# Patient Record
Sex: Female | Born: 1970 | Race: Black or African American | Hispanic: No | Smoking: Former smoker
Health system: Southern US, Community
[De-identification: ages and names within clinical notes are randomized; demographics above are authoritative.]

## PROBLEM LIST (undated history)

## (undated) DIAGNOSIS — E785 Hyperlipidemia, unspecified: Secondary | ICD-10-CM

## (undated) DIAGNOSIS — Z801 Family history of malignant neoplasm of trachea, bronchus and lung: Secondary | ICD-10-CM

## (undated) DIAGNOSIS — R002 Palpitations: Secondary | ICD-10-CM

## (undated) DIAGNOSIS — C50919 Malignant neoplasm of unspecified site of unspecified female breast: Secondary | ICD-10-CM

## (undated) DIAGNOSIS — Z8669 Personal history of other diseases of the nervous system and sense organs: Secondary | ICD-10-CM

## (undated) DIAGNOSIS — Z800 Family history of malignant neoplasm of digestive organs: Secondary | ICD-10-CM

## (undated) DIAGNOSIS — Z803 Family history of malignant neoplasm of breast: Secondary | ICD-10-CM

## (undated) DIAGNOSIS — E669 Obesity, unspecified: Secondary | ICD-10-CM

## (undated) DIAGNOSIS — F32A Depression, unspecified: Secondary | ICD-10-CM

## (undated) DIAGNOSIS — M1990 Unspecified osteoarthritis, unspecified site: Secondary | ICD-10-CM

## (undated) DIAGNOSIS — C50911 Malignant neoplasm of unspecified site of right female breast: Secondary | ICD-10-CM

## (undated) DIAGNOSIS — Z8489 Family history of other specified conditions: Secondary | ICD-10-CM

## (undated) DIAGNOSIS — E119 Type 2 diabetes mellitus without complications: Secondary | ICD-10-CM

## (undated) DIAGNOSIS — T7840XA Allergy, unspecified, initial encounter: Secondary | ICD-10-CM

## (undated) DIAGNOSIS — D649 Anemia, unspecified: Secondary | ICD-10-CM

## (undated) HISTORY — DX: Family history of malignant neoplasm of breast: Z80.3

## (undated) HISTORY — DX: Hyperlipidemia, unspecified: E78.5

## (undated) HISTORY — DX: Allergy, unspecified, initial encounter: T78.40XA

## (undated) HISTORY — DX: Depression, unspecified: F32.A

## (undated) HISTORY — DX: Family history of malignant neoplasm of trachea, bronchus and lung: Z80.1

## (undated) HISTORY — DX: Anemia, unspecified: D64.9

## (undated) HISTORY — DX: Unspecified osteoarthritis, unspecified site: M19.90

## (undated) HISTORY — PX: TUBAL LIGATION: SHX77

## (undated) HISTORY — PX: EYE SURGERY: SHX253

## (undated) HISTORY — DX: Obesity, unspecified: E66.9

## (undated) HISTORY — DX: Malignant neoplasm of unspecified site of unspecified female breast: C50.919

## (undated) HISTORY — DX: Malignant neoplasm of unspecified site of right female breast: C50.911

## (undated) HISTORY — DX: Family history of malignant neoplasm of digestive organs: Z80.0

---

## 1998-05-18 ENCOUNTER — Emergency Department (HOSPITAL_COMMUNITY): Admission: EM | Admit: 1998-05-18 | Discharge: 1998-05-18 | Payer: Self-pay | Admitting: Emergency Medicine

## 1998-07-30 ENCOUNTER — Emergency Department (HOSPITAL_COMMUNITY): Admission: EM | Admit: 1998-07-30 | Discharge: 1998-07-30 | Payer: Self-pay | Admitting: Emergency Medicine

## 1998-09-03 ENCOUNTER — Encounter: Admission: RE | Admit: 1998-09-03 | Discharge: 1998-09-03 | Payer: Self-pay | Admitting: Family Medicine

## 1998-09-15 ENCOUNTER — Encounter: Admission: RE | Admit: 1998-09-15 | Discharge: 1998-09-15 | Payer: Self-pay | Admitting: Family Medicine

## 1998-09-22 ENCOUNTER — Encounter: Admission: RE | Admit: 1998-09-22 | Discharge: 1998-09-22 | Payer: Self-pay | Admitting: Sports Medicine

## 1998-09-28 ENCOUNTER — Encounter: Admission: RE | Admit: 1998-09-28 | Discharge: 1998-09-28 | Payer: Self-pay | Admitting: Sports Medicine

## 1998-10-15 ENCOUNTER — Encounter: Admission: RE | Admit: 1998-10-15 | Discharge: 1998-10-15 | Payer: Self-pay | Admitting: Family Medicine

## 1998-12-06 ENCOUNTER — Encounter: Admission: RE | Admit: 1998-12-06 | Discharge: 1998-12-06 | Payer: Self-pay | Admitting: Family Medicine

## 1999-05-03 ENCOUNTER — Encounter: Admission: RE | Admit: 1999-05-03 | Discharge: 1999-05-03 | Payer: Self-pay | Admitting: Family Medicine

## 1999-05-07 ENCOUNTER — Inpatient Hospital Stay (HOSPITAL_COMMUNITY): Admission: AD | Admit: 1999-05-07 | Discharge: 1999-05-07 | Payer: Self-pay | Admitting: Obstetrics

## 1999-05-09 ENCOUNTER — Encounter: Admission: RE | Admit: 1999-05-09 | Discharge: 1999-05-09 | Payer: Self-pay | Admitting: Family Medicine

## 1999-05-16 ENCOUNTER — Encounter: Admission: RE | Admit: 1999-05-16 | Discharge: 1999-05-16 | Payer: Self-pay | Admitting: Family Medicine

## 1999-05-16 ENCOUNTER — Other Ambulatory Visit: Admission: RE | Admit: 1999-05-16 | Discharge: 1999-05-16 | Payer: Self-pay | Admitting: Family Medicine

## 1999-05-25 ENCOUNTER — Inpatient Hospital Stay (HOSPITAL_COMMUNITY): Admission: AD | Admit: 1999-05-25 | Discharge: 1999-05-25 | Payer: Self-pay | Admitting: Obstetrics

## 1999-06-06 ENCOUNTER — Ambulatory Visit (HOSPITAL_COMMUNITY): Admission: RE | Admit: 1999-06-06 | Discharge: 1999-06-06 | Payer: Self-pay | Admitting: Family Medicine

## 1999-08-29 ENCOUNTER — Inpatient Hospital Stay (HOSPITAL_COMMUNITY): Admission: AD | Admit: 1999-08-29 | Discharge: 1999-08-29 | Payer: Self-pay | Admitting: Obstetrics

## 1999-11-13 ENCOUNTER — Inpatient Hospital Stay (HOSPITAL_COMMUNITY): Admission: AD | Admit: 1999-11-13 | Discharge: 1999-11-13 | Payer: Self-pay | Admitting: Obstetrics

## 1999-11-14 ENCOUNTER — Inpatient Hospital Stay (HOSPITAL_COMMUNITY): Admission: AD | Admit: 1999-11-14 | Discharge: 1999-11-16 | Payer: Self-pay | Admitting: Obstetrics

## 1999-11-14 ENCOUNTER — Encounter (INDEPENDENT_AMBULATORY_CARE_PROVIDER_SITE_OTHER): Payer: Self-pay | Admitting: Specialist

## 2002-07-10 ENCOUNTER — Emergency Department (HOSPITAL_COMMUNITY): Admission: EM | Admit: 2002-07-10 | Discharge: 2002-07-10 | Payer: Self-pay | Admitting: Emergency Medicine

## 2002-07-11 ENCOUNTER — Emergency Department (HOSPITAL_COMMUNITY): Admission: EM | Admit: 2002-07-11 | Discharge: 2002-07-11 | Payer: Self-pay | Admitting: Emergency Medicine

## 2004-08-09 ENCOUNTER — Emergency Department (HOSPITAL_COMMUNITY): Admission: EM | Admit: 2004-08-09 | Discharge: 2004-08-09 | Payer: Self-pay | Admitting: Emergency Medicine

## 2005-10-10 ENCOUNTER — Emergency Department (HOSPITAL_COMMUNITY): Admission: EM | Admit: 2005-10-10 | Discharge: 2005-10-11 | Payer: Self-pay | Admitting: Emergency Medicine

## 2005-10-10 IMAGING — CT CT HEAD W/O CM
1 series · 16 of 30 positions shown, 20 images · IV contrast (agent unspecified)
Comparison: None.

CLINICAL DATA: 34-year-old, headaches, dizziness, right-sided numbness for 4 days. 
 HEAD CT WITHOUT CONTRAST:
TECHNIQUE: Contiguous axial images were obtained from the base of the skull through the vertex according to standard protocol without contrast.

[Series 2: head_seq 4.5 h45s st · axial · 0.43mm/px · z∈[+1015,+1141]mm · 16 of 32 slices shown, 20 images]
[im 2/32  brain]
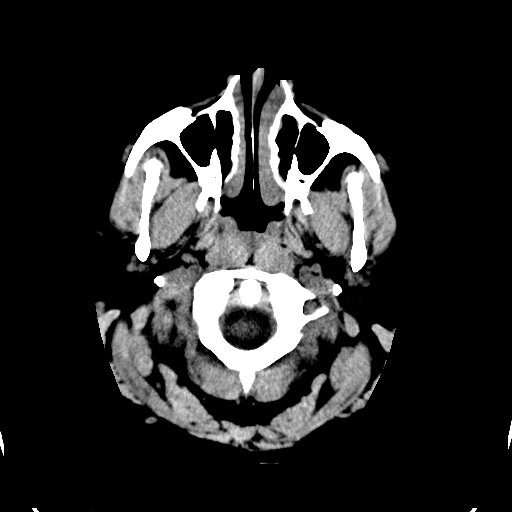
[im 2/32  bone]
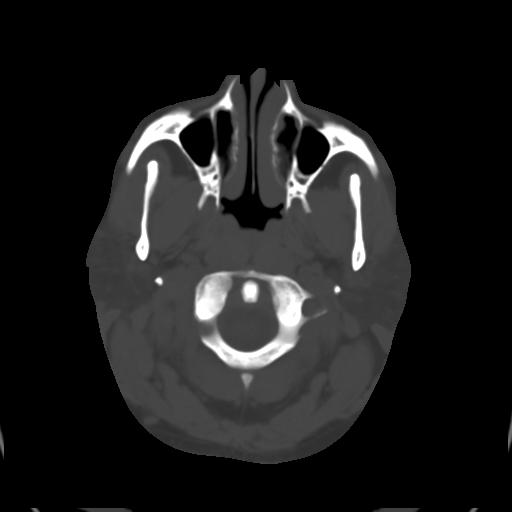
[im 4/32  brain]
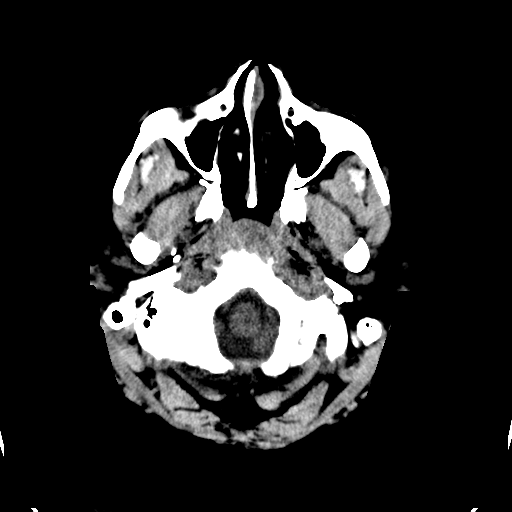
[im 6/32  brain]
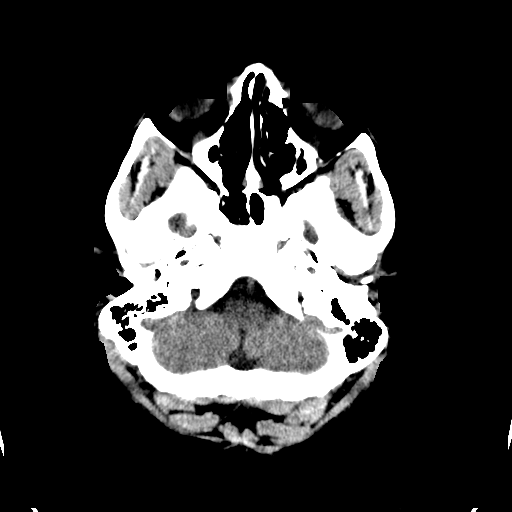
[im 8/32  brain]
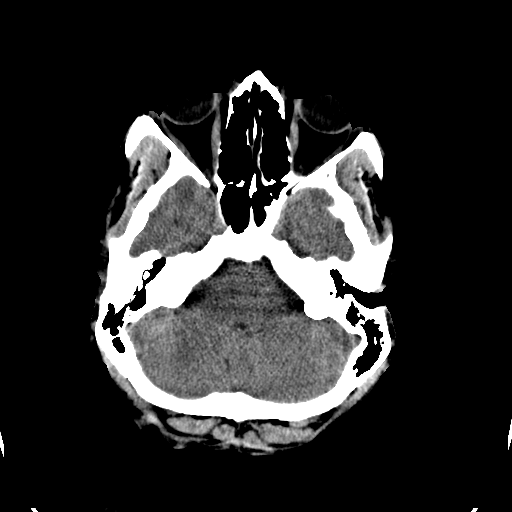
[im 9/32  brain]
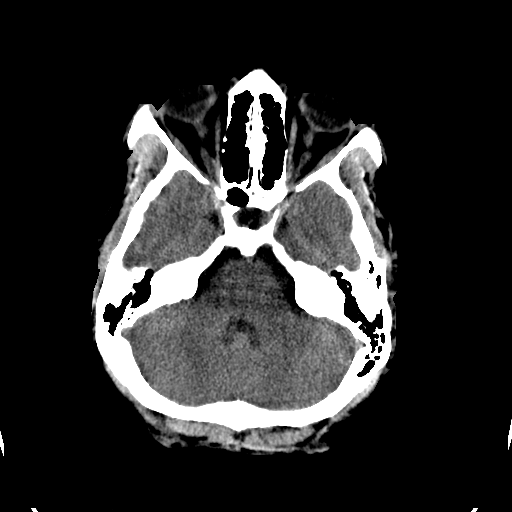
[im 9/32  bone]
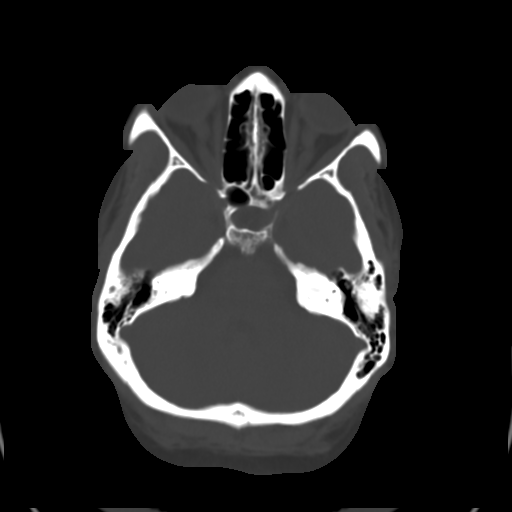
[im 11/32  brain]
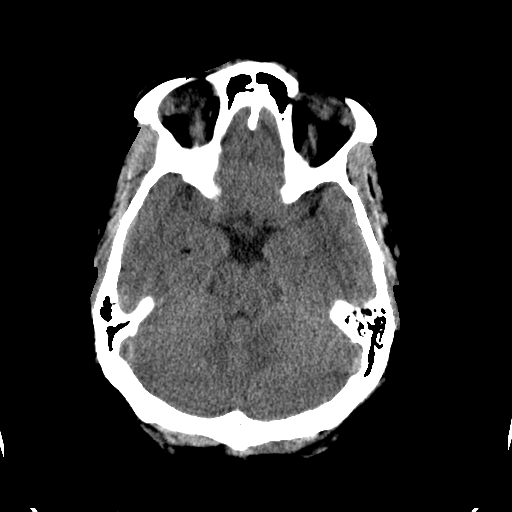
[im 13/32  brain]
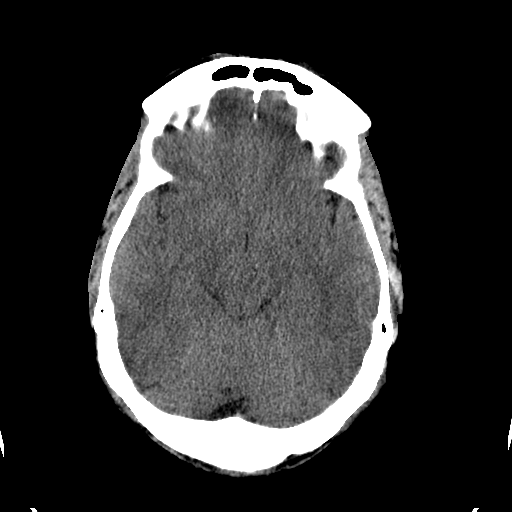
[im 15/32  brain]
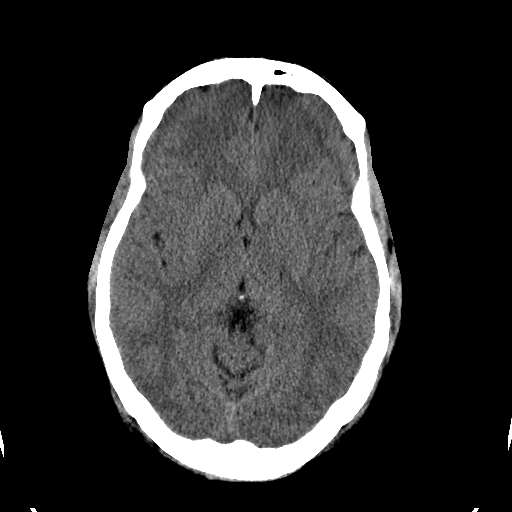
[im 17/32  brain]
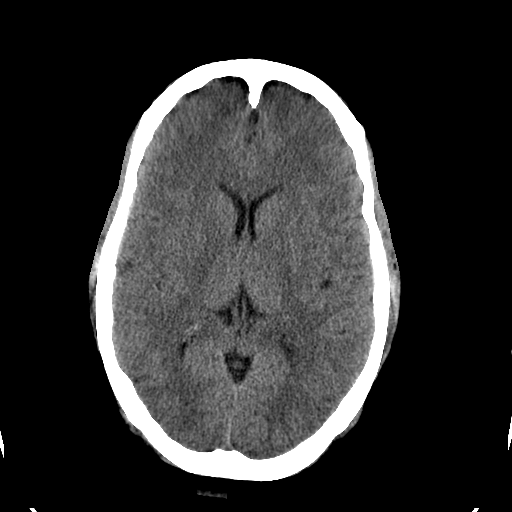
[im 17/32  bone]
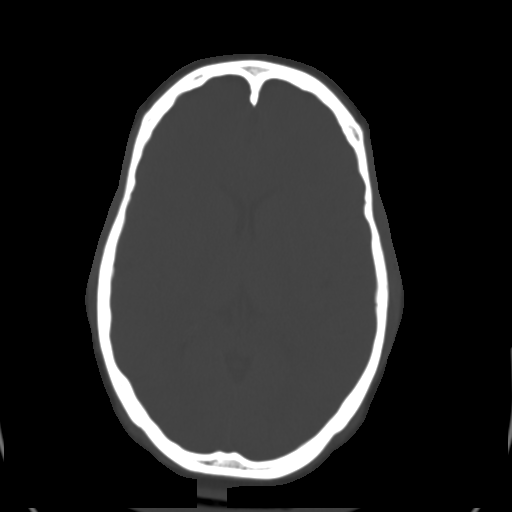
[im 19/32  brain]
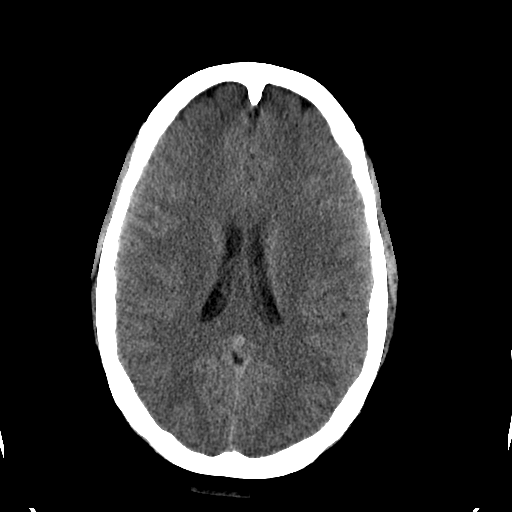
[im 21/32  brain]
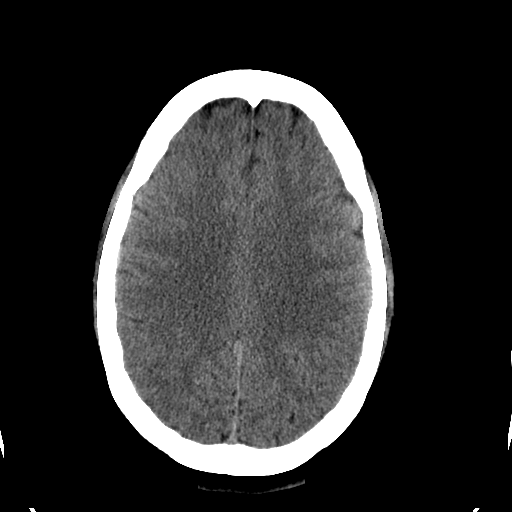
[im 23/32  brain]
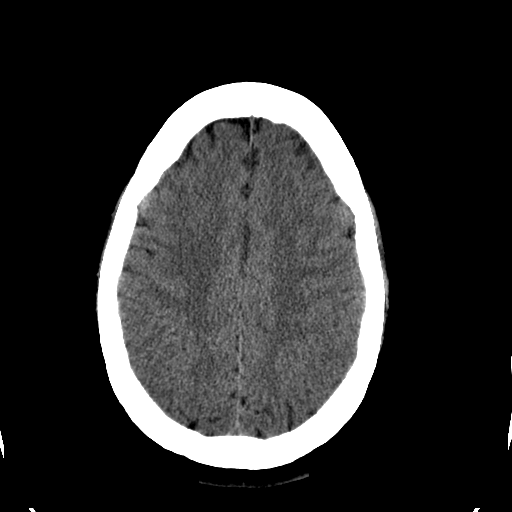
[im 24/32  brain]
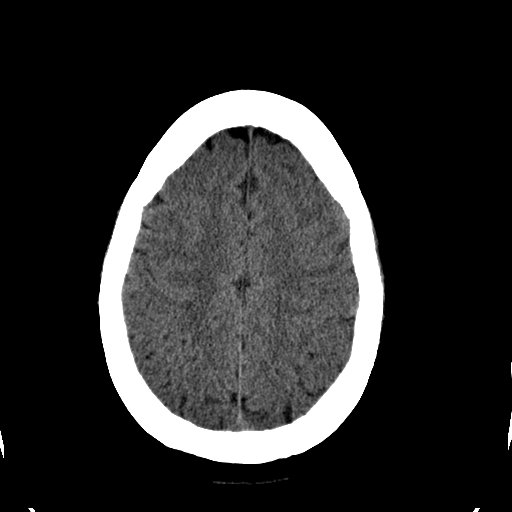
[im 24/32  bone]
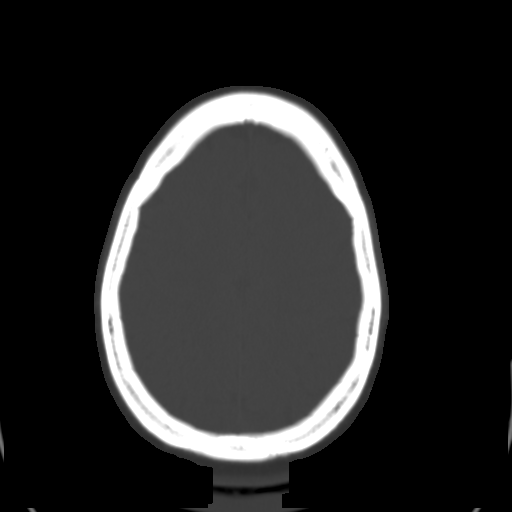
[im 26/32  brain]
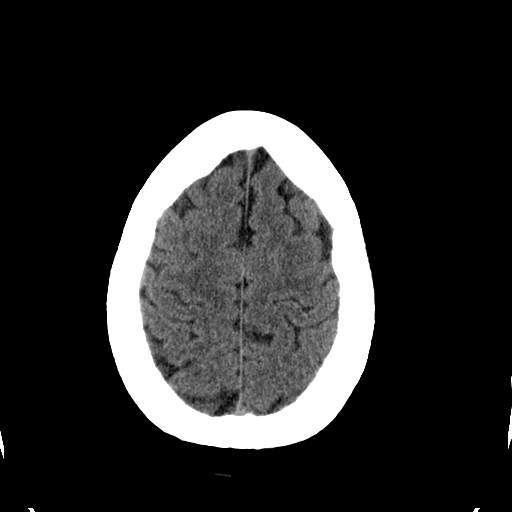
[im 28/32  brain]
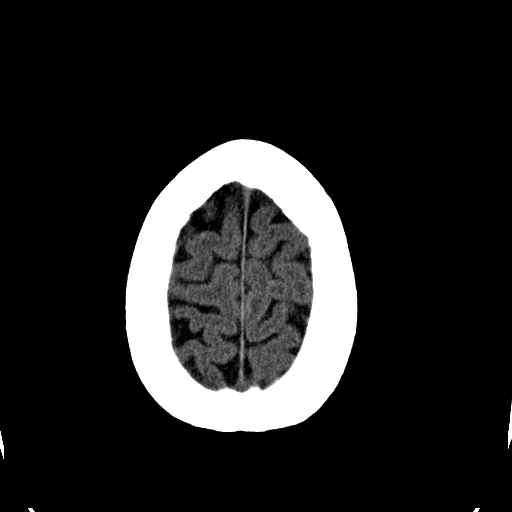
[im 30/32  brain]
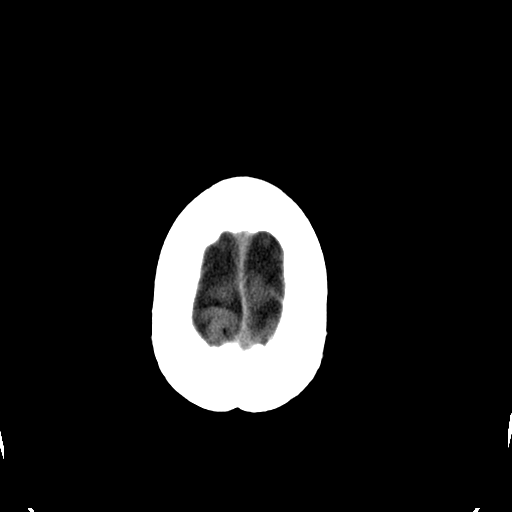

[16 of 30 positions shown; findings below may reference images not displayed]

There is no evidence of intracranial hemorrhage, brain edema, or mass effect.  No other intra-axial abnormalities are seen, and the ventricles are within normal limits.  No abnormal extra-axial fluid collections or masses are identified.  No skull abnormalities are noted.
IMPRESSION: Negative non-contrast head CT.

## 2007-05-07 ENCOUNTER — Emergency Department (HOSPITAL_COMMUNITY): Admission: EM | Admit: 2007-05-07 | Discharge: 2007-05-07 | Payer: Self-pay | Admitting: Emergency Medicine

## 2007-10-14 ENCOUNTER — Emergency Department (HOSPITAL_COMMUNITY): Admission: EM | Admit: 2007-10-14 | Discharge: 2007-10-14 | Payer: Self-pay | Admitting: Emergency Medicine

## 2007-10-14 IMAGING — CT CT ANGIO CHEST
2 of 4 series · 19 of 36 positions shown · IV contrast (agent unspecified)
Comparison: [DATE]

CLINICAL DATA: Chest pain.  Back pain.  Elevated D-dimer.

CT ANGIOGRAPHY CHEST
TECHNIQUE: Multidetector CT imaging of the chest using the
standard protocol during bolus administration of intravenous
contrast. Multiplanar reconstructed images obtained and reviewed to
evaluate the vascular anatomy.
Contrast: 80 ml [4C]

[Series 8: pe 1.0 b40f thins for pacs · axial · 0.57mm/px · z∈[+1174,+1356]mm · 16 of 205 slices shown]
[im 11/205  lung]
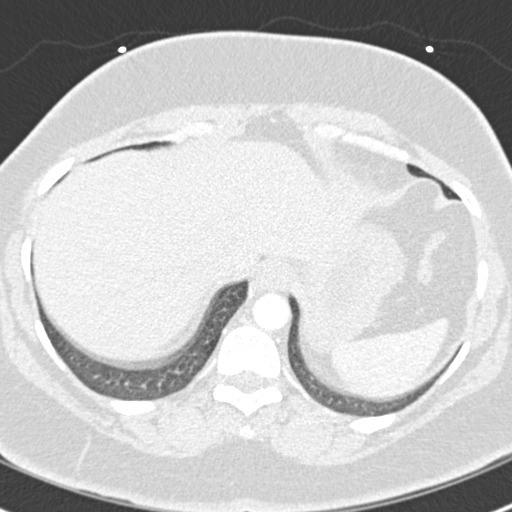
[im 21/205  mediastinal]
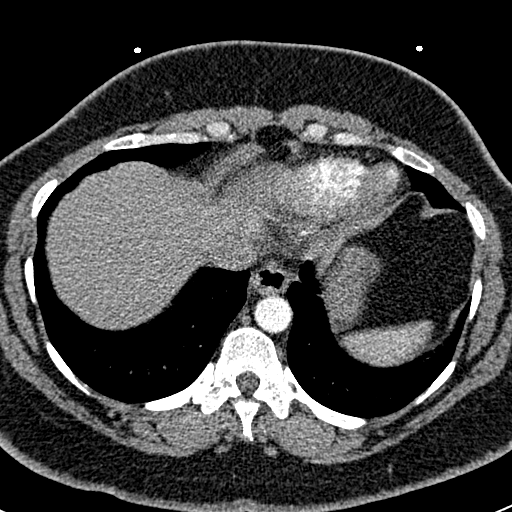
[im 31/205  lung]
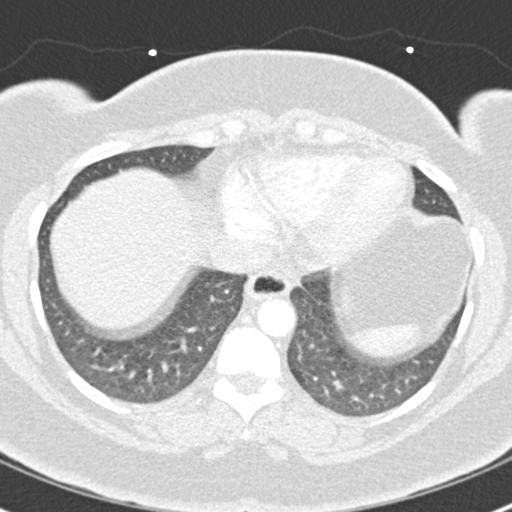
[im 52/205  mediastinal]
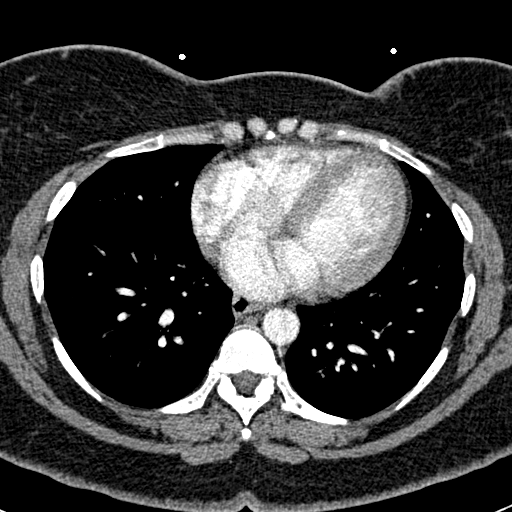
[im 62/205  lung]
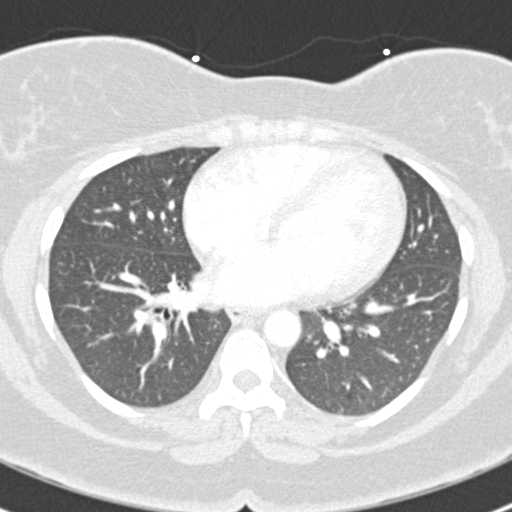
[im 72/205  mediastinal]
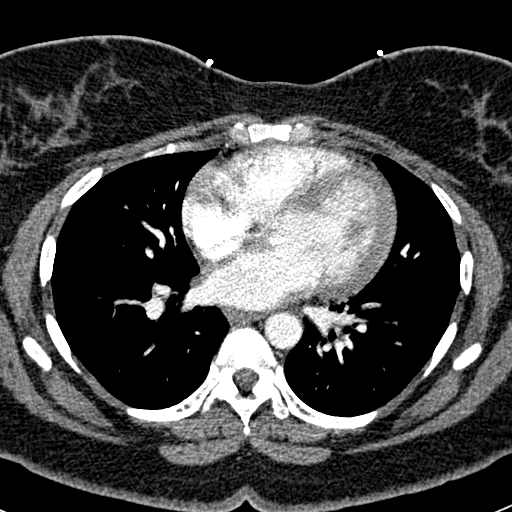
[im 82/205  lung]
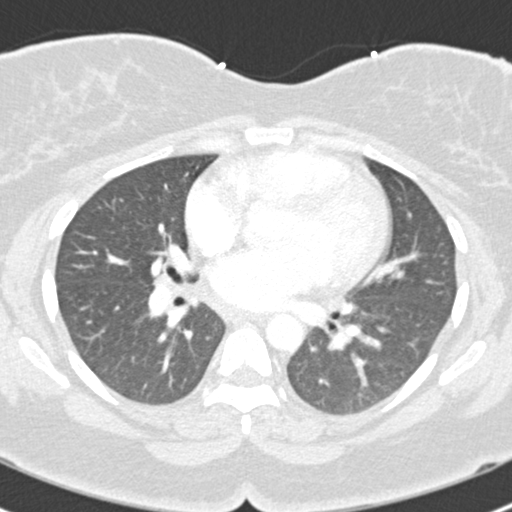
[im 92/205  mediastinal]
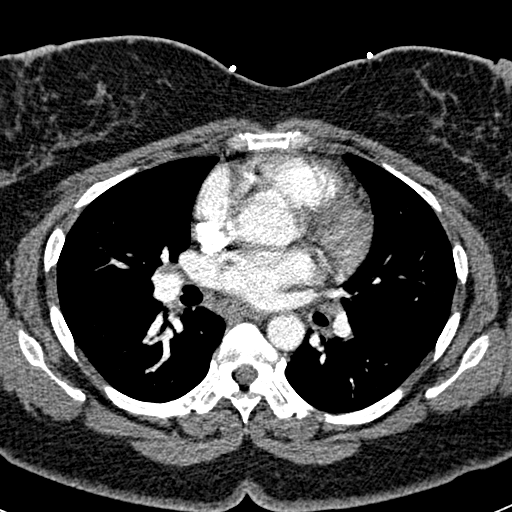
[im 113/205  lung]
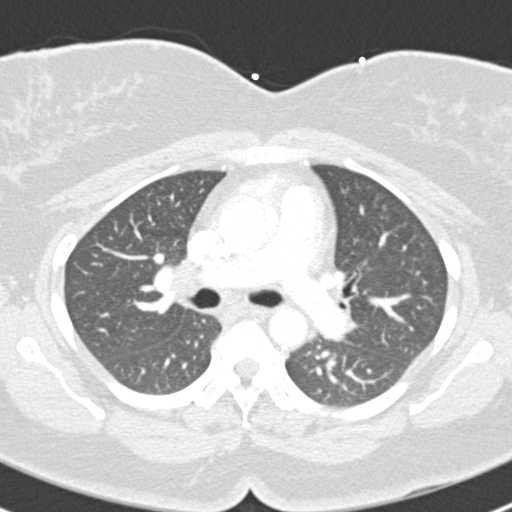
[im 123/205  mediastinal]
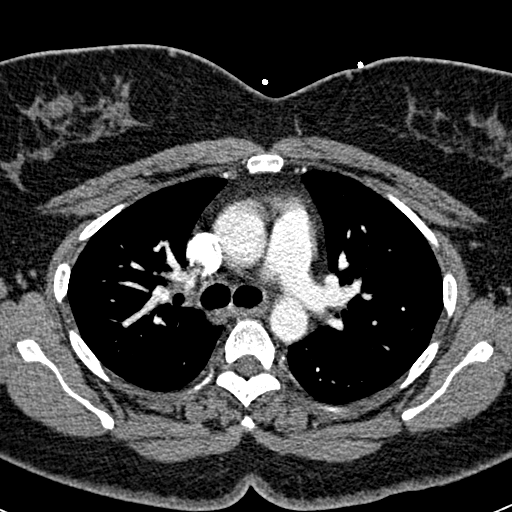
[im 133/205  lung]
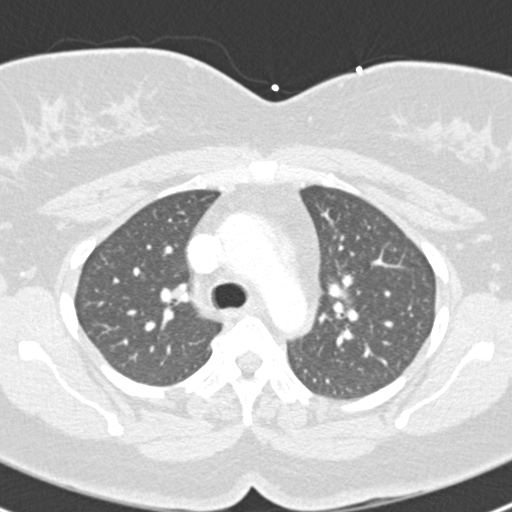
[im 143/205  mediastinal]
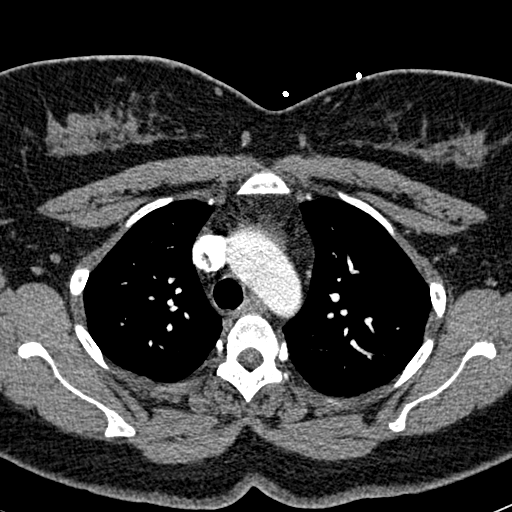
[im 154/205  lung]
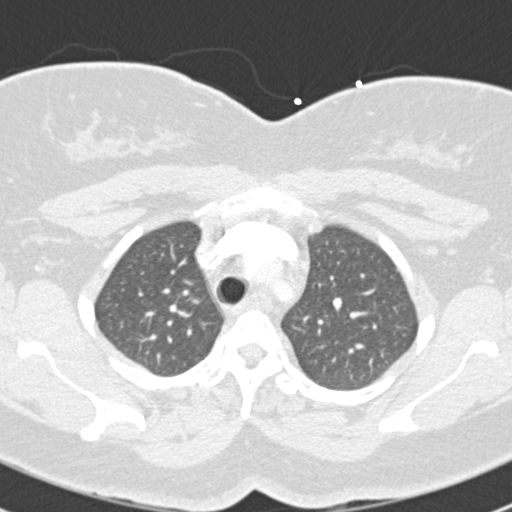
[im 174/205  mediastinal]
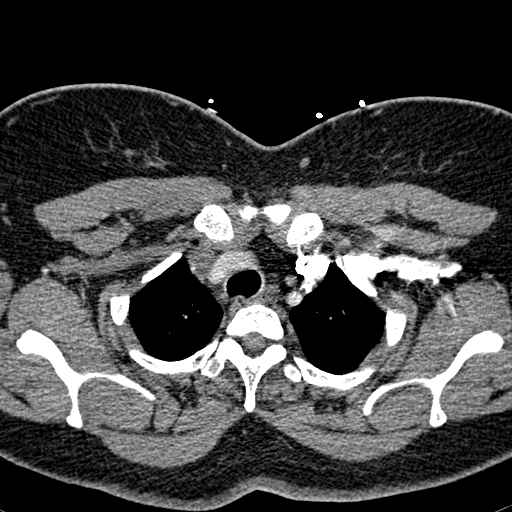
[im 184/205  lung]
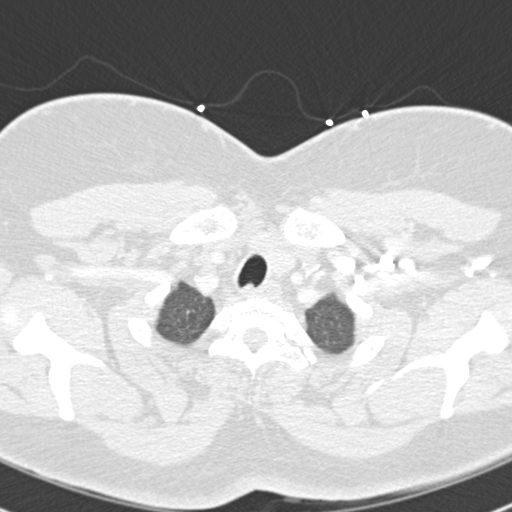
[im 194/205  mediastinal]
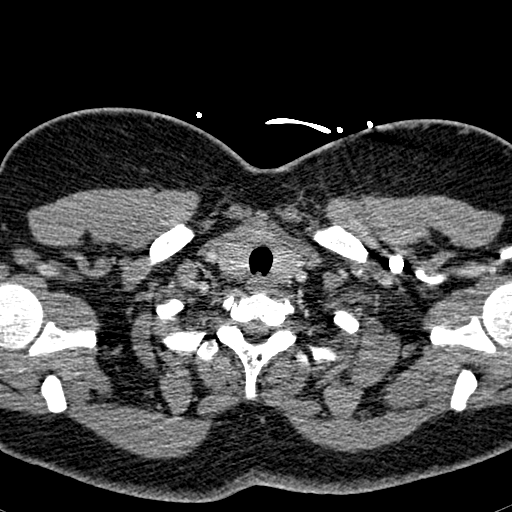

[Series 602: <mpr thick range> · coronal · 0.57mm/px · 3 of 83 slices shown]
[im 17/83  mediastinal]
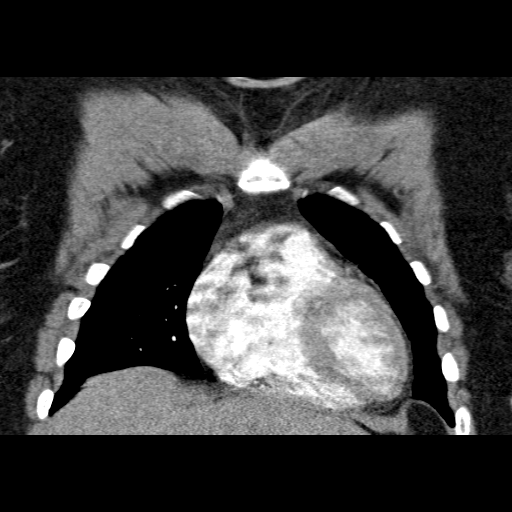
[im 33/83  mediastinal]
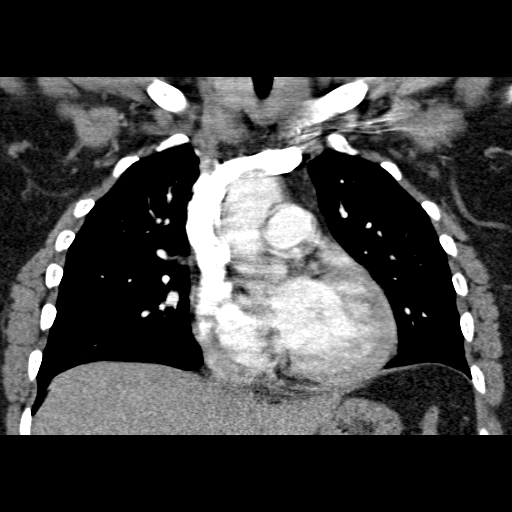
[im 50/83  mediastinal]
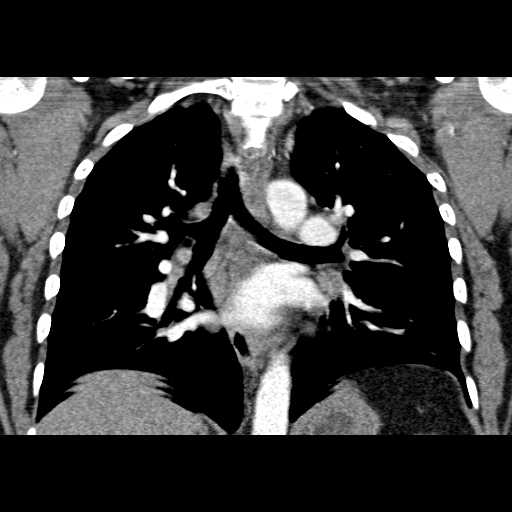

[19 of 36 positions shown; findings below may reference images not displayed]

FINDINGS: No filling defect is identified in the pulmonary arterial
tree to suggest pulmonary embolus.  The thoracic inlet appears
unremarkable.  No pathologic adenopathy identified.  The lungs
appear otherwise clear.  The thoracic spine appears unremarkable.
The sternum appears normal.  The thoracic aorta and branch vessels
appear unremarkable.
IMPRESSION: 1.  No embolus identified.

## 2007-10-14 IMAGING — CR DG CHEST 2V
2 series · 2 of 2 positions shown · non-contrast
Comparison: None

CLINICAL DATA: *Chest pain;

CHEST - 2 VIEW

[w chest pa]
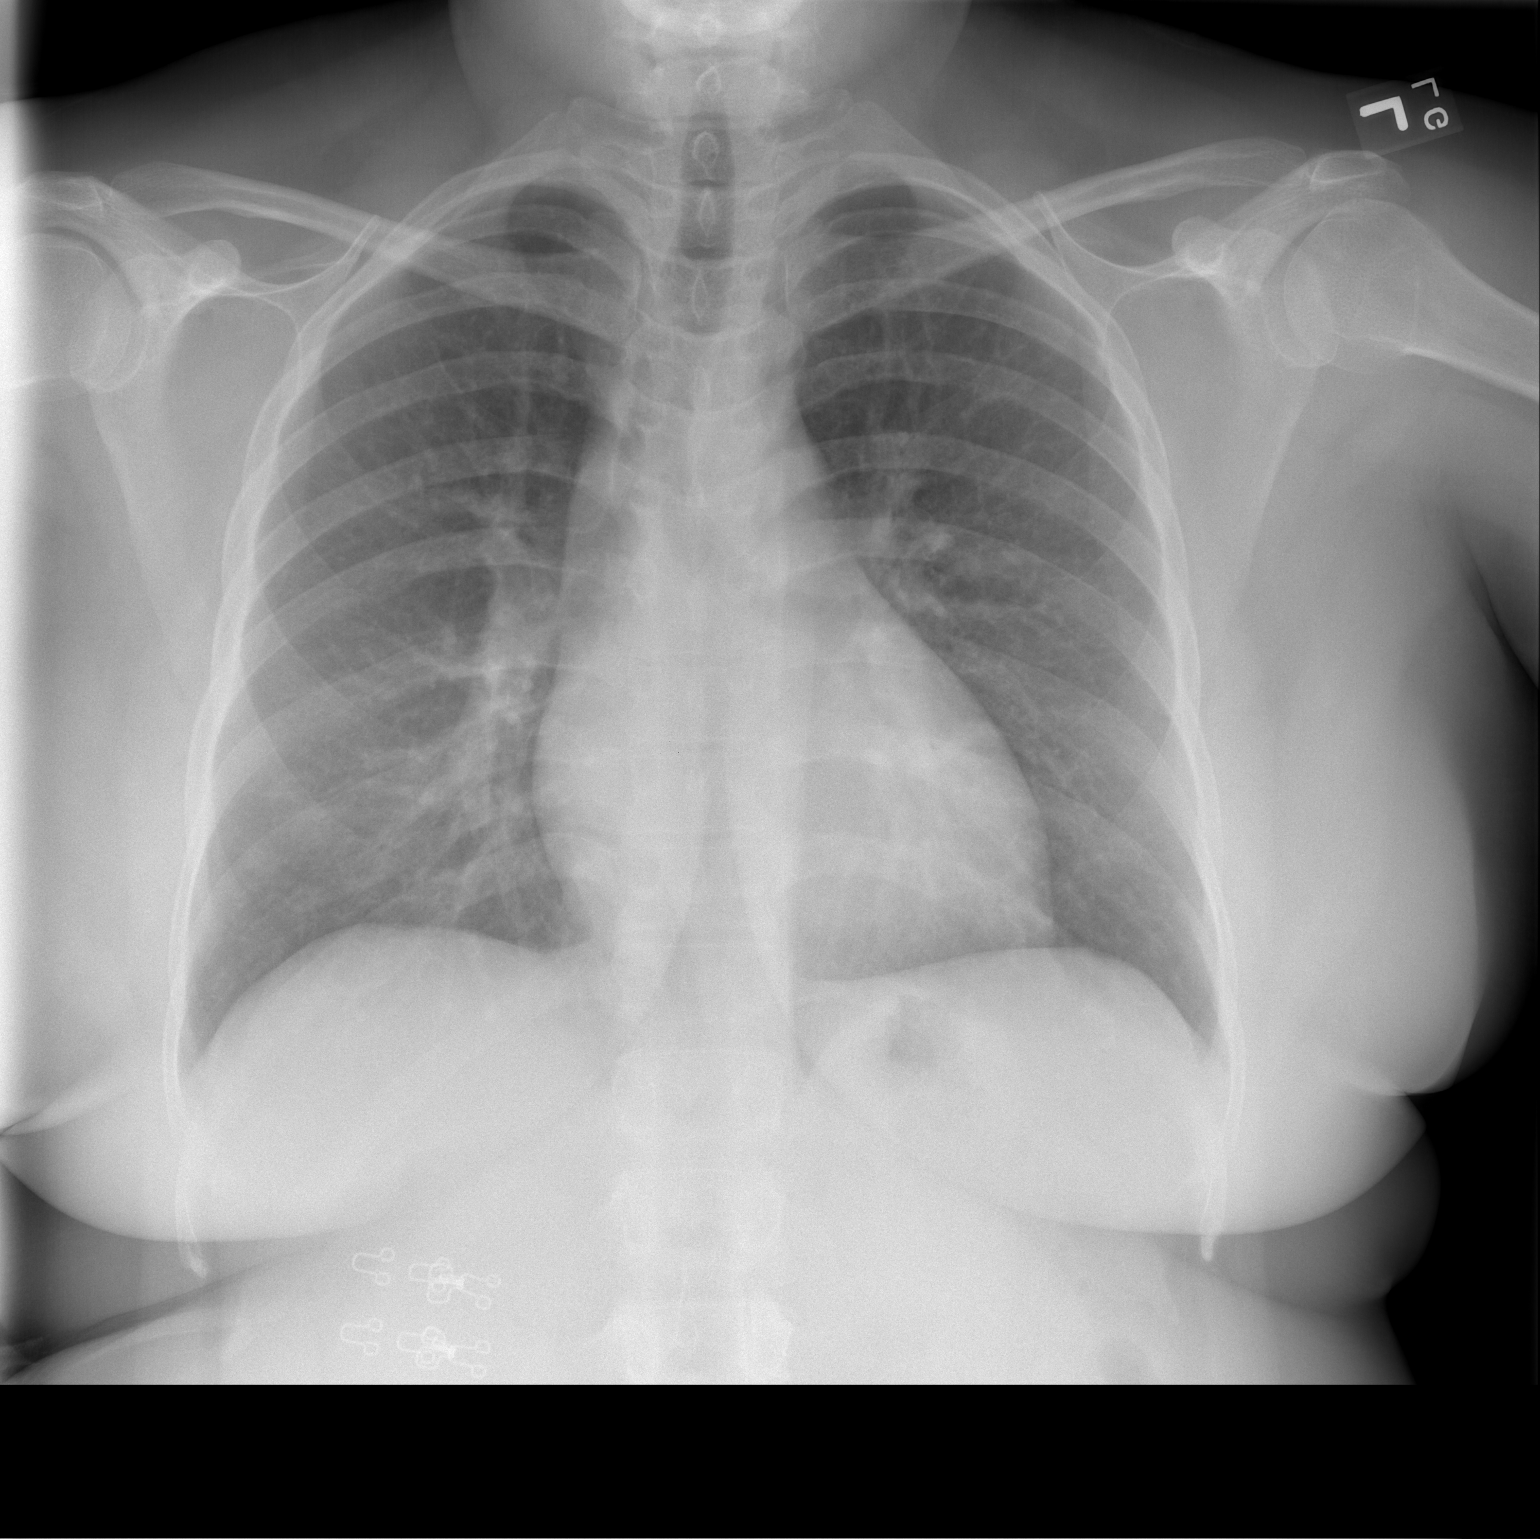

[w chest lat]
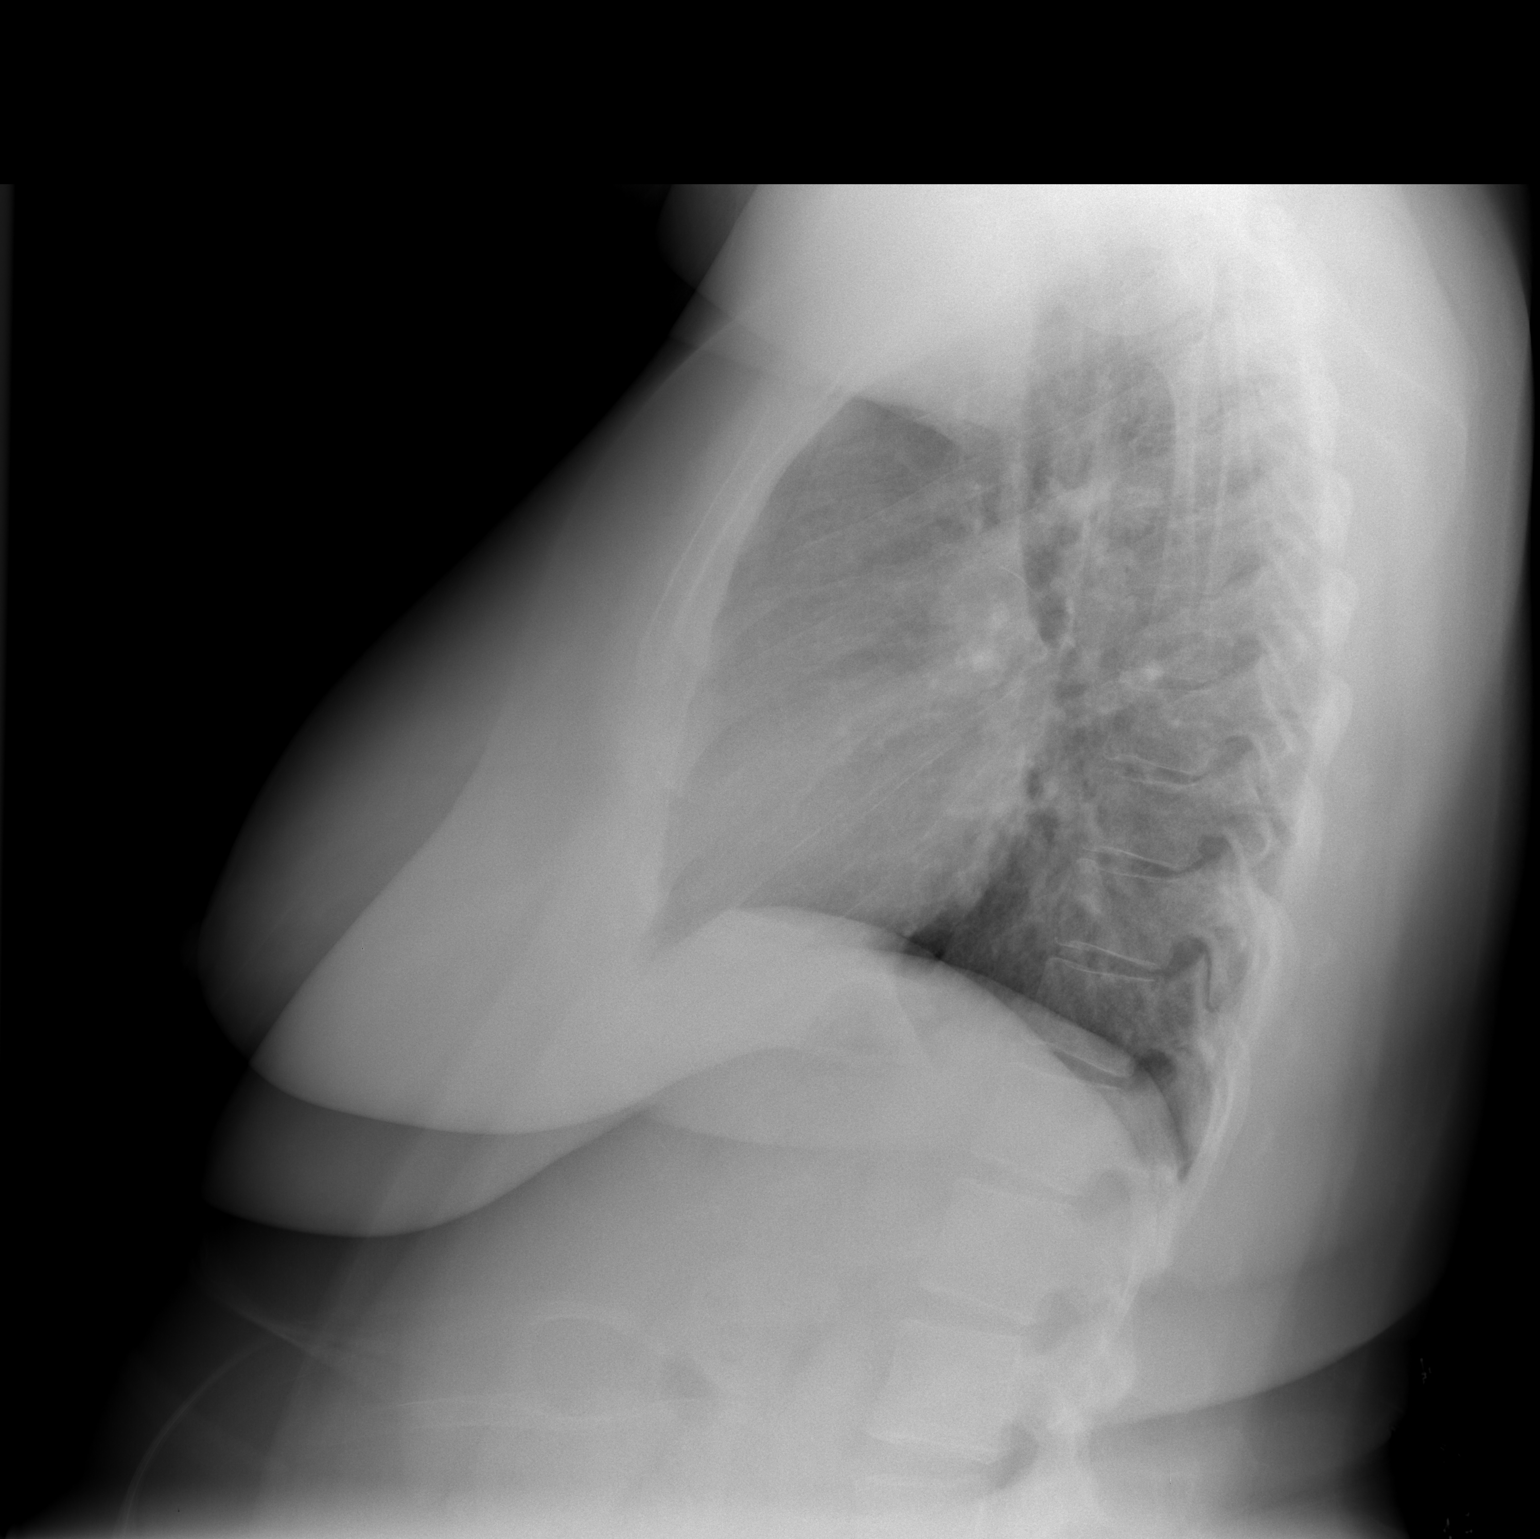

[2 of 2 positions shown; findings below may reference images not displayed]

FINDINGS: The heart size and mediastinal contours are within normal
limits.  Both lungs are clear.  The visualized skeletal structures
are unremarkable.
IMPRESSION: No active cardiopulmonary disease.

## 2008-12-18 ENCOUNTER — Emergency Department (HOSPITAL_COMMUNITY): Admission: EM | Admit: 2008-12-18 | Discharge: 2008-12-18 | Payer: Self-pay | Admitting: Family Medicine

## 2010-10-24 LAB — POCT URINALYSIS DIP (DEVICE)
Glucose, UA: NEGATIVE mg/dL
Hgb urine dipstick: NEGATIVE
Ketones, ur: NEGATIVE mg/dL
Protein, ur: 30 mg/dL — AB
Specific Gravity, Urine: >=1.03 (ref 1.005–1.030)
pH: 5 (ref 5.0–8.0)

## 2010-12-02 NOTE — Discharge Summary (Signed)
[facility]  Patient:    Sandra Bishop, Sandra Bishop                       MRN: [id number] Adm. Date:  [id number] Disc. Date: [id number] Attending:  Venita Sheffield                           Discharge Summary  HISTORY OF PRESENT ILLNESS:   The patient is a 40 year old, gravida 3, para 1-0-1-1, [facility] Dec 11, 1999.  She was admitted in labor at 5 cm dilated, 90%, with vertex at 0 station.  She had not had her Beta Strep done and missed her last appointment.  HOSPITAL COURSE:              Ampicillin 2 grams IV q.6h. was given.  She progressed rapidly and had a normal vaginal delivery of a female, weighing 4 pounds 11 ounces.  The team was in attendance.  There was a nuchal cord x 1. Postpartum, the patient desired sterilization and on May 1, she underwent postpartum tubal ligation.  Her hemoglobin on April 30, was 12.3 and on May 1, reported as 12.4.  Postoperative she did well and was discharged home on the second postpartum day  ambulatory, on a regular diet, on Tylenol No.3 one q.4h. p.r.n. for pain.  DISCHARGE DIAGNOSIS:          Status post normal vaginal delivery at 36 weeks. DD:  11/16/99 TD:  11/17/99 Job: [id number]

## 2011-04-10 LAB — CBC
HCT: 35.8 — ABNORMAL LOW
MCHC: 34.4
MCV: 83.9
RBC: 4.27
WBC: 10.9 — ABNORMAL HIGH

## 2011-04-10 LAB — URINALYSIS, ROUTINE W REFLEX MICROSCOPIC
Glucose, UA: NEGATIVE
Ketones, ur: NEGATIVE
Nitrite: NEGATIVE
Protein, ur: NEGATIVE
Urobilinogen, UA: 0.2

## 2011-04-10 LAB — POCT CARDIAC MARKERS
CKMB, poc: 1.1
Troponin i, poc: <0.05

## 2011-04-10 LAB — DIFFERENTIAL
Eosinophils Absolute: 0.2
Monocytes Relative: 9

## 2011-04-10 LAB — D-DIMER, QUANTITATIVE: D-Dimer, Quant: 0.72 — ABNORMAL HIGH

## 2011-04-10 LAB — URINE MICROSCOPIC-ADD ON

## 2011-04-26 LAB — POCT RAPID STREP A: Streptococcus, Group A Screen (Direct): POSITIVE — AB

## 2011-08-30 ENCOUNTER — Ambulatory Visit (INDEPENDENT_AMBULATORY_CARE_PROVIDER_SITE_OTHER): Payer: BC Managed Care – PPO | Admitting: Internal Medicine

## 2011-08-30 ENCOUNTER — Ambulatory Visit: Payer: BC Managed Care – PPO

## 2011-08-30 DIAGNOSIS — R1012 Left upper quadrant pain: Secondary | ICD-10-CM

## 2011-08-30 DIAGNOSIS — K5900 Constipation, unspecified: Secondary | ICD-10-CM | POA: Insufficient documentation

## 2011-08-30 DIAGNOSIS — R1013 Epigastric pain: Secondary | ICD-10-CM

## 2011-08-30 LAB — COMPREHENSIVE METABOLIC PANEL
ALT: 13 U/L (ref 0–35)
Albumin: 4.2 g/dL (ref 3.5–5.2)
Calcium: 9.2 mg/dL (ref 8.4–10.5)
Chloride: 107 mEq/L (ref 96–112)
Glucose, Bld: 89 mg/dL (ref 70–99)
Potassium: 4.2 mEq/L (ref 3.5–5.3)
Sodium: 140 mEq/L (ref 135–145)
Total Bilirubin: 0.4 mg/dL (ref 0.3–1.2)
Total Protein: 7.8 g/dL (ref 6.0–8.3)

## 2011-08-30 LAB — POCT CBC
MCH, POC: 26 pg — AB (ref 27–31.2)
MID (cbc): 0.5 (ref 0–0.9)
MPV: 8.7 fL (ref 0–99.8)
POC Granulocyte: 3.5 (ref 2–6.9)
POC LYMPH PERCENT: 46.7 %L (ref 10–50)
POC MID %: 7 %M (ref 0–12)
Platelet Count, POC: 288 10*3/uL (ref 142–424)
RDW, POC: 15.3 %
WBC: 7.6 10*3/uL (ref 4.6–10.2)

## 2011-08-30 LAB — POCT URINALYSIS DIPSTICK
Blood, UA: NEGATIVE
Spec Grav, UA: 1.025
pH, UA: 5.5

## 2011-08-30 LAB — POCT UA - MICROSCOPIC ONLY
Casts, Ur, LPF, POC: NEGATIVE
Yeast, UA: NEGATIVE

## 2011-08-30 LAB — TSH: TSH: 0.498 u[IU]/mL (ref 0.350–4.500)

## 2011-08-30 LAB — LIPID PANEL
HDL: 45 mg/dL (ref >39)
LDL Cholesterol: 44 mg/dL (ref 0–99)
Triglycerides: 46 mg/dL (ref <150)
VLDL: 9 mg/dL (ref 0–40)

## 2011-08-30 LAB — LIPASE: Lipase: 134 U/L — ABNORMAL HIGH (ref 0–75)

## 2011-08-30 IMAGING — CR DG ABDOMEN ACUTE W/ 1V CHEST
3 series · 3 of 3 positions shown · non-contrast
Comparison: Chest x-ray [DATE]

CLINICAL DATA: abdominal pain

ACUTE ABDOMEN SERIES (ABDOMEN 2 VIEW & CHEST 1 VIEW)

[PA]
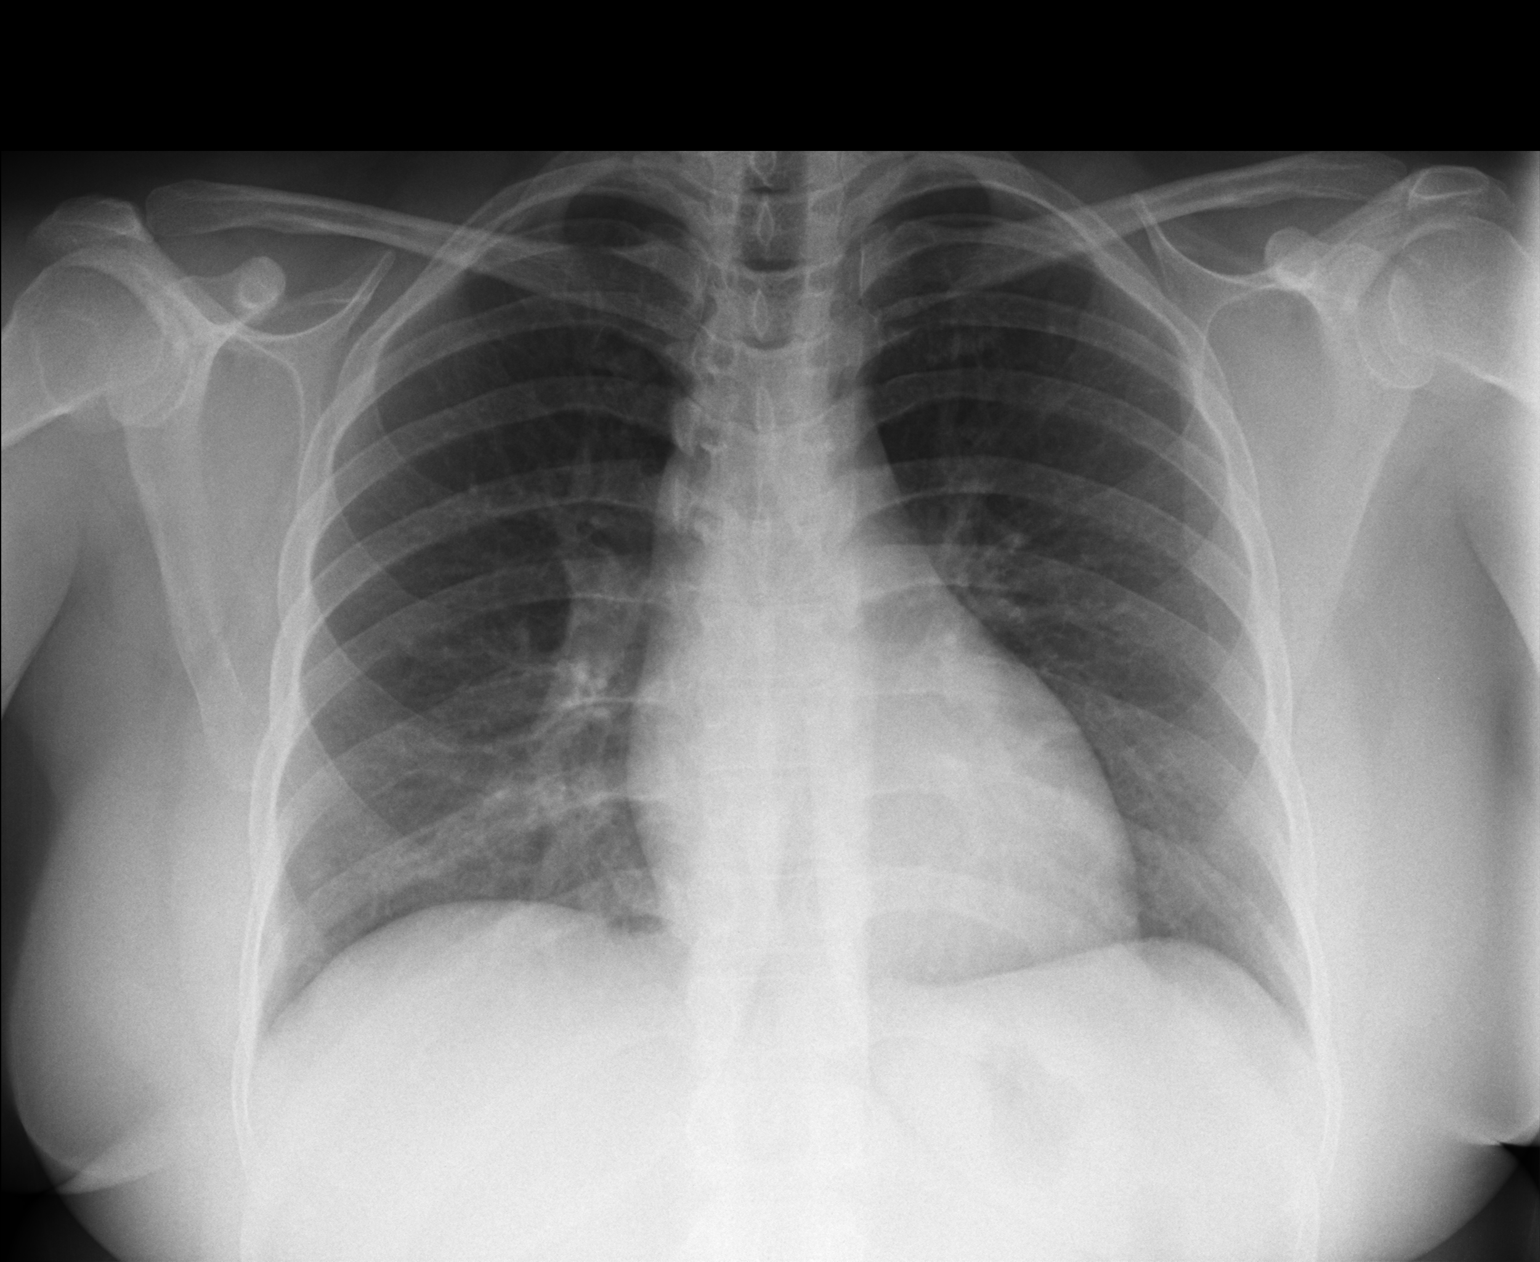

[AP (1 of 2)]
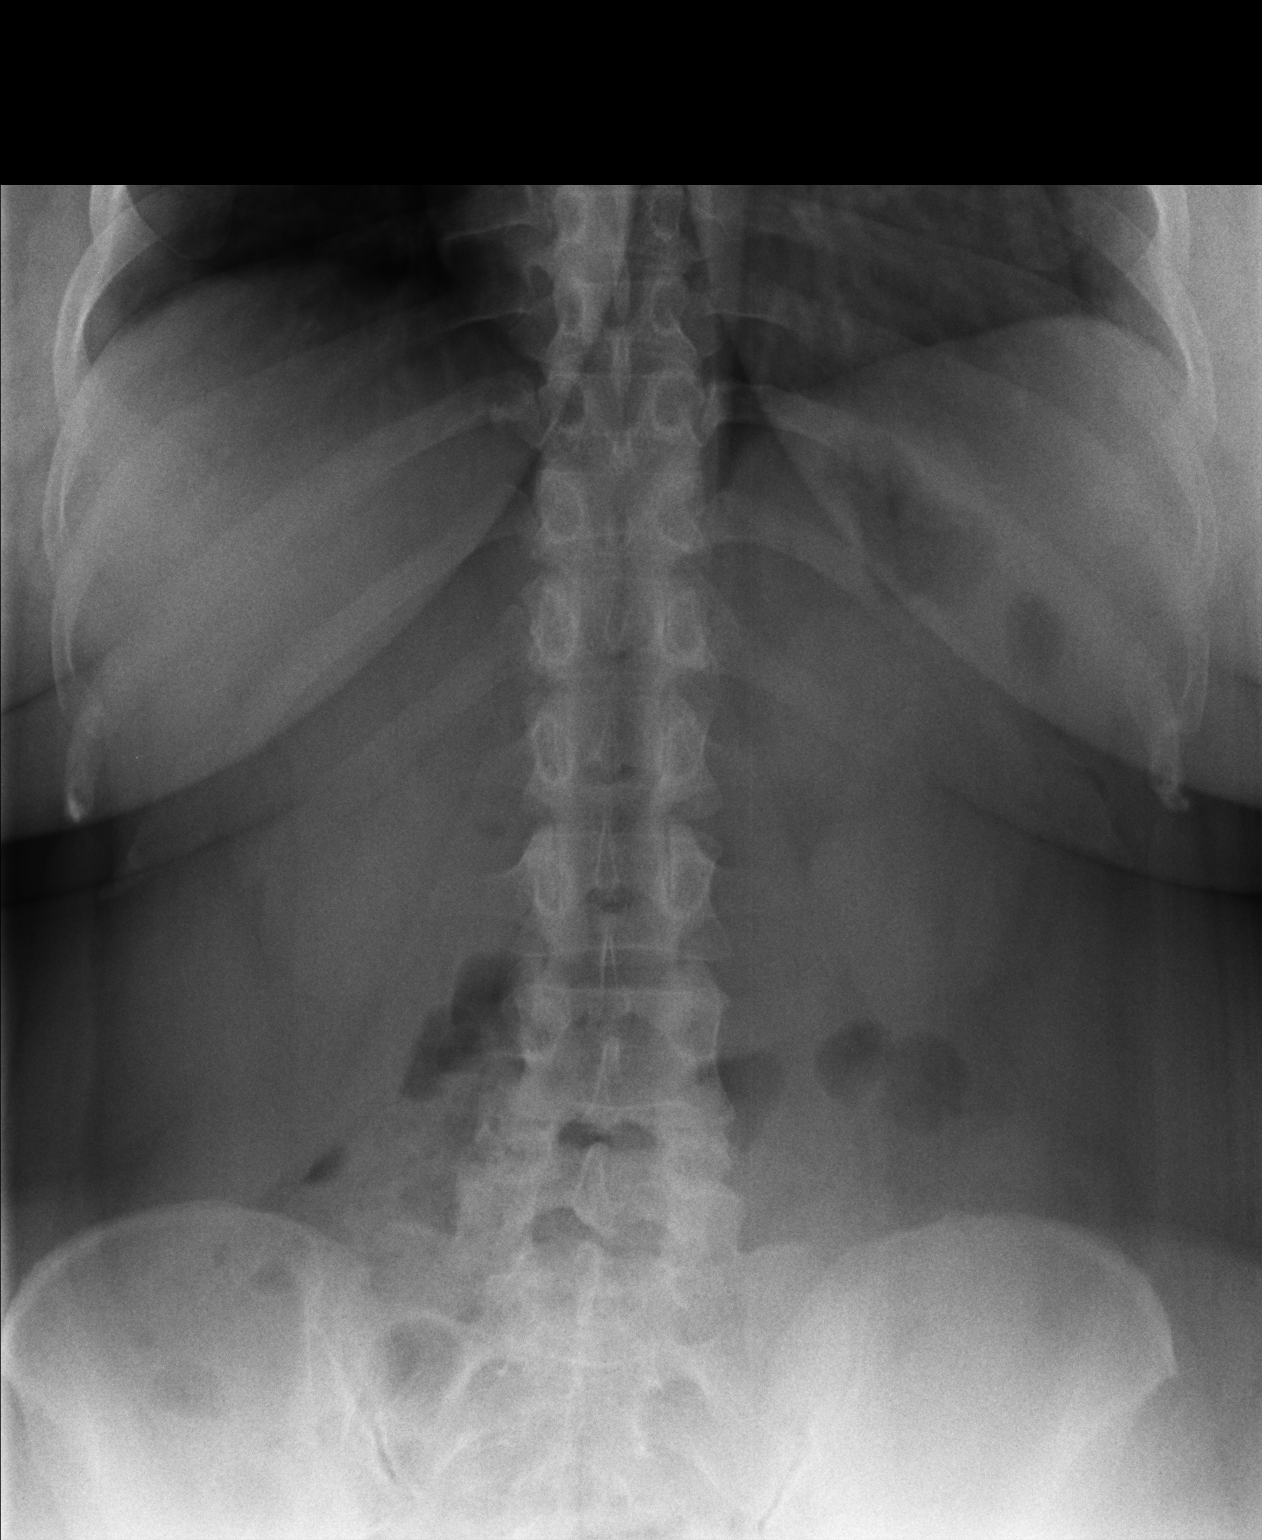

[AP (2 of 2)]
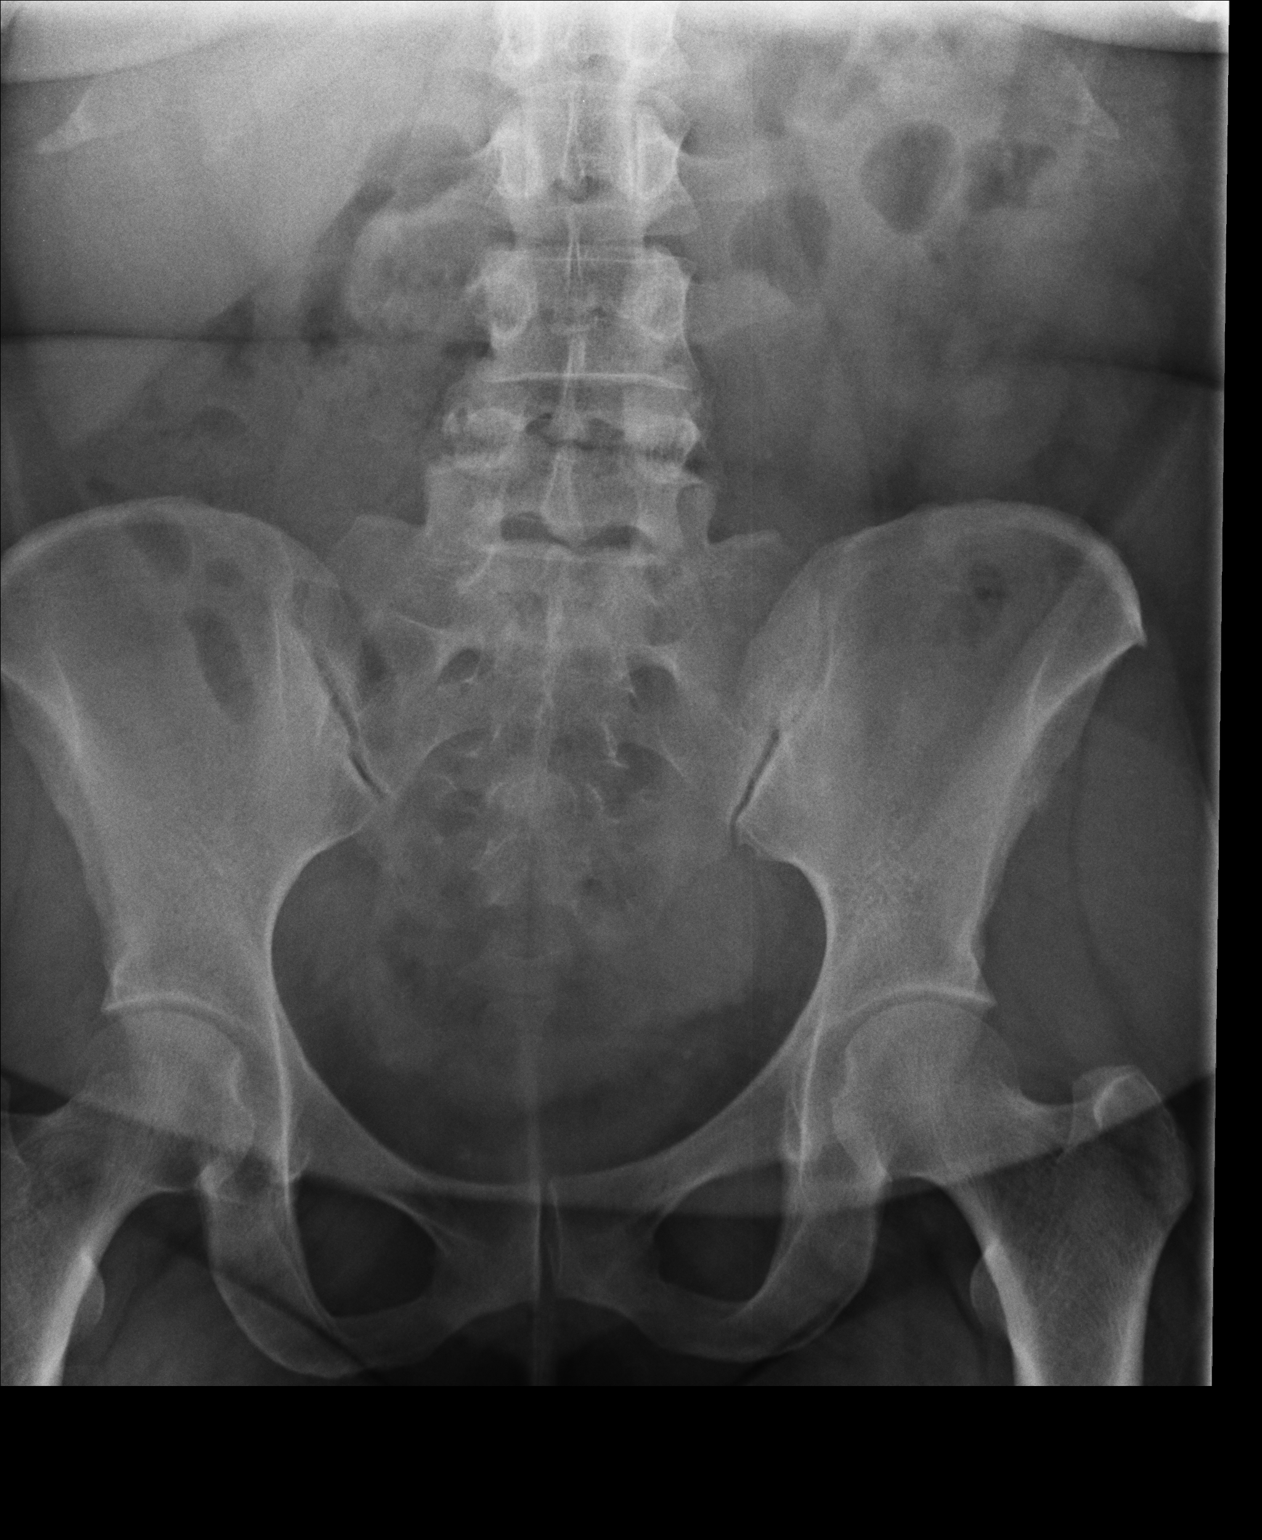

[3 of 3 positions shown; findings below may reference images not displayed]

FINDINGS: Normal heart size and normal vascularity.  Lungs are
clear.

Normal bowel gas pattern.  Negative for bowel obstruction or free
intraperitoneal gas.  No renal calculi.  No acute bony abnormality.
IMPRESSION: Negative

## 2011-08-30 MED ORDER — POLYETHYLENE GLYCOL 3350 17 GM/SCOOP PO POWD: 17.0000 g | Freq: Two times a day (BID) | ORAL | Status: DC | PRN

## 2011-08-30 NOTE — Progress Notes (Signed)
Subjective:    Patient ID: Sandra Bishop, female    DOB: 07-01-1971, 41 y.o.   MRN: [id number]  HPI C/o recurrent mid and left upper quadrant abdominal pain. Has associated bloating, belching, and gas. No fever, vomiting, diarrhea, anorexia, fatigue.  Also hx of anemia, fhx of DM.   Review of Systems stable    Objective:   Physical Exam  Constitutional: She is oriented to person, place, and time. Vital signs are normal. She appears well-nourished.  Non-toxic appearance. She does not have a sickly appearance.       Obese but losing.  Eyes: EOM are normal.  Neck: Neck supple.  Cardiovascular: Normal rate, regular rhythm and normal heart sounds.   Pulmonary/Chest: Effort normal and breath sounds normal.  Abdominal: Soft. Bowel sounds are normal. She exhibits no distension and no mass. There is tenderness. There is no rebound and no guarding.  Neurological: She is alert and oriented to person, place, and time.  Skin: Skin is warm and dry.  Psychiatric: She has a normal mood and affect.   Results for orders placed in visit on 08/30/11  POCT CBC      Component Value Range   WBC 7.6  4.6 - 10.2 (K/uL)   Lymph, poc 3.5 (*) 0.6 - 3.4    POC LYMPH PERCENT 46.7  10 - 50 (%L)   MID (cbc) 0.5  0 - 0.9    POC MID % 7.0  0 - 12 (%M)   POC Granulocyte 3.5  2 - 6.9    Granulocyte percent 46.3  37 - 80 (%G)   RBC 4.35  4.04 - 5.48 (M/uL)   Hemoglobin 11.3 (*) 12.2 - 16.2 (g/dL)   HCT, POC 40.9 (*) 81.1 - 47.9 (%)   MCV 83.4  80 - 97 (fL)   MCH, POC 26.0 (*) 27 - 31.2 (pg)   MCHC 31.1 (*) 31.8 - 35.4 (g/dL)   RDW, POC 91.4     Platelet Count, POC 288  142 - 424 (K/uL)   MPV 8.7  0 - 99.8 (fL)  POCT URINALYSIS DIPSTICK      Component Value Range   Color, UA yellow     Clarity, UA slightly cloudy     Glucose, UA negative     Bilirubin, UA negative     Ketones, UA negative     Spec Grav, UA 1.025     Blood, UA negative     pH, UA 5.5     Protein, UA negative     Urobilinogen,  UA 0.2     Nitrite, UA negative     Leukocytes, UA Trace    POCT UA - MICROSCOPIC ONLY      Component Value Range   WBC, Ur, HPF, POC 0-1     RBC, urine, microscopic negative     Bacteria, U Microscopic trace     Mucus, UA negative     Epithelial cells, urine per micros 3-5     Crystals, Ur, HPF, POC negative     Casts, Ur, LPF, POC negative     Yeast, UA negative    GLUCOSE, POCT (MANUAL RESULT ENTRY)      Component Value Range   POC Glucose 102    POCT GLYCOSYLATED HEMOGLOBIN (HGB A1C)      Component Value Range   Hemoglobin A1C 5.9    hisvisit  [facility] reading (PRIMARY) by  Dr. Perrin Maltese  AAS NAD.       Assessment &  Plan:  Mid epig. And LUQ abdom. Pain  Chronic  CBC,CCUA,CMET,TSH,LIPIDS, A1C, glucose AAS  Constipation  Trial of miralax, diet fiber Instructions given F/up next week at scheduled CPE, sooner prn

## 2011-08-31 ENCOUNTER — Telehealth: Payer: Self-pay

## 2011-08-31 NOTE — Telephone Encounter (Signed)
.  [facility] PT STATES SHE WAS GIVEN SOME MEDICINE FOR CONSTIPATION AND IT HAS BEEN 2 DAYS AND STILL HASN'T GONE TO THE BATHROOM PLEASE CALL [phone]    [facility] ON [address]

## 2011-09-01 ENCOUNTER — Telehealth: Payer: Self-pay

## 2011-09-01 NOTE — Telephone Encounter (Signed)
Gave pt info from [facility] and pt stated that the MD had already told her to RTC tomorrow for some other bloodwork. Pt may try these in the meantime but will RTC in am.

## 2011-09-01 NOTE — Telephone Encounter (Signed)
I would suggest she use either a FLeet's enema or buy some Milk of Magnesia (the MOM will take a few hours to work, the enema is quicker).  May take the MOM a second time to ensure she empties out all old stool.  Pain should be less and she should get some relief.

## 2011-09-01 NOTE — Telephone Encounter (Signed)
PT IS WAITING FOR A CALL BACK REGARDING HER MEDICATIONS  STATES SHE CALLED YESTERDAY

## 2011-09-02 ENCOUNTER — Ambulatory Visit (INDEPENDENT_AMBULATORY_CARE_PROVIDER_SITE_OTHER): Payer: BC Managed Care – PPO | Admitting: Internal Medicine

## 2011-09-02 ENCOUNTER — Encounter: Payer: Self-pay | Admitting: Internal Medicine

## 2011-09-02 VITALS — BP 110/70 | HR 68 | Temp 98.2°F | Resp 16 | Ht 62.5 in | Wt 208.4 lb

## 2011-09-02 DIAGNOSIS — R1012 Left upper quadrant pain: Secondary | ICD-10-CM

## 2011-09-02 DIAGNOSIS — K859 Acute pancreatitis without necrosis or infection, unspecified: Secondary | ICD-10-CM | POA: Insufficient documentation

## 2011-09-02 LAB — HEPATIC FUNCTION PANEL
Bilirubin, Direct: 0.1 mg/dL (ref 0.0–0.3)
Indirect Bilirubin: 0.2 mg/dL (ref 0.0–0.9)
Total Protein: 7.6 g/dL (ref 6.0–8.3)

## 2011-09-02 LAB — LIPASE: Lipase: 22 U/L (ref 0–75)

## 2011-09-02 NOTE — Progress Notes (Signed)
  Subjective:    Patient ID: Sandra Bishop, female    DOB: [birth year], 41 y.o.   MRN: [id number]  HPI  Mild acute pancreatitis improving. No N, V, fever Needs repeat lipase, LFTS, and [place].  Review of Systems     Objective:   Physical Exam  Mildly tender LUQ, no reb.      Assessment & Plan:   LFTS, lipase  [place]  F/up 2/19 9-10am

## 2011-09-05 ENCOUNTER — Encounter: Payer: Self-pay | Admitting: *Deleted

## 2011-09-05 DIAGNOSIS — D649 Anemia, unspecified: Secondary | ICD-10-CM | POA: Insufficient documentation

## 2011-09-05 DIAGNOSIS — E669 Obesity, unspecified: Secondary | ICD-10-CM | POA: Insufficient documentation

## 2011-09-06 ENCOUNTER — Ambulatory Visit (INDEPENDENT_AMBULATORY_CARE_PROVIDER_SITE_OTHER): Payer: BC Managed Care – PPO | Admitting: Family Medicine

## 2011-09-06 ENCOUNTER — Encounter: Payer: Self-pay | Admitting: Family Medicine

## 2011-09-06 DIAGNOSIS — Z01419 Encounter for gynecological examination (general) (routine) without abnormal findings: Secondary | ICD-10-CM

## 2011-09-06 DIAGNOSIS — Z0000 Encounter for general adult medical examination without abnormal findings: Secondary | ICD-10-CM

## 2011-09-06 DIAGNOSIS — K5900 Constipation, unspecified: Secondary | ICD-10-CM

## 2011-09-06 DIAGNOSIS — E669 Obesity, unspecified: Secondary | ICD-10-CM

## 2011-09-06 LAB — IFOBT (OCCULT BLOOD): IFOBT: NEGATIVE

## 2011-09-06 NOTE — Progress Notes (Signed)
  Subjective:    Patient ID: Sandra Bishop, female    DOB: 08-22-1970, 41 y.o.   MRN: [id number]  HPI   This 41 y.o AA female is here for GYN exam, having been to [facility] on several occasions  over the last 41 days. She is feeling better with less constipation and no abdominal pain. She is  scheduled for Abd [place] tomorrow; we reviewed labs drawn at recent visits. She has been having   some night sweats;her menses are irregular, last 5 days without pain. She has a hx of migraines.    Review of Systems  All other systems reviewed and are negative.          Objective:   Physical Exam  Vitals reviewed. Constitutional: She is oriented to person, place, and time. She appears well-developed and well-nourished. No distress.       Moderately obese   HENT:  Head: Normocephalic and atraumatic.  Right Ear: External ear normal.  Left Ear: External ear normal.  Nose: Nose normal.  Eyes: Conjunctivae and EOM are normal. No scleral icterus.  Neck: Normal range of motion. Neck supple. No thyromegaly present.  Cardiovascular: Normal rate, regular rhythm and normal heart sounds.   Pulmonary/Chest: Effort normal. No respiratory distress.  Abdominal: Soft. Bowel sounds are normal. She exhibits no distension and no mass. There is no tenderness. There is no guarding.  Genitourinary: Vagina normal and uterus normal. Guaiac negative stool. No vaginal discharge found.  Musculoskeletal: Normal range of motion. She exhibits no edema.  Lymphadenopathy:    She has no cervical adenopathy.  Neurological: She is alert and oriented to person, place, and time. No cranial nerve deficit.  Skin: Skin is warm and dry.  Psychiatric: She has a normal mood and affect. Her behavior is normal. Thought content normal.          Assessment & Plan:   1. Pap smear, as part of routine gynecological examination  Pap IG, CT/NG w/ reflex HPV when ASC-U  2. Routine general medical examination at a health care  facility  IFOBT POC (occult bld, rslt in office), MM Digital Screening  3. Obesity  Encouraged Healthier lifestyle choices.  4. Constipation  Improve diet and try Benefiber to regulate BMs.

## 2011-09-06 NOTE — Patient Instructions (Signed)
Mammogram to be scheduled. Get your US done in the AM.     Keeping You Healthy  Get These Tests 1. Blood Pressure- Have your blood pressure checked once a year by your health care provider.  Normal blood pressure is 120/80. 2. Weight- Have your body mass index (BMI) calculated to screen for obesity.  BMI is measure of body fat based on height and weight.  You can also calculate your own BMI at https://www.west-esparza.com/. 3. Cholesterol- Have your cholesterol checked every 5 years starting at age 71 then yearly starting at age 62. 4. Chlamydia, HIV, and other sexually transmitted diseases- Get screened every year until age 65, then within three months of each new sexual provider. 5. Pap Smear- Every 1-3 years; discuss with your health care provider. 6. Mammogram- Every year starting at age 79  Take these medicines  Calcium with Vitamin D-Your body needs 1200 mg of Calcium each day and [phone] IU of Vitamin D daily.  Your body can only absorb 500 mg of Calcium at a time so Calcium must be taken in 2 or 3 divided doses throughout the day.  Multivitamin with folic acid- Once daily if it is possible for you to become pregnant.  Get these Immunizations  Gardasil-Series of three doses; prevents HPV related illness such as genital warts and cervical cancer.  Menactra-Single dose; prevents meningitis.  Tetanus shot- Every 10 years.  Flu shot-Every year.  Take these steps 1. Do not smoke-Your healthcare provider can help you quit.  For tips on how to quit go to www.smokefree.gov or call 1-800 QUITNOW. 2. Be physically active- Exercise 5 days a week for at least 30 minutes.  If you are not already physically active, start slow and gradually work up to 30 minutes of moderate physical activity.  Examples of moderate activity include walking briskly, dancing, swimming, bicycling, etc. 3. Breast Cancer- A self breast exam every month is important for early detection of breast cancer.  For more  information and instruction on self breast exams, ask your healthcare provider or [email or web address]. 4. Eat a healthy diet- Eat a variety of healthy foods such as fruits, vegetables, whole grains, low fat milk, low fat cheeses, yogurt, lean meats, poultry and fish, beans, nuts, tofu, etc.  For more information go to www. Thenutritionsource.org 5. Drink alcohol in moderation- Limit alcohol intake to one drink or less per day. Never drink and drive. 6. Depression- Your emotional health is as important as your physical health.  If you're feeling down or losing interest in things you normally enjoy please talk to your healthcare provider about being screened for depression. 7. Dental visit- Brush and floss your teeth twice daily; visit your dentist twice a year. 8. Eye doctor- Get an eye exam at least every 2 years. 9. Helmet use- Always wear a helmet when riding a bicycle, motorcycle, rollerblading or skateboarding. 10. Safe sex- If you may be exposed to sexually transmitted infections, use a condom. 11. Seat belts- Seat belts can save your live; always wear one. 12. Smoke/Carbon Monoxide detectors- These detectors need to be installed on the appropriate level of your home. Replace batteries at least once a year. 13. Skin cancer- When out in the sun please cover up and use sunscreen 15 SPF or higher. 14. Violence- If anyone is threatening or hurting you, please tell your healthcare provider.

## 2011-09-07 ENCOUNTER — Ambulatory Visit
Admission: RE | Admit: 2011-09-07 | Discharge: 2011-09-07 | Disposition: A | Discharge status: Home / Self Care | Payer: Self-pay | Source: Ambulatory Visit | Attending: Internal Medicine | Admitting: Internal Medicine

## 2011-09-07 DIAGNOSIS — K859 Acute pancreatitis without necrosis or infection, unspecified: Secondary | ICD-10-CM

## 2011-09-07 IMAGING — US US ABDOMEN COMPLETE
1 series · 14 of 25 positions shown · non-contrast
Comparison: None.

CLINICAL DATA: Abdominal pain, pancreatitis

ABDOMINAL ULTRASOUND COMPLETE

[Series 1: us abdomen complete · 0.39mm/px · 14 of 68 slices shown]
[im 1/68]
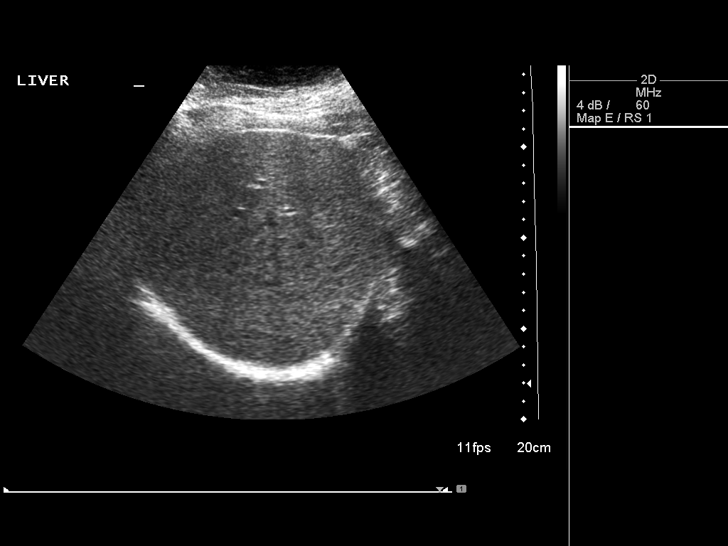
[im 6/68]
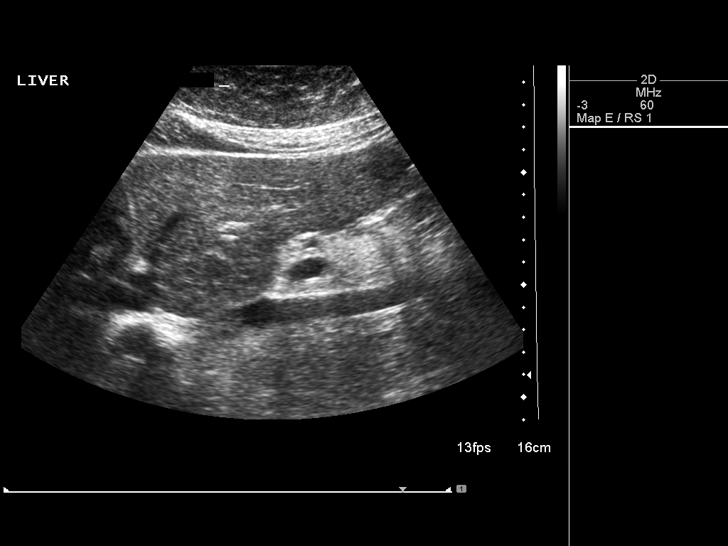
[im 12/68]
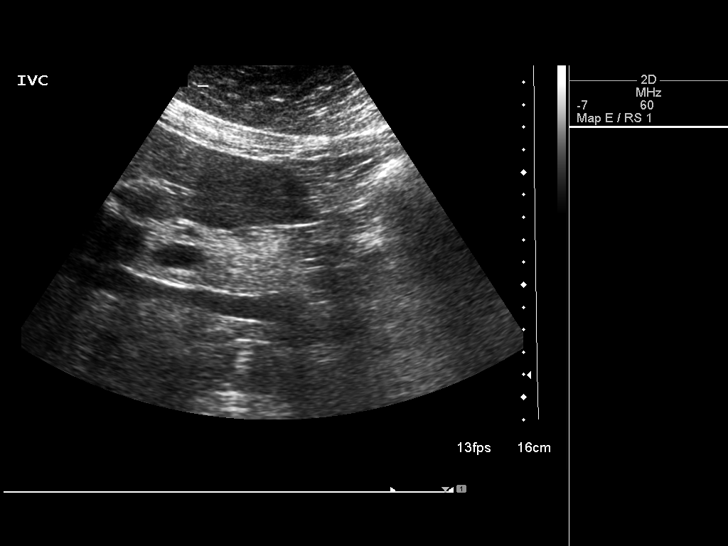
[im 17/68]
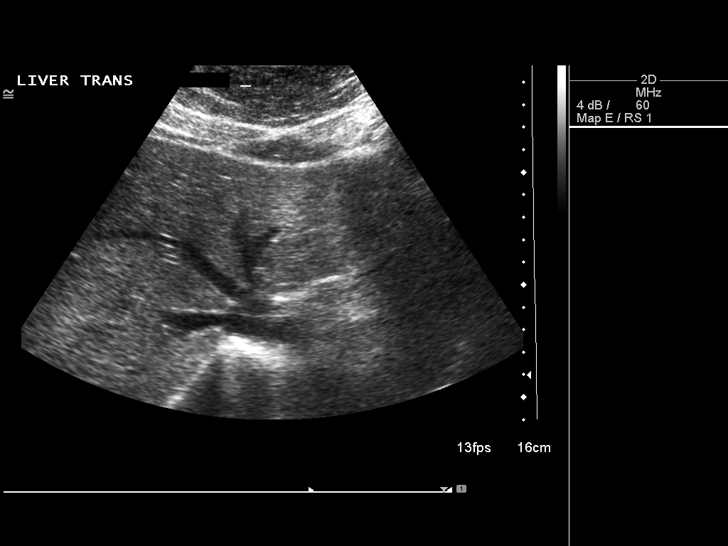
[im 23/68]
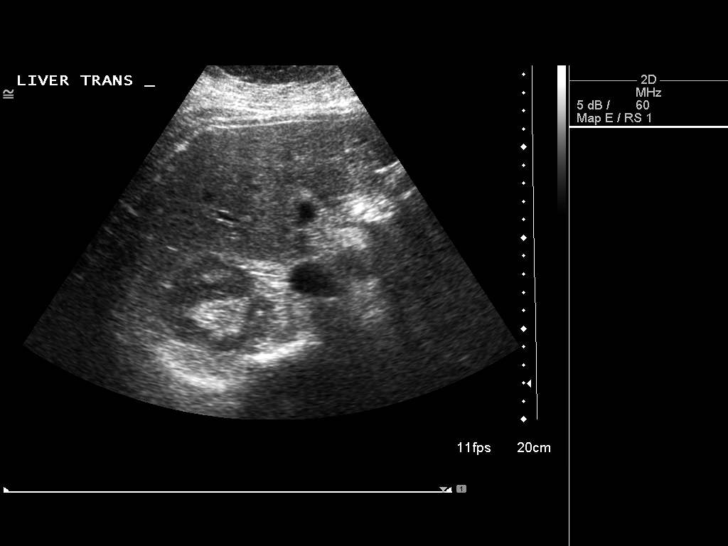
[im 26/68]
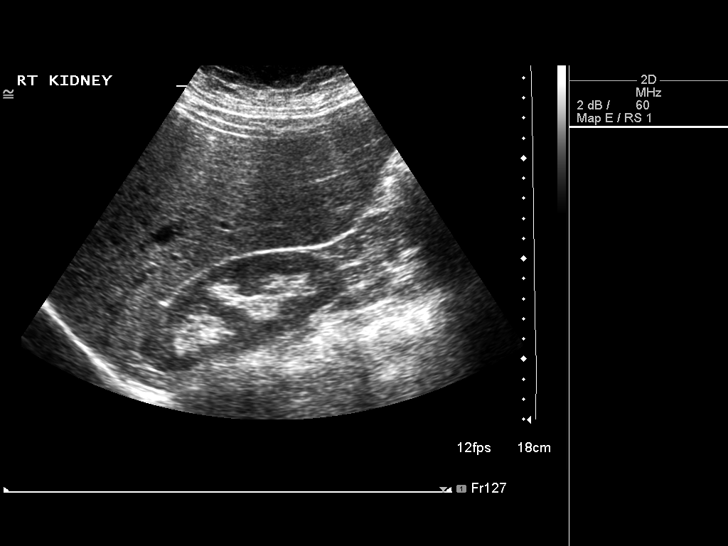
[im 31/68]
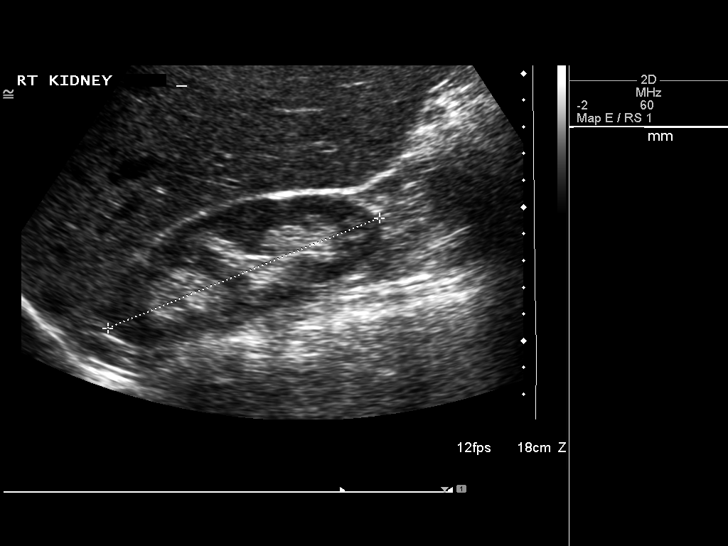
[im 37/68]
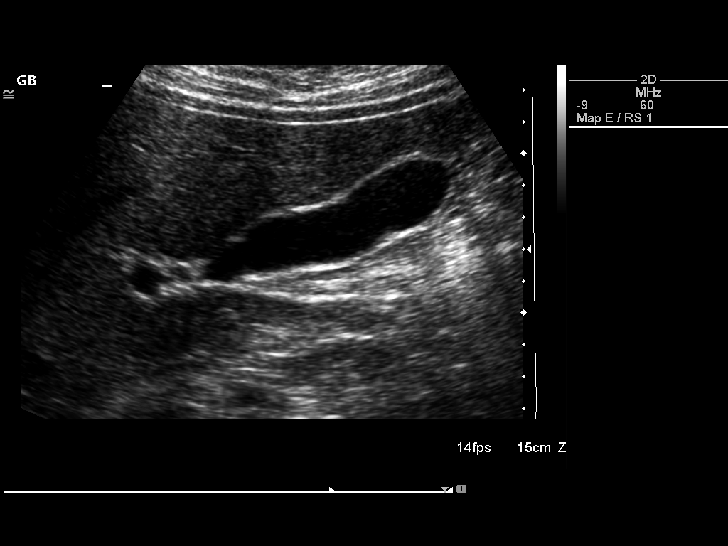
[im 42/68]
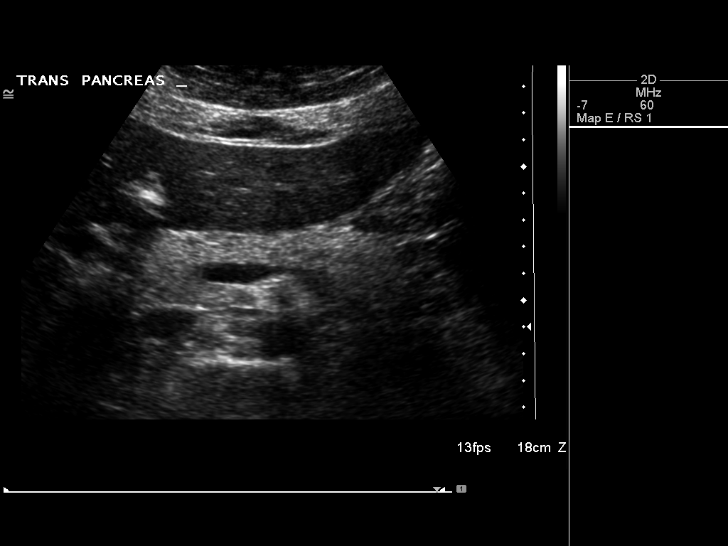
[im 45/68]
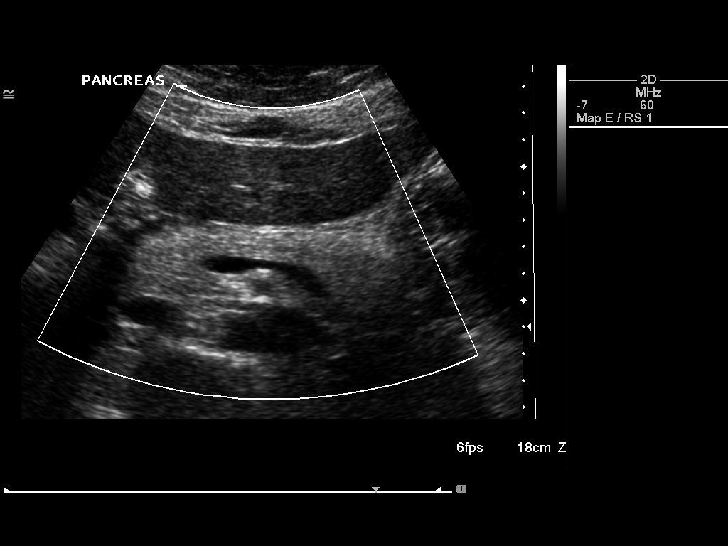
[im 51/68]
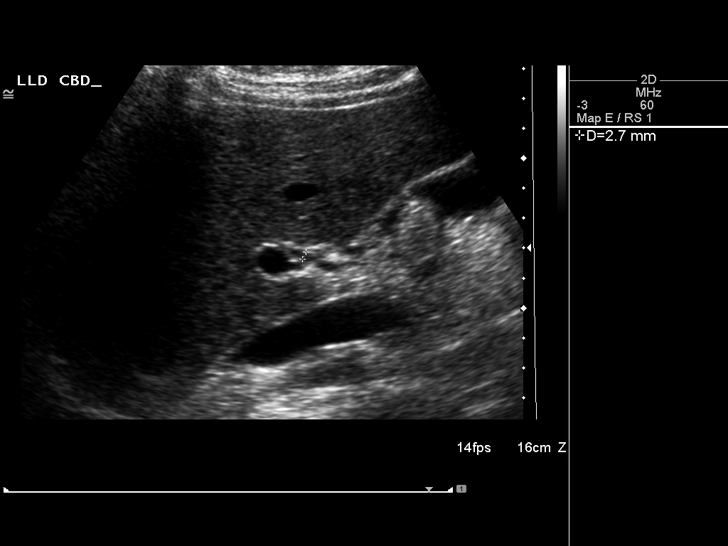
[im 56/68]
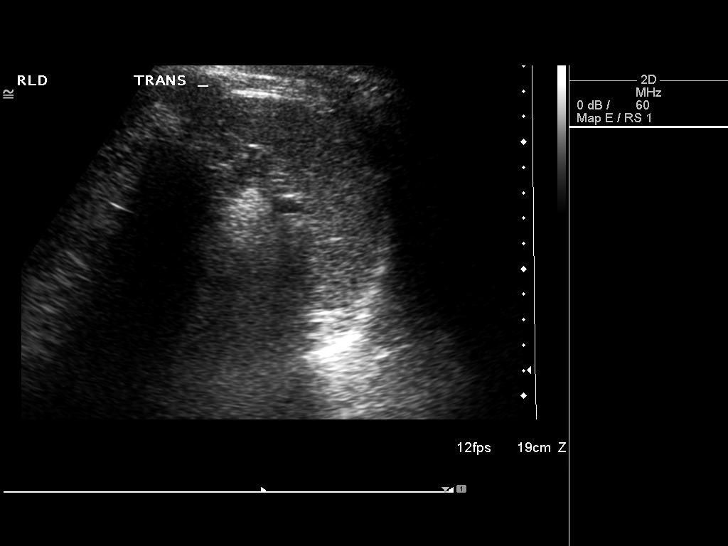
[im 62/68]
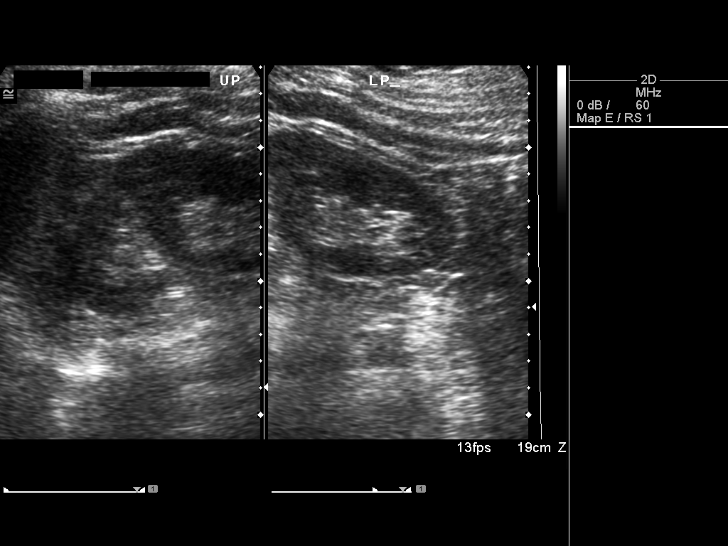
[im 68/68]
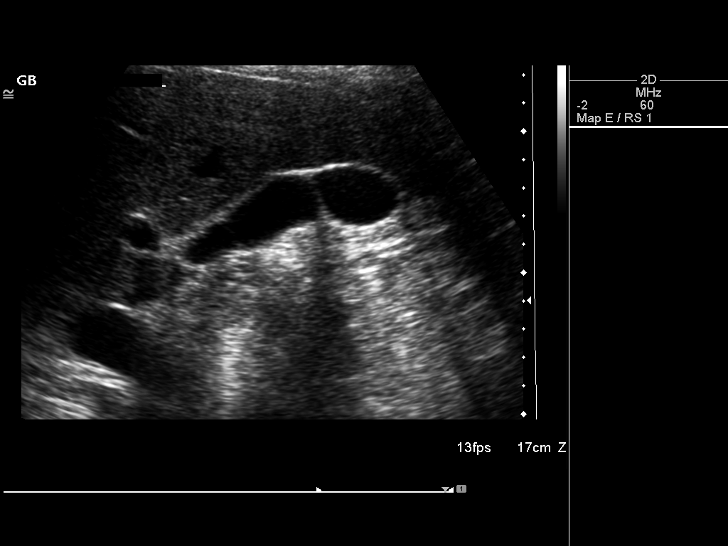

[14 of 25 positions shown; findings below may reference images not displayed]

FINDINGS: Gallbladder:  No gallstones, gallbladder wall thickening, or
pericholecystic fluid.

Common Bile Duct:  Within normal limits in caliber.

Liver: No focal mass lesion identified.  Within normal limits in
parenchymal echogenicity.

IVC:  Appears normal.

Pancreas:  No abnormality identified.

Spleen:  Within normal limits in size and echotexture.

Right kidney:  Normal in size and parenchymal echogenicity.  No
evidence of mass or hydronephrosis.

Left kidney:  Normal in size and parenchymal echogenicity.  No
evidence of mass or hydronephrosis.

Abdominal Aorta:  No aneurysm identified.
IMPRESSION: Negative abdominal ultrasound.

## 2011-09-08 MED ORDER — AZITHROMYCIN 1 G PO PACK: 1.0000 | PACK | Freq: Once | ORAL | Status: AC

## 2011-09-08 NOTE — Progress Notes (Signed)
Quick Note:  Please call pt and advise that the following labs are abnormal... The Chlamydia test is positive and requires treatment.  Rx: Zithromax 1 gram packet as a single dose; this will be routed to the pharmacy she has on file. ______

## 2011-09-08 NOTE — Progress Notes (Signed)
Addended by: Dow Adolph B on: 09/08/2011 11:28 AM   Modules accepted: Orders

## 2011-09-11 LAB — PAP IG, CT-NG, RFX HPV ASCU
Chlamydia Probe Amp: POSITIVE — AB
GC Probe Amp: NEGATIVE

## 2011-10-06 ENCOUNTER — Telehealth: Payer: Self-pay

## 2011-10-06 NOTE — Telephone Encounter (Signed)
Pt called referrals voicemail and said she was supposed to have a mammogram, I do not see anything in the workque for this.

## 2011-10-07 NOTE — Telephone Encounter (Signed)
PT WILL CALL [facility] FOR AN APPT FOR ANNUAL SCREENING MAMMOGRAM

## 2011-10-16 ENCOUNTER — Other Ambulatory Visit: Payer: Self-pay | Admitting: Family Medicine

## 2011-10-16 DIAGNOSIS — Z1231 Encounter for screening mammogram for malignant neoplasm of breast: Secondary | ICD-10-CM

## 2011-10-31 ENCOUNTER — Ambulatory Visit
Admission: RE | Admit: 2011-10-31 | Discharge: 2011-10-31 | Disposition: A | Discharge status: Home / Self Care | Payer: BC Managed Care – PPO | Source: Ambulatory Visit | Attending: Family Medicine | Admitting: Family Medicine

## 2011-10-31 DIAGNOSIS — Z1231 Encounter for screening mammogram for malignant neoplasm of breast: Secondary | ICD-10-CM

## 2011-11-23 ENCOUNTER — Ambulatory Visit: Payer: BC Managed Care – PPO

## 2011-11-23 ENCOUNTER — Ambulatory Visit (INDEPENDENT_AMBULATORY_CARE_PROVIDER_SITE_OTHER): Payer: BC Managed Care – PPO | Admitting: Emergency Medicine

## 2011-11-23 VITALS — BP 116/78 | HR 76 | Temp 97.2°F | Resp 20 | Ht 62.0 in | Wt 210.8 lb

## 2011-11-23 DIAGNOSIS — R062 Wheezing: Secondary | ICD-10-CM

## 2011-11-23 DIAGNOSIS — R9389 Abnormal findings on diagnostic imaging of other specified body structures: Secondary | ICD-10-CM

## 2011-11-23 DIAGNOSIS — R05 Cough: Secondary | ICD-10-CM

## 2011-11-23 DIAGNOSIS — J029 Acute pharyngitis, unspecified: Secondary | ICD-10-CM

## 2011-11-23 DIAGNOSIS — Z9109 Other allergy status, other than to drugs and biological substances: Secondary | ICD-10-CM

## 2011-11-23 DIAGNOSIS — R059 Cough, unspecified: Secondary | ICD-10-CM

## 2011-11-23 DIAGNOSIS — R918 Other nonspecific abnormal finding of lung field: Secondary | ICD-10-CM

## 2011-11-23 DIAGNOSIS — R079 Chest pain, unspecified: Secondary | ICD-10-CM

## 2011-11-23 DIAGNOSIS — J301 Allergic rhinitis due to pollen: Secondary | ICD-10-CM

## 2011-11-23 LAB — POCT RAPID STREP A (OFFICE): Rapid Strep A Screen: NEGATIVE

## 2011-11-23 LAB — POCT CBC
Granulocyte percent: 44.9 %G (ref 37–80)
HCT, POC: 36.6 % — AB (ref 37.7–47.9)
Hemoglobin: 11.6 g/dL — AB (ref 12.2–16.2)
MCH, POC: 26.1 pg — AB (ref 27–31.2)
MCV: 82.4 fL (ref 80–97)
MID (cbc): 0.6 (ref 0–0.9)
Platelet Count, POC: 311 10*3/uL (ref 142–424)
RBC: 4.44 M/uL (ref 4.04–5.48)
WBC: 8.5 10*3/uL (ref 4.6–10.2)

## 2011-11-23 IMAGING — CR DG CHEST 2V
2 series · 2 of 2 positions shown · non-contrast
Comparison: [DATE]

CLINICAL DATA: Cough and wheezing

CHEST - 2 VIEW

[PA]
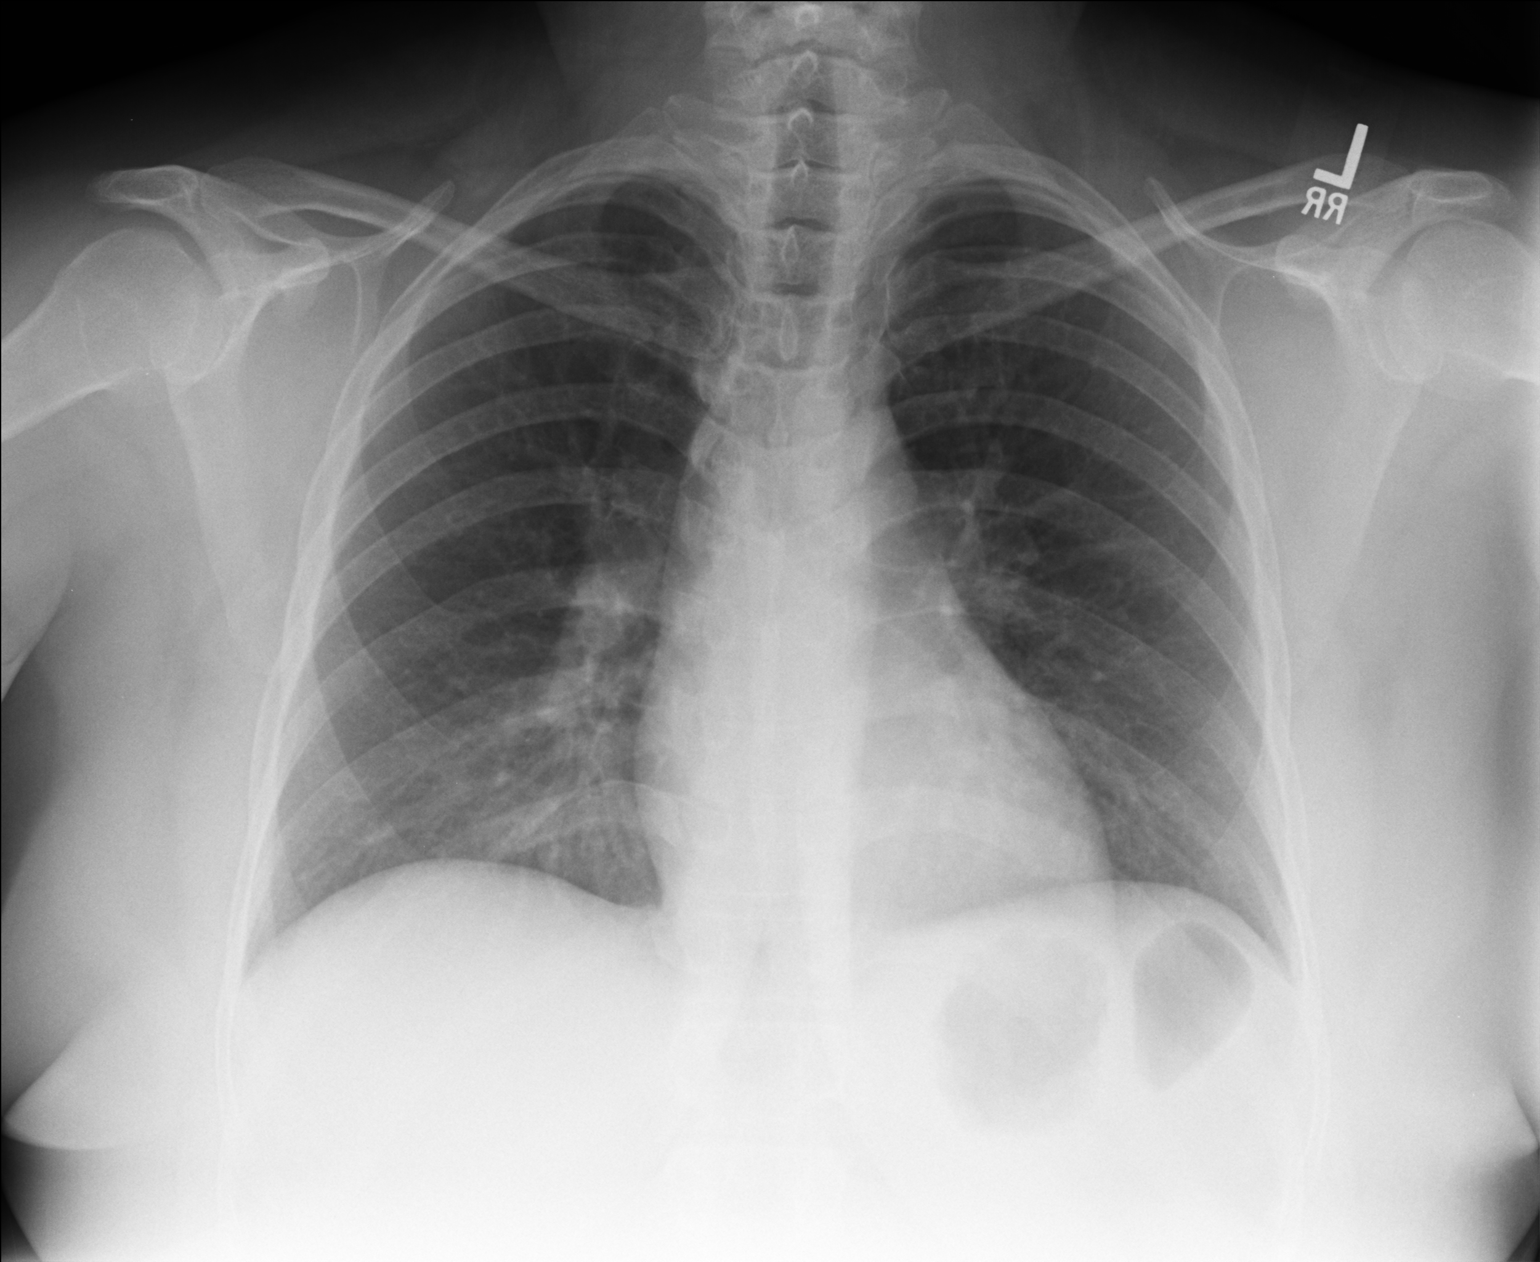

[lateral]
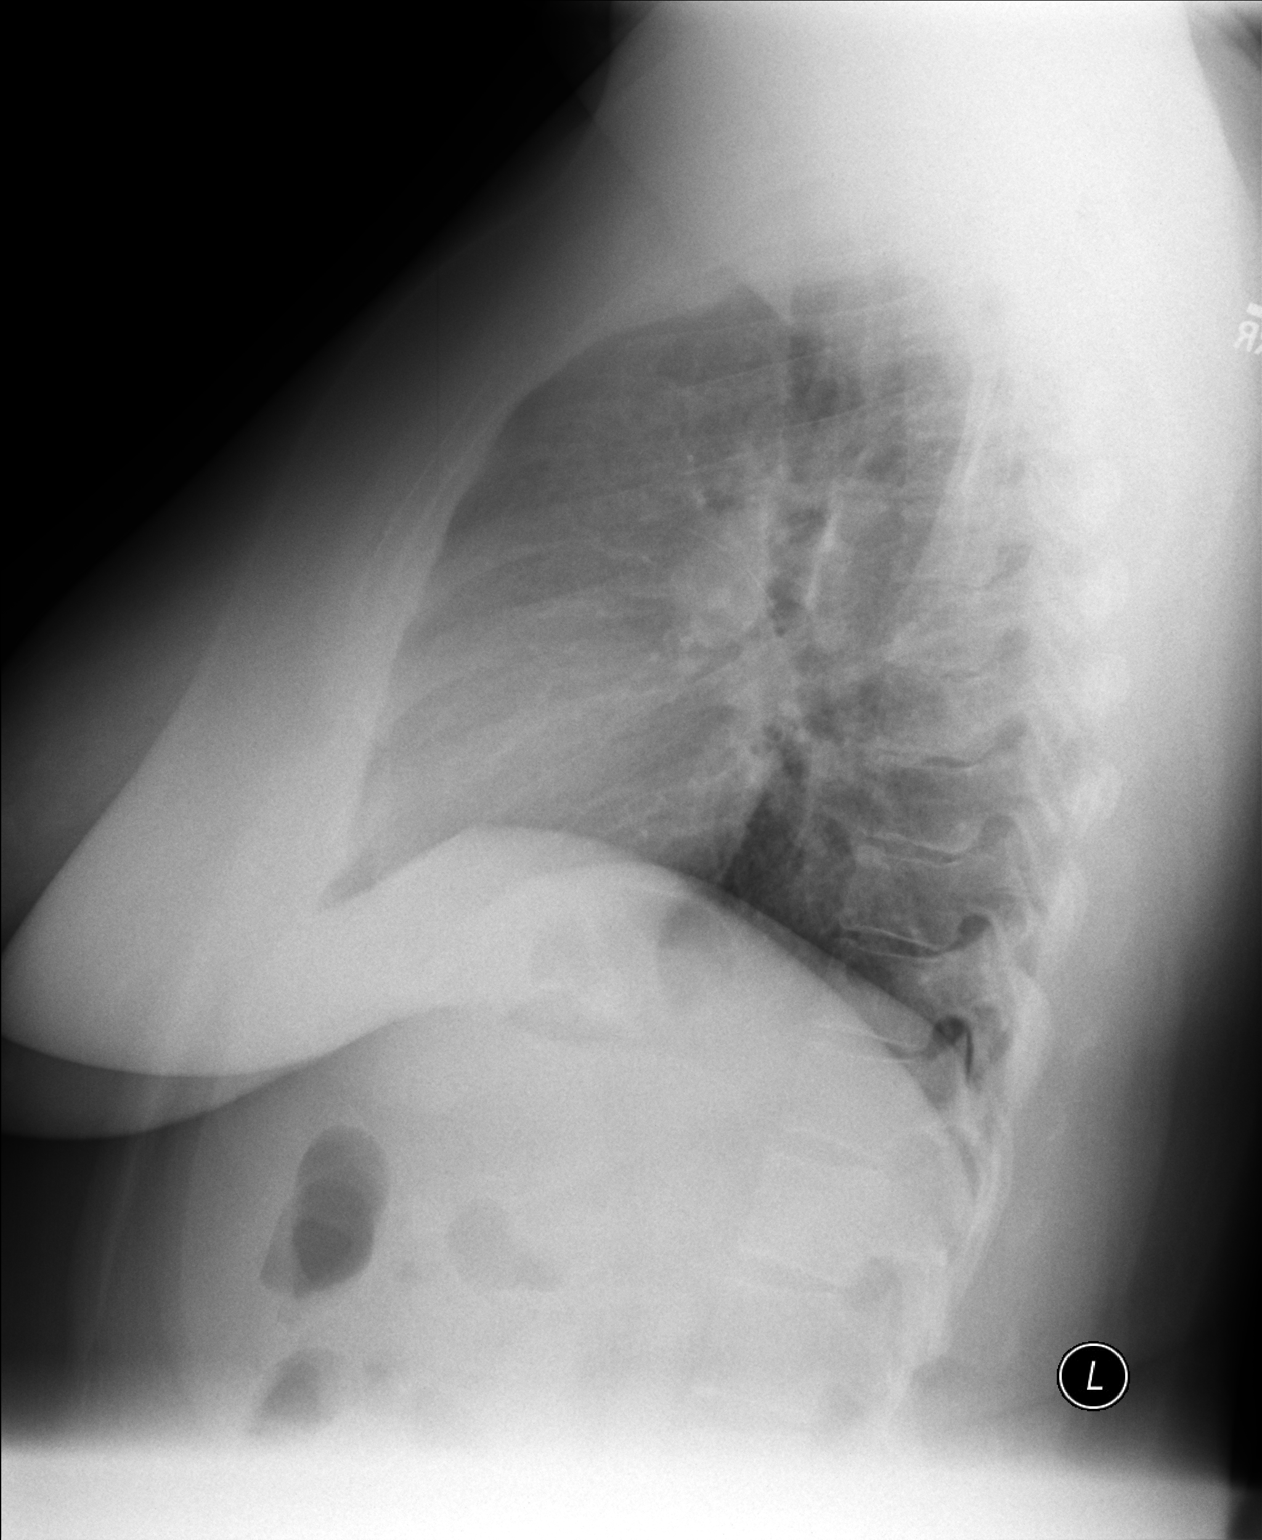

[2 of 2 positions shown; findings below may reference images not displayed]

FINDINGS: The heart size and mediastinal contours are within normal
limits.  Both lungs are clear.  Hilar prominence on the lateral
radiograph appears similar to [DATE].  The visualized skeletal
structures are unremarkable.
IMPRESSION: 1.  No acute cardiopulmonary abnormalities.

Clinically significant discrepancy from primary report, if
provided: None

## 2011-11-23 MED ORDER — BENZONATATE 200 MG PO CAPS: 200.0000 mg | ORAL_CAPSULE | Freq: Two times a day (BID) | ORAL | Status: AC | PRN

## 2011-11-23 MED ORDER — ALBUTEROL SULFATE (2.5 MG/3ML) 0.083% IN NEBU
2.5000 mg | INHALATION_SOLUTION | Freq: Once | RESPIRATORY_TRACT | Status: AC
  Administered 2011-11-23: 2.5 mg via RESPIRATORY_TRACT

## 2011-11-23 MED ORDER — ALBUTEROL SULFATE HFA 108 (90 BASE) MCG/ACT IN AERS: 2.0000 | INHALATION_SPRAY | RESPIRATORY_TRACT | Status: DC | PRN

## 2011-11-23 NOTE — Progress Notes (Signed)
  Subjective:    Patient ID: Sandra Bishop, female    DOB: 09-11-1970, 41 y.o.   MRN: [id number]  HPI patient enters with chief complaint of nasal congestion sore throat and a dry nonproductive cough. Her cough has been associated with wheezing at night. She denies any fever. She denies having a productive cough she has no history of asthma but does note wheezing at night. Today she noticed a severe discomfort along the left side of her chest and became concerned and presented for evaluation.    Review of Systems she has not been short of breath she denies any trauma to that area she has not noticed a rash.     Objective:   Physical Exam  Constitutional: She appears well-developed and well-nourished.  HENT:  Head: Normocephalic.  Eyes: Pupils are equal, round, and reactive to light.  Neck: No tracheal deviation present. No thyromegaly present.  Cardiovascular: Normal rate, regular rhythm and normal heart sounds.   Pulmonary/Chest: Effort normal and breath sounds normal. No respiratory distress. She has no wheezes. She has no rales. She exhibits no tenderness.  Abdominal: She exhibits no mass. There is no tenderness. There is no rebound and no guarding.  Musculoskeletal: Normal range of motion.       There is tenderness along the left costal margin is also tenderness left lateral chest    [facility] reading (PRIMARY) by  Dr. Cleta Alberts chest x-ray on lateral appears to show hilar prominence. No definite infiltrates are seen  Results for orders placed in visit on 11/23/11  POCT RAPID STREP A (OFFICE)      Component Value Range   Rapid Strep A Screen Negative  Negative   POCT CBC      Component Value Range   WBC 8.5  4.6 - 10.2 (K/uL)   Lymph, poc 4.1 (*) 0.6 - 3.4    POC LYMPH PERCENT 48.2  10 - 50 (%L)   MID (cbc) 0.6  0 - 0.9    POC MID % 6.9  0 - 12 (%M)   POC Granulocyte 3.8  2 - 6.9    Granulocyte percent 44.9  37 - 80 (%G)   RBC 4.44  4.04 - 5.48 (M/uL)   Hemoglobin 11.6 (*) 12.2 -  16.2 (g/dL)   HCT, POC 31.5 (*) 40.0 - 47.9 (%)   MCV 82.4  80 - 97 (fL)   MCH, POC 26.1 (*) 27 - 31.2 (pg)   MCHC 31.7 (*) 31.8 - 35.4 (g/dL)   RDW, POC 86.7     Platelet Count, POC 311  142 - 424 (K/uL)   MPV 8.5  0 - 99.8 (fL)       Assessment & Plan:    We'll check strep test chest x-ray the also try an albuterol treatment and see if this opens her up. We will prescribe an albuterol inhaler and Tessalon Perles for cough. She is advised to take either Advil or Aleve for her chest wall discomfort

## 2011-11-24 LAB — ANGIOTENSIN CONVERTING ENZYME: Angiotensin-Converting Enzyme: 49 U/L (ref 8–52)

## 2012-02-09 ENCOUNTER — Emergency Department (HOSPITAL_COMMUNITY)
Admission: EM | Admit: 2012-02-09 | Discharge: 2012-02-09 | Disposition: A | Discharge status: Home / Self Care | Payer: BC Managed Care – PPO | Attending: Emergency Medicine | Admitting: Emergency Medicine

## 2012-02-09 ENCOUNTER — Encounter (HOSPITAL_COMMUNITY): Payer: Self-pay | Admitting: *Deleted

## 2012-02-09 DIAGNOSIS — Z87891 Personal history of nicotine dependence: Secondary | ICD-10-CM | POA: Insufficient documentation

## 2012-02-09 DIAGNOSIS — N898 Other specified noninflammatory disorders of vagina: Secondary | ICD-10-CM

## 2012-02-09 DIAGNOSIS — E669 Obesity, unspecified: Secondary | ICD-10-CM | POA: Insufficient documentation

## 2012-02-09 DIAGNOSIS — A599 Trichomoniasis, unspecified: Secondary | ICD-10-CM | POA: Insufficient documentation

## 2012-02-09 LAB — URINALYSIS, ROUTINE W REFLEX MICROSCOPIC
Bilirubin Urine: NEGATIVE
Glucose, UA: NEGATIVE mg/dL
Hgb urine dipstick: NEGATIVE
Ketones, ur: NEGATIVE mg/dL
Nitrite: NEGATIVE
Protein, ur: NEGATIVE mg/dL
Specific Gravity, Urine: 1.026 (ref 1.005–1.030)
Urobilinogen, UA: 0.2 mg/dL (ref 0.0–1.0)
pH: 6 (ref 5.0–8.0)

## 2012-02-09 LAB — WET PREP, GENITAL

## 2012-02-09 LAB — URINE MICROSCOPIC-ADD ON

## 2012-02-09 MED ORDER — AZITHROMYCIN 250 MG PO TABS
1000.0000 mg | ORAL_TABLET | Freq: Once | ORAL | Status: AC
  Administered 2012-02-09: 1000 mg via ORAL
  Filled 2012-02-09: qty 4

## 2012-02-09 MED ORDER — CEFTRIAXONE SODIUM 250 MG IJ SOLR
250.0000 mg | Freq: Once | INTRAMUSCULAR | Status: AC
  Administered 2012-02-09: 250 mg via INTRAMUSCULAR
  Filled 2012-02-09: qty 250

## 2012-02-09 MED ORDER — METRONIDAZOLE 500 MG PO TABS
2000.0000 mg | ORAL_TABLET | Freq: Once | ORAL | Status: AC
  Administered 2012-02-09: 2000 mg via ORAL
  Filled 2012-02-09: qty 4

## 2012-02-09 MED ORDER — ONDANSETRON 4 MG PO TBDP
4.0000 mg | ORAL_TABLET | Freq: Once | ORAL | Status: AC
  Administered 2012-02-09: 4 mg via ORAL
  Filled 2012-02-09: qty 1

## 2012-02-09 NOTE — ED Provider Notes (Signed)
History     CSN: [id number]  Arrival date & time 02/09/12  1157   None     Chief Complaint  Patient presents with  . Vaginal Itching  . Vaginal Discharge    (Consider location/radiation/quality/duration/timing/severity/associated sxs/prior treatment) HPI Comments: States she had a normal physical exam by her PCP in may 2013.  Normal u/a and vaginal exam.  "i slept with an old boyfriend June 6th" and has had vaginal d/c and itching since then.  No rash.  No UTI sxs.  Patient is a 41 y.o. female presenting with vaginal itching and vaginal discharge. The history is provided by the patient. No language interpreter was used.  Vaginal Itching This is a new problem. Episode onset: ~ 3 weeks ago. The problem occurs constantly. The problem has been unchanged. Pertinent negatives include no abdominal pain, chills, fever or urinary symptoms. Nothing aggravates the symptoms. She has tried nothing for the symptoms.  Vaginal Discharge Pertinent negatives include no abdominal pain, chills, fever or urinary symptoms.    Past Medical History  Diagnosis Date  . Anemia   . Constipation   . Obesity     Past Surgical History  Procedure Date  . Tubal ligation     No family history on file.  History  Substance Use Topics  . Smoking status: Former Smoker    Types: Cigarettes    Quit date: 12/28/2010  . Smokeless tobacco: Not on file  . Alcohol Use: No    OB History    Grav Para Term Preterm Abortions TAB SAB Ect Mult Living                  Review of Systems  Constitutional: Negative for fever and chills.  Gastrointestinal: Negative for abdominal pain.  Genitourinary: Positive for vaginal discharge. Negative for dysuria, urgency, frequency, hematuria, flank pain and vaginal bleeding.  All other systems reviewed and are negative.    Allergies  Review of patient's allergies indicates no known allergies.  Home Medications   Current Outpatient Rx  Name Route Sig Dispense  Refill  . ALBUTEROL SULFATE HFA 108 (90 BASE) MCG/ACT IN AERS Inhalation Inhale 2 puffs into the lungs every 4 (four) hours as needed for wheezing. 1 Inhaler 0  . CETIRIZINE HCL 10 MG PO TABS Oral Take 10 mg by mouth daily as needed. For allergies.    . ADULT MULTIVITAMIN W/MINERALS CH Oral Take 1 tablet by mouth daily.    Marland Kitchen POLYETHYLENE GLYCOL 3350 PO POWD Oral Take 17 g by mouth 2 (two) times daily as needed. For constipation.      BP 123/77  Pulse 70  Temp 98 F (36.7 C) (Oral)  Resp 16  SpO2 100%  Physical Exam  Nursing note and vitals reviewed. Constitutional: She is oriented to person, place, and time. She appears well-developed and well-nourished. No distress.  HENT:  Head: Normocephalic and atraumatic.  Eyes: EOM are normal.  Neck: Normal range of motion.  Cardiovascular: Normal rate, regular rhythm and normal heart sounds.   Pulmonary/Chest: Effort normal and breath sounds normal.  Abdominal: Soft. She exhibits no distension. There is no tenderness.  Genitourinary: Pelvic exam was performed with patient supine. There is no rash, tenderness, lesion or injury on the right labia. There is no rash, tenderness, lesion or injury on the left labia. No erythema, tenderness or bleeding around the vagina. No foreign body around the vagina. No signs of injury around the vagina. Vaginal discharge found.  Discharge evident at introitus.  Large quantitiy also evident at cervical os and in posterior fornix.  Bimanual exam reveals no CMT or adnexal tenderness or masses.  Musculoskeletal: Normal range of motion.  Neurological: She is alert and oriented to person, place, and time.  Skin: Skin is warm and dry.  Psychiatric: She has a normal mood and affect. Judgment normal.    ED Course  Procedures (including critical care time)  Labs Reviewed  URINALYSIS, ROUTINE W REFLEX MICROSCOPIC - Abnormal; Notable for the following:    APPearance CLOUDY (*)     Leukocytes, UA LARGE (*)      All other components within normal limits  URINE MICROSCOPIC-ADD ON - Abnormal; Notable for the following:    Squamous Epithelial / LPF MANY (*)     Bacteria, UA MANY (*)     All other components within normal limits  GC/CHLAMYDIA PROBE AMP, GENITAL  RPR  WET PREP, GENITAL  URINE CULTURE   No results found.   1. Vaginal Discharge   2. Trichomonas       MDM  Flagyl 2 gm po Rocephin 250 mg IM zithromax 1 gm po Consider HIV testing.  F/u with PCP        Evalina Field, PA 02/09/12 1432

## 2012-02-09 NOTE — ED Provider Notes (Signed)
Medical screening examination/treatment/procedure(s) were performed by non-physician practitioner and as supervising physician I was immediately available for consultation/collaboration.  Raeford Razor, MD 02/09/12 [id number]

## 2012-02-09 NOTE — ED Notes (Signed)
Pt reports vaginal itching x 3 weeks with pink/brown discharge. Pt has taken monistat 7 OTC without relief.

## 2012-02-09 NOTE — ED Notes (Signed)
Pt reports three weeks of vaginal discharge and itching. Pt reports unprotected sex in June, but denies history of other unprotected sex recently. Pt reports trying monistat without relief. Pt denies abdominal pain or pelvic pain.

## 2012-02-10 LAB — RPR: RPR Ser Ql: NONREACTIVE

## 2012-02-10 LAB — GC/CHLAMYDIA PROBE AMP, GENITAL
Chlamydia, DNA Probe: NEGATIVE
GC Probe Amp, Genital: NEGATIVE

## 2012-02-12 LAB — URINE CULTURE: Special Requests: NORMAL

## 2012-02-13 NOTE — ED Notes (Addendum)
Chart returned from EDP office  For review of positive urine culture

## 2012-02-15 NOTE — ED Notes (Signed)
No treatment

## 2012-11-18 ENCOUNTER — Other Ambulatory Visit: Payer: Self-pay

## 2012-11-18 DIAGNOSIS — Z1231 Encounter for screening mammogram for malignant neoplasm of breast: Secondary | ICD-10-CM

## 2012-12-24 ENCOUNTER — Ambulatory Visit
Admission: RE | Admit: 2012-12-24 | Discharge: 2012-12-24 | Disposition: A | Discharge status: Home / Self Care | Payer: BC Managed Care – PPO | Source: Ambulatory Visit

## 2012-12-24 DIAGNOSIS — Z1231 Encounter for screening mammogram for malignant neoplasm of breast: Secondary | ICD-10-CM

## 2012-12-25 IMAGING — MG MM DIGITAL SCREENING BILAT
4 series · 4 of 4 positions shown · non-contrast
Comparison: [DATE]

CLINICAL DATA: Screening.

DIGITAL SCREENING BILATERAL MAMMOGRAM WITH CAD

[R CC]
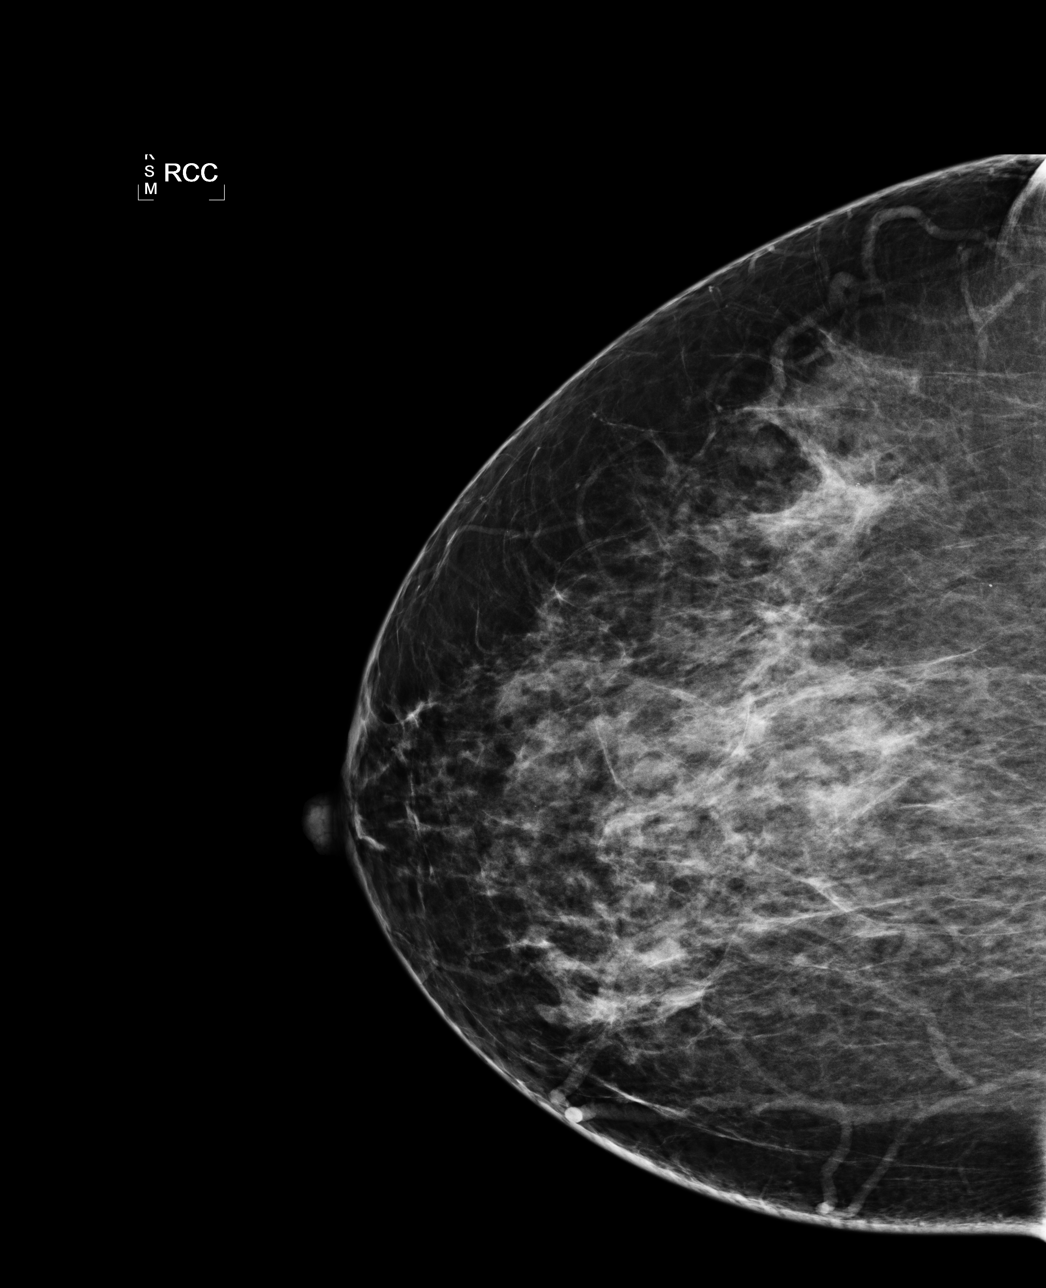

[L CC]
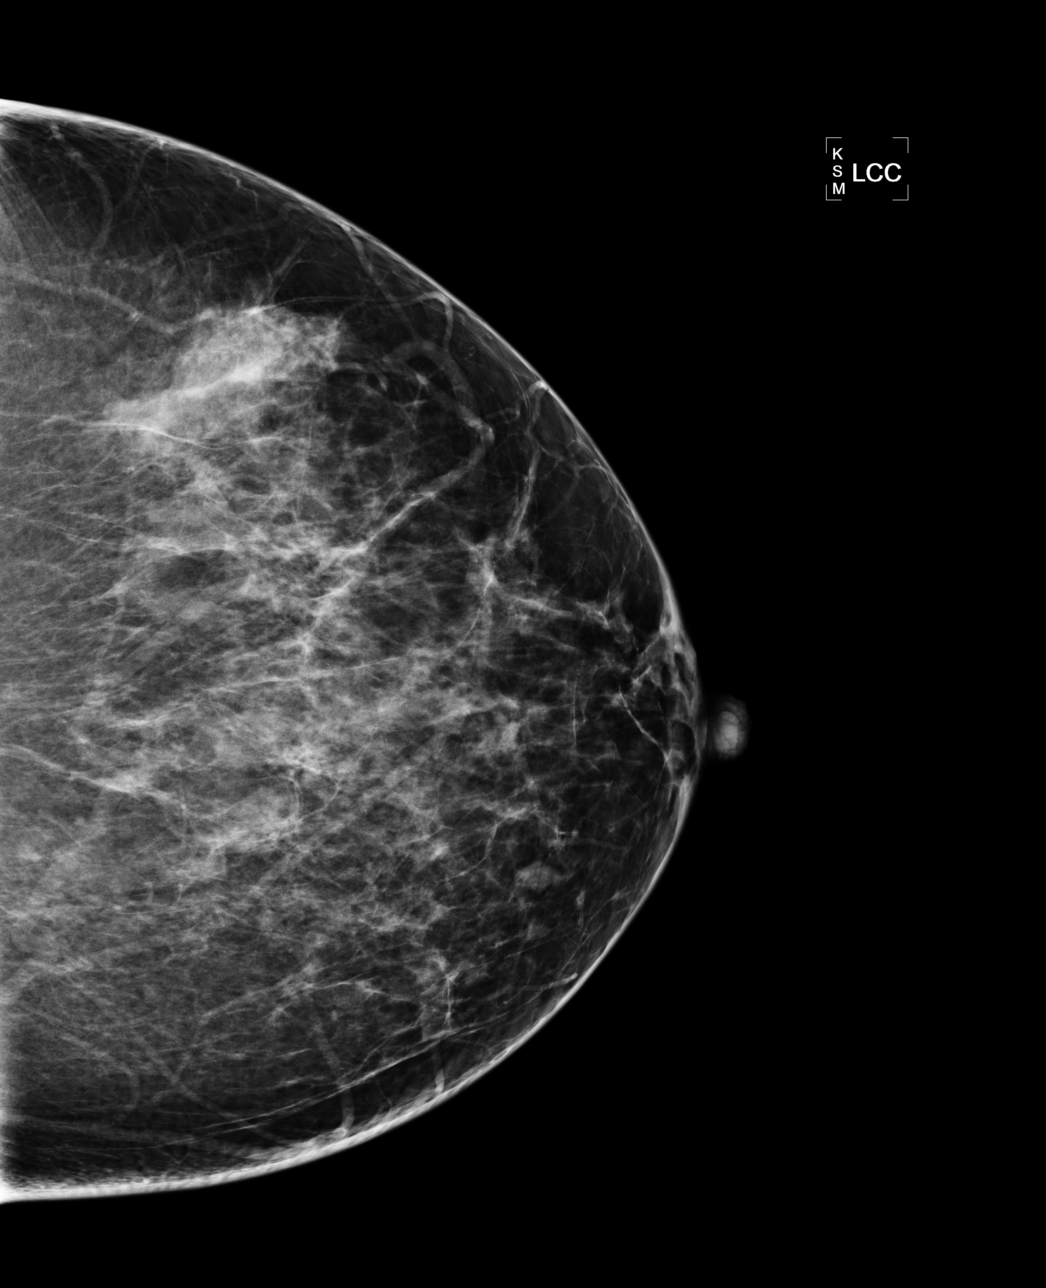

[L MLO]
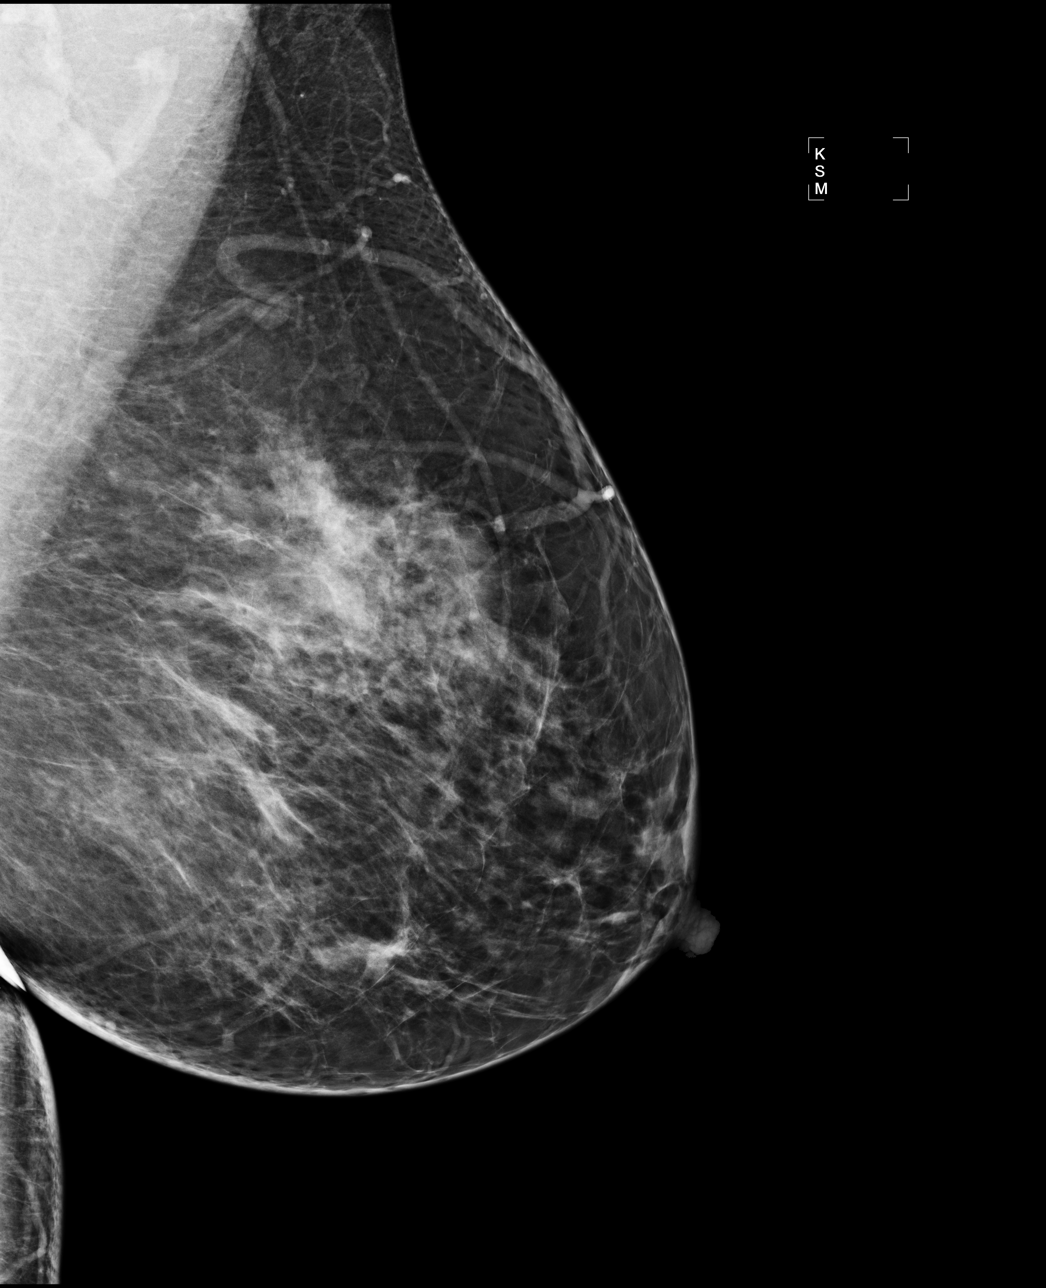

[R MLO]
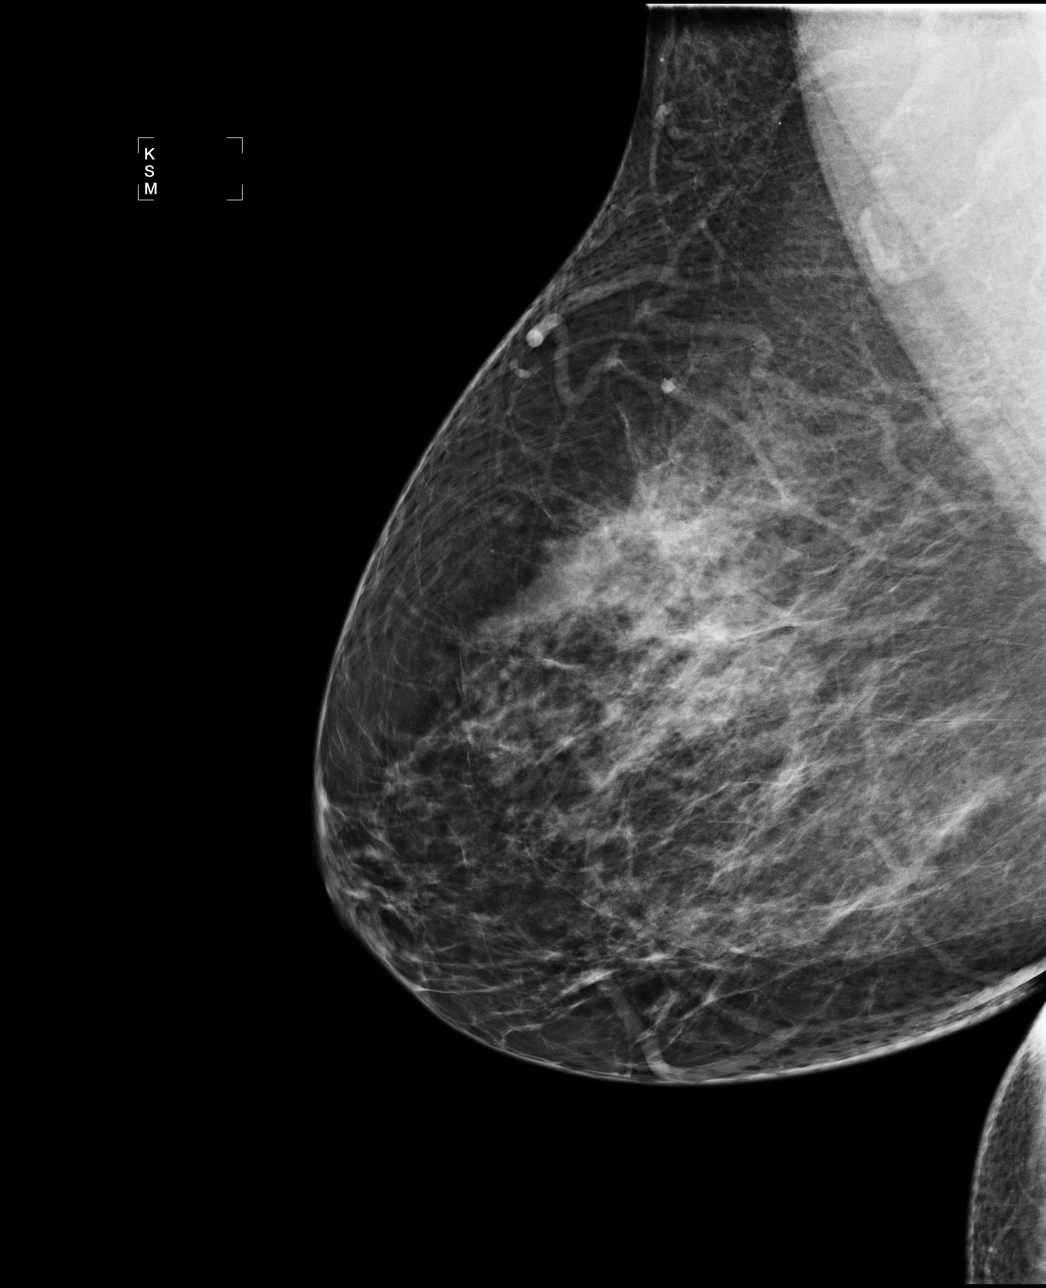

[4 of 4 positions shown; findings below may reference images not displayed]

FINDINGS: ACR Breast Density Category 2: There are scattered fibroglandular
densities.

There is no suspicious dominant mass, architectural distortion, or
calcification to suggest malignancy.

Images were processed with CAD.
IMPRESSION: No mammographic evidence of malignancy.

A result letter of this screening mammogram will be mailed directly
to the patient.

RECOMMENDATION:
Screening mammogram in one year. (Code:[AF])

BI-RADS CATEGORY 1:  Negative.

## 2013-02-23 ENCOUNTER — Other Ambulatory Visit: Payer: Self-pay | Admitting: Internal Medicine

## 2013-02-24 ENCOUNTER — Other Ambulatory Visit: Payer: Self-pay | Admitting: Internal Medicine

## 2013-02-27 ENCOUNTER — Other Ambulatory Visit: Payer: Self-pay | Admitting: Physician Assistant

## 2013-08-06 ENCOUNTER — Encounter (HOSPITAL_COMMUNITY): Payer: Self-pay | Admitting: Emergency Medicine

## 2013-08-06 ENCOUNTER — Emergency Department (INDEPENDENT_AMBULATORY_CARE_PROVIDER_SITE_OTHER)
Admission: EM | Admit: 2013-08-06 | Discharge: 2013-08-06 | Disposition: A | Discharge status: Home / Self Care | Payer: BC Managed Care – PPO | Source: Home / Self Care | Attending: Family Medicine | Admitting: Family Medicine

## 2013-08-06 DIAGNOSIS — J329 Chronic sinusitis, unspecified: Secondary | ICD-10-CM

## 2013-08-06 MED ORDER — TRAMADOL HCL 50 MG PO TABS: 50.0000 mg | ORAL_TABLET | Freq: Four times a day (QID) | ORAL | Status: DC | PRN

## 2013-08-06 MED ORDER — PREDNISONE 10 MG PO TABS: 30.0000 mg | ORAL_TABLET | Freq: Every day | ORAL | Status: DC

## 2013-08-06 MED ORDER — AMOXICILLIN-POT CLAVULANATE 875-125 MG PO TABS: 1.0000 | ORAL_TABLET | Freq: Two times a day (BID) | ORAL | Status: DC

## 2013-08-06 MED ORDER — IPRATROPIUM BROMIDE 0.06 % NA SOLN: 2.0000 | Freq: Four times a day (QID) | NASAL | Status: DC

## 2013-08-06 NOTE — ED Provider Notes (Signed)
Sandra Bishop is a 43 y.o. female who presents to Urgent Care today for one week of nasal congestion and runny nose. However yesterday she worsened and developed right-sided facial pain fevers and chills and body aches. She's been trying Mucinex and over-the-counter sinus medications with been slightly helpful. She denies any nausea vomiting diarrhea or shortness of breath. She feels well otherwise.   Past Medical History  Diagnosis Date  . Anemia   . Constipation   . Obesity    History  Substance Use Topics  . Smoking status: Former Smoker    Types: Cigarettes    Quit date: 12/28/2010  . Smokeless tobacco: Not on file  . Alcohol Use: No   ROS as above Medications: No current facility-administered medications for this encounter.   Current Outpatient Prescriptions  Medication Sig Dispense Refill  . Multiple Vitamin (MULTIVITAMIN WITH MINERALS) TABS Take 1 tablet by mouth daily.      Marland Kitchen albuterol (PROVENTIL HFA;VENTOLIN HFA) 108 (90 BASE) MCG/ACT inhaler Inhale 2 puffs into the lungs every 4 (four) hours as needed for wheezing.  1 Inhaler  0  . amoxicillin-clavulanate (AUGMENTIN) 875-125 MG per tablet Take 1 tablet by mouth every 12 (twelve) hours.  14 tablet  0  . cetirizine (ZYRTEC) 10 MG tablet Take 10 mg by mouth daily as needed. For allergies.      Marland Kitchen ipratropium (ATROVENT) 0.06 % nasal spray Place 2 sprays into both nostrils 4 (four) times daily.  15 mL  1  . polyethylene glycol powder (GLYCOLAX/MIRALAX) powder Take 17 g by mouth 2 (two) times daily as needed. For constipation.      . polyethylene glycol powder (GLYCOLAX/MIRALAX) powder MIX 17 GRAMS WITH LIQUID TWICE DAILY AS NEEDED  1054 g  0  . predniSONE (DELTASONE) 10 MG tablet Take 3 tablets (30 mg total) by mouth daily.  15 tablet  0  . traMADol (ULTRAM) 50 MG tablet Take 1 tablet (50 mg total) by mouth every 6 (six) hours as needed.  15 tablet  0    Exam:  BP 130/94  Pulse 92  Temp(Src) 98.1 F (36.7 C) (Oral)  Resp  18  SpO2 100%  LMP 08/06/2013 Gen: Well NAD HEENT: EOMI,  MMM tender palpation right maxillary sinus. Posterior pharynx with cobblestoning. Tympanic membranes are normal appearing bilaterally. Bilateral nasal turbinates are slightly inflamed but no significant discharge. Lungs: Normal work of breathing. CTABL Heart: RRR no MRG Abd: NABS, Soft. NT, ND Exts: Brisk capillary refill, warm and well perfused.   Assessment and Plan: 43 y.o. female with sinusitis. Plan to treat with prednisone, and Augmentin. Additionally we'll use Atrovent nasal spray. Will use tramadol for pain and cough control. Work note provided. Followup with primary care provider.  Discussed warning signs or symptoms. Please see discharge instructions. Patient expresses understanding.    Gregor Hams, MD 08/06/13 1630

## 2013-08-06 NOTE — ED Notes (Signed)
Pt c/o cold sxs onset Thursday Sxs include: congestion, HA, runny nose, BA, nauseas Denies: f/v/d, SOB, wheezing Alert w/no signs of acute distress.

## 2013-08-06 NOTE — Discharge Instructions (Signed)
Thank you for coming in today. Take prednisone daily for 5 days. Take Augmentin twice daily for 7 days. Use the Atrovent nasal spray. Use tramadol for pain or cough as needed. Do not drive after taking this medication Call or go to the emergency room if you get worse, have trouble breathing, have chest pains, or palpitations.   Sinusitis Sinusitis is redness, soreness, and swelling (inflammation) of the paranasal sinuses. Paranasal sinuses are air pockets within the bones of your face (beneath the eyes, the middle of the forehead, or above the eyes). In healthy paranasal sinuses, mucus is able to drain out, and air is able to circulate through them by way of your nose. However, when your paranasal sinuses are inflamed, mucus and air can become trapped. This can allow bacteria and other germs to grow and cause infection. Sinusitis can develop quickly and last only a short time (acute) or continue over a long period (chronic). Sinusitis that lasts for more than 12 weeks is considered chronic.  CAUSES  Causes of sinusitis include:  Allergies.  Structural abnormalities, such as displacement of the cartilage that separates your nostrils (deviated septum), which can decrease the air flow through your nose and sinuses and affect sinus drainage.  Functional abnormalities, such as when the small hairs (cilia) that line your sinuses and help remove mucus do not work properly or are not present. SYMPTOMS  Symptoms of acute and chronic sinusitis are the same. The primary symptoms are pain and pressure around the affected sinuses. Other symptoms include:  Upper toothache.  Earache.  Headache.  Bad breath.  Decreased sense of smell and taste.  A cough, which worsens when you are lying flat.  Fatigue.  Fever.  Thick drainage from your nose, which often is green and may contain pus (purulent).  Swelling and warmth over the affected sinuses. DIAGNOSIS  Your caregiver will perform a physical  exam. During the exam, your caregiver may:  Look in your nose for signs of abnormal growths in your nostrils (nasal polyps).  Tap over the affected sinus to check for signs of infection.  View the inside of your sinuses (endoscopy) with a special imaging device with a light attached (endoscope), which is inserted into your sinuses. If your caregiver suspects that you have chronic sinusitis, one or more of the following tests may be recommended:  Allergy tests.  Nasal culture A sample of mucus is taken from your nose and sent to a lab and screened for bacteria.  Nasal cytology A sample of mucus is taken from your nose and examined by your caregiver to determine if your sinusitis is related to an allergy. TREATMENT  Most cases of acute sinusitis are related to a viral infection and will resolve on their own within 10 days. Sometimes medicines are prescribed to help relieve symptoms (pain medicine, decongestants, nasal steroid sprays, or saline sprays).  However, for sinusitis related to a bacterial infection, your caregiver will prescribe antibiotic medicines. These are medicines that will help kill the bacteria causing the infection.  Rarely, sinusitis is caused by a fungal infection. In theses cases, your caregiver will prescribe antifungal medicine. For some cases of chronic sinusitis, surgery is needed. Generally, these are cases in which sinusitis recurs more than 3 times per year, despite other treatments. HOME CARE INSTRUCTIONS   Drink plenty of water. Water helps thin the mucus so your sinuses can drain more easily.  Use a humidifier.  Inhale steam 3 to 4 times a day (for example, sit  in the bathroom with the shower running).  Apply a warm, moist washcloth to your face 3 to 4 times a day, or as directed by your caregiver.  Use saline nasal sprays to help moisten and clean your sinuses.  Take over-the-counter or prescription medicines for pain, discomfort, or fever only as  directed by your caregiver. SEEK IMMEDIATE MEDICAL CARE IF:  You have increasing pain or severe headaches.  You have nausea, vomiting, or drowsiness.  You have swelling around your face.  You have vision problems.  You have a stiff neck.  You have difficulty breathing. MAKE SURE YOU:   Understand these instructions.  Will watch your condition.  Will get help right away if you are not doing well or get worse. Document Released: 07/03/2005 Document Revised: 09/25/2011 Document Reviewed: 07/18/2011 [facility] Patient Information 2014 [place], [place].

## 2013-08-08 NOTE — ED Notes (Signed)
Pain worse w breathing, and wonders if she should be concerned. Advised we will be delighted to see her as a recheck for her new concerns

## 2013-08-08 NOTE — ED Notes (Signed)
C/o left sided upper chest pain, worse w breathing

## 2013-12-28 ENCOUNTER — Encounter (HOSPITAL_BASED_OUTPATIENT_CLINIC_OR_DEPARTMENT_OTHER): Payer: Self-pay | Admitting: Emergency Medicine

## 2013-12-28 ENCOUNTER — Emergency Department (HOSPITAL_BASED_OUTPATIENT_CLINIC_OR_DEPARTMENT_OTHER): Payer: BC Managed Care – PPO

## 2013-12-28 ENCOUNTER — Emergency Department (HOSPITAL_BASED_OUTPATIENT_CLINIC_OR_DEPARTMENT_OTHER)
Admission: EM | Admit: 2013-12-28 | Discharge: 2013-12-28 | Disposition: A | Discharge status: Home / Self Care | Payer: BC Managed Care – PPO | Attending: Emergency Medicine | Admitting: Emergency Medicine

## 2013-12-28 DIAGNOSIS — R000 Tachycardia, unspecified: Secondary | ICD-10-CM | POA: Insufficient documentation

## 2013-12-28 DIAGNOSIS — Z79899 Other long term (current) drug therapy: Secondary | ICD-10-CM | POA: Insufficient documentation

## 2013-12-28 DIAGNOSIS — Z8719 Personal history of other diseases of the digestive system: Secondary | ICD-10-CM | POA: Insufficient documentation

## 2013-12-28 DIAGNOSIS — J069 Acute upper respiratory infection, unspecified: Secondary | ICD-10-CM

## 2013-12-28 DIAGNOSIS — Z87891 Personal history of nicotine dependence: Secondary | ICD-10-CM | POA: Insufficient documentation

## 2013-12-28 DIAGNOSIS — Z792 Long term (current) use of antibiotics: Secondary | ICD-10-CM | POA: Insufficient documentation

## 2013-12-28 DIAGNOSIS — Z862 Personal history of diseases of the blood and blood-forming organs and certain disorders involving the immune mechanism: Secondary | ICD-10-CM | POA: Insufficient documentation

## 2013-12-28 DIAGNOSIS — IMO0002 Reserved for concepts with insufficient information to code with codable children: Secondary | ICD-10-CM | POA: Insufficient documentation

## 2013-12-28 DIAGNOSIS — E669 Obesity, unspecified: Secondary | ICD-10-CM | POA: Insufficient documentation

## 2013-12-28 IMAGING — CR DG CHEST 2V
2 series · 2 of 2 positions shown · non-contrast
Comparison: [DATE]

CLINICAL DATA: Cough and congestion.

EXAM:
CHEST  2 VIEW

[w chest pa]
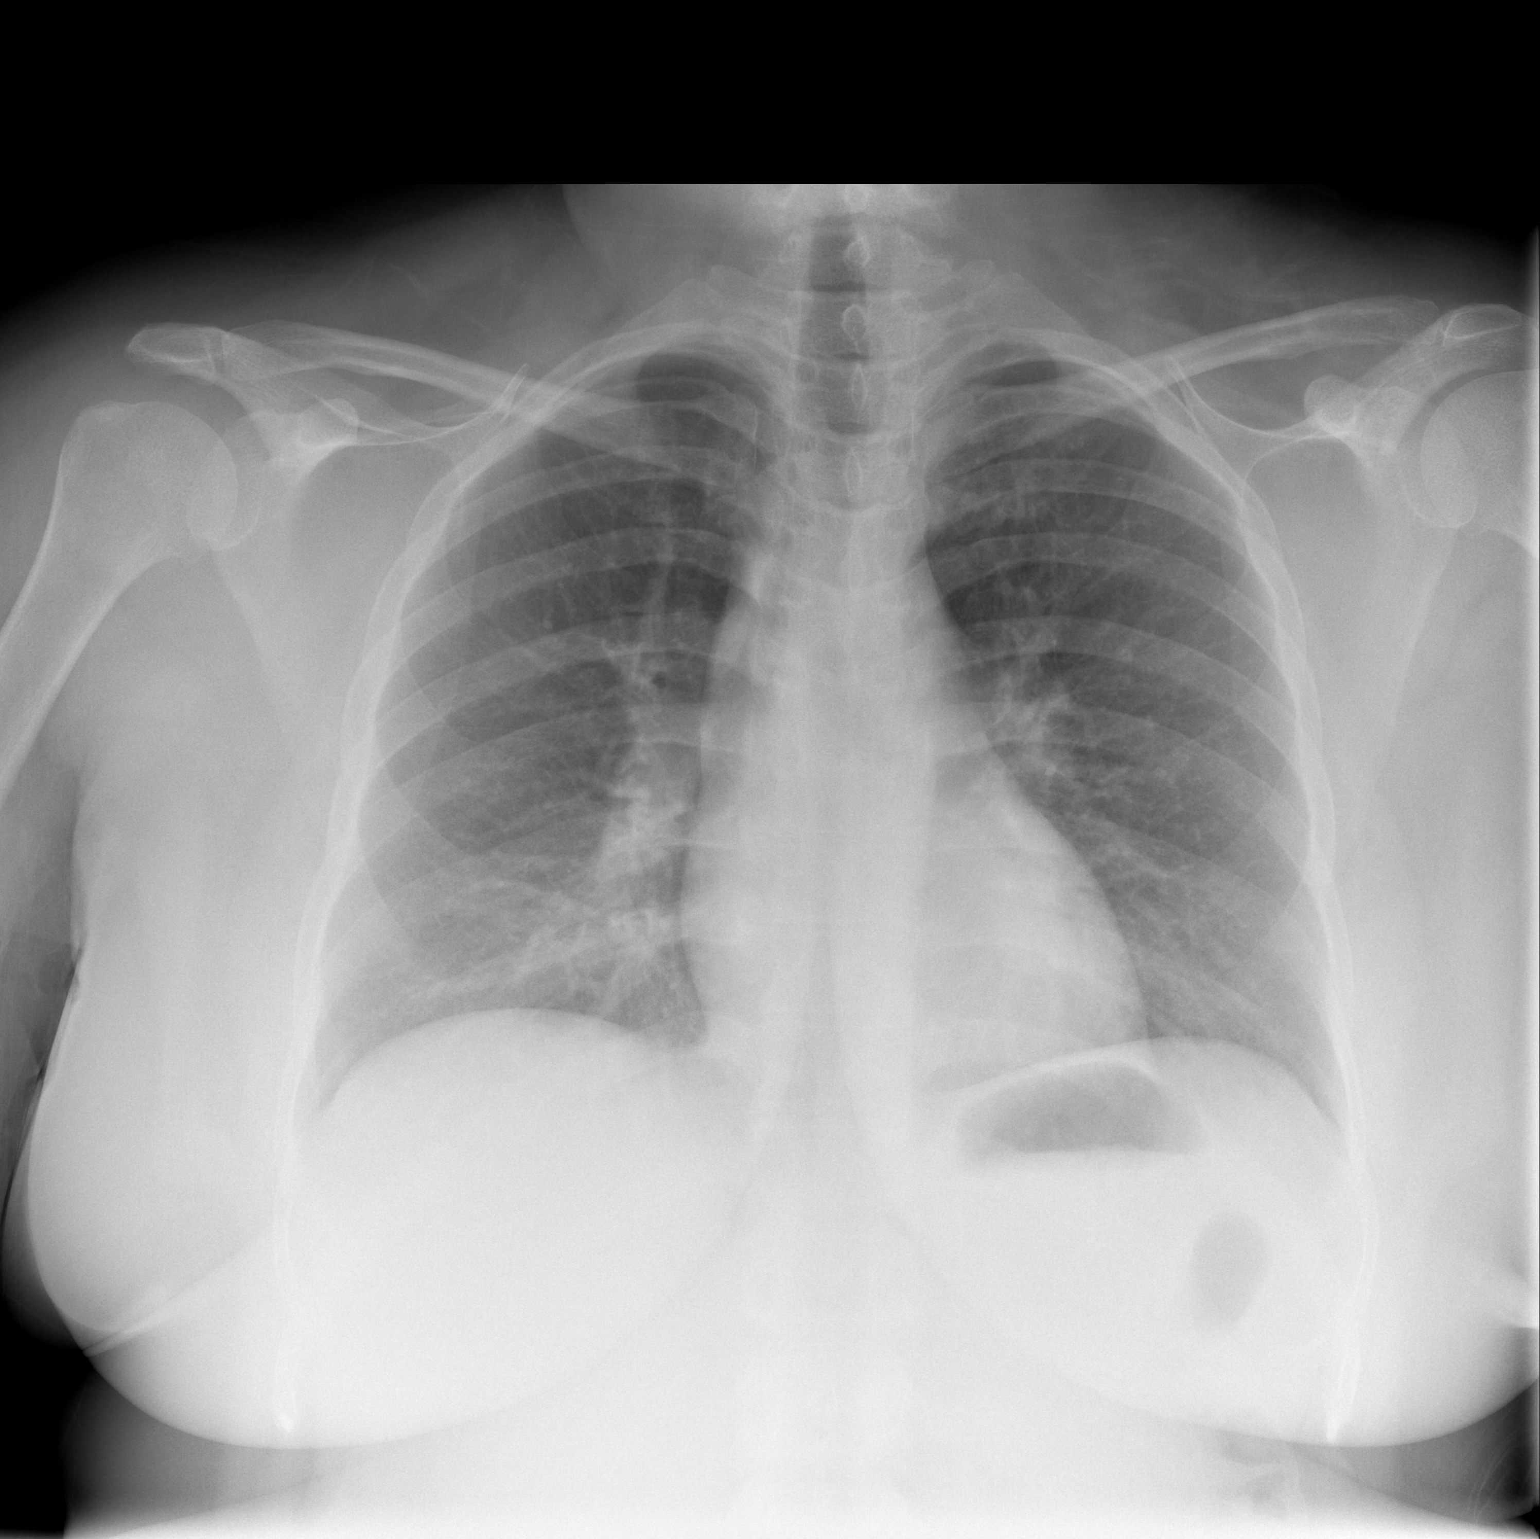

[w chest lat]
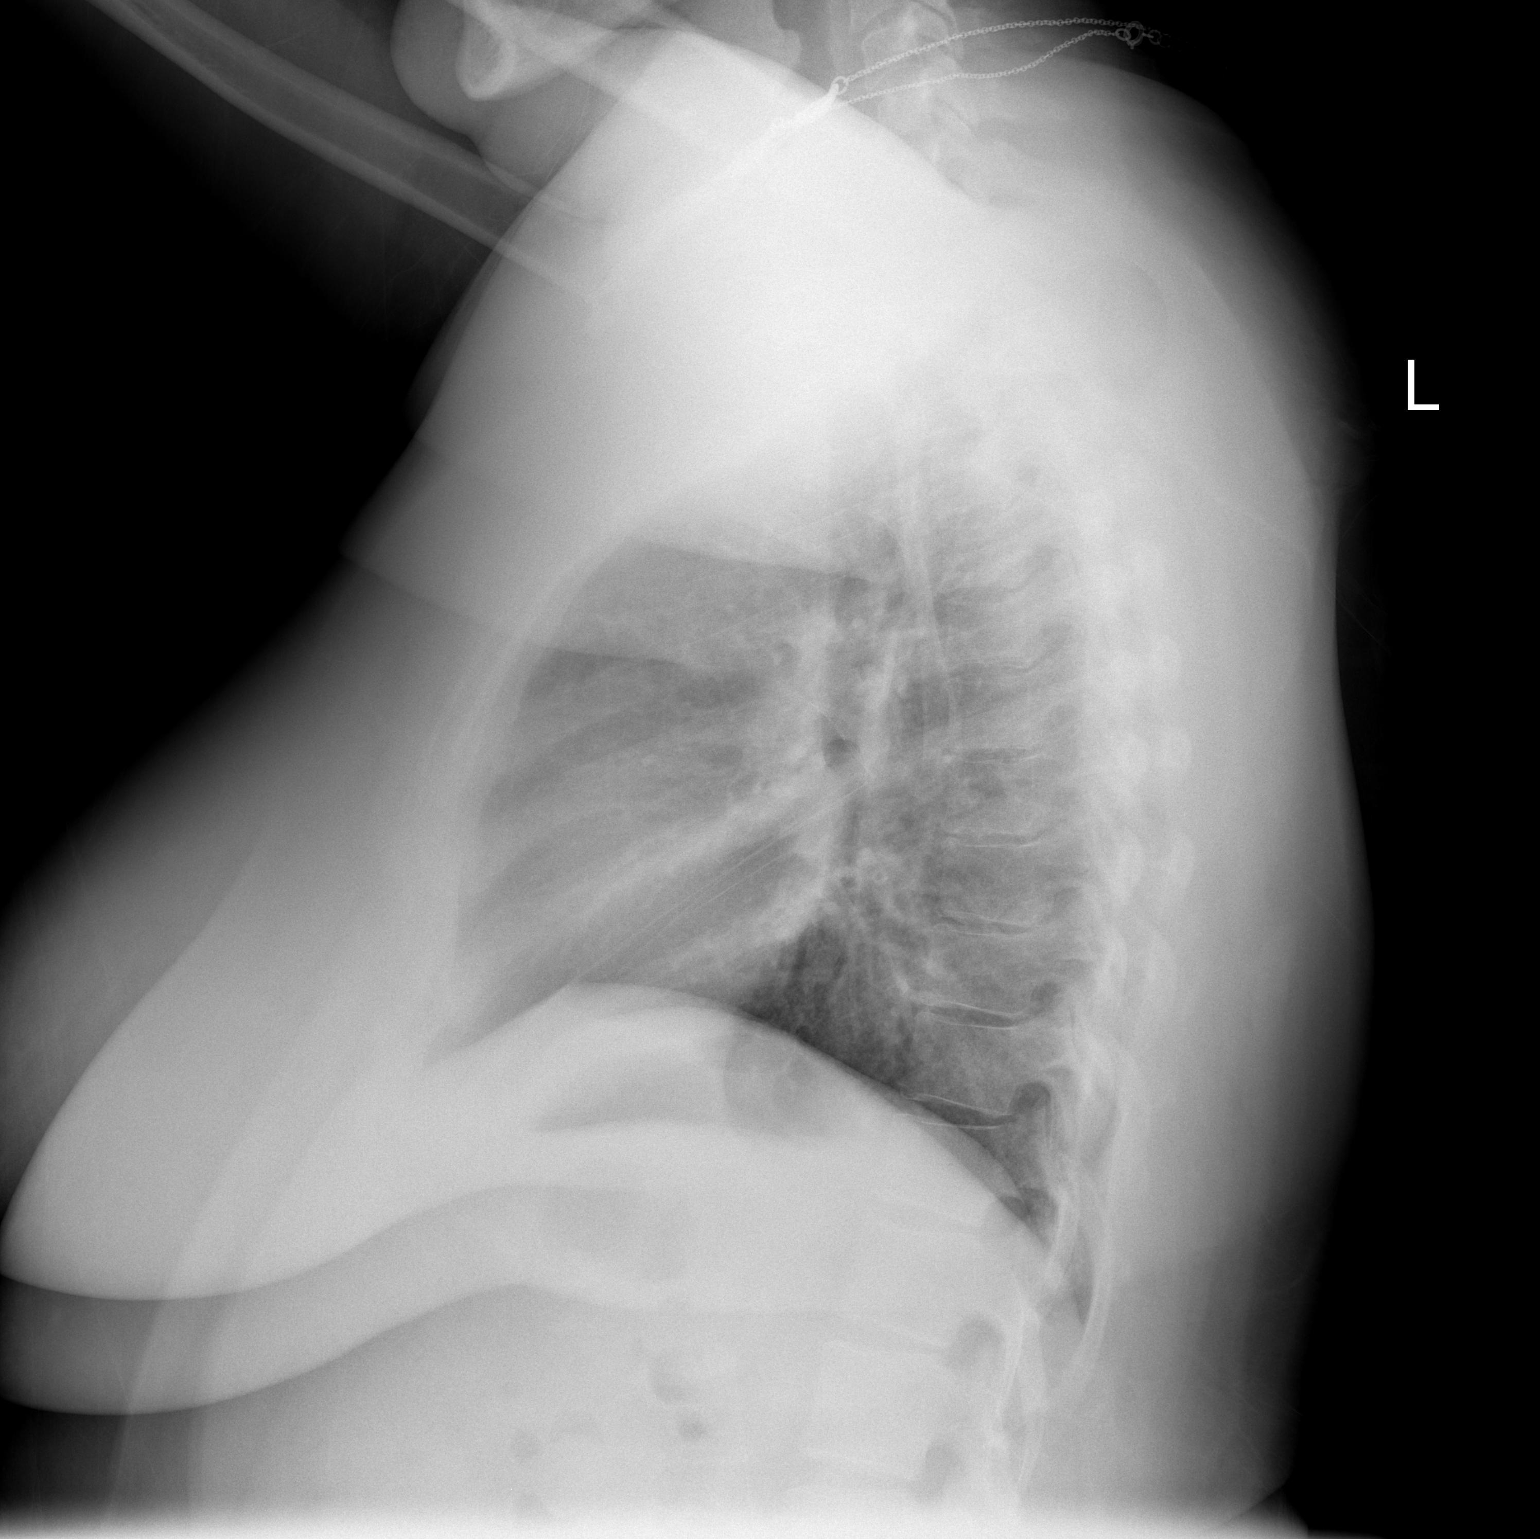

[2 of 2 positions shown; findings below may reference images not displayed]

FINDINGS: The heart size is normal. Mild parahilar bronchitic changes are
noted bilaterally. No focal airspace consolidation is evident. The
visualized soft tissues and bony thorax are unremarkable.
IMPRESSION: 1. Mild perihilar bronchitic changes most compatible with reactive
airways disease.
2. No focal airspace consolidation.

## 2013-12-28 MED ORDER — BENZONATATE 200 MG PO CAPS: 200.0000 mg | ORAL_CAPSULE | Freq: Three times a day (TID) | ORAL | Status: DC | PRN

## 2013-12-28 MED ORDER — HYDROCOD POLST-CHLORPHEN POLST 10-8 MG/5ML PO LQCR: 5.0000 mL | Freq: Every evening | ORAL | Status: DC | PRN

## 2013-12-28 MED ORDER — FLUTICASONE PROPIONATE 50 MCG/ACT NA SUSP: 2.0000 | Freq: Every day | NASAL | Status: DC

## 2013-12-28 NOTE — ED Notes (Signed)
Patient has a cough, congestion, she states that she thinks she is wheezing, and her chest is sore from coughing.

## 2013-12-28 NOTE — ED Provider Notes (Signed)
CSN: [id number]     Arrival date & time 12/28/13  1827 History   First MD Initiated Contact with Patient 12/28/13 1916     Chief Complaint  Patient presents with  . Wheezing     (Consider location/radiation/quality/duration/timing/severity/associated sxs/prior Treatment) Patient is a 43 y.o. female presenting with cough. The history is provided by the patient.  Cough Cough characteristics:  Productive Sputum characteristics:  Green Severity:  Moderate Onset quality:  Gradual Duration:  1 week Timing:  Constant Progression:  Unchanged Chronicity:  New Smoker: no   Context: upper respiratory infection   Relieved by:  Nothing Worsened by:  Nothing tried Ineffective treatments:  None tried Associated symptoms: no fever     Past Medical History  Diagnosis Date  . Anemia   . Constipation   . Obesity    Past Surgical History  Procedure Laterality Date  . Tubal ligation     No family history on file. History  Substance Use Topics  . Smoking status: Former Smoker    Types: Cigarettes    Quit date: 12/28/2010  . Smokeless tobacco: Not on file  . Alcohol Use: No   OB History   Grav Para Term Preterm Abortions TAB SAB Ect Mult Living                 Review of Systems  Constitutional: Negative for fever.  HENT: Positive for congestion. Negative for drooling.   Eyes: Negative for pain.  Respiratory: Positive for cough.   Gastrointestinal: Negative for nausea, vomiting, abdominal pain and diarrhea.  Genitourinary: Negative for dysuria and hematuria.  Musculoskeletal: Negative for back pain, gait problem and neck pain.  Skin: Negative for color change.  Neurological: Negative for dizziness.  Hematological: Negative for adenopathy.  Psychiatric/Behavioral: Negative for behavioral problems.  All other systems reviewed and are negative.     Allergies  Review of patient's allergies indicates no known allergies.  Home Medications   Prior to Admission medications    Medication Sig Start Date End Date Taking? Authorizing Provider  albuterol (PROVENTIL HFA;VENTOLIN HFA) 108 (90 BASE) MCG/ACT inhaler Inhale 2 puffs into the lungs every 4 (four) hours as needed for wheezing. 11/23/11 11/22/12  Darlyne Russian, MD  amoxicillin-clavulanate (AUGMENTIN) 875-125 MG per tablet Take 1 tablet by mouth every 12 (twelve) hours. 08/06/13   Gregor Hams, MD  benzonatate (TESSALON) 200 MG capsule Take 1 capsule (200 mg total) by mouth 3 (three) times daily as needed for cough. 12/28/13   Blanchard Kelch, MD  cetirizine (ZYRTEC) 10 MG tablet Take 10 mg by mouth daily as needed. For allergies.    Historical Provider, MD  chlorpheniramine-HYDROcodone (TUSSIONEX PENNKINETIC ER) 10-8 MG/5ML LQCR Take 5 mLs by mouth at bedtime as needed for cough. 12/28/13   Blanchard Kelch, MD  fluticasone (FLONASE) 50 MCG/ACT nasal spray Place 2 sprays into both nostrils daily. 12/28/13   Blanchard Kelch, MD  ipratropium (ATROVENT) 0.06 % nasal spray Place 2 sprays into both nostrils 4 (four) times daily. 08/06/13   Gregor Hams, MD  Multiple Vitamin (MULTIVITAMIN WITH MINERALS) TABS Take 1 tablet by mouth daily.    Historical Provider, MD  polyethylene glycol powder (GLYCOLAX/MIRALAX) powder Take 17 g by mouth 2 (two) times daily as needed. For constipation.    Historical Provider, MD  polyethylene glycol powder (GLYCOLAX/MIRALAX) powder MIX 17 GRAMS WITH LIQUID TWICE DAILY AS NEEDED 02/24/13   Areta Haber Dunn, PA-C  predniSONE (DELTASONE) 10 MG tablet Take 3  tablets (30 mg total) by mouth daily. 08/06/13   Gregor Hams, MD  traMADol (ULTRAM) 50 MG tablet Take 1 tablet (50 mg total) by mouth every 6 (six) hours as needed. 08/06/13   Gregor Hams, MD   BP 126/102  Pulse 104  Temp(Src) 98.5 F (36.9 C) (Oral)  Resp 18  SpO2 99% Physical Exam  Nursing note and vitals reviewed. Constitutional: She is oriented to person, place, and time. She appears well-developed and well-nourished.  HENT:  Head:  Normocephalic.  Mouth/Throat: Oropharynx is clear and moist. No oropharyngeal exudate.  TM's clear bilaterally.   Eyes: Conjunctivae and EOM are normal. Pupils are equal, round, and reactive to light.  Neck: Normal range of motion. Neck supple.  Cardiovascular: Normal rate, regular rhythm, normal heart sounds and intact distal pulses.  Exam reveals no gallop and no friction rub.   No murmur heard. Pulmonary/Chest: Effort normal and breath sounds normal. No respiratory distress. She has no wheezes.  Abdominal: Soft. Bowel sounds are normal. There is no tenderness. There is no rebound and no guarding.  Musculoskeletal: Normal range of motion. She exhibits no edema and no tenderness.  Neurological: She is alert and oriented to person, place, and time.  Skin: Skin is warm and dry.  Psychiatric: She has a normal mood and affect. Her behavior is normal.    ED Course  Procedures (including critical care time) Labs Review Labs Reviewed - No data to display  Imaging Review Dg Chest 2 View  12/28/2013   CLINICAL DATA:  Cough and congestion.  EXAM: CHEST  2 VIEW  COMPARISON:  11/23/2011  FINDINGS: The heart size is normal. Mild parahilar bronchitic changes are noted bilaterally. No focal airspace consolidation is evident. The visualized soft tissues and bony thorax are unremarkable.  IMPRESSION: 1. Mild perihilar bronchitic changes most compatible with reactive airways disease. 2. No focal airspace consolidation.   Electronically Signed   By: Lawrence Santiago M.D.   On: 12/28/2013 18:59     EKG Interpretation None      MDM   Final diagnoses:  Viral URI    7:53 PM 43 y.o. female who presents with cough and nasal congestion for one week. She denies any fevers. She states that she has had some green sputum. She is afebrile and mildly tachycardic here. Chest x-ray is noncontributory. She appears well on exam and lungs are clear bilaterally. Will recommend symptomatic treatment and will provide  prescriptions.  7:54 PM:  I have discussed the diagnosis/risks/treatment options with the patient and believe the pt to be eligible for discharge home to follow-up with pcp as needed. We also discussed returning to the ED immediately if new or worsening sx occur. We discussed the sx which are most concerning (e.g., sob, cp, vomiting, fever) that necessitate immediate return. Medications administered to the patient during their visit and any new prescriptions provided to the patient are listed below.  Medications given during this visit Medications - No data to display  New Prescriptions   BENZONATATE (TESSALON) 200 MG CAPSULE    Take 1 capsule (200 mg total) by mouth 3 (three) times daily as needed for cough.   CHLORPHENIRAMINE-HYDROCODONE (TUSSIONEX PENNKINETIC ER) 10-8 MG/5ML LQCR    Take 5 mLs by mouth at bedtime as needed for cough.   FLUTICASONE (FLONASE) 50 MCG/ACT NASAL SPRAY    Place 2 sprays into both nostrils daily.       Blanchard Kelch, MD 12/28/13 2328

## 2013-12-28 NOTE — Discharge Instructions (Signed)

## 2013-12-28 NOTE — ED Notes (Signed)
Assumed care of patient from Steve, RN.  

## 2014-01-02 ENCOUNTER — Emergency Department (INDEPENDENT_AMBULATORY_CARE_PROVIDER_SITE_OTHER)
Admission: EM | Admit: 2014-01-02 | Discharge: 2014-01-02 | Disposition: A | Discharge status: Home / Self Care | Payer: BC Managed Care – PPO | Source: Home / Self Care | Attending: Emergency Medicine | Admitting: Emergency Medicine

## 2014-01-02 ENCOUNTER — Encounter (HOSPITAL_COMMUNITY): Payer: Self-pay | Admitting: Emergency Medicine

## 2014-01-02 DIAGNOSIS — J209 Acute bronchitis, unspecified: Secondary | ICD-10-CM

## 2014-01-02 MED ORDER — CEFDINIR 300 MG PO CAPS: 300.0000 mg | ORAL_CAPSULE | Freq: Two times a day (BID) | ORAL | Status: DC

## 2014-01-02 MED ORDER — TRAMADOL HCL 50 MG PO TABS: 100.0000 mg | ORAL_TABLET | Freq: Three times a day (TID) | ORAL | Status: DC | PRN

## 2014-01-02 MED ORDER — METHYLPREDNISOLONE ACETATE 80 MG/ML IJ SUSP
INTRAMUSCULAR | Status: AC
Start: 2014-01-02 — End: 2014-01-02
  Filled 2014-01-02: qty 1

## 2014-01-02 MED ORDER — IPRATROPIUM BROMIDE 0.06 % NA SOLN: 2.0000 | Freq: Four times a day (QID) | NASAL | Status: DC

## 2014-01-02 MED ORDER — ALBUTEROL SULFATE HFA 108 (90 BASE) MCG/ACT IN AERS: 2.0000 | INHALATION_SPRAY | Freq: Four times a day (QID) | RESPIRATORY_TRACT | Status: DC

## 2014-01-02 MED ORDER — METHYLPREDNISOLONE ACETATE 80 MG/ML IJ SUSP
80.0000 mg | Freq: Once | INTRAMUSCULAR | Status: AC
  Administered 2014-01-02: 80 mg via INTRAMUSCULAR

## 2014-01-02 NOTE — ED Notes (Signed)
Cough, sinus congestion for 2 weeks.  Reports being seen at the [facility] [place] ed on Sunday diagnosed with viral infection and treated with flonase, tessalon, and tussinex.  Reports no improvement.

## 2014-01-02 NOTE — ED Provider Notes (Signed)
Chief Complaint   Chief Complaint  Patient presents with  . URI    History of Present Illness   Sandra Bishop is a 43 year old female who's had a three-week history of a persistent cough productive of clear to green sputum, wheezing, chest tightness, aching in her rib cage. She's also had nasal congestion with white to green nasal drainage, sinus pressure, headache, burning of the eyes, sweats, sore throat, nausea, and diminished appetite. She denies any fevers, vomiting, or diarrhea. She has no history of asthma. She's not a cigarette smoker. No history of allergies. She denies any reflux symptoms. She's had no sick exposures. She was at the emergency room this past Sunday where she had a chest x-ray. She was given Tussionex, Flonase, and benzonatate, but states she feels no better. The x-rays showed mild perihilar bronchitic changes compatible with reactive airways disease.  Review of Systems   Other than as noted above, the patient denies any of the following symptoms: Systemic:  No fevers, chills, sweats, or weight loss. ENT:  No nasal congestion, sneezing, itching, postnasal drip, sinus pressure, headache, sore throat, or hoarseness. Lungs:  No wheezing, shortness of breath, chest tightness or congestion. Heart:  No chest pain, tightness, pressure, PND, orthopnea, or ankle edema. GI:  No indigestion, heartburn, waterbrash, burping, abdominal pain, nausea, or vomiting.  [place]   Past medical history, family history, social history, meds, and allergies were reviewed.   Physical Examination     Vital signs:  BP 134/96  Pulse 94  Temp(Src) 97.8 F (36.6 C) (Oral)  Resp 22  SpO2 97%  LMP 12/03/2013 General:  Alert and oriented.  In no distress.  Skin warm and dry. ENT: TMs and ear canals normal.  Nasal mucosa normal, without drainage.  Pharynx clear without exudate or drainage.  No intraoral lesions. Neck:  No adenopathy, tenderness or mass.  No JVD. Lungs:  No respiratory  distress.  Breath sounds clear and equal bilaterally.  No wheezes, rales or rhonchi. Heart:  Regular rhythm, no gallops or murmers.  No pedal edema. Abdomon:  Soft and nontender.  No organomegaly or mass.   Course in Urgent [place]   The following medications were given:  Medications  methylPREDNISolone acetate (DEPO-MEDROL) injection 80 mg (80 mg Intramuscular Given 01/02/14 1959)   Assessment   The encounter diagnosis was Acute bronchitis, unspecified organism.  Acute bronchitis with reactive airways disease.  Plan     1.  Meds:  The following meds were prescribed:   Discharge Medication List as of 01/02/2014  7:51 PM    START taking these medications   Details  cefdinir (OMNICEF) 300 MG capsule Take 1 capsule (300 mg total) by mouth 2 (two) times daily., Starting 01/02/2014, Until Discontinued, Normal    !! ipratropium (ATROVENT) 0.06 % nasal spray Place 2 sprays into both nostrils 4 (four) times daily., Starting 01/02/2014, Until Discontinued, Normal    !! traMADol (ULTRAM) 50 MG tablet Take 2 tablets (100 mg total) by mouth every 8 (eight) hours as needed., Starting 01/02/2014, Until Discontinued, Normal     !! - Potential duplicate medications found. Please discuss with provider.      2.  Patient Education/Counseling:  The patient was given appropriate handouts, self care instructions, and instructed in symptomatic relief.    3.  Follow up:  The patient was told to follow up here if no better in 3 to 4 days, or sooner if becoming worse in any way, and given some red flag  symptoms such as difficulty breathing or chest pain which would prompt immediate return.        Harden Mo, MD 01/02/14 [id number]

## 2014-01-02 NOTE — Discharge Instructions (Signed)
Most upper respiratory infections are caused by viruses and do not require antibiotics.  We try to save the antibiotics for when we really need them to prevent bacteria from developing resistance to them.  Here are a few hints about things that can be done at home to help get over an upper respiratory infection quicker: ° °Get extra sleep and extra fluids.  Get 7 to 9 hours of sleep per night and 6 to 8 glasses of water a day.  Getting extra sleep keeps the immune system from getting run down.  Most people with an upper respiratory infection are a little dehydrated.  The extra fluids also keep the secretions liquified and easier to deal with.  Also, get extra vitamin C.  4000 mg per day is the recommended dose. °For the aches, headache, and fever, acetaminophen or ibuprofen are helpful.  These can be alternated every 4 hours.  People with liver disease should avoid large amounts of acetaminophen, and people with ulcer disease, gastroesophageal reflux, gastritis, congestive heart failure, chronic kidney disease, coronary artery disease and the elderly should avoid ibuprofen. °For nasal congestion try Mucinex-D, or if you're having lots of sneezing or clear nasal drainage use Zyrtec-D. People with high blood pressure can take these if their blood pressure is controlled, if not, it's best to avoid the forms with a "D" (decongestants).  You can use the plain Mucinex, Allegra, Claritin, or Zyrtec even if your blood pressure is not controlled.   °A Saline nasal spray such as Ocean Spray can also help.  You can add a decongestant sprays such as Afrin, but you should not use the decongestant sprays for more than 3 or 4 days since they can be habituating.  Breathe Rite nasal strips can also offer a non-drug alternative treatment to nasal congestion, especially at night. °For people with symptoms of sinusitis, sleeping with your head elevated can be helpful.  For sinus pain, moist, hot compresses to the face may provide some  relief.  Many people find that inhaling steam as in a shower or from a pot of steaming water can help. °For any viral infection, zinc containing lozenges such as Cold-Eze or Zicam are helpful.  Zinc helps to fight viral infection.  Hot salt water gargles (8 oz of hot water, 1/2 tsp of table salt, and a pinch of baking soda) can give relief as well as hot beverages such as hot tea.  Sucrets extra strength lozenges will help the sore throat.  °For the cough, take Delsym 2 tsp every 12 hours.  It has also been found recently that Aleve can help control a cough.  The dose is 1 to 2 tablets twice daily with food.  This can be combined with Delsym. (Note, if you are taking ibuprofen, you should not take Aleve as well--take one or the other.) °A cool mist vaporizer will help keep your mucous membranes from drying out.  ° °It's important when you have an upper respiratory infection not to pass the infection to others.  This involves being very careful about the following: ° °Frequent hand washing or use of hand sanitizer, especially after coughing, sneezing, blowing your nose or touching your face, nose or eyes. °Do not shake hands or touch anyone and try to avoid touching surfaces that other people use such as doorknobs, shopping carts, telephones and computer keyboards. °Use tissues and dispose of them properly in a garbage can or ziplock bag. °Cough into your sleeve. °Do not let others eat or   drink after you. ° °It's also important to recognize the signs of serious illness and get evaluated if they occur: °Any respiratory infection that lasts more than 7 to 10 days.  Yellow nasal drainage and sputum are not reliable indicators of a bacterial infection, but if they last for more than 1 week, see your doctor. °Fever and sore throat can indicate strep. °Fever and cough can indicate influenza or pneumonia. °Any kind of severe symptom such as difficulty breathing, intractable vomiting, or severe pain should prompt you to see  a doctor as soon as possible. ° ° °Your body's immune system is really the thing that will get rid of this infection.  Your immune system is comprised of 2 types of specialized cells called T cells and B cells.  T cells coordinate the array of cells in your body that engulf invading bacteria or viruses while B cells orchestrate the production of antibodies that neutralize infection.  Anything we do or any medications we give you, will just strengthen your immune system or help it clear up the infection quicker.  Here are a few helpful hints to improve your immune system to help overcome this illness or to prevent future infections: °· A few vitamins can improve the health of your immune system.  That's why your diet should include plenty of fruits, vegetables, fish, nuts, and whole grains. °· Vitamin A and bet-carotene can increase the cells that fight infections (T cells and B cells).  Vitamin A is abundant in dark greens and orange vegetables such as spinach, greens, sweet potatoes, and carrots. °· Vitamin B6 contributes to the maturation of white blood cells, the cells that fight disease.  Foods with vitamin B6 include cold cereal and bananas. °· Vitamin C is credited with preventing colds because it increases white blood cells and also prevents cellular damage.  Citrus fruits, peaches and green and red bell peppers are all hight in vitamin C. °· Vitamin E is an anti-oxidant that encourages the production of natural killer cells which reject foreign invaders and B cells that produce antibodies.  Foods high in vitamin E include wheat germ, nuts and seeds. °· Foods high in omega-3 fatty acids found in foods like salmon, tuna and mackerel boost your immune system and help cells to engulf and absorb germs. °· Probiotics are good bacteria that increase your T cells.  These can be found in yogurt and are available in supplements such as Culturelle or Align. °· Moderate exercise increases the strength of your immune  system and your ability to recover from illness.  I suggest 3 to 5 moderate intensity 30 minute workouts per week.   °· Sleep is another component of maintaining a strong immune system.  It enables your body to recuperate from the day's activities, stress and work.  My recommendation is to get between 7 and 9 hours of sleep per night. °· If you smoke, try to quit completely or at least cut down.  Drink alcohol only in moderation if at all.  No more than 2 drinks daily for men or 1 for women. °· Get a flu vaccine early in the fall or if you have not gotten one yet, once this illness has run its course.  If you are over 65, a smoker, or an asthmatic, get a pneumococcal vaccine. °· My final recommendation is to maintain a healthy weight.  Excess weight can impair the immune system by interfering with the way the immune system deals with invading viruses or   bacteria.    Bronchitis Bronchitis is inflammation of the airways that extend from the windpipe into the lungs (bronchi). The inflammation often causes mucus to develop, which leads to a cough. If the inflammation becomes severe, it may cause shortness of breath. CAUSES  Bronchitis may be caused by:   Viral infections.   Bacteria.   Cigarette smoke.   Allergens, pollutants, and other irritants.  SIGNS AND SYMPTOMS  The most common symptom of bronchitis is a frequent cough that produces mucus. Other symptoms include:  Fever.   Body aches.   Chest congestion.   Chills.   Shortness of breath.   Sore throat.  DIAGNOSIS  Bronchitis is usually diagnosed through a medical history and physical exam. Tests, such as chest X-rays, are sometimes done to rule out other conditions.  TREATMENT  You may need to avoid contact with whatever caused the problem (smoking, for example). Medicines are sometimes needed. These may include:  Antibiotics. These may be prescribed if the condition is caused by bacteria.  Cough suppressants. These  may be prescribed for relief of cough symptoms.   Inhaled medicines. These may be prescribed to help open your airways and make it easier for you to breathe.   Steroid medicines. These may be prescribed for those with recurrent (chronic) bronchitis. HOME CARE INSTRUCTIONS  Get plenty of rest.   Drink enough fluids to keep your urine clear or pale yellow (unless you have a medical condition that requires fluid restriction). Increasing fluids may help thin your secretions and will prevent dehydration.   Only take over-the-counter or prescription medicines as directed by your health care provider.  Only take antibiotics as directed. Make sure you finish them even if you start to feel better.  Avoid secondhand smoke, irritating chemicals, and strong fumes. These will make bronchitis worse. If you are a smoker, quit smoking. Consider using nicotine gum or skin patches to help control withdrawal symptoms. Quitting smoking will help your lungs heal faster.   Put a cool-mist humidifier in your bedroom at night to moisten the air. This may help loosen mucus. Change the water in the humidifier daily. You can also run the hot water in your shower and sit in the bathroom with the door closed for 5-10 minutes.   Follow up with your health care provider as directed.   Wash your hands frequently to avoid catching bronchitis again or spreading an infection to others.  SEEK MEDICAL CARE IF: Your symptoms do not improve after 1 week of treatment.  SEEK IMMEDIATE MEDICAL CARE IF:  Your fever increases.  You have chills.   You have chest pain.   You have worsening shortness of breath.   You have bloody sputum.  You faint.  You have lightheadedness.  You have a severe headache.   You vomit repeatedly. MAKE SURE YOU:   Understand these instructions.  Will watch your condition.  Will get help right away if you are not doing well or get worse. Document Released: 07/03/2005  Document Revised: 04/23/2013 Document Reviewed: 02/25/2013 [facility] Patient Information 2015 [place], [place]. This information is not intended to replace advice given to you by your health care provider. Make sure you discuss any questions you have with your health care provider. How to Use an Inhaler Proper inhaler technique is very important. Good technique ensures that the medicine reaches the lungs. Poor technique results in depositing the medicine on the tongue and back of the throat rather than in the airways. If you do not  use the inhaler with good technique, the medicine will not help you. STEPS TO FOLLOW IF USING AN INHALER WITHOUT AN EXTENSION TUBE 1. Remove the cap from the inhaler. 2. If you are using the inhaler for the first time, you will need to prime it. Shake the inhaler for 5 seconds and release four puffs into the air, away from your face. Ask your health care provider or pharmacist if you have questions about priming your inhaler. 3. Shake the inhaler for 5 seconds before each breath in (inhalation). 4. Position the inhaler so that the top of the canister faces up. 5. Put your index finger on the top of the medicine canister. Your thumb supports the bottom of the inhaler. 6. Open your mouth. 7. Either place the inhaler between your teeth and place your lips tightly around the mouthpiece, or hold the inhaler 1-2 inches away from your open mouth. If you are unsure of which technique to use, ask your health care provider. 8. Breathe out (exhale) normally and as completely as possible. 9. Press the canister down with your index finger to release the medicine. 10. At the same time as the canister is pressed, inhale deeply and slowly until your lungs are completely filled. This should take 4-6 seconds. Keep your tongue down. 11. Hold the medicine in your lungs for 5-10 seconds (10 seconds is best). This helps the medicine get into the small airways of your lungs. 12. Breathe out  slowly, through pursed lips. Whistling is an example of pursed lips. 13. Wait at least 15-30 seconds between puffs. Continue with the above steps until you have taken the number of puffs your health care provider has ordered. Do not use the inhaler more than your health care provider tells you. 14. Replace the cap on the inhaler. 15. Follow the directions from your health care provider or the inhaler insert for cleaning the inhaler. STEPS TO FOLLOW IF USING AN INHALER WITH AN EXTENSION (SPACER) 1. Remove the cap from the inhaler. 2. If you are using the inhaler for the first time, you will need to prime it. Shake the inhaler for 5 seconds and release four puffs into the air, away from your face. Ask your health care provider or pharmacist if you have questions about priming your inhaler. 3. Shake the inhaler for 5 seconds before each breath in (inhalation). 4. Place the open end of the spacer onto the mouthpiece of the inhaler. 5. Position the inhaler so that the top of the canister faces up and the spacer mouthpiece faces you. 6. Put your index finger on the top of the medicine canister. Your thumb supports the bottom of the inhaler and the spacer. 7. Breathe out (exhale) normally and as completely as possible. 8. Immediately after exhaling, place the spacer between your teeth and into your mouth. Close your lips tightly around the spacer. 9. Press the canister down with your index finger to release the medicine. 10. At the same time as the canister is pressed, inhale deeply and slowly until your lungs are completely filled. This should take 4-6 seconds. Keep your tongue down and out of the way. 11. Hold the medicine in your lungs for 5-10 seconds (10 seconds is best). This helps the medicine get into the small airways of your lungs. Exhale. 12. Repeat inhaling deeply through the spacer mouthpiece. Again hold that breath for up to 10 seconds (10 seconds is best). Exhale slowly. If it is difficult to  take this second deep breath  through the spacer, breathe normally several times through the spacer. Remove the spacer from your mouth. 13. Wait at least 15-30 seconds between puffs. Continue with the above steps until you have taken the number of puffs your health care provider has ordered. Do not use the inhaler more than your health care provider tells you. 14. Remove the spacer from the inhaler, and place the cap on the inhaler. 15. Follow the directions from your health care provider or the inhaler insert for cleaning the inhaler and spacer. If you are using different kinds of inhalers, use your quick relief medicine to open the airways 10-15 minutes before using a steroid if instructed to do so by your health care provider. If you are unsure which inhalers to use and the order of using them, ask your health care provider, nurse, or respiratory therapist. If you are using a steroid inhaler, always rinse your mouth with water after your last puff, then gargle and spit out the water. Do not swallow the water. AVOID:  Inhaling before or after starting the spray of medicine. It takes practice to coordinate your breathing with triggering the spray.  Inhaling through the nose (rather than the mouth) when triggering the spray. HOW TO DETERMINE IF YOUR INHALER IS FULL OR NEARLY EMPTY You cannot know when an inhaler is empty by shaking it. A few inhalers are now being made with dose counters. Ask your health care provider for a prescription that has a dose counter if you feel you need that extra help. If your inhaler does not have a counter, ask your health care provider to help you determine the date you need to refill your inhaler. Write the refill date on a calendar or your inhaler canister. Refill your inhaler 7-10 days before it runs out. Be sure to keep an adequate supply of medicine. This includes making sure it is not expired, and that you have a spare inhaler.  SEEK MEDICAL CARE IF:   Your  symptoms are only partially relieved with your inhaler.  You are having trouble using your inhaler.  You have some increase in phlegm. SEEK IMMEDIATE MEDICAL CARE IF:   You feel little or no relief with your inhalers. You are still wheezing and are feeling shortness of breath or tightness in your chest or both.  You have dizziness, headaches, or a fast heart rate.  You have chills, fever, or night sweats.  You have a noticeable increase in phlegm production, or there is blood in the phlegm. MAKE SURE YOU:   Understand these instructions.  Will watch your condition.  Will get help right away if you are not doing well or get worse. Document Released: 06/30/2000 Document Revised: 04/23/2013 Document Reviewed: 01/30/2013 [facility] Patient Information 2015 [place], [place]. This information is not intended to replace advice given to you by your health care provider. Make sure you discuss any questions you have with your health care provider.

## 2014-01-12 ENCOUNTER — Encounter (HOSPITAL_COMMUNITY): Payer: Self-pay | Admitting: Emergency Medicine

## 2014-01-12 ENCOUNTER — Emergency Department (INDEPENDENT_AMBULATORY_CARE_PROVIDER_SITE_OTHER): Payer: BC Managed Care – PPO

## 2014-01-12 ENCOUNTER — Emergency Department (INDEPENDENT_AMBULATORY_CARE_PROVIDER_SITE_OTHER)
Admission: EM | Admit: 2014-01-12 | Discharge: 2014-01-12 | Disposition: A | Discharge status: Home / Self Care | Payer: BC Managed Care – PPO | Source: Home / Self Care | Attending: Family Medicine | Admitting: Family Medicine

## 2014-01-12 DIAGNOSIS — J4521 Mild intermittent asthma with (acute) exacerbation: Secondary | ICD-10-CM

## 2014-01-12 DIAGNOSIS — J45901 Unspecified asthma with (acute) exacerbation: Secondary | ICD-10-CM

## 2014-01-12 IMAGING — CR DG CHEST 2V
2 series · 2 of 2 positions shown · non-contrast
Comparison: [DATE]

CLINICAL DATA: Productive cough for 3 weeks

EXAM:
CHEST  2 VIEW

[view not recorded (1 of 2)]
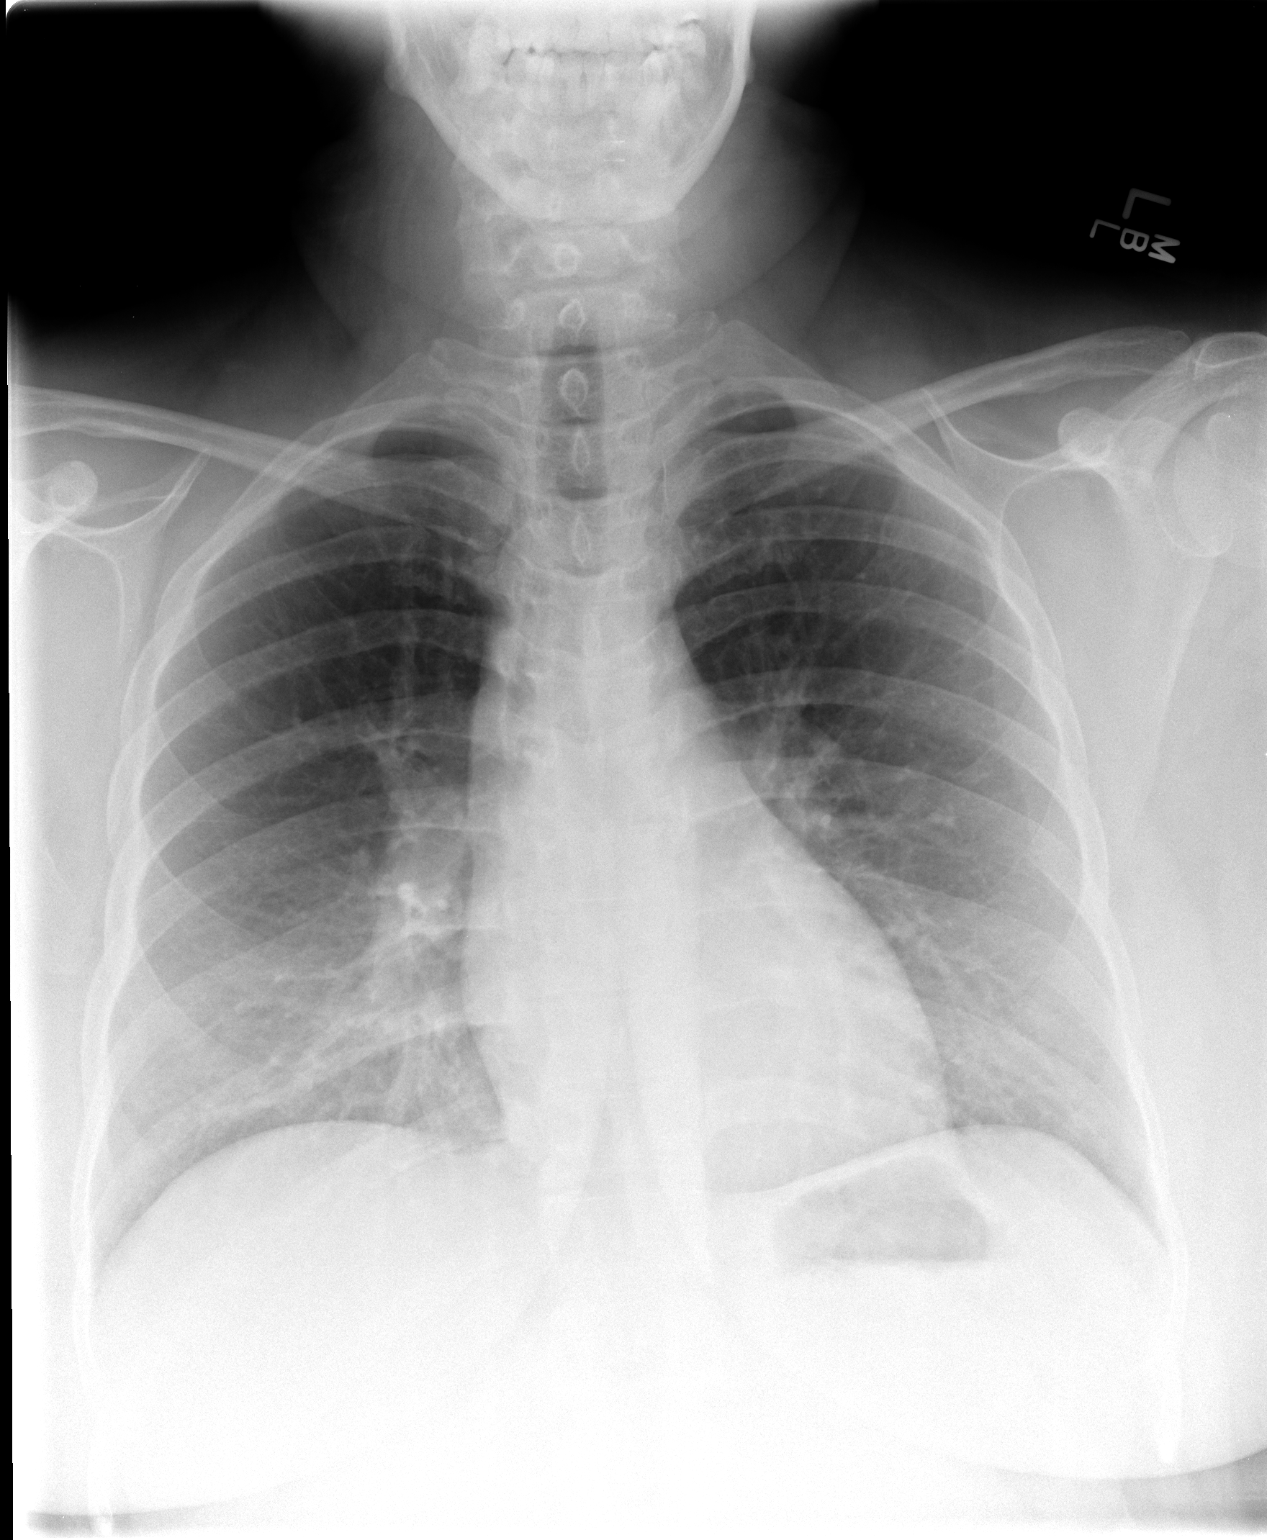

[view not recorded (2 of 2)]
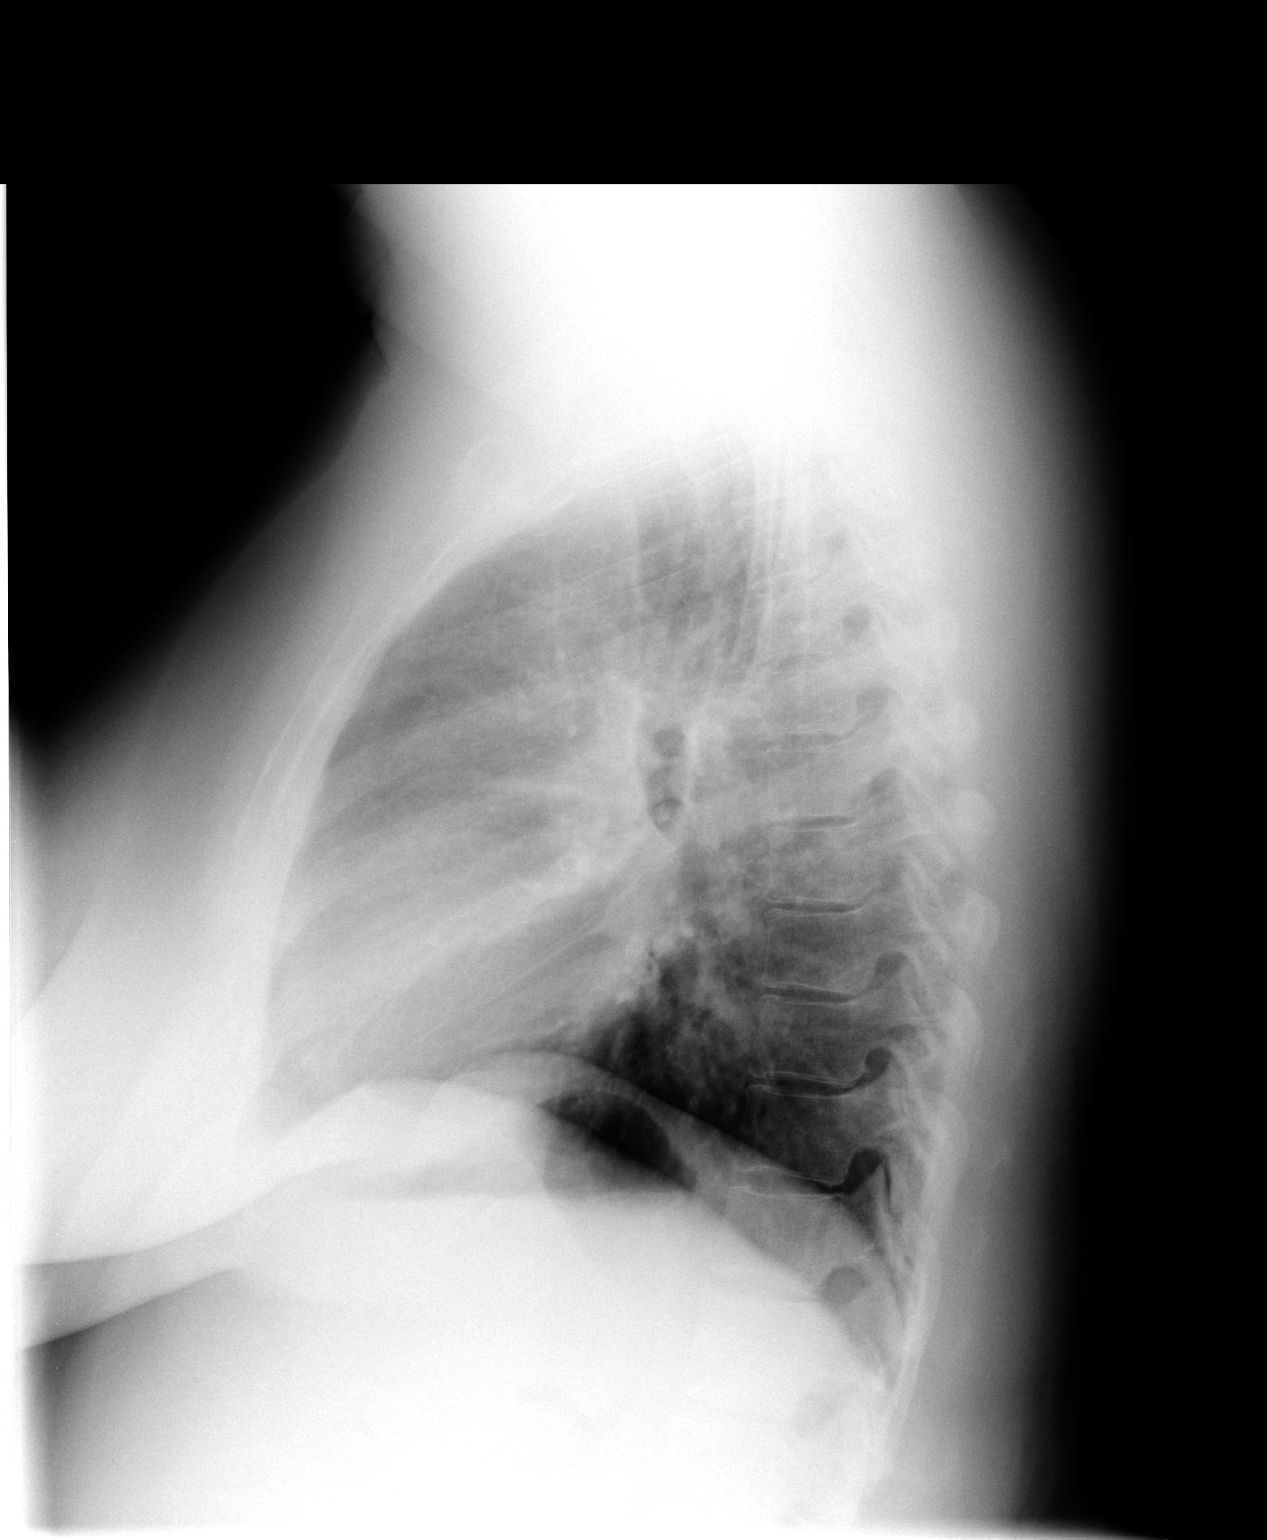

[2 of 2 positions shown; findings below may reference images not displayed]

FINDINGS: The heart size and mediastinal contours are within normal limits.
Both lungs are clear. The visualized skeletal structures are
unremarkable.
IMPRESSION: No active cardiopulmonary disease.

## 2014-01-12 MED ORDER — IPRATROPIUM BROMIDE 0.02 % IN SOLN
0.5000 mg | Freq: Once | RESPIRATORY_TRACT | Status: AC
  Administered 2014-01-12: 0.5 mg via RESPIRATORY_TRACT

## 2014-01-12 MED ORDER — GI COCKTAIL ~~LOC~~
30.0000 mL | Freq: Once | ORAL | Status: AC
  Administered 2014-01-12: 30 mL via ORAL

## 2014-01-12 MED ORDER — METHYLPREDNISOLONE SODIUM SUCC 125 MG IJ SOLR
INTRAMUSCULAR | Status: AC
  Filled 2014-01-12: qty 2

## 2014-01-12 MED ORDER — ALBUTEROL SULFATE (2.5 MG/3ML) 0.083% IN NEBU
INHALATION_SOLUTION | RESPIRATORY_TRACT | Status: AC
  Filled 2014-01-12: qty 6

## 2014-01-12 MED ORDER — HYDROCOD POLST-CHLORPHEN POLST 10-8 MG/5ML PO LQCR: 5.0000 mL | Freq: Two times a day (BID) | ORAL | Status: DC | PRN

## 2014-01-12 MED ORDER — SODIUM CHLORIDE 0.9 % IJ SOLN
INTRAMUSCULAR | Status: AC
  Filled 2014-01-12: qty 6

## 2014-01-12 MED ORDER — METHYLPREDNISOLONE 4 MG PO KIT: PACK | ORAL | Status: DC

## 2014-01-12 MED ORDER — IPRATROPIUM BROMIDE 0.02 % IN SOLN
RESPIRATORY_TRACT | Status: AC
  Filled 2014-01-12: qty 2.5

## 2014-01-12 MED ORDER — GI COCKTAIL ~~LOC~~
ORAL | Status: AC
  Filled 2014-01-12: qty 30

## 2014-01-12 MED ORDER — METHYLPREDNISOLONE SODIUM SUCC 125 MG IJ SOLR
125.0000 mg | Freq: Once | INTRAMUSCULAR | Status: AC
  Administered 2014-01-12: 125 mg via INTRAMUSCULAR

## 2014-01-12 MED ORDER — ALBUTEROL SULFATE (2.5 MG/3ML) 0.083% IN NEBU
5.0000 mg | INHALATION_SOLUTION | Freq: Once | RESPIRATORY_TRACT | Status: AC
  Administered 2014-01-12: 5 mg via RESPIRATORY_TRACT

## 2014-01-12 NOTE — Discharge Instructions (Signed)
Take 2 tbsp of mylanta at bedtime

## 2014-01-12 NOTE — ED Provider Notes (Signed)
CSN: [id number]     Arrival date & time 01/12/14  1804 History   First MD Initiated Contact with Patient 01/12/14 1822     Chief Complaint  Patient presents with  . Cough   (Consider location/radiation/quality/duration/timing/severity/associated sxs/prior Treatment) Patient is a 43 y.o. female presenting with cough. The history is provided by the patient.  Cough Cough characteristics:  Dry and harsh Severity:  Moderate Onset quality:  Gradual Duration:  3 weeks Progression:  Unchanged Chronicity:  New (3rd visit this mo to md for rx of cough.) Smoker: no   Relieved by:  Nothing Ineffective treatments:  Cough suppressants and beta-agonist inhaler Associated symptoms: chest pain, shortness of breath and wheezing   Associated symptoms: no fever and no rhinorrhea     Past Medical History  Diagnosis Date  . Anemia   . Constipation   . Obesity    Past Surgical History  Procedure Laterality Date  . Tubal ligation     History reviewed. No pertinent family history. History  Substance Use Topics  . Smoking status: Former Smoker    Types: Cigarettes    Quit date: 12/28/2010  . Smokeless tobacco: Not on file  . Alcohol Use: No   OB History   Grav Para Term Preterm Abortions TAB SAB Ect Mult Living                 Review of Systems  Constitutional: Negative.  Negative for fever.  HENT: Negative.  Negative for rhinorrhea.   Respiratory: Positive for cough, shortness of breath and wheezing.   Cardiovascular: Positive for chest pain. Negative for leg swelling.    Allergies  Review of patient's allergies indicates no known allergies.  Home Medications   Prior to Admission medications   Medication Sig Start Date End Date Taking? Authorizing Provider  albuterol (PROVENTIL HFA;VENTOLIN HFA) 108 (90 BASE) MCG/ACT inhaler Inhale 2 puffs into the lungs every 4 (four) hours as needed for wheezing. 11/23/11 11/22/12  Darlyne Russian, MD  albuterol (PROVENTIL HFA;VENTOLIN HFA) 108 (90  BASE) MCG/ACT inhaler Inhale 2 puffs into the lungs 4 (four) times daily. 01/02/14   Harden Mo, MD  amoxicillin-clavulanate (AUGMENTIN) 875-125 MG per tablet Take 1 tablet by mouth every 12 (twelve) hours. 08/06/13   Gregor Hams, MD  benzonatate (TESSALON) 200 MG capsule Take 1 capsule (200 mg total) by mouth 3 (three) times daily as needed for cough. 12/28/13   Blanchard Kelch, MD  cefdinir (OMNICEF) 300 MG capsule Take 1 capsule (300 mg total) by mouth 2 (two) times daily. 01/02/14   Harden Mo, MD  cetirizine (ZYRTEC) 10 MG tablet Take 10 mg by mouth daily as needed. For allergies.    Historical Provider, MD  chlorpheniramine-HYDROcodone (TUSSIONEX PENNKINETIC ER) 10-8 MG/5ML LQCR Take 5 mLs by mouth at bedtime as needed for cough. 12/28/13   Blanchard Kelch, MD  chlorpheniramine-HYDROcodone (TUSSIONEX PENNKINETIC ER) 10-8 MG/5ML LQCR Take 5 mLs by mouth every 12 (twelve) hours as needed for cough. 01/12/14   Billy Fischer, MD  fluticasone (FLONASE) 50 MCG/ACT nasal spray Place 2 sprays into both nostrils daily. 12/28/13   Blanchard Kelch, MD  ipratropium (ATROVENT) 0.06 % nasal spray Place 2 sprays into both nostrils 4 (four) times daily. 08/06/13   Gregor Hams, MD  ipratropium (ATROVENT) 0.06 % nasal spray Place 2 sprays into both nostrils 4 (four) times daily. 01/02/14   Harden Mo, MD  methylPREDNISolone (MEDROL DOSEPAK) 4 MG tablet follow  package directions take until finished, start on tues. 01/12/14   Billy Fischer, MD  Multiple Vitamin (MULTIVITAMIN WITH MINERALS) TABS Take 1 tablet by mouth daily.    Historical Provider, MD  polyethylene glycol powder (GLYCOLAX/MIRALAX) powder Take 17 g by mouth 2 (two) times daily as needed. For constipation.    Historical Provider, MD  polyethylene glycol powder (GLYCOLAX/MIRALAX) powder MIX 17 GRAMS WITH LIQUID TWICE DAILY AS NEEDED 02/24/13   Areta Haber Dunn, PA-C  predniSONE (DELTASONE) 10 MG tablet Take 3 tablets (30 mg total) by mouth daily.  08/06/13   Gregor Hams, MD  traMADol (ULTRAM) 50 MG tablet Take 1 tablet (50 mg total) by mouth every 6 (six) hours as needed. 08/06/13   Gregor Hams, MD  traMADol (ULTRAM) 50 MG tablet Take 2 tablets (100 mg total) by mouth every 8 (eight) hours as needed. 01/02/14   Harden Mo, MD   BP 134/86  Pulse 90  Temp(Src) 98.5 F (36.9 C) (Oral)  Resp 14  SpO2 100%  LMP 12/29/2013 Physical Exam  Nursing note and vitals reviewed. Constitutional: She is oriented to person, place, and time. She appears well-developed and well-nourished.  HENT:  Head: Normocephalic and atraumatic.  Right Ear: External ear normal.  Left Ear: External ear normal.  Mouth/Throat: Oropharynx is clear and moist.  Neck: Normal range of motion. Neck supple.  Cardiovascular: Regular rhythm, normal heart sounds and intact distal pulses.   Pulmonary/Chest: No respiratory distress. She has wheezes. She has no rales. She exhibits tenderness.  Lymphadenopathy:    She has no cervical adenopathy.  Neurological: She is alert and oriented to person, place, and time.  Skin: Skin is warm and dry.    ED Course  Procedures (including critical care time) Labs Review Labs Reviewed - No data to display  Imaging Review Dg Chest 2 View  01/12/2014   CLINICAL DATA:  Productive cough for 3 weeks  EXAM: CHEST  2 VIEW  COMPARISON:  12/28/2013  FINDINGS: The heart size and mediastinal contours are within normal limits. Both lungs are clear. The visualized skeletal structures are unremarkable.  IMPRESSION: No active cardiopulmonary disease.   Electronically Signed   By: Kathreen Devoid   On: 01/12/2014 19:43   X-rays reviewed and report per radiologist.   MDM   1. Asthmatic bronchitis with exacerbation, mild intermittent    Lungs clear, sx resolved at time of d/c, very pleased.    Billy Fischer, MD 01/12/14 2044

## 2014-01-12 NOTE — ED Notes (Signed)
C/o continued cough, congestion, hard to sleep at night because of cough, sore in ribs and abdominal area, wetting her pants she is coughing so much

## 2014-01-23 ENCOUNTER — Other Ambulatory Visit: Payer: Self-pay

## 2014-01-23 DIAGNOSIS — Z1231 Encounter for screening mammogram for malignant neoplasm of breast: Secondary | ICD-10-CM

## 2014-01-30 ENCOUNTER — Ambulatory Visit
Admission: RE | Admit: 2014-01-30 | Discharge: 2014-01-30 | Disposition: A | Discharge status: Home / Self Care | Payer: BC Managed Care – PPO | Source: Ambulatory Visit

## 2014-01-30 ENCOUNTER — Encounter (INDEPENDENT_AMBULATORY_CARE_PROVIDER_SITE_OTHER): Payer: Self-pay

## 2014-01-30 DIAGNOSIS — Z1231 Encounter for screening mammogram for malignant neoplasm of breast: Secondary | ICD-10-CM

## 2014-01-30 IMAGING — MG MM SCREEN MAMMOGRAM BILATERAL
6 series · 6 of 6 positions shown · non-contrast
Comparison: Previous exam(s).

CLINICAL DATA: Screening.

EXAM:
DIGITAL SCREENING BILATERAL MAMMOGRAM WITH CAD

[R CC (1 of 2)]
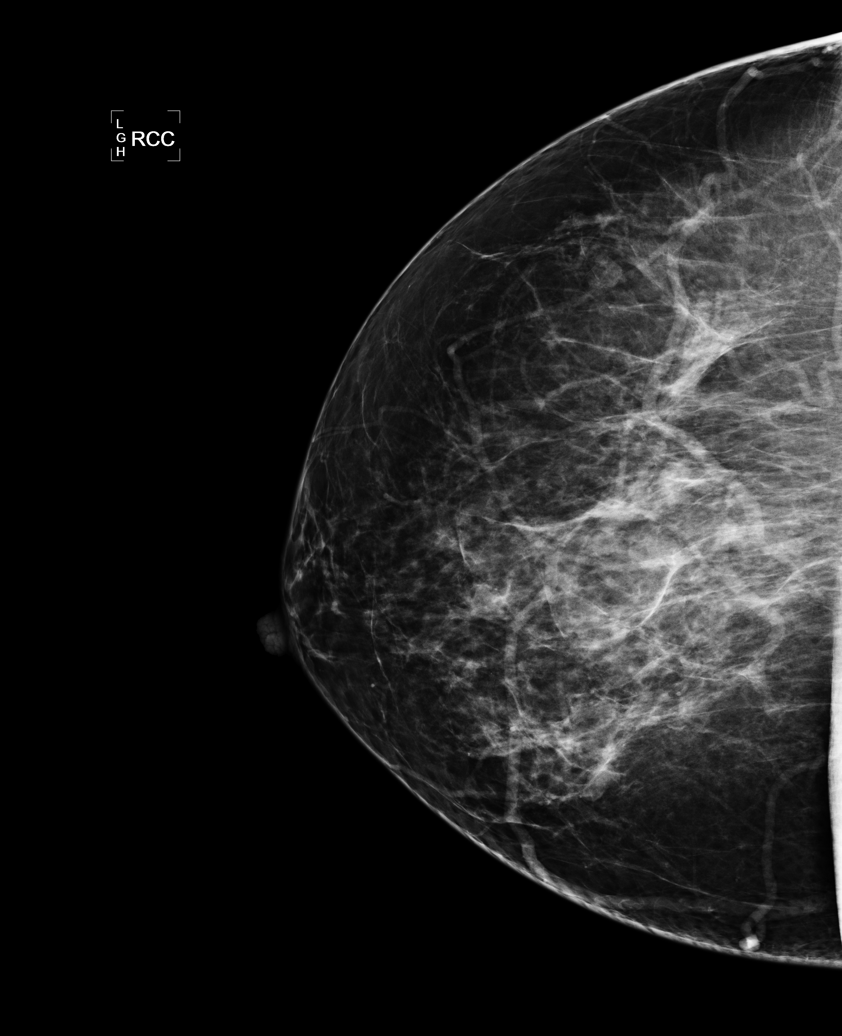

[L CC]
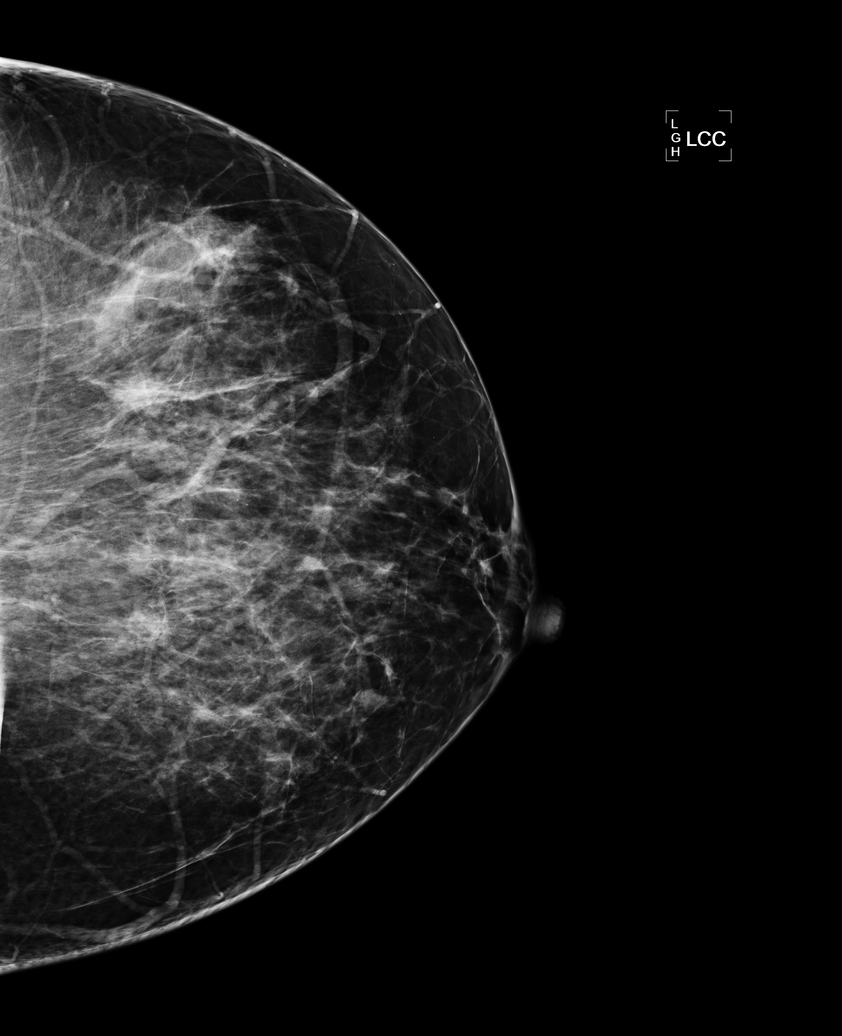

[L MLO]
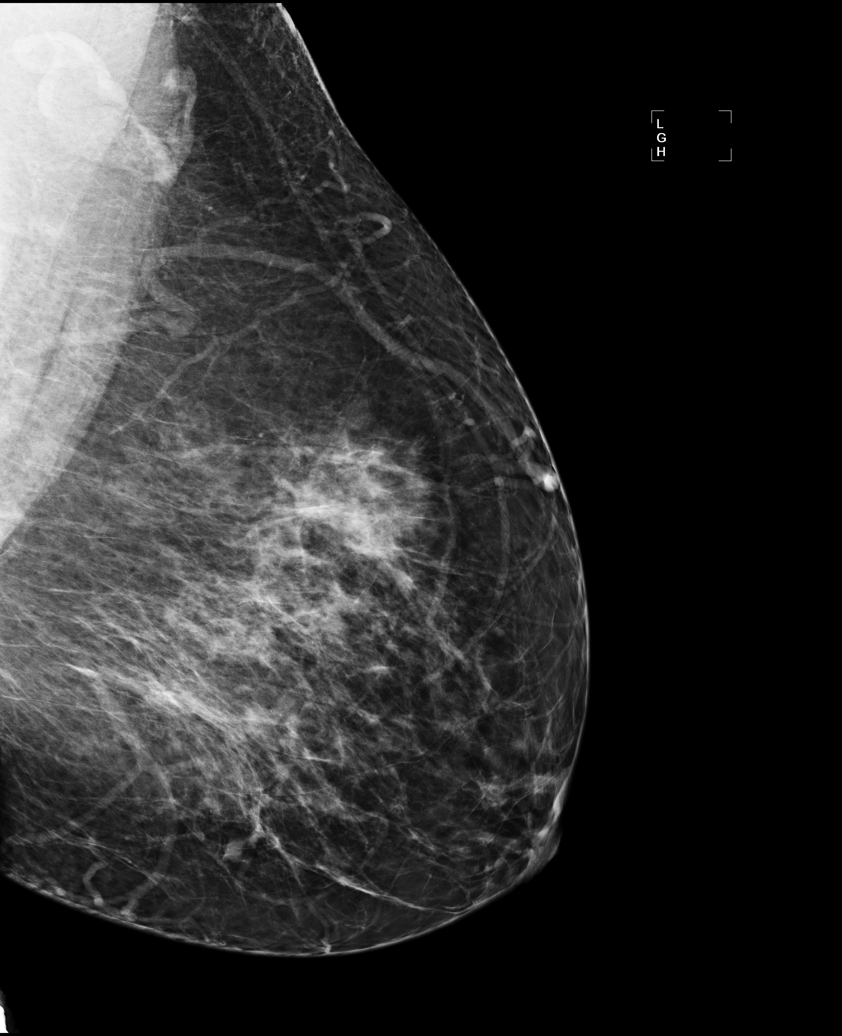

[R MLO (1 of 2)]
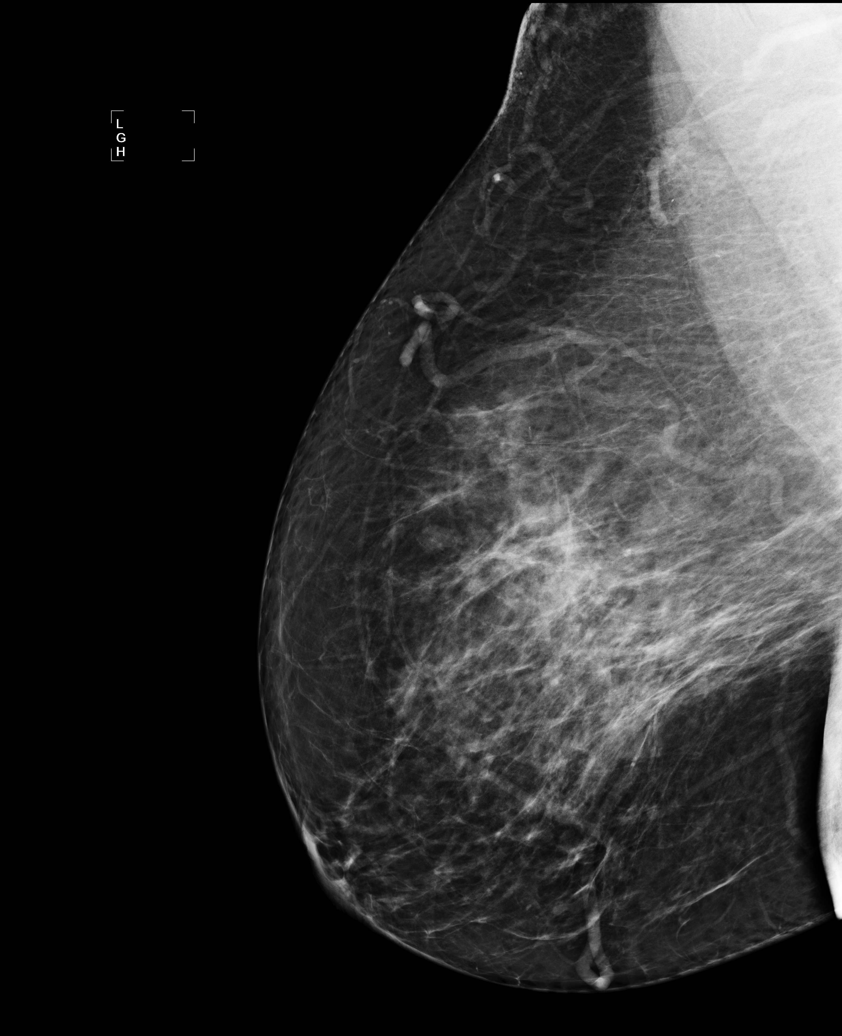

[R MLO (2 of 2)]
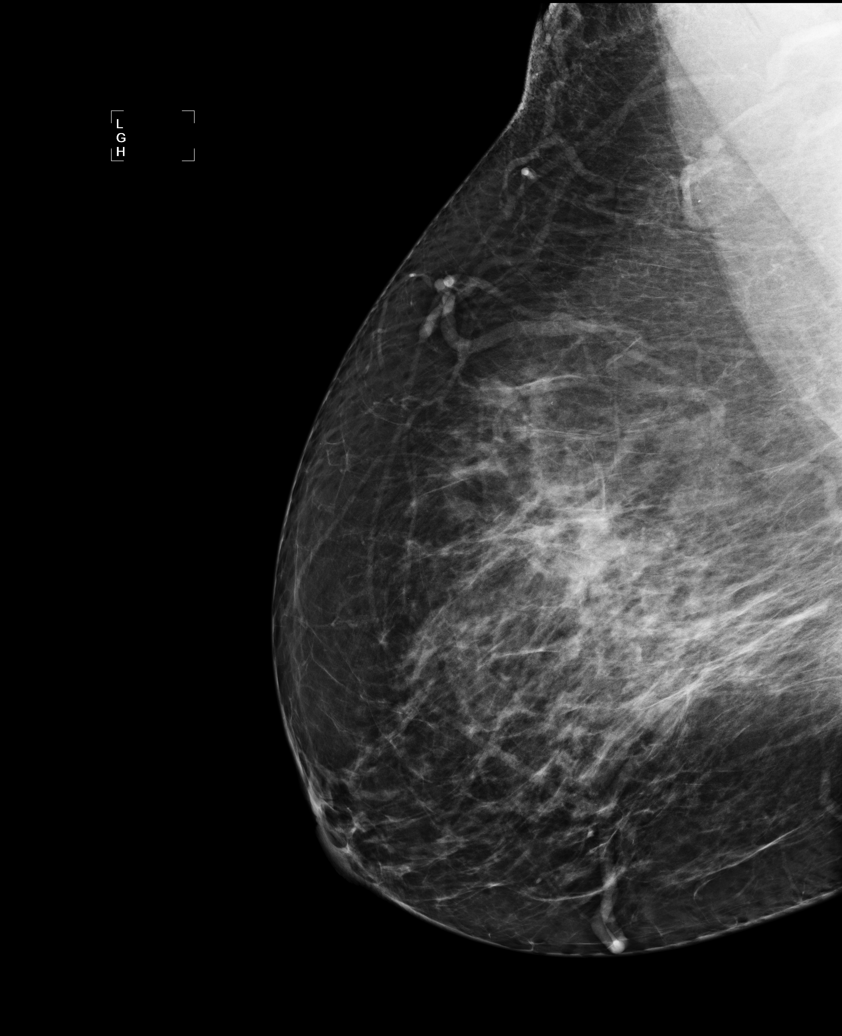

[R CC (2 of 2)]
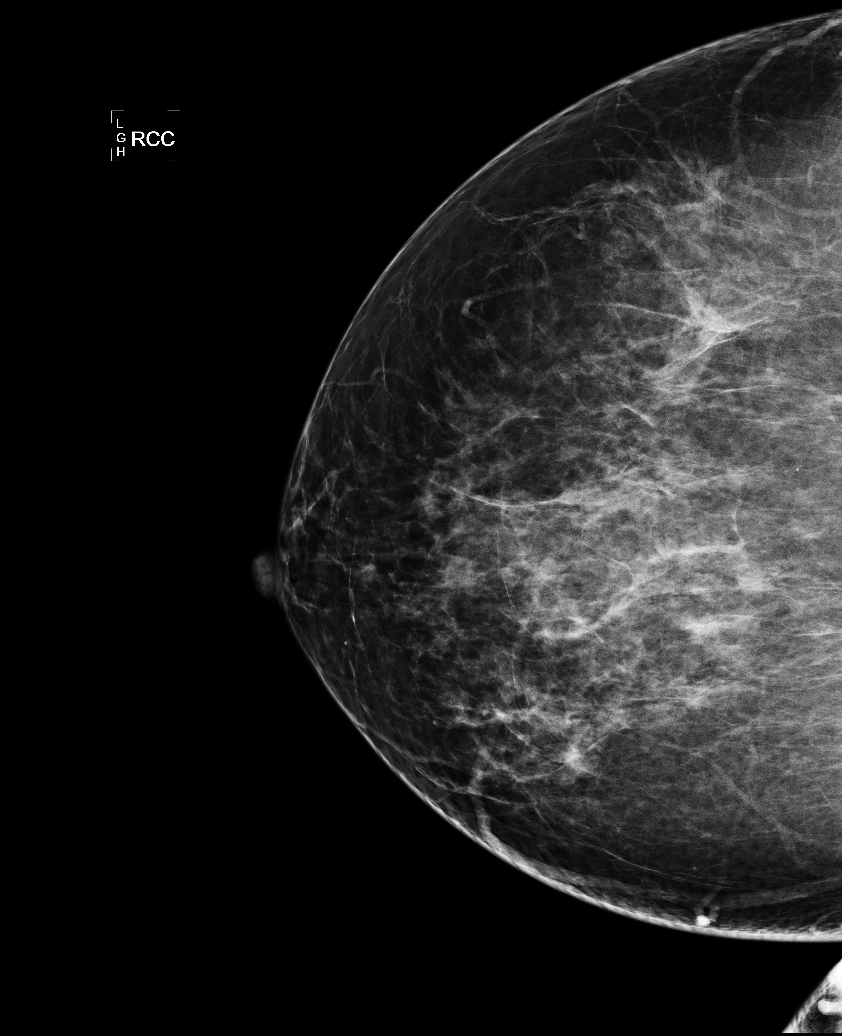

[6 of 6 positions shown; findings below may reference images not displayed]

ACR Breast Density Category c: The breast tissue is heterogeneously
dense, which may obscure small masses.
FINDINGS: There are no findings suspicious for malignancy. Images were
processed with CAD.
IMPRESSION: No mammographic evidence of malignancy. A result letter of this
screening mammogram will be mailed directly to the patient.

RECOMMENDATION:
Screening mammogram in one year. (Code:[0J])

BI-RADS CATEGORY  1: Negative.

## 2014-10-16 ENCOUNTER — Ambulatory Visit
Admission: RE | Admit: 2014-10-16 | Discharge: 2014-10-16 | Disposition: A | Discharge status: Home / Self Care | Payer: BC Managed Care – PPO | Source: Ambulatory Visit

## 2014-10-16 ENCOUNTER — Other Ambulatory Visit: Payer: Self-pay

## 2014-10-16 DIAGNOSIS — Z1231 Encounter for screening mammogram for malignant neoplasm of breast: Secondary | ICD-10-CM

## 2015-02-01 ENCOUNTER — Other Ambulatory Visit: Payer: Self-pay

## 2015-02-01 ENCOUNTER — Ambulatory Visit
Admission: RE | Admit: 2015-02-01 | Discharge: 2015-02-01 | Disposition: A | Discharge status: Home / Self Care | Payer: BC Managed Care – PPO | Source: Ambulatory Visit

## 2015-02-01 IMAGING — MG MM DIGITAL SCREENING BILAT W/ CAD
4 series · 4 of 4 positions shown · non-contrast
Comparison: Previous exam(s).

CLINICAL DATA: Screening.

EXAM:
DIGITAL SCREENING BILATERAL MAMMOGRAM WITH CAD

[R CC]
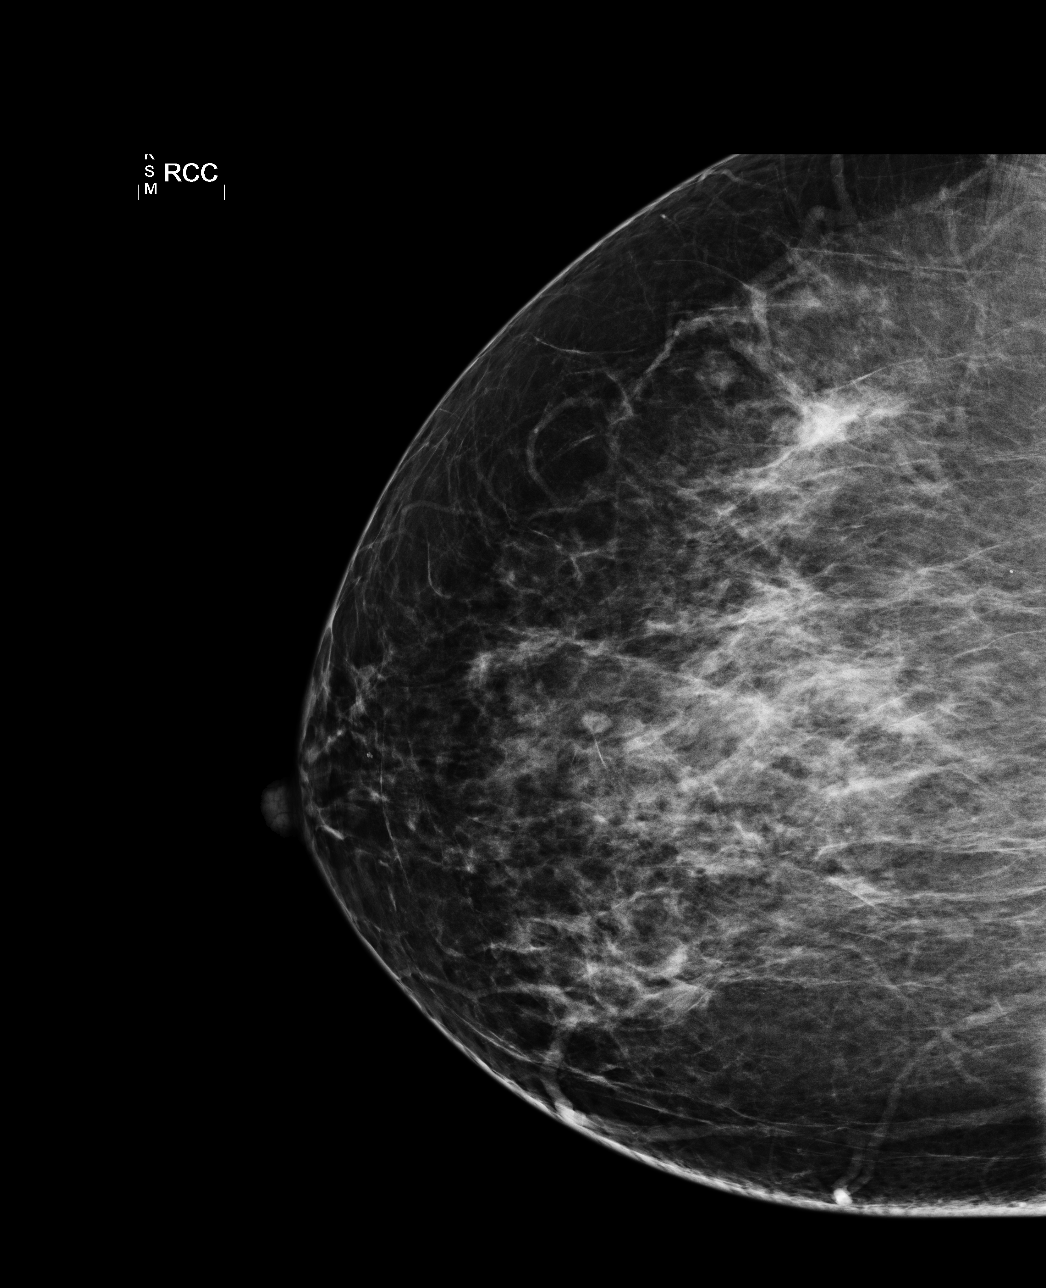

[L CC]
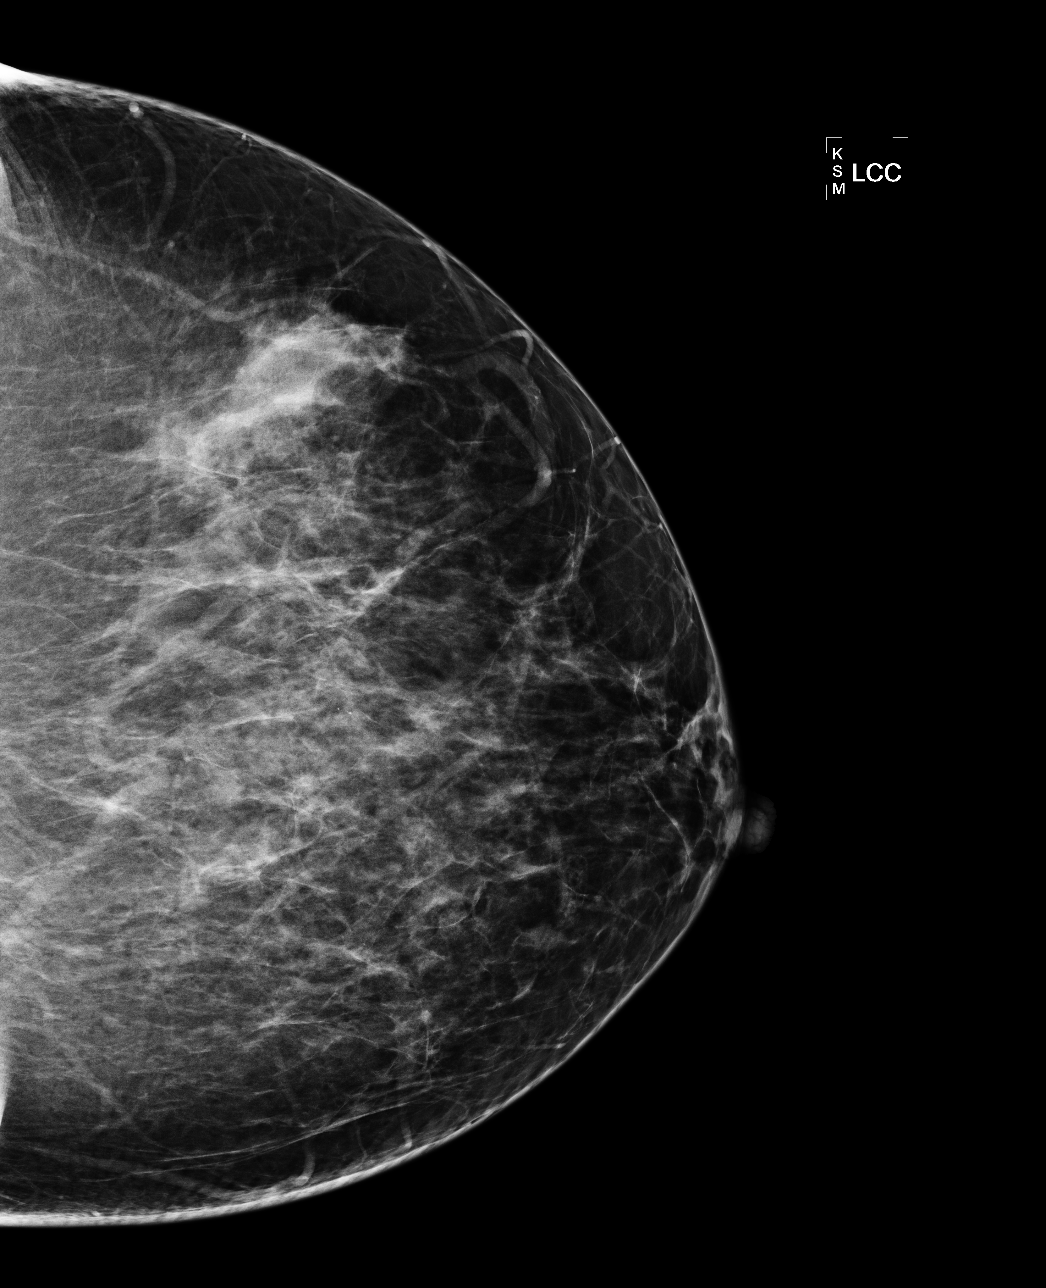

[L MLO]
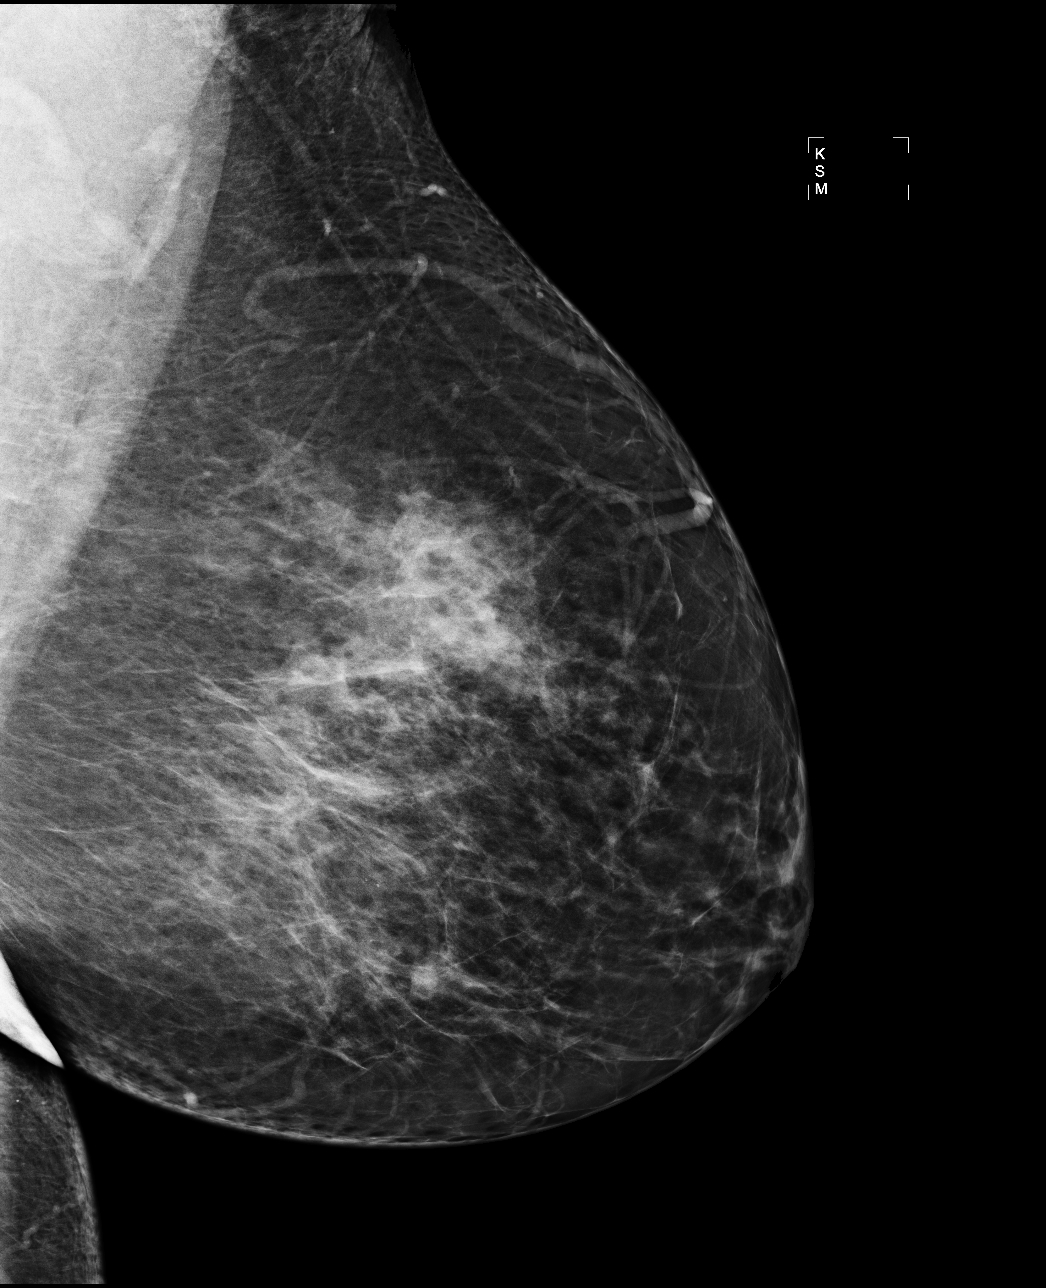

[R MLO]
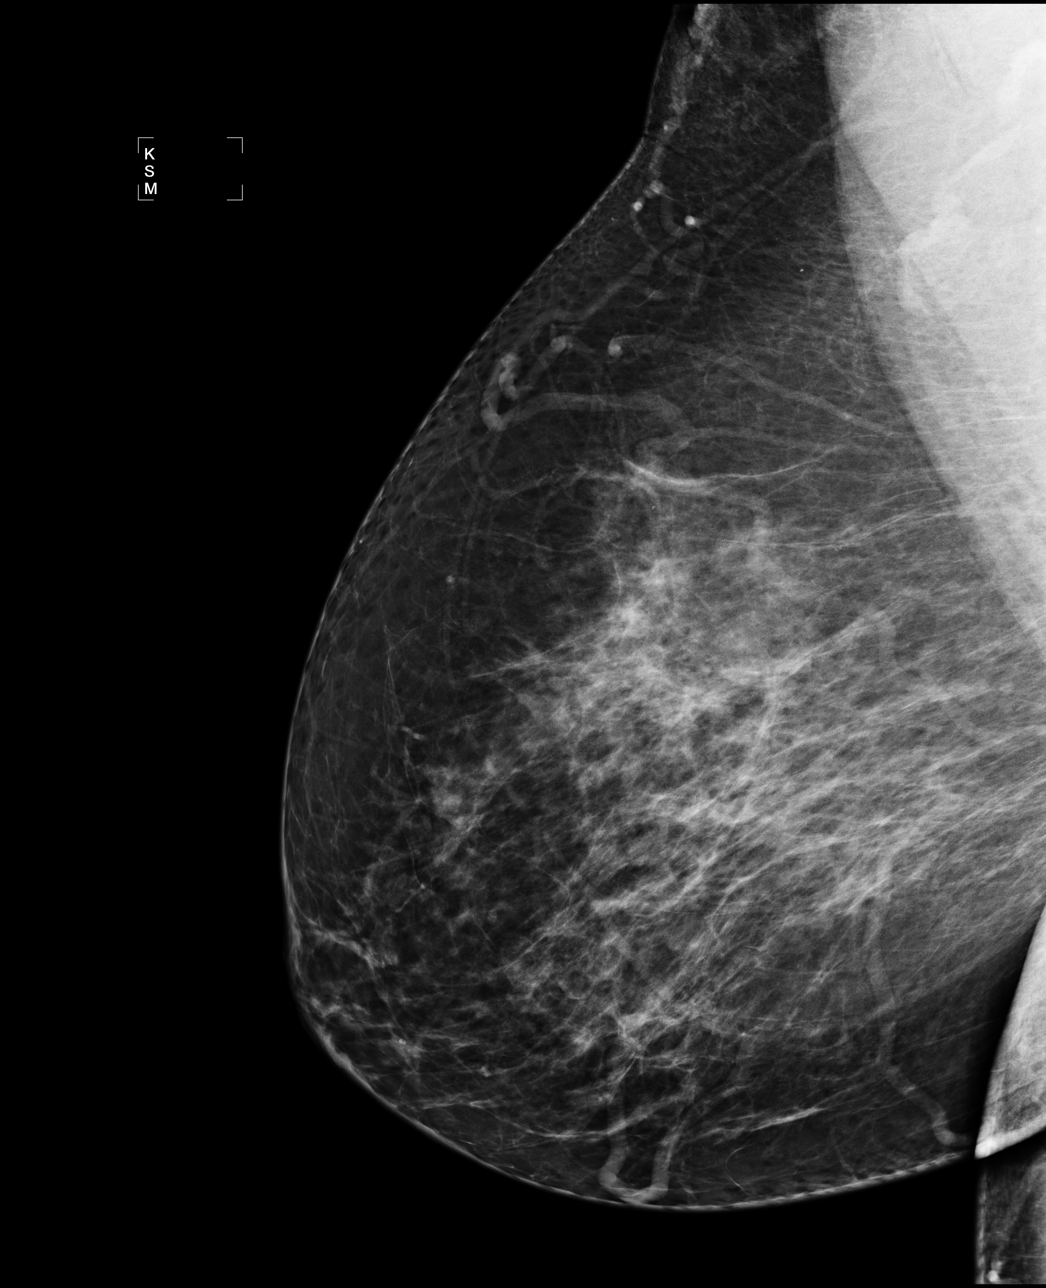

[4 of 4 positions shown; findings below may reference images not displayed]

ACR Breast Density Category c: The breast tissue is heterogeneously
dense, which may obscure small masses.
FINDINGS: There are no findings suspicious for malignancy. Images were
processed with CAD.
IMPRESSION: No mammographic evidence of malignancy. A result letter of this
screening mammogram will be mailed directly to the patient.

RECOMMENDATION:
Screening mammogram in one year. (Code:[0J])

BI-RADS CATEGORY  1: Negative.

## 2015-02-18 ENCOUNTER — Emergency Department (HOSPITAL_BASED_OUTPATIENT_CLINIC_OR_DEPARTMENT_OTHER)
Admission: EM | Admit: 2015-02-18 | Discharge: 2015-02-18 | Disposition: A | Discharge status: Home / Self Care | Payer: BC Managed Care – PPO | Attending: Emergency Medicine | Admitting: Emergency Medicine

## 2015-02-18 ENCOUNTER — Encounter (HOSPITAL_BASED_OUTPATIENT_CLINIC_OR_DEPARTMENT_OTHER): Payer: Self-pay

## 2015-02-18 DIAGNOSIS — Z87891 Personal history of nicotine dependence: Secondary | ICD-10-CM | POA: Insufficient documentation

## 2015-02-18 DIAGNOSIS — Z862 Personal history of diseases of the blood and blood-forming organs and certain disorders involving the immune mechanism: Secondary | ICD-10-CM | POA: Diagnosis not present

## 2015-02-18 DIAGNOSIS — N760 Acute vaginitis: Secondary | ICD-10-CM | POA: Diagnosis not present

## 2015-02-18 DIAGNOSIS — Z7951 Long term (current) use of inhaled steroids: Secondary | ICD-10-CM | POA: Insufficient documentation

## 2015-02-18 DIAGNOSIS — E669 Obesity, unspecified: Secondary | ICD-10-CM | POA: Insufficient documentation

## 2015-02-18 DIAGNOSIS — Z3202 Encounter for pregnancy test, result negative: Secondary | ICD-10-CM | POA: Diagnosis not present

## 2015-02-18 DIAGNOSIS — K5900 Constipation, unspecified: Secondary | ICD-10-CM | POA: Diagnosis not present

## 2015-02-18 DIAGNOSIS — Z79899 Other long term (current) drug therapy: Secondary | ICD-10-CM | POA: Insufficient documentation

## 2015-02-18 DIAGNOSIS — B9689 Other specified bacterial agents as the cause of diseases classified elsewhere: Secondary | ICD-10-CM

## 2015-02-18 DIAGNOSIS — N898 Other specified noninflammatory disorders of vagina: Secondary | ICD-10-CM | POA: Diagnosis present

## 2015-02-18 LAB — URINALYSIS, ROUTINE W REFLEX MICROSCOPIC
Bilirubin Urine: NEGATIVE
Glucose, UA: NEGATIVE mg/dL
Hgb urine dipstick: NEGATIVE
Ketones, ur: NEGATIVE mg/dL
LEUKOCYTES UA: NEGATIVE
Nitrite: NEGATIVE
Protein, ur: NEGATIVE mg/dL
SPECIFIC GRAVITY, URINE: 1.022 (ref 1.005–1.030)
Urobilinogen, UA: 0.2 mg/dL (ref 0.0–1.0)
pH: 6 (ref 5.0–8.0)

## 2015-02-18 LAB — WET PREP, GENITAL
Trich, Wet Prep: NONE SEEN
Yeast Wet Prep HPF POC: NONE SEEN

## 2015-02-18 LAB — PREGNANCY, URINE: Preg Test, Ur: NEGATIVE

## 2015-02-18 MED ORDER — METRONIDAZOLE 500 MG PO TABS: 500.0000 mg | ORAL_TABLET | Freq: Two times a day (BID) | ORAL | Status: DC

## 2015-02-18 NOTE — ED Notes (Signed)
MD at bedside. 

## 2015-02-18 NOTE — ED Provider Notes (Signed)
CSN: [id number]     Arrival date & time 02/18/15  1628 History   First MD Initiated Contact with Patient 02/18/15 1633     Chief Complaint  Patient presents with  . Vaginal Discharge     (Consider location/radiation/quality/duration/timing/severity/associated sxs/prior Treatment) HPI Comments: 44 year old female complaining of vaginal discharge and itching 6 days. States 6 days ago she was out to eat at a restaurant and accidentally sat on the toilet seat, and shortly after developed vaginal discharge and itching. She believes she contracted an infection from the toilet seat. States she has been abstinent from sexual intercourse for 3 years. Immediately after sitting on the toilet, she went home and applied [place] in order to shave all the hairs that may have touched the toilet seat. No aggravating or alleviating factors. States her vagina seems irritated. Denies abdominal pain, nausea, vomiting, fever, chills. LMP 02/08/2015. Denies increased urinary frequency, urgency, dysuria.  Patient is a 44 y.o. female presenting with vaginal discharge. The history is provided by the patient.  Vaginal Discharge   Past Medical History  Diagnosis Date  . Anemia   . Constipation   . Obesity    Past Surgical History  Procedure Laterality Date  . Tubal ligation     No family history on file. History  Substance Use Topics  . Smoking status: Former Smoker    Types: Cigarettes    Quit date: 12/28/2010  . Smokeless tobacco: Not on file  . Alcohol Use: No   OB History    No data available     Review of Systems  Genitourinary: Positive for vaginal discharge.  All other systems reviewed and are negative.     Allergies  Review of patient's allergies indicates no known allergies.  Home Medications   Prior to Admission medications   Medication Sig Start Date End Date Taking? Authorizing Provider  albuterol (PROVENTIL HFA;VENTOLIN HFA) 108 (90 BASE) MCG/ACT inhaler Inhale 2 puffs into the  lungs every 4 (four) hours as needed for wheezing. 11/23/11 11/22/12  Darlyne Russian, MD  albuterol (PROVENTIL HFA;VENTOLIN HFA) 108 (90 BASE) MCG/ACT inhaler Inhale 2 puffs into the lungs 4 (four) times daily. 01/02/14   Harden Mo, MD  amoxicillin-clavulanate (AUGMENTIN) 875-125 MG per tablet Take 1 tablet by mouth every 12 (twelve) hours. 08/06/13   Gregor Hams, MD  benzonatate (TESSALON) 200 MG capsule Take 1 capsule (200 mg total) by mouth 3 (three) times daily as needed for cough. 12/28/13   Pamella Pert, MD  cefdinir (OMNICEF) 300 MG capsule Take 1 capsule (300 mg total) by mouth 2 (two) times daily. 01/02/14   Harden Mo, MD  cetirizine (ZYRTEC) 10 MG tablet Take 10 mg by mouth daily as needed. For allergies.    Historical Provider, MD  chlorpheniramine-HYDROcodone (TUSSIONEX PENNKINETIC ER) 10-8 MG/5ML LQCR Take 5 mLs by mouth at bedtime as needed for cough. 12/28/13   Pamella Pert, MD  chlorpheniramine-HYDROcodone (TUSSIONEX PENNKINETIC ER) 10-8 MG/5ML LQCR Take 5 mLs by mouth every 12 (twelve) hours as needed for cough. 01/12/14   Billy Fischer, MD  fluticasone (FLONASE) 50 MCG/ACT nasal spray Place 2 sprays into both nostrils daily. 12/28/13   Pamella Pert, MD  ipratropium (ATROVENT) 0.06 % nasal spray Place 2 sprays into both nostrils 4 (four) times daily. 08/06/13   Gregor Hams, MD  ipratropium (ATROVENT) 0.06 % nasal spray Place 2 sprays into both nostrils 4 (four) times daily. 01/02/14   Harden Mo, MD  methylPREDNISolone (MEDROL  DOSEPAK) 4 MG tablet follow package directions take until finished, start on tues. 01/12/14   Billy Fischer, MD  metroNIDAZOLE (FLAGYL) 500 MG tablet Take 1 tablet (500 mg total) by mouth 2 (two) times daily. One po bid x 7 days 02/18/15   Carman Ching, PA-C  Multiple Vitamin (MULTIVITAMIN WITH MINERALS) TABS Take 1 tablet by mouth daily.    Historical Provider, MD  polyethylene glycol powder (GLYCOLAX/MIRALAX) powder Take 17 g by mouth 2 (two) times  daily as needed. For constipation.    Historical Provider, MD  polyethylene glycol powder (GLYCOLAX/MIRALAX) powder MIX 17 GRAMS WITH LIQUID TWICE DAILY AS NEEDED 02/24/13   Areta Haber Dunn, PA-C  predniSONE (DELTASONE) 10 MG tablet Take 3 tablets (30 mg total) by mouth daily. 08/06/13   Gregor Hams, MD  traMADol (ULTRAM) 50 MG tablet Take 1 tablet (50 mg total) by mouth every 6 (six) hours as needed. 08/06/13   Gregor Hams, MD  traMADol (ULTRAM) 50 MG tablet Take 2 tablets (100 mg total) by mouth every 8 (eight) hours as needed. 01/02/14   Harden Mo, MD   BP 141/93 mmHg  Pulse 92  Temp(Src) 98.3 F (36.8 C) (Oral)  Resp 18  Ht 5\' 3"  (1.6 m)  Wt 220 lb (99.791 kg)  BMI 38.98 kg/m2  SpO2 98%  LMP 02/08/2015 Physical Exam  Constitutional: She is oriented to person, place, and time. She appears well-developed and well-nourished. No distress.  HENT:  Head: Normocephalic and atraumatic.  Mouth/Throat: Oropharynx is clear and moist.  Eyes: Conjunctivae and EOM are normal.  Neck: Normal range of motion. Neck supple.  Cardiovascular: Normal rate, regular rhythm and normal heart sounds.   Pulmonary/Chest: Effort normal and breath sounds normal. No respiratory distress.  Abdominal: Soft. Bowel sounds are normal. There is no tenderness.  Genitourinary: Uterus normal. There is no rash on the right labia. There is no rash on the left labia. Cervix exhibits no motion tenderness, no discharge and no friability. Right adnexum displays no tenderness. Left adnexum displays no tenderness. No erythema or tenderness in the vagina. Vaginal discharge found.  Musculoskeletal: Normal range of motion. She exhibits no edema.  Neurological: She is alert and oriented to person, place, and time. No sensory deficit.  Skin: Skin is warm and dry.  Psychiatric: She has a normal mood and affect. Her behavior is normal.  Nursing note and vitals reviewed.   ED Course  Procedures (including critical care time) Labs  Review Labs Reviewed  WET PREP, GENITAL - Abnormal; Notable for the following:    Clue Cells Wet Prep HPF POC MODERATE (*)    WBC, Wet Prep HPF POC MODERATE (*)    All other components within normal limits  URINALYSIS, ROUTINE W REFLEX MICROSCOPIC (NOT AT [facility]) - Abnormal; Notable for the following:    APPearance CLOUDY (*)    All other components within normal limits  PREGNANCY, URINE  GC/CHLAMYDIA PROBE AMP ([place]) NOT AT [facility]    Imaging Review No results found.   EKG Interpretation None      MDM   Final diagnoses:  BV (bacterial vaginosis)   Nontoxic appearing, NAD. AF VSS. Abdomen is soft and nontender. Vaginal discharge noted. No CMT or adnexal tenderness. Doubt PID. Wet prep significant for clue cells. GC/quality cultures pending. Treat with Flagyl. Stable for discharge. Follow-up with PCP. Return precautions given. Patient states understanding of treatment care plan and is agreeable.  Carman Ching, PA-C 02/18/15 1735  Veryl Speak, MD 02/18/15 1758

## 2015-02-18 NOTE — ED Notes (Signed)
C/o vaginal d/c and itching since last Friday

## 2015-02-18 NOTE — Discharge Instructions (Signed)
Take flagyl twice daily for 1 week. No sexual intercourse for 10 days after completing course of antibiotic.  Bacterial Vaginosis Bacterial vaginosis is a vaginal infection that occurs when the normal balance of bacteria in the vagina is disrupted. It results from an overgrowth of certain bacteria. This is the most common vaginal infection in women of childbearing age. Treatment is important to prevent complications, especially in pregnant women, as it can cause a premature delivery. CAUSES  Bacterial vaginosis is caused by an increase in harmful bacteria that are normally present in smaller amounts in the vagina. Several different kinds of bacteria can cause bacterial vaginosis. However, the reason that the condition develops is not fully understood. RISK FACTORS Certain activities or behaviors can put you at an increased risk of developing bacterial vaginosis, including:  Having a new sex partner or multiple sex partners.  Douching.  Using an intrauterine device (IUD) for contraception. Women do not get bacterial vaginosis from toilet seats, bedding, swimming pools, or contact with objects around them. SIGNS AND SYMPTOMS  Some women with bacterial vaginosis have no signs or symptoms. Common symptoms include:  Grey vaginal discharge.  A fishlike odor with discharge, especially after sexual intercourse.  Itching or burning of the vagina and vulva.  Burning or pain with urination. DIAGNOSIS  Your health care provider will take a medical history and examine the vagina for signs of bacterial vaginosis. A sample of vaginal fluid may be taken. Your health care provider will look at this sample under a microscope to check for bacteria and abnormal cells. A vaginal pH test may also be done.  TREATMENT  Bacterial vaginosis may be treated with antibiotic medicines. These may be given in the form of a pill or a vaginal cream. A second round of antibiotics may be prescribed if the condition comes  back after treatment.  HOME CARE INSTRUCTIONS   Only take over-the-counter or prescription medicines as directed by your health care provider.  If antibiotic medicine was prescribed, take it as directed. Make sure you finish it even if you start to feel better.  Do not have sex until treatment is completed.  Tell all sexual partners that you have a vaginal infection. They should see their health care provider and be treated if they have problems, such as a mild rash or itching.  Practice safe sex by using condoms and only having one sex partner. SEEK MEDICAL CARE IF:   Your symptoms are not improving after 3 days of treatment.  You have increased discharge or pain.  You have a fever. MAKE SURE YOU:   Understand these instructions.  Will watch your condition.  Will get help right away if you are not doing well or get worse. FOR MORE INFORMATION  Centers for Disease Control and Prevention, Division of STD Prevention: AppraiserFraud.fi American Sexual Health Association (ASHA): www.ashastd.org  Document Released: 07/03/2005 Document Revised: 04/23/2013 Document Reviewed: 02/12/2013 [facility] Patient Information 2015 [place], [place]. This information is not intended to replace advice given to you by your health care provider. Make sure you discuss any questions you have with your health care provider.

## 2015-02-19 LAB — GC/CHLAMYDIA PROBE AMP (~~LOC~~) NOT AT ARMC
Chlamydia: NEGATIVE
Neisseria Gonorrhea: NEGATIVE

## 2015-09-29 ENCOUNTER — Emergency Department (HOSPITAL_COMMUNITY)
Admission: EM | Admit: 2015-09-29 | Discharge: 2015-09-29 | Disposition: A | Discharge status: Home / Self Care | Payer: BC Managed Care – PPO | Attending: Emergency Medicine | Admitting: Emergency Medicine

## 2015-09-29 ENCOUNTER — Emergency Department (HOSPITAL_COMMUNITY): Payer: BC Managed Care – PPO

## 2015-09-29 ENCOUNTER — Encounter (HOSPITAL_COMMUNITY): Payer: Self-pay | Admitting: *Deleted

## 2015-09-29 ENCOUNTER — Ambulatory Visit (INDEPENDENT_AMBULATORY_CARE_PROVIDER_SITE_OTHER): Payer: BC Managed Care – PPO | Admitting: Family Medicine

## 2015-09-29 VITALS — BP 138/80 | HR 89 | Temp 98.1°F | Resp 16 | Ht 62.0 in | Wt 224.0 lb

## 2015-09-29 DIAGNOSIS — R42 Dizziness and giddiness: Secondary | ICD-10-CM | POA: Diagnosis not present

## 2015-09-29 DIAGNOSIS — Z7982 Long term (current) use of aspirin: Secondary | ICD-10-CM | POA: Diagnosis not present

## 2015-09-29 DIAGNOSIS — M79602 Pain in left arm: Secondary | ICD-10-CM | POA: Diagnosis not present

## 2015-09-29 DIAGNOSIS — R079 Chest pain, unspecified: Secondary | ICD-10-CM | POA: Diagnosis not present

## 2015-09-29 DIAGNOSIS — R110 Nausea: Secondary | ICD-10-CM | POA: Insufficient documentation

## 2015-09-29 DIAGNOSIS — Z8719 Personal history of other diseases of the digestive system: Secondary | ICD-10-CM | POA: Diagnosis not present

## 2015-09-29 DIAGNOSIS — R9431 Abnormal electrocardiogram [ECG] [EKG]: Secondary | ICD-10-CM

## 2015-09-29 DIAGNOSIS — Z87891 Personal history of nicotine dependence: Secondary | ICD-10-CM | POA: Insufficient documentation

## 2015-09-29 DIAGNOSIS — E669 Obesity, unspecified: Secondary | ICD-10-CM | POA: Diagnosis not present

## 2015-09-29 DIAGNOSIS — M25512 Pain in left shoulder: Secondary | ICD-10-CM | POA: Diagnosis not present

## 2015-09-29 DIAGNOSIS — R072 Precordial pain: Secondary | ICD-10-CM | POA: Diagnosis not present

## 2015-09-29 DIAGNOSIS — Z862 Personal history of diseases of the blood and blood-forming organs and certain disorders involving the immune mechanism: Secondary | ICD-10-CM | POA: Insufficient documentation

## 2015-09-29 DIAGNOSIS — R142 Eructation: Secondary | ICD-10-CM

## 2015-09-29 DIAGNOSIS — R002 Palpitations: Secondary | ICD-10-CM | POA: Diagnosis not present

## 2015-09-29 LAB — POCT CBC
GRANULOCYTE PERCENT: 55 % (ref 37–80)
HCT, POC: 33.7 % — AB (ref 37.7–47.9)
Hemoglobin: 11.5 g/dL — AB (ref 12.2–16.2)
LYMPH, POC: 3.3 (ref 0.6–3.4)
MCH, POC: 27.2 pg (ref 27–31.2)
MCHC: 34 g/dL (ref 31.8–35.4)
MCV: 79.8 fL — AB (ref 80–97)
MID (cbc): 0.9 (ref 0–0.9)
MPV: 7.1 fL (ref 0–99.8)
POC Granulocyte: 5.1 (ref 2–6.9)
POC LYMPH PERCENT: 35.6 %L (ref 10–50)
POC MID %: 9.4 %M (ref 0–12)
Platelet Count, POC: 265 10*3/uL (ref 142–424)
RBC: 4.22 M/uL (ref 4.04–5.48)
RDW, POC: 15 %
WBC: 9.2 10*3/uL (ref 4.6–10.2)

## 2015-09-29 LAB — COMPREHENSIVE METABOLIC PANEL
ALBUMIN: 4.1 g/dL (ref 3.6–5.1)
ALT: 13 U/L (ref 6–29)
AST: 12 U/L (ref 10–30)
Alkaline Phosphatase: 61 U/L (ref 33–115)
BILIRUBIN TOTAL: 0.3 mg/dL (ref 0.2–1.2)
BUN: 14 mg/dL (ref 7–25)
CO2: 26 mmol/L (ref 20–31)
Calcium: 9.4 mg/dL (ref 8.6–10.2)
Chloride: 101 mmol/L (ref 98–110)
Creat: 0.78 mg/dL (ref 0.50–1.10)
Glucose, Bld: 81 mg/dL (ref 65–99)
Potassium: 4.1 mmol/L (ref 3.5–5.3)
SODIUM: 136 mmol/L (ref 135–146)
Total Protein: 7.7 g/dL (ref 6.1–8.1)

## 2015-09-29 LAB — CBC
HCT: 34.8 % — ABNORMAL LOW (ref 36.0–46.0)
Hemoglobin: 10.7 g/dL — ABNORMAL LOW (ref 12.0–15.0)
MCH: 25.4 pg — ABNORMAL LOW (ref 26.0–34.0)
MCHC: 30.7 g/dL (ref 30.0–36.0)
MCV: 82.5 fL (ref 78.0–100.0)
PLATELETS: 282 10*3/uL (ref 150–400)
RBC: 4.22 MIL/uL (ref 3.87–5.11)
RDW: 14.8 % (ref 11.5–15.5)
WBC: 9.1 10*3/uL (ref 4.0–10.5)

## 2015-09-29 LAB — LIPID PANEL
CHOL/HDL RATIO: 2.1 ratio (ref <=5.0)
Cholesterol: 111 mg/dL — ABNORMAL LOW (ref 125–200)
HDL: 53 mg/dL (ref >=46)
LDL CALC: 48 mg/dL (ref <130)
Triglycerides: 51 mg/dL (ref <150)
VLDL: 10 mg/dL (ref <30)

## 2015-09-29 LAB — BASIC METABOLIC PANEL
Anion gap: 12 (ref 5–15)
BUN: 11 mg/dL (ref 6–20)
CHLORIDE: 104 mmol/L (ref 101–111)
CO2: 25 mmol/L (ref 22–32)
CREATININE: 0.66 mg/dL (ref 0.44–1.00)
Calcium: 9 mg/dL (ref 8.9–10.3)
GFR calc Af Amer: >60 mL/min (ref >60)
GFR calc non Af Amer: >60 mL/min (ref >60)
GLUCOSE: 92 mg/dL (ref 65–99)
Potassium: 4.1 mmol/L (ref 3.5–5.1)
Sodium: 141 mmol/L (ref 135–145)

## 2015-09-29 LAB — POCT GLYCOSYLATED HEMOGLOBIN (HGB A1C): HEMOGLOBIN A1C: 6.1

## 2015-09-29 LAB — I-STAT TROPONIN, ED
Troponin i, poc: 0 ng/mL (ref 0.00–0.08)
Troponin i, poc: 0 ng/mL (ref 0.00–0.08)

## 2015-09-29 LAB — GLUCOSE, POCT (MANUAL RESULT ENTRY): POC Glucose: 108 mg/dl — AB (ref 70–99)

## 2015-09-29 IMAGING — CR DG CHEST 2V
2 series · 2 of 2 positions shown · non-contrast
Comparison: [DATE]

CLINICAL DATA: Chest discomfort for 2 weeks

EXAM:
CHEST  2 VIEW

[chest pa]
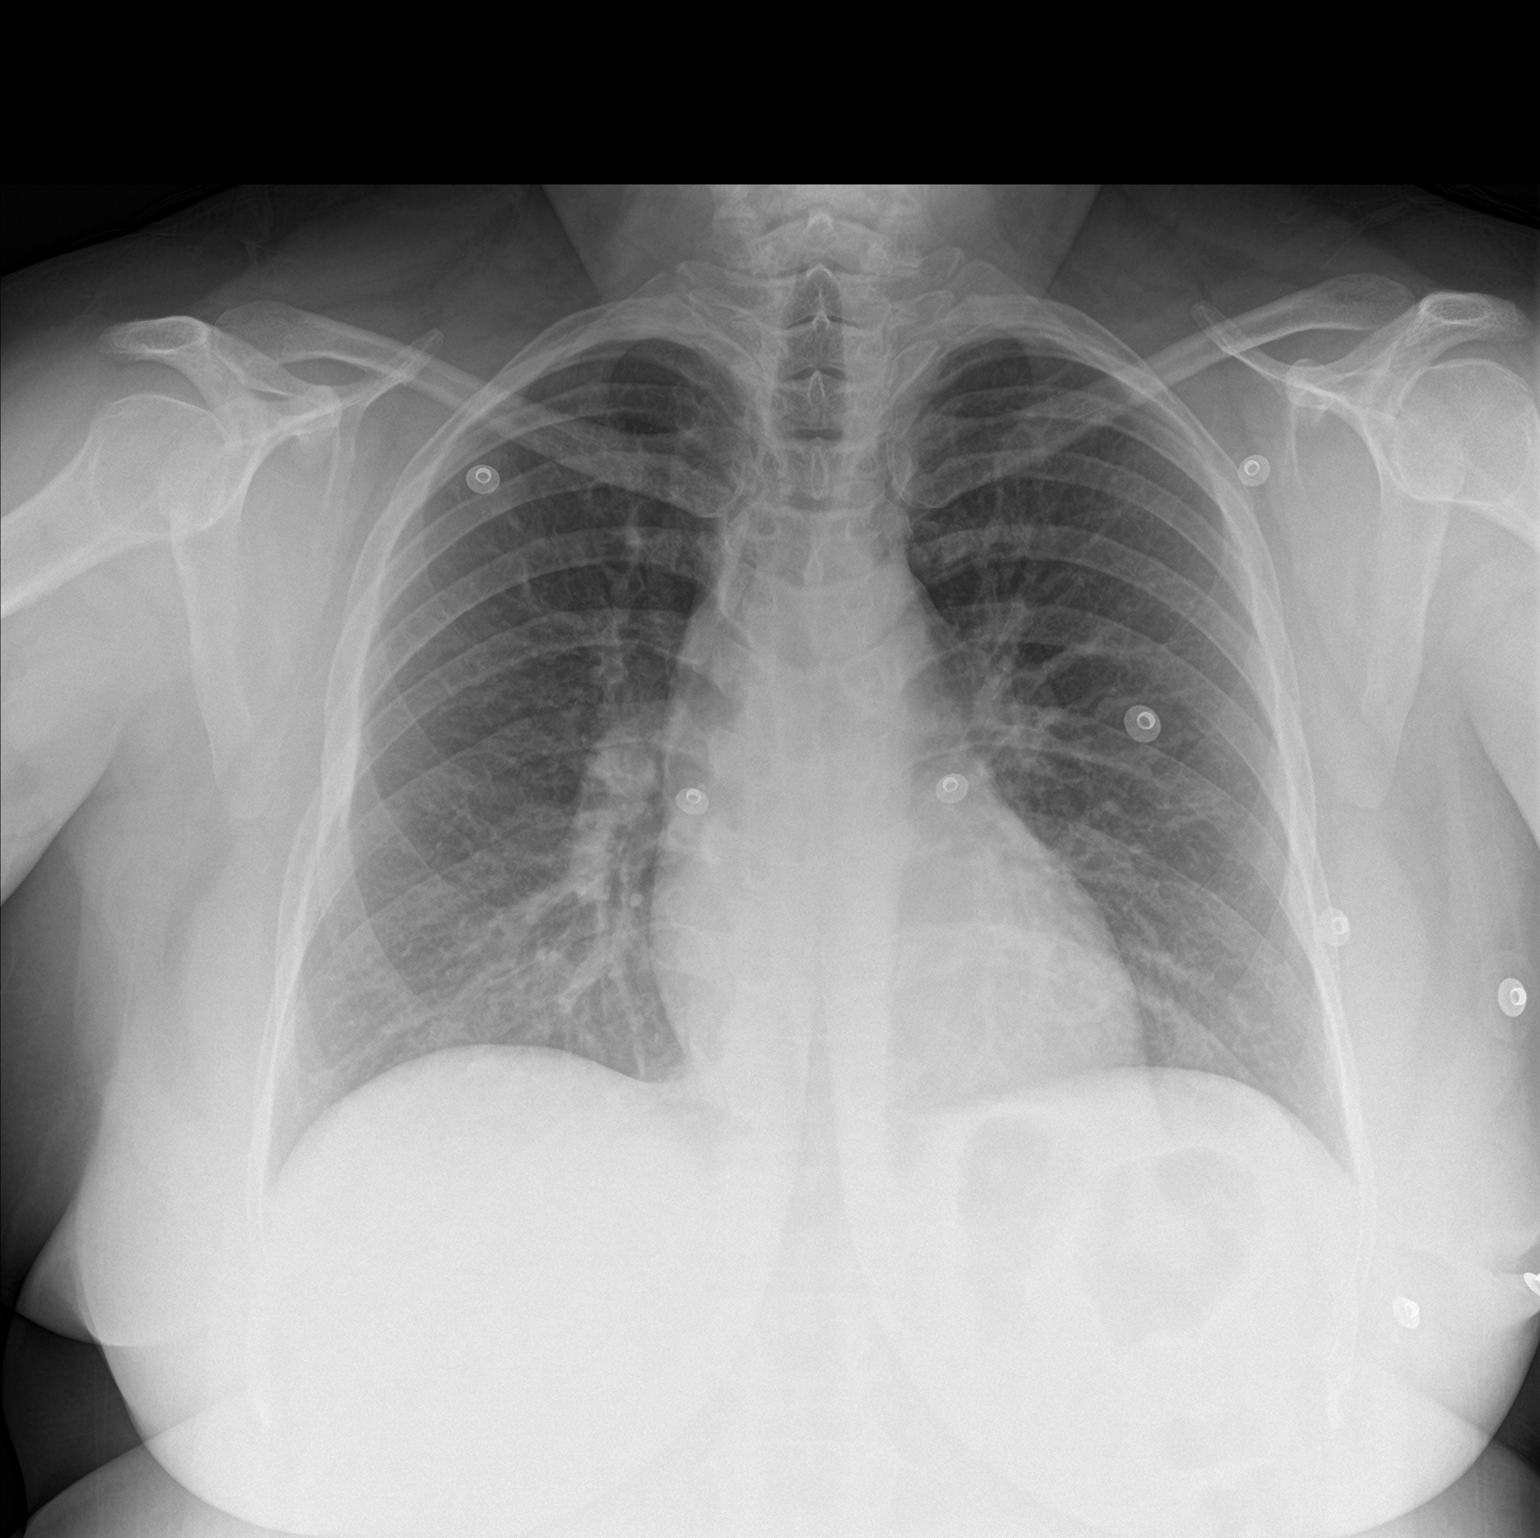

[chest lat]
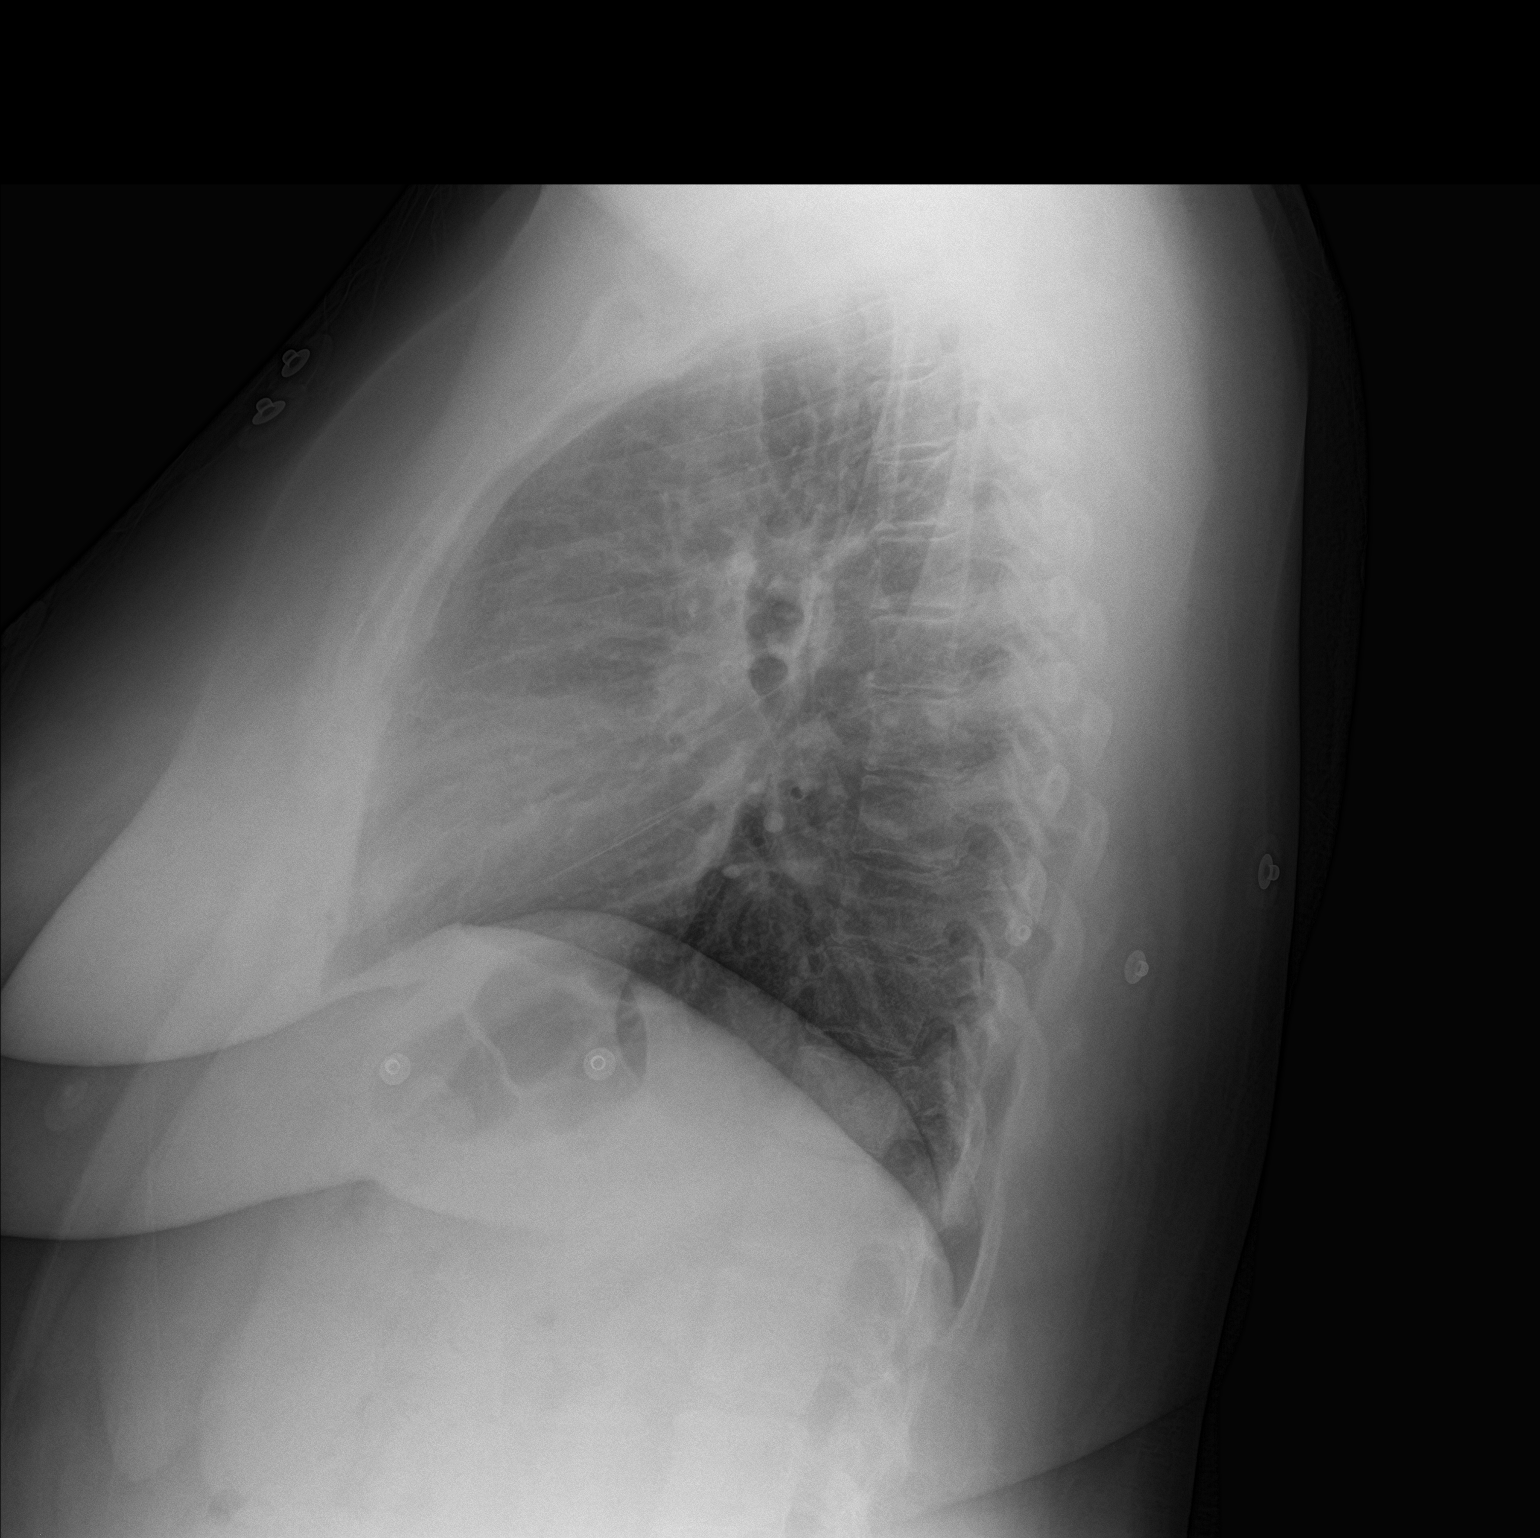

[2 of 2 positions shown; findings below may reference images not displayed]

FINDINGS: The heart size and mediastinal contours are within normal limits.
Both lungs are clear. The visualized skeletal structures are
unremarkable.
IMPRESSION: No active cardiopulmonary disease.

## 2015-09-29 MED ORDER — ASPIRIN 81 MG PO CHEW
324.0000 mg | CHEWABLE_TABLET | Freq: Once | ORAL | Status: AC
  Administered 2015-09-29: 324 mg via ORAL

## 2015-09-29 NOTE — ED Notes (Addendum)
Patient to Secretary desk complaining about ED wait time, stating that she called EMS so she would have a room in ED and not have to wait in the waiting room with her still having pain, this secretary expressed to patient that an EKG was performed, blood work was collected, and the nursing staff feel that she ok to wait in the waiting room at this time. Patient is very upset about wait time and having to wait in ED waiting room.

## 2015-09-29 NOTE — Progress Notes (Signed)
Patient ID: Sandra Bishop, female    DOB: 1970/08/22  Age: 45 y.o. MRN: [id number]  Chief Complaint  Patient presents with  . Chest Pain    x 4 days   . Arm Pain    left    Subjective:   45 year old lady who is here with history of having some symptoms over the last 4 days or so. She notices palpitations in her chest intermittently. She notices them when she is working and active. She has had a little lightheadedness. She has had some heavy chest pain sensation and today he had a little pain in her left upper arm. She has had some burping but no major nausea. No syncopal type episodes. She has been able to continue working. She quit smoking a number of years ago. She went to the emergency room some years ago with similar symptoms and they did not find anything. She does not have a regular physician. She is not on any regular medications.  Current allergies, medications, problem list, past/family and social histories reviewed.  Objective:  BP 138/80 mmHg  Pulse 89  Temp(Src) 98.1 F (36.7 C) (Oral)  Resp 16  Ht 5\' 2"  (1.575 m)  Wt 224 lb (101.606 kg)  BMI 40.96 kg/m2  SpO2 97%  LMP 09/08/2015 (Approximate)  Pleasant lady, looks comfortable and alert and oriented. Her throat is clear. Neck supple without nodes or thyromegaly. No carotid bruits. Chest is clear to auscultation. List and for over 90 seconds and did not hear any ectopy. Heart regular with grade 1-2 at the left upper sternal border. Abdomen soft without masses or tenderness. No ankle edema.  Assessment & Plan:   Assessment: 1. Precordial pain   2. Left arm pain   3. Palpitations   4. Lightheadedness   5. Burping   6. Nonspecific abnormal electrocardiogram (ECG) (EKG)       Plan: We'll check some basic labs and proceed from there.  Orders Placed This Encounter  Procedures  . Lipid panel  . Comprehensive metabolic panel  . TSH  . POCT CBC  . POCT glucose (manual entry)  . POCT glycosylated hemoglobin (Hb  A1C)  . EKG 12-Lead    Meds ordered this encounter  Medications  . aspirin chewable tablet 324 mg    Sig:    Results for orders placed or performed in visit on 09/29/15  POCT CBC  Result Value Ref Range   WBC 9.2 4.6 - 10.2 K/uL   Lymph, poc 3.3 0.6 - 3.4   POC LYMPH PERCENT 35.6 10 - 50 %L   MID (cbc) 0.9 0 - 0.9   POC MID % 9.4 0 - 12 %M   POC Granulocyte 5.1 2 - 6.9   Granulocyte percent 55.0 37 - 80 %G   RBC 4.22 4.04 - 5.48 M/uL   Hemoglobin 11.5 (A) 12.2 - 16.2 g/dL   HCT, POC 33.7 (A) 37.7 - 47.9 %   MCV 79.8 (A) 80 - 97 fL   MCH, POC 27.2 27 - 31.2 pg   MCHC 34.0 31.8 - 35.4 g/dL   RDW, POC 15.0 %   Platelet Count, POC 265 142 - 424 K/uL   MPV 7.1 0 - 99.8 fL  POCT glucose (manual entry)  Result Value Ref Range   POC Glucose 108 (A) 70 - 99 mg/dl  POCT glycosylated hemoglobin (Hb A1C)  Result Value Ref Range   Hemoglobin A1C 6.1     Patient has been having palpitations and dizziness.  There is mild EKG abnormality with minimal ST elevation in V1 and V2. Discussed with the patient the need for transfer her to the hospital for further evaluation. She was reluctant but she and her son finally agreed to this. EMS is transporting her to the hospital.     Patient Instructions  You will go to the emergency room at [facility].  If you need further follow-up they may send you to a cardiologist, or you can return here if further concerns.  IF you received an x-ray today, you will receive an invoice from [facility]. Please contact [facility] at [phone] with questions or concerns regarding your invoice.   IF you received labwork today, you will receive an invoice from [employer]. Please contact [facility] at [phone] with questions or concerns regarding your invoice.   Our billing staff will not be able to assist you with questions regarding bills from these companies.  You will be contacted with the lab  results as soon as they are available. The fastest way to get your results is to activate your My Chart account. Instructions are located on the last page of this paperwork. If you have not heard from [place] regarding the results in 2 weeks, please contact this office.    Results for orders placed or performed in visit on 09/29/15  POCT CBC  Result Value Ref Range   WBC 9.2 4.6 - 10.2 K/uL   Lymph, poc 3.3 0.6 - 3.4   POC LYMPH PERCENT 35.6 10 - 50 %L   MID (cbc) 0.9 0 - 0.9   POC MID % 9.4 0 - 12 %M   POC Granulocyte 5.1 2 - 6.9   Granulocyte percent 55.0 37 - 80 %G   RBC 4.22 4.04 - 5.48 M/uL   Hemoglobin 11.5 (A) 12.2 - 16.2 g/dL   HCT, POC 33.7 (A) 37.7 - 47.9 %   MCV 79.8 (A) 80 - 97 fL   MCH, POC 27.2 27 - 31.2 pg   MCHC 34.0 31.8 - 35.4 g/dL   RDW, POC 15.0 %   Platelet Count, POC 265 142 - 424 K/uL   MPV 7.1 0 - 99.8 fL  POCT glucose (manual entry)  Result Value Ref Range   POC Glucose 108 (A) 70 - 99 mg/dl  POCT glycosylated hemoglobin (Hb A1C)  Result Value Ref Range   Hemoglobin A1C 6.1      No Follow-up on file.   Davita Sublett, MD 09/29/2015

## 2015-09-29 NOTE — ED Notes (Signed)
Pt arrived by [facility]. Pt having palpitations x 2 weeks. Onset Saturday of mid chest pressure, today pain began radiating into left arm. Pt went to [facility] and sent here due to abnormal ekg. No acute distress noted at triage, ekg done. Iv started pta, pt given ASA and zofran 4mg  pta.

## 2015-09-29 NOTE — ED Provider Notes (Signed)
CSN: [id number]     Arrival date & time 09/29/15  1742 History   First MD Initiated Contact with Patient 09/29/15 2104     Chief Complaint  Patient presents with  . Palpitations  . Chest Pain     (Consider location/radiation/quality/duration/timing/severity/associated sxs/prior Treatment) HPI Comments: 2 weeks of palpitations, feeling light heart skips a beat Saturday pain came while shopping Today at work, felt lightheaded, nauseas, with shoulder pain today  No chest pain Heart skipping a beat Left shoulder/arm pain, back, shoulder "feels heavy" Comes and goes randomly, not exertional Lasts 30-23min then gets better 2-3 times a day has episodes Took aspirin today  Not worse with arm movement No numbness/weakness No shortness of breath Seen at urgent care and sent here for cardiac eval    Patient is a 45 y.o. female presenting with palpitations and chest pain.  Palpitations Associated symptoms: back pain and nausea   Associated symptoms: no chest pain (shoulder pain), no cough, no diaphoresis, no shortness of breath and no vomiting   Chest Pain Associated symptoms: back pain, nausea and palpitations   Associated symptoms: no abdominal pain, no cough, no diaphoresis, no fever, no headache, no shortness of breath and not vomiting   No immediate family CAD Stopped smoking 43yrs ago No hx of PE/DVT, no surgeries/no estrogen  Past Medical History  Diagnosis Date  . Anemia   . Constipation   . Obesity   . Allergy    Past Surgical History  Procedure Laterality Date  . Tubal ligation    . Eye surgery     Family History  Problem Relation Age of Onset  . Hypertension Mother    Social History  Substance Use Topics  . Smoking status: Former Smoker    Types: Cigarettes    Quit date: 12/28/2010  . Smokeless tobacco: Never Used  . Alcohol Use: No   OB History    No data available     Review of Systems  Constitutional: Negative for fever and diaphoresis.  HENT:  Negative for sore throat.   Eyes: Negative for visual disturbance.  Respiratory: Negative for cough and shortness of breath.   Cardiovascular: Positive for palpitations. Negative for chest pain (shoulder pain).  Gastrointestinal: Positive for nausea. Negative for vomiting, abdominal pain and diarrhea.  Genitourinary: Negative for difficulty urinating.  Musculoskeletal: Positive for back pain and arthralgias (shoulder pain). Negative for neck pain.  Skin: Negative for rash.  Neurological: Positive for light-headedness. Negative for syncope and headaches.      Allergies  Review of patient's allergies indicates no known allergies.  Home Medications   Prior to Admission medications   Medication Sig Start Date End Date Taking? Authorizing Provider  aspirin EC 81 MG tablet Take 81 mg by mouth daily.   Yes Historical Provider, MD  Chlorphen-Pseudoephed-APAP (THERAFLU FLU/COLD PO) Take 1 packet by mouth daily as needed (congestion).   Yes Historical Provider, MD   BP 125/72 mmHg  Pulse 93  Temp(Src) 98.3 F (36.8 C) (Oral)  Resp 28  SpO2 98%  LMP 09/08/2015 (Approximate) Physical Exam  Constitutional: She is oriented to person, place, and time. She appears well-developed and well-nourished. No distress.  HENT:  Head: Normocephalic and atraumatic.  Eyes: Conjunctivae and EOM are normal.  Neck: Normal range of motion.  Cardiovascular: Normal rate, regular rhythm, normal heart sounds and intact distal pulses.  Exam reveals no gallop and no friction rub.   No murmur heard. Pulmonary/Chest: Effort normal and breath sounds normal. No respiratory  distress. She has no wheezes. She has no rales.  Abdominal: Soft. She exhibits no distension. There is no tenderness. There is no guarding.  Musculoskeletal: She exhibits no edema or tenderness.  Neurological: She is alert and oriented to person, place, and time.  Skin: Skin is warm and dry. No rash noted. She is not diaphoretic. No erythema.   Nursing note and vitals reviewed.   ED Course  Procedures (including critical care time) Labs Review Labs Reviewed  CBC - Abnormal; Notable for the following:    Hemoglobin 10.7 (*)    HCT 34.8 (*)    MCH 25.4 (*)    All other components within normal limits  BASIC METABOLIC PANEL  I-STAT TROPOININ, ED  Randolm Idol, ED    Imaging Review Dg Chest 2 View  09/29/2015  CLINICAL DATA:  Chest discomfort for 2 weeks EXAM: CHEST  2 VIEW COMPARISON:  01/12/2014 FINDINGS: The heart size and mediastinal contours are within normal limits. Both lungs are clear. The visualized skeletal structures are unremarkable. IMPRESSION: No active cardiopulmonary disease. Electronically Signed   By: Inez Catalina M.D.   On: 09/29/2015 18:33   I have personally reviewed and evaluated these images and lab results as part of my medical decision-making.   EKG Interpretation   Date/Time:  Wednesday September 29 2015 17:49:10 EDT Ventricular Rate:  88 PR Interval:  140 QRS Duration: 82 QT Interval:  358 QTC Calculation: 433 R Axis:   26 Text Interpretation:  Normal sinus rhythm Nonspecific ST and T wave  abnormality Abnormal ECG Nonspecific changes in aVF, V6 new from prior  Confirmed by [facility] MD, [place] ([id number]) on 09/29/2015 8:58:07 PM      MDM   Final diagnoses:  Palpitations  Left shoulder pain   45 year old female with history of obesity presents with concern of left shoulder pain and palpitations. Differential diagnosis for L shoulder pain includes pulmonary embolus, dissection, pneumothorax, pneumonia, ACS, myocarditis, pericarditis, musculoskeletal, cervical radiculopathy.  EKG was done and evaluate by me and showed no acute ST changes and no signs of pericarditis. Chest x-ray was done and evaluated by me and radiology and showed no sign of pneumonia or pneumothorax. Patient is PERC negative and low risk [place] and have low suspicion for PE. Normal pulses bilaterally UE and LE, no sign of  dissection on XR, and have low suspicion for this given intermittent shoulder pain over 3 days.  Patient is low risk HEART score and had delta troponins which were both negative. Given this evaluation, history and physical have low suspicion for pulmonary embolus, pneumonia, ACS, myocarditis, pericarditis, dissection.  Patient without pain with movement and doubt shoulder injury or septic arthritis. She is no midline neck pain, and history is not consistent with a cervical radiculopathy. She has no abdominal pain and doubt this is referred from the abdomen.  Discussed thought process with patient, low likelihood of acute pathology, and recommend close PCP follow up. Given palpitations as well as shoulder pain, recommend Cardiology follow up.  Gareth Morgan, MD 09/30/15 [phone]

## 2015-09-29 NOTE — ED Notes (Addendum)
Pt to Secretary desk again, Complaining about wait time, This Secretary expressed to patient that she had been moved to room and that someone would be coming to take her back shortly to a room to see the dr, patient then began stating that "now I'm gonna have to wait back there"

## 2015-09-29 NOTE — ED Notes (Signed)
Pt denies chest pain or palpitations at this time.

## 2015-09-29 NOTE — Patient Instructions (Addendum)
You will go to the emergency room at [facility].  If you need further follow-up they may send you to a cardiologist, or you can return here if further concerns.  IF you received an x-ray today, you will receive an invoice from [facility] Radiology. Please contact [facility]iology at [phone] with questions or concerns regarding your invoice.   IF you received labwork today, you will receive an invoice from [employer]. Please contact [facility] at [phone] with questions or concerns regarding your invoice.   Our billing staff will not be able to assist you with questions regarding bills from these companies.  You will be contacted with the lab results as soon as they are available. The fastest way to get your results is to activate your My Chart account. Instructions are located on the last page of this paperwork. If you have not heard from [place] regarding the results in 2 weeks, please contact this office.

## 2015-09-30 LAB — TSH: TSH: 0.56 mIU/L

## 2015-10-01 ENCOUNTER — Telehealth: Payer: Self-pay

## 2015-10-01 NOTE — Telephone Encounter (Signed)
error 

## 2015-10-01 NOTE — Telephone Encounter (Signed)
Please review

## 2015-10-01 NOTE — Telephone Encounter (Signed)
Pt was seen on 3/15 by Dr. Linna Darner for Precordial pain - Primary. She was sent out to the E. They weren't able to tell her what is going on with  her. She has an appointment on 3/22 with Almyra Deforest, [place] a cardiologist. She would like to know is there anything she can do.She still has pain in her left arm and her heart rate doesn't feel normal. Please advise at [phone]

## 2015-10-04 NOTE — Telephone Encounter (Signed)
Spoke with pt. 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 She still is having palpitations. She has follow up with cardiologist on 3/22. I advised if she develops worsening symptoms (CP, sob, snycope) go to ED. Otherwise, f/u with cardiologist on 3/22. She voiced understanding.

## 2015-10-06 ENCOUNTER — Encounter: Payer: Self-pay | Admitting: Physician Assistant

## 2015-10-06 ENCOUNTER — Ambulatory Visit (INDEPENDENT_AMBULATORY_CARE_PROVIDER_SITE_OTHER): Payer: BC Managed Care – PPO | Admitting: Physician Assistant

## 2015-10-06 ENCOUNTER — Ambulatory Visit (INDEPENDENT_AMBULATORY_CARE_PROVIDER_SITE_OTHER): Payer: BC Managed Care – PPO

## 2015-10-06 VITALS — BP 150/88 | HR 80 | Ht 61.0 in | Wt 227.6 lb

## 2015-10-06 DIAGNOSIS — I493 Ventricular premature depolarization: Secondary | ICD-10-CM | POA: Diagnosis not present

## 2015-10-06 DIAGNOSIS — M79602 Pain in left arm: Secondary | ICD-10-CM | POA: Diagnosis not present

## 2015-10-06 LAB — EXERCISE TOLERANCE TEST
CHL RATE OF PERCEIVED EXERTION: 16
CSEPEW: 7 METS
CSEPHR: 92 %
Exercise duration (min): 5 min
Exercise duration (sec): 0 s
MPHR: 176 {beats}/min
Peak HR: 162 {beats}/min
Rest HR: 97 {beats}/min

## 2015-10-06 NOTE — Progress Notes (Signed)
Cardiology Office Note   Date:  10/06/2015   ID:  Sandra Bishop, DOB 02/24/1971, MRN [id number]  PCP:  Ellsworth Lennox, MD  Cardiologist:  New - seen with DOD Dr. Acie Fredrickson  Chief Complaint  Patient presents with  . New Patient (Initial Visit)    seen with Dr. Acie Fredrickson DOD, for intermittent L arm pain for 2 weeks, and irregular heart beat for 1 month  . Arm Pain  . Irregular Heart Beat      History of Present Illness: Sandra Bishop is a 45 y.o. female who presents for new patient visitation. She has no past cardiac history. She has no noticeable coronary calcification on the previous CT in 2009. Her maternal grandmother does have history of heart failure, however both of her parents does not have history of coronary artery disease. 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 In the past month, she has been noticing increasing episode of irregular heartbeat that only last a second at a time. Ever it is fairly regular and occur almost on a daily basis. Last Wednesday, it occurred throughout the day which was very concerning to her. She also has some dizziness. She denies any chest pain, however for the past 2 weeks, she has been having some left arm pain. She first noticed it when she was out walking, however it does not seems to be correlated with physical exertion as she also notices at rest. The palpitation and left arm pain do not usually occur together. Eventually she decided to seek medical attention at local urgent care since her PCP is currently retiring. There was some abnormality on the EKG with questionable ST segment in V1 and V2, she was sent to the ED for further workup. Workup was negative in the ED. Her symptom was felt to be atypical and she was discharged to have close outpatient cardiology consultation.  She presents today for initial new patient visit. She states, she had irregular heartbeat throughout the entire day on  Thursday. As for her left arm pain, it has not been bothering her very much. Again she denies any chest pain. She denies any significant shortness of breath, lower extremity edema, orthopnea or paroxysmal nocturnal dyspnea. She has not experienced any palpitation today.    Past Medical History  Diagnosis Date  . Anemia   . Constipation   . Obesity   . Allergy     Past Surgical History  Procedure Laterality Date  . Tubal ligation    . Eye surgery       Current Outpatient Prescriptions  Medication Sig Dispense Refill  . aspirin EC 81 MG tablet Take 81 mg by mouth daily.     No current facility-administered medications for this visit.    Allergies:   Review of patient's allergies indicates no known allergies.    Social History:  The patient  reports that she quit smoking about 4 years ago. Her smoking use included Cigarettes. She has never used smokeless tobacco. She reports that she does not drink alcohol or use illicit drugs.   Family History:  The patient's family history includes Heart failure in her maternal grandmother; Hypertension in her mother; Pancreatic cancer (age of onset: 82) in her father.    ROS:  Please see the history of present illness.   Otherwise, review of systems are positive for L arm pain and irregular heart beat.   All other systems are reviewed and negative.  PHYSICAL EXAM: VS:  BP 150/88 mmHg  Pulse 80  Ht 5\' 1"  (1.549 m)  Wt 227 lb 9.6 oz (103.239 kg)  BMI 43.03 kg/m2  LMP 09/08/2015 (Approximate) , BMI Body mass index is 43.03 kg/(m^2). GEN: Well nourished, well developed, in no acute distress HEENT: normal Neck: no JVD, carotid bruits, or masses Cardiac: RRR; no murmurs, rubs, or gallops,no edema  Respiratory:  clear to auscultation bilaterally, normal work of breathing GI: soft, nontender, nondistended, + BS MS: no deformity or atrophy Skin: warm and dry, no rash Neuro:  Strength and sensation are intact Psych: euthymic mood, full  affect   EKG:  EKG is ordered today. The ekg ordered today demonstrates NSR without significant ST-T wave changes   Recent Labs: 09/29/2015: ALT 13; BUN 11; Creatinine, Ser 0.66; Hemoglobin 10.7*; Platelets 282; Potassium 4.1; Sodium 141; TSH 0.56    Lipid Panel    Component Value Date/Time   CHOL 111* 09/29/2015 1621   TRIG 51 09/29/2015 1621   HDL 53 09/29/2015 1621   CHOLHDL 2.1 09/29/2015 1621   VLDL 10 09/29/2015 1621   LDLCALC 48 09/29/2015 1621      Wt Readings from Last 3 Encounters:  10/06/15 227 lb 9.6 oz (103.239 kg)  09/29/15 224 lb (101.606 kg)  02/18/15 220 lb (99.791 kg)      Other studies Reviewed: Additional studies/ records that were reviewed today include:   Recent EKG  Review of the above records demonstrates:   Patient without cardiac history presented with irregular heart beat and L arm pain. Recent negative workup in ED   ASSESSMENT AND PLAN:  1.  Irregular heartbeat: Per her description, likely represent either PVCs or PACs. Patient is interested to definitively figure out what is causing the issue, we'll arrange 48-hour Holter monitor.  2. Left arm pain, no significant change on the EKG today. Low risk for CAD other than obesity. Will arrange plain old treadmill test, if both Holter monitor and plain old treadmill test do not have significantly resolved, patient does not need regular cardiology follow-up and should only follow-up with [place] on an as-needed basis.  3. Elevated blood pressure: no prior h/o HTN, her BP is high today 150/88, will not treat this solitary reading, further monitoring needed with PCP  Current medicines are reviewed at length with the patient today.  The patient does not have concerns regarding medicines.  The following changes have been made:  no change  Labs/ tests ordered today include:   Orders Placed This Encounter  Procedures  . Holter monitor - 48 hour  . Exercise Tolerance Test     Disposition:   FU  with cardiology as needed if both 48 hours holter monitor and POET are normal.   Signed, Almyra Deforest, [place]  10/06/2015 9:20 AM    [facility] [place], [place], [place]  [address] Phone: [phone]; Fax: [phone]   Attending Note:   The patient was seen and examined.  Agree with assessment and plan as noted above.  Changes made to the above note as needed.  Pt has atypical CP . Agree with plans for plain treadmill Has palpitations that are likely due to PVC. Will get a 48 hr holter.    Thayer Headings, Brooke Bonito., MD, [facility] 10/06/2015, 5:23 PM [address],  [place] Pager [phone]

## 2015-10-06 NOTE — Patient Instructions (Signed)
Your physician recommends that you schedule a follow-up appointment in: As Needed  Your physician has recommended that you wear a 48 hour holter monitor. Holter monitors are medical devices that record the heart's electrical activity. Doctors most often use these monitors to diagnose arrhythmias. Arrhythmias are problems with the speed or rhythm of the heartbeat. The monitor is a small, portable device. You can wear one while you do your normal daily activities. This is usually used to diagnose what is causing palpitations/syncope (passing out).  Your physician has requested that you have an exercise tolerance test. For further information please visit [email or web address]. Please also follow instruction sheet, as given.

## 2016-02-28 ENCOUNTER — Other Ambulatory Visit: Payer: Self-pay | Admitting: Family Medicine

## 2016-02-28 DIAGNOSIS — Z1231 Encounter for screening mammogram for malignant neoplasm of breast: Secondary | ICD-10-CM

## 2016-03-03 ENCOUNTER — Ambulatory Visit: Payer: BC Managed Care – PPO

## 2017-03-17 ENCOUNTER — Emergency Department (INDEPENDENT_AMBULATORY_CARE_PROVIDER_SITE_OTHER)
Admission: EM | Admit: 2017-03-17 | Discharge: 2017-03-17 | Disposition: A | Discharge status: Home / Self Care | Payer: BC Managed Care – PPO | Source: Home / Self Care | Attending: Family Medicine | Admitting: Family Medicine

## 2017-03-17 ENCOUNTER — Encounter: Payer: Self-pay | Admitting: Emergency Medicine

## 2017-03-17 DIAGNOSIS — B9689 Other specified bacterial agents as the cause of diseases classified elsewhere: Secondary | ICD-10-CM

## 2017-03-17 DIAGNOSIS — N760 Acute vaginitis: Secondary | ICD-10-CM | POA: Diagnosis not present

## 2017-03-17 MED ORDER — METRONIDAZOLE 500 MG PO TABS: ORAL_TABLET | ORAL | 0 refills | Status: DC

## 2017-03-17 NOTE — ED Provider Notes (Signed)
[facility]    CSN: [id number] Arrival date & time: 03/17/17  1418     History   Chief Complaint Chief Complaint  Patient presents with  . Vaginal Discharge    HPI Sandra Bishop is a 46 y.o. female.   Patient reports that she had unprotected intercourse about one month ago, and since then has had persistent malodorous vaginal discharge without abdominal or pelvic pain.  No rash.  No nausea/vomiting.  No fevers, chills, and sweats.  No urinary symptoms.  She has history of tubal ligation.  She has had no improvement after taking Monistat.   The history is provided by the patient.  Vaginal Discharge  Quality:  Malodorous and watery Severity:  Mild Onset quality:  Gradual Duration:  1 month Timing:  Constant Progression:  Unchanged Chronicity:  Recurrent Context: after intercourse   Relieved by:  Nothing Worsened by:  Nothing Ineffective treatments:  OTC medications Associated symptoms: vaginal itching   Associated symptoms: no abdominal pain, no dysuria, no fever, no genital lesions, no nausea, no rash, no urinary frequency, no urinary hesitancy, no urinary incontinence and no vomiting   Risk factors: STI exposure and unprotected sex     Past Medical History:  Diagnosis Date  . Allergy   . Anemia   . Constipation   . Obesity     Patient Active Problem List   Diagnosis Date Noted  . Anemia   . Obesity   . Pancreatitis 09/02/2011  . Constipation 08/30/2011    Past Surgical History:  Procedure Laterality Date  . EYE SURGERY    . TUBAL LIGATION      OB History    No data available       Home Medications    Prior to Admission medications   Medication Sig Start Date End Date Taking? Authorizing Provider  aspirin EC 81 MG tablet Take 81 mg by mouth daily.    [provider]  metroNIDAZOLE (FLAGYL) 500 MG tablet Take one tab by mouth every 12 hours for 7 days. 03/17/17   Kandra Nicolas, MD    Family History Family History    Problem Relation Age of Onset  . Hypertension Mother   . Pancreatic cancer Father 72  . Heart failure Maternal Grandmother     Social History Social History  Substance Use Topics  . Smoking status: Former Smoker    Types: Cigarettes    Quit date: 12/28/2010  . Smokeless tobacco: Never Used     Comment: quit in 2012, previously smoked 15 years  . Alcohol use No     Allergies   Patient has no known allergies.   Review of Systems Review of Systems  Constitutional: Negative for fever.  Gastrointestinal: Negative for abdominal pain, nausea and vomiting.  Genitourinary: Positive for vaginal discharge. Negative for bladder incontinence, dysuria and hesitancy.  All other systems reviewed and are negative.    Physical Exam Triage Vital Signs ED Triage Vitals  Enc Vitals Group     BP 03/17/17 1532 130/83     Pulse Rate 03/17/17 1532 92     Resp --      Temp 03/17/17 1532 98.4 F (36.9 C)     Temp src --      SpO2 03/17/17 1532 97 %     Weight 03/17/17 1532 230 lb (104.3 kg)     Height --      Head Circumference --      Peak Flow --  Pain Score 03/17/17 1533 0     Pain Loc --      Pain Edu? --      Excl. in [place]? --    No data found.   Updated Vital Signs BP 130/83 (BP Location: Right Arm)   Pulse 92   Temp 98.4 F (36.9 C)   Wt 230 lb (104.3 kg)   SpO2 97%   BMI 43.46 kg/m   Visual Acuity Right Eye Distance:   Left Eye Distance:   Bilateral Distance:    Right Eye Near:   Left Eye Near:    Bilateral Near:     Physical Exam Nursing notes and Vital Signs reviewed. Appearance:  Patient appears stated age, and in no acute distress.    Eyes:  Pupils are equal, round, and reactive to light and accomodation.  Extraocular movement is intact.  Conjunctivae are not inflamed   Pharynx:  Normal; moist mucous membranes  Neck:  Supple.  No adenopathy Lungs:  Clear to auscultation.  Breath sounds are equal.  Moving air well. Heart:  Regular rate and rhythm  without murmurs, rubs, or gallops.  Abdomen:  Nontender without masses or hepatosplenomegaly.  Bowel sounds are present.  No CVA or flank tenderness.  Extremities:  No edema.  Skin:  No rash present.    Pelvic exam deferred  UC Treatments / Results  Labs (all labs ordered are listed, but only abnormal results are displayed) Labs Reviewed  GC/CHLAMYDIA PROBE AMP  POCT WET + KOH PREP:  Negative trich; negative WBC; epithelial cells present; Positive clue cells; negative yeast.    EKG  EKG Interpretation None       Radiology No results found.  Procedures Procedures (including critical care time)  Medications Ordered in UC Medications - No data to display   Initial Impression / Assessment and Plan / UC Course  I have reviewed the triage vital signs and the nursing notes.  Pertinent labs & imaging results that were available during my care of the patient were reviewed by me and considered in my medical decision making (see chart for details).    Begin Flagyl. GC and chlamydia pending. Followup with Family Doctor if not improved in 6 days.    Final Clinical Impressions(s) / UC Diagnoses   Final diagnoses:  BV (bacterial vaginosis)    New Prescriptions New Prescriptions   METRONIDAZOLE (FLAGYL) 500 MG TABLET    Take one tab by mouth every 12 hours for 7 days.         Kandra Nicolas, MD 03/21/17 1414

## 2017-03-17 NOTE — ED Triage Notes (Signed)
Pt states she had unprotected intercourse about 1 month ago. Since then she has had vaginal d/c, odor and irritation. States she tried otc treatments for yeast with no relief. She has hx of STD from this partner about 3 years ago.

## 2017-03-17 NOTE — ED Triage Notes (Signed)
Password is [phone]

## 2017-03-21 LAB — GC/CHLAMYDIA PROBE AMP
CT PROBE, AMP APTIMA: NOT DETECTED
GC PROBE AMP APTIMA: NOT DETECTED

## 2017-03-22 ENCOUNTER — Telehealth: Payer: Self-pay | Admitting: Family Medicine

## 2017-03-22 ENCOUNTER — Telehealth: Payer: Self-pay

## 2017-03-22 MED ORDER — CLINDAMYCIN HCL 300 MG PO CAPS: ORAL_CAPSULE | ORAL | 0 refills | Status: DC

## 2017-03-22 NOTE — Telephone Encounter (Signed)
Password received "[phone]" patient has taken medication as prescribed up until now. Yesterday the flagyl made her nauseated. She reports her discharge has not improved. She does not currently have a GYN as she just moved. I encouraged her to establish with one. Do we have any further options for her now?

## 2017-03-22 NOTE — Telephone Encounter (Signed)
May switch to Clindamycin 300mg  BID for one week.  Followup with GYN if not resolved after one week.

## 2017-07-08 ENCOUNTER — Other Ambulatory Visit: Payer: Self-pay

## 2017-07-08 ENCOUNTER — Encounter (HOSPITAL_BASED_OUTPATIENT_CLINIC_OR_DEPARTMENT_OTHER): Payer: Self-pay

## 2017-07-08 ENCOUNTER — Emergency Department (HOSPITAL_BASED_OUTPATIENT_CLINIC_OR_DEPARTMENT_OTHER)
Admission: EM | Admit: 2017-07-08 | Discharge: 2017-07-08 | Disposition: A | Discharge status: Home / Self Care | Payer: BC Managed Care – PPO | Attending: Emergency Medicine | Admitting: Emergency Medicine

## 2017-07-08 DIAGNOSIS — Z87891 Personal history of nicotine dependence: Secondary | ICD-10-CM | POA: Insufficient documentation

## 2017-07-08 DIAGNOSIS — L02412 Cutaneous abscess of left axilla: Secondary | ICD-10-CM | POA: Insufficient documentation

## 2017-07-08 MED ORDER — SULFAMETHOXAZOLE-TRIMETHOPRIM 800-160 MG PO TABS: 1.0000 | ORAL_TABLET | Freq: Two times a day (BID) | ORAL | 0 refills | Status: AC

## 2017-07-08 MED ORDER — IBUPROFEN 800 MG PO TABS
800.0000 mg | ORAL_TABLET | Freq: Once | ORAL | Status: AC
  Administered 2017-07-08: 800 mg via ORAL
  Filled 2017-07-08: qty 1

## 2017-07-08 MED ORDER — LIDOCAINE HCL (PF) 1 % IJ SOLN
5.0000 mL | Freq: Once | INTRAMUSCULAR | Status: AC
  Administered 2017-07-08: 5 mL
  Filled 2017-07-08: qty 5

## 2017-07-08 NOTE — Discharge Instructions (Signed)
You have been seen in the Emergency Department (ED) today for an abscess.  This was drained in the ED.  Please follow up with your doctor or in the ED in 24-48 hours for recheck of your wound.  Read through the additional discharge instructions included below regarding wound care recommendations.  Keep the wound clean and dry, though you may wash as you would normally.  Change the dressing twice daily.  Call your doctor sooner or return to the ED if you develop worsening signs of infection such as: increased redness, increased pain, pus, or fever.   Abscess An abscess is an infected area that contains a collection of pus and debris. It can occur in almost any part of the body. An abscess is also known as a furuncle or boil. CAUSES  An abscess occurs when tissue gets infected. This can occur from blockage of oil or sweat glands, infection of hair follicles, or a minor injury to the skin. As the body tries to fight the infection, pus collects in the area and creates pressure under the skin. This pressure causes pain. People with weakened immune systems have difficulty fighting infections and get certain abscesses more often.  SYMPTOMS Usually an abscess develops on the skin and becomes a painful mass that is red, warm, and tender. If the abscess forms under the skin, you may feel a moveable soft area under the skin. Some abscesses break open (rupture) on their own, but most will continue to get worse without care. The infection can spread deeper into the body and eventually into the bloodstream, causing you to feel ill.  DIAGNOSIS  Your caregiver will take your medical history and perform a physical exam. A sample of fluid may also be taken from the abscess to determine what is causing your infection. TREATMENT  Your caregiver may prescribe antibiotic medicines to fight the infection. However, taking antibiotics alone usually does not cure an abscess. Your caregiver may need to make a small cut  (incision) in the abscess to drain the pus. In some cases, gauze is packed into the abscess to reduce pain and to continue draining the area. HOME CARE INSTRUCTIONS  Only take over-the-counter or prescription medicines for pain, discomfort, or fever as directed by your caregiver. If you were prescribed antibiotics, take them as directed. Finish them even if you start to feel better. If gauze is used, follow your caregiver's directions for changing the gauze. To avoid spreading the infection: Keep your draining abscess covered with a bandage. Wash your hands well. Do not share personal care items, towels, or whirlpools with others. Avoid skin contact with others. Keep your skin and clothes clean around the abscess. Keep all follow-up appointments as directed by your caregiver. SEEK MEDICAL CARE IF:  You have increased pain, swelling, redness, fluid drainage, or bleeding. You have muscle aches, chills, or a general ill feeling. You have a fever. MAKE SURE YOU:  Understand these instructions. Will watch your condition. Will get help right away if you are not doing well or get worse. Document Released: 04/12/2005 Document Revised: 01/02/2012 Document Reviewed: 09/15/2011 [facility] Patient Information 2015 [place], [place]. This information is not intended to replace advice given to you by your health care provider. Make sure you discuss any questions you have with your health care provider.  Abscess Care After An abscess (also called a boil or furuncle) is an infected area that contains a collection of pus. Signs and symptoms of an abscess include pain, tenderness, redness, or hardness,  or you may feel a moveable soft area under your skin. An abscess can occur anywhere in the body. The infection may spread to surrounding tissues causing cellulitis. A cut (incision) by the surgeon was made over your abscess and the pus was drained out. Gauze may have been packed into the space to provide a drain  that will allow the cavity to heal from the inside outwards. The boil may be painful for 5 to 7 days. Most people with a boil do not have high fevers. Your abscess, if seen early, may not have localized, and may not have been lanced. If not, another appointment may be required for this if it does not get better on its own or with medications. HOME CARE INSTRUCTIONS  Only take over-the-counter or prescription medicines for pain, discomfort, or fever as directed by your caregiver. When you bathe, soak and then remove gauze or iodoform packs at least daily or as directed by your caregiver. You may then wash the wound gently with mild soapy water. Repack with gauze or do as your caregiver directs. SEEK IMMEDIATE MEDICAL CARE IF:  You develop increased pain, swelling, redness, drainage, or bleeding in the wound site. You develop signs of generalized infection including muscle aches, chills, fever, or a general ill feeling. An oral temperature above 102 F (38.9 C) develops, not controlled by medication. See your caregiver for a recheck if you develop any of the symptoms described above. If medications (antibiotics) were prescribed, take them as directed. Document Released: 01/19/2005 Document Revised: 09/25/2011 Document Reviewed: 09/16/2007 [facility] Patient Information 2015 [place], [place]. This information is not intended to replace advice given to you by your health care provider. Make sure you discuss any questions you have with your health care provider.  Cellulitis Cellulitis is an infection of the skin and the tissue beneath it. The infected area is usually red and tender. Cellulitis occurs most often in the arms and lower legs.  CAUSES  Cellulitis is caused by bacteria that enter the skin through cracks or cuts in the skin. The most common types of bacteria that cause cellulitis are staphylococci and streptococci. SIGNS AND SYMPTOMS  Redness and warmth. Swelling. Tenderness or  pain. Fever. DIAGNOSIS  Your health care provider can usually determine what is wrong based on a physical exam. Blood tests may also be done. TREATMENT  Treatment usually involves taking an antibiotic medicine. HOME CARE INSTRUCTIONS  Take your antibiotic medicine as directed by your health care provider. Finish the antibiotic even if you start to feel better. Keep the infected arm or leg elevated to reduce swelling. Apply a warm cloth to the affected area up to 4 times per day to relieve pain. Take medicines only as directed by your health care provider. Keep all follow-up visits as directed by your health care provider. SEEK MEDICAL CARE IF:  You notice red streaks coming from the infected area. Your red area gets larger or turns dark in color. Your bone or joint underneath the infected area becomes painful after the skin has healed. Your infection returns in the same area or another area. You notice a swollen bump in the infected area. You develop new symptoms. You have a fever. SEEK IMMEDIATE MEDICAL CARE IF:  You feel very sleepy. You develop vomiting or diarrhea. You have a general ill feeling (malaise) with muscle aches and pains. MAKE SURE YOU:  Understand these instructions. Will watch your condition. Will get help right away if you are not doing well or get  worse. Document Released: 04/12/2005 Document Revised: 11/17/2013 Document Reviewed: 09/18/2011 [facility] Patient Information 2015 [place], [place]. This information is not intended to replace advice given to you by your health care provider. Make sure you discuss any questions you have with your health care provider.

## 2017-07-08 NOTE — ED Provider Notes (Signed)
Emergency Department Provider Note   I have reviewed the triage vital signs and the nursing notes.   HISTORY  Chief Complaint Abscess   HPI Sandra Bishop is a 46 y.o. female with PMH of anemia to the emergency department for evaluation of left axilla abscess.  The lesion emerged 2 weeks ago.  Patient has had moderate pain in the area with drainage that started today.  She denies any fevers or shaking chills.  She feels that the lesion is getting larger.  No similar lesions in the past.  No numbness or tingling in the arm.  Pain is moderate and worse with movement or touching the area.  No modifying factors. No radiation of symptoms.    Past Medical History:  Diagnosis Date  . Allergy   . Anemia   . Constipation   . Obesity     Patient Active Problem List   Diagnosis Date Noted  . Anemia   . Obesity   . Pancreatitis 09/02/2011  . Constipation 08/30/2011    Past Surgical History:  Procedure Laterality Date  . EYE SURGERY    . TUBAL LIGATION      Current Outpatient Rx  . Order #: [id number] Class: Historical Med  . Order #: [id number] Class: Normal  . Order #: [id number] Class: Print  . Order #: [id number] Class: Print    Allergies Patient has no known allergies.  Family History  Problem Relation Age of Onset  . Hypertension Mother   . Pancreatic cancer Father 60  . Heart failure Maternal Grandmother     Social History Social History   Tobacco Use  . Smoking status: Former Smoker    Types: Cigarettes    Last attempt to quit: 12/28/2010    Years since quitting: 6.5  . Smokeless tobacco: Never Used  . Tobacco comment: quit in 2012, previously smoked 15 years  Substance Use Topics  . Alcohol use: No    Alcohol/week: 0.0 oz  . Drug use: No    Review of Systems  Constitutional: No fever/chills Eyes: No visual changes. ENT: No sore throat. Cardiovascular: Denies chest pain. Respiratory: Denies shortness of breath. Gastrointestinal: No abdominal pain.   No nausea, no vomiting.  No diarrhea.  No constipation. Genitourinary: Negative for dysuria. Musculoskeletal: Negative for back pain. Skin: Negative for rash. Abscess in the left axilla.  Neurological: Negative for headaches, focal weakness or numbness.  10-point ROS otherwise negative.  ____________________________________________   PHYSICAL EXAM:  VITAL SIGNS: ED Triage Vitals  Enc Vitals Group     BP 07/08/17 2102 128/82     Pulse Rate 07/08/17 2102 90     Resp 07/08/17 2102 20     Temp 07/08/17 2102 98 F (36.7 C)     Temp Source 07/08/17 2102 Oral     SpO2 07/08/17 2102 98 %     Weight 07/08/17 2059 230 lb (104.3 kg)     Height 07/08/17 2059 5\' 2"  (1.575 m)     Pain Score 07/08/17 2058 6   Constitutional: Alert and oriented. Well appearing and in no acute distress. Eyes: Conjunctivae are normal.  Head: Atraumatic. Nose: No congestion/rhinnorhea. Mouth/Throat: Mucous membranes are moist.  Neck: No stridor. Cardiovascular: Good peripheral circulation.  Respiratory: Normal respiratory effort.  Gastrointestinal: No distention.  Musculoskeletal: No lower extremity tenderness nor edema. No gross deformities of extremities. Neurologic:  Normal speech and language. No gross focal neurologic deficits are appreciated.  Skin:  Skin is warm, dry and intact. 3 cm abscess in the  left axilla with some surrounding induration but no cellulitis. Small area with purulent drainage noted.   ____________________________________________  RADIOLOGY  None ____________________________________________   PROCEDURES  Procedure(s) performed:   Marland KitchenMarland KitchenIncision and Drainage Date/Time: 07/08/2017 10:06 PM Performed by: Margette Fast, MD Authorized by: Margette Fast, MD   Consent:    Consent obtained:  Verbal   Consent given by:  Patient   Risks discussed:  Bleeding, incomplete drainage, pain, infection and damage to other organs   Alternatives discussed:  Alternative  treatment Location:    Type:  Abscess   Size:  3   Location:  Upper extremity   Upper extremity location: left axilla. Pre-procedure details:    Skin preparation:  Betadine Anesthesia (see MAR for exact dosages):    Anesthesia method:  Local infiltration   Local anesthetic:  Lidocaine 1% w/o epi Procedure type:    Complexity:  Simple Procedure details:    Needle aspiration: no     Incision types:  Single straight   Incision depth:  Subcutaneous   Scalpel blade:  11   Wound management:  Probed and deloculated   Drainage:  Bloody and purulent   Drainage amount:  Copious   Wound treatment:  Wound left open   Packing materials:  None Post-procedure details:    Patient tolerance of procedure:  Tolerated well, no immediate complications   ____________________________________________   INITIAL IMPRESSION / ASSESSMENT AND PLAN / ED COURSE  Pertinent labs & imaging results that were available during my care of the patient were reviewed by me and considered in my medical decision making (see chart for details).  Patient presents to the emergency department with left axillary abscess.  There is some purulent drainage coming from a small opening on the skin.  Plan for incision and drainage.  Discussed the procedure, risks, benefits with the patient in detail.  I&D performed as above. Will give bactrim and discussed wound care in detail with the patient.   At this time, I do not feel there is any life-threatening condition present. I have reviewed and discussed all results (EKG, imaging, lab, urine as appropriate), exam findings with patient. I have reviewed nursing notes and appropriate previous records.  I feel the patient is safe to be discharged home without further emergent workup. Discussed usual and customary return precautions. Patient and family (if present) verbalize understanding and are comfortable with this plan.  Patient will follow-up with their primary care provider. If they  do not have a primary care provider, information for follow-up has been provided to them. All questions have been answered.  ____________________________________________  FINAL CLINICAL IMPRESSION(S) / ED DIAGNOSES  Final diagnoses:  Abscess of left axilla     MEDICATIONS GIVEN DURING THIS VISIT:  Medications  lidocaine (PF) (XYLOCAINE) 1 % injection 5 mL (5 mLs Infiltration Given 07/08/17 2119)  ibuprofen (ADVIL,MOTRIN) tablet 800 mg (800 mg Oral Given 07/08/17 2220)     NEW OUTPATIENT MEDICATIONS STARTED DURING THIS VISIT:  Bactrim   Note:  This document was prepared using Dragon voice recognition software and may include unintentional dictation errors.  Nanda Quinton, MD Emergency Medicine    Anetha Slagel, [facility], MD 07/08/17 [phone]

## 2017-07-08 NOTE — ED Triage Notes (Signed)
PT reports left axilla abscess. Onset 2 weeks ago. Denies history of same. Reports associated drainage (brown).

## 2017-10-10 ENCOUNTER — Other Ambulatory Visit: Payer: Self-pay | Admitting: Occupational Medicine

## 2017-10-10 ENCOUNTER — Ambulatory Visit: Payer: Self-pay

## 2017-10-10 DIAGNOSIS — M79674 Pain in right toe(s): Secondary | ICD-10-CM
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
## 2018-02-14 ENCOUNTER — Other Ambulatory Visit: Payer: Self-pay | Admitting: Family Medicine

## 2018-02-14 DIAGNOSIS — Z1231 Encounter for screening mammogram for malignant neoplasm of breast: Secondary | ICD-10-CM

## 2018-05-13 ENCOUNTER — Inpatient Hospital Stay: Admission: RE | Admit: 2018-05-13 | Payer: Self-pay | Source: Ambulatory Visit

## 2018-05-16 ENCOUNTER — Ambulatory Visit: Payer: Self-pay

## 2018-06-24 ENCOUNTER — Ambulatory Visit: Payer: Self-pay

## 2018-08-12 ENCOUNTER — Ambulatory Visit: Payer: Self-pay

## 2018-08-12 ENCOUNTER — Other Ambulatory Visit: Payer: Self-pay | Admitting: Occupational Medicine

## 2018-08-12 DIAGNOSIS — M25511 Pain in right shoulder: Secondary | ICD-10-CM

## 2018-08-12 IMAGING — DX DG SHOULDER 2+V*R*
4 series · 4 of 4 positions shown · non-contrast
Comparison: None.

CLINICAL DATA: Right shoulder pain from lifting a [REDACTED]/lpgright shoulder pain

EXAM:
RIGHT SHOULDER - 2+ VIEW

[shoulder ap (1 of 2)]
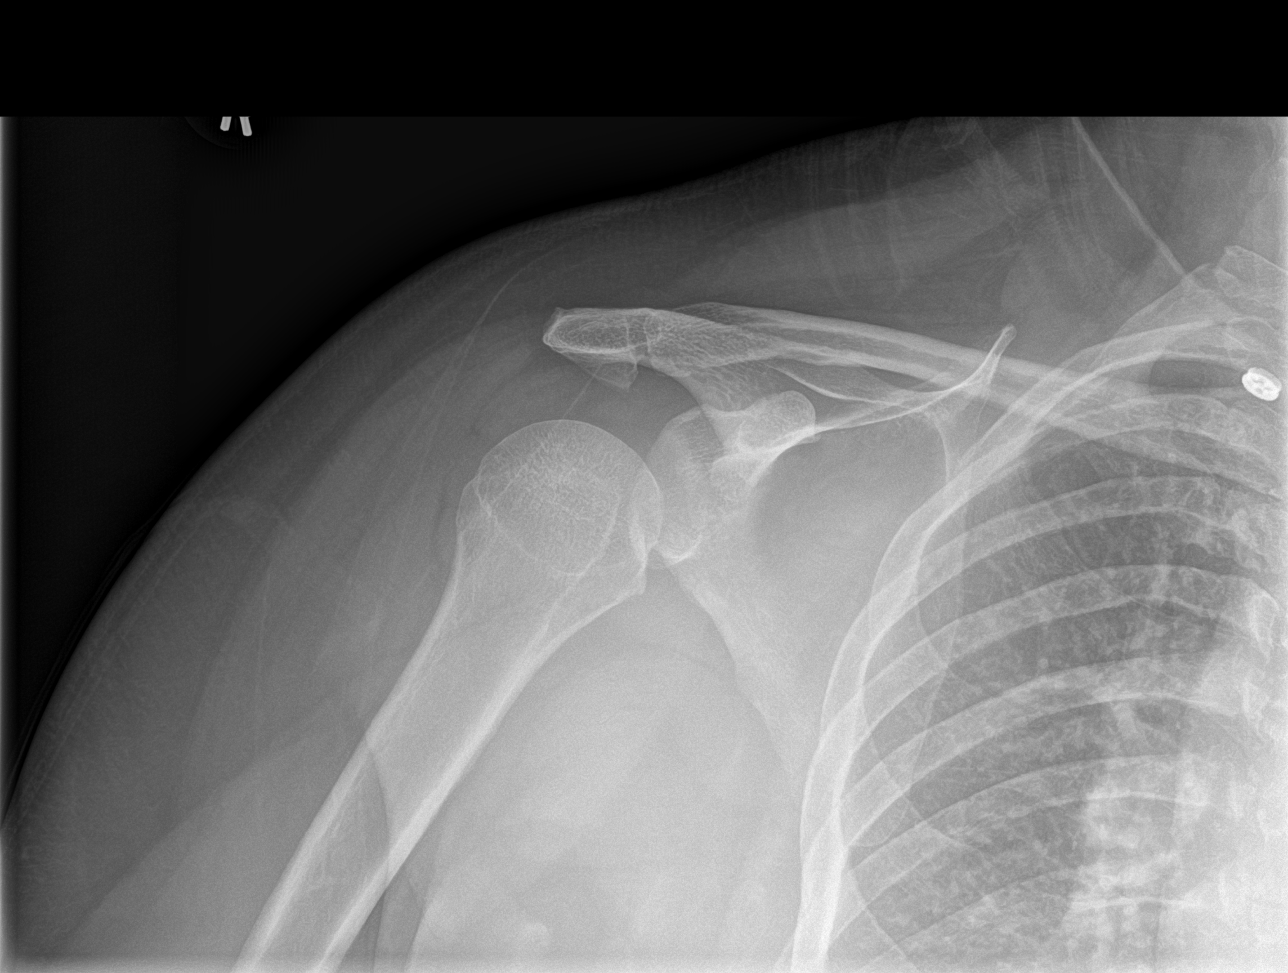

[shoulder ap (2 of 2)]
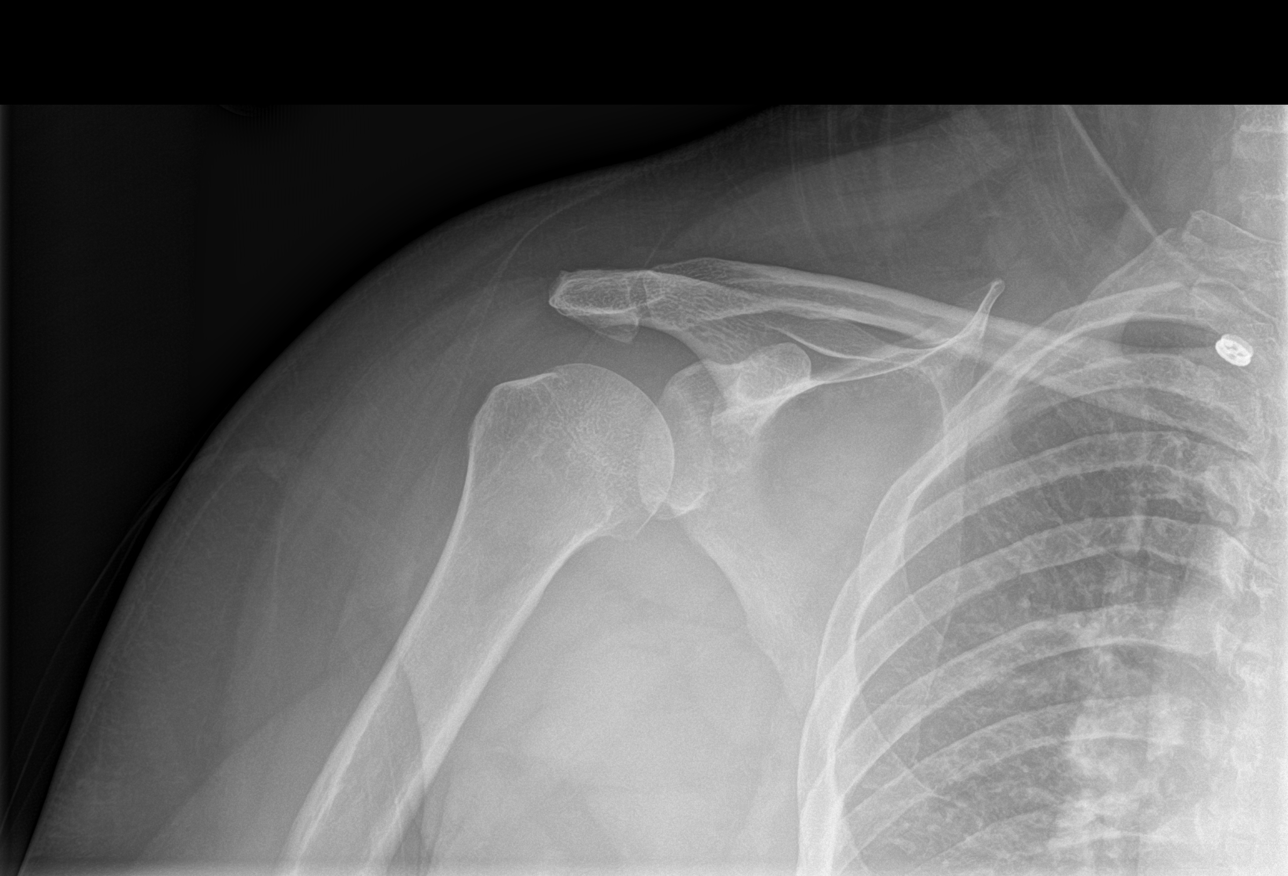

[shoulder y-view]
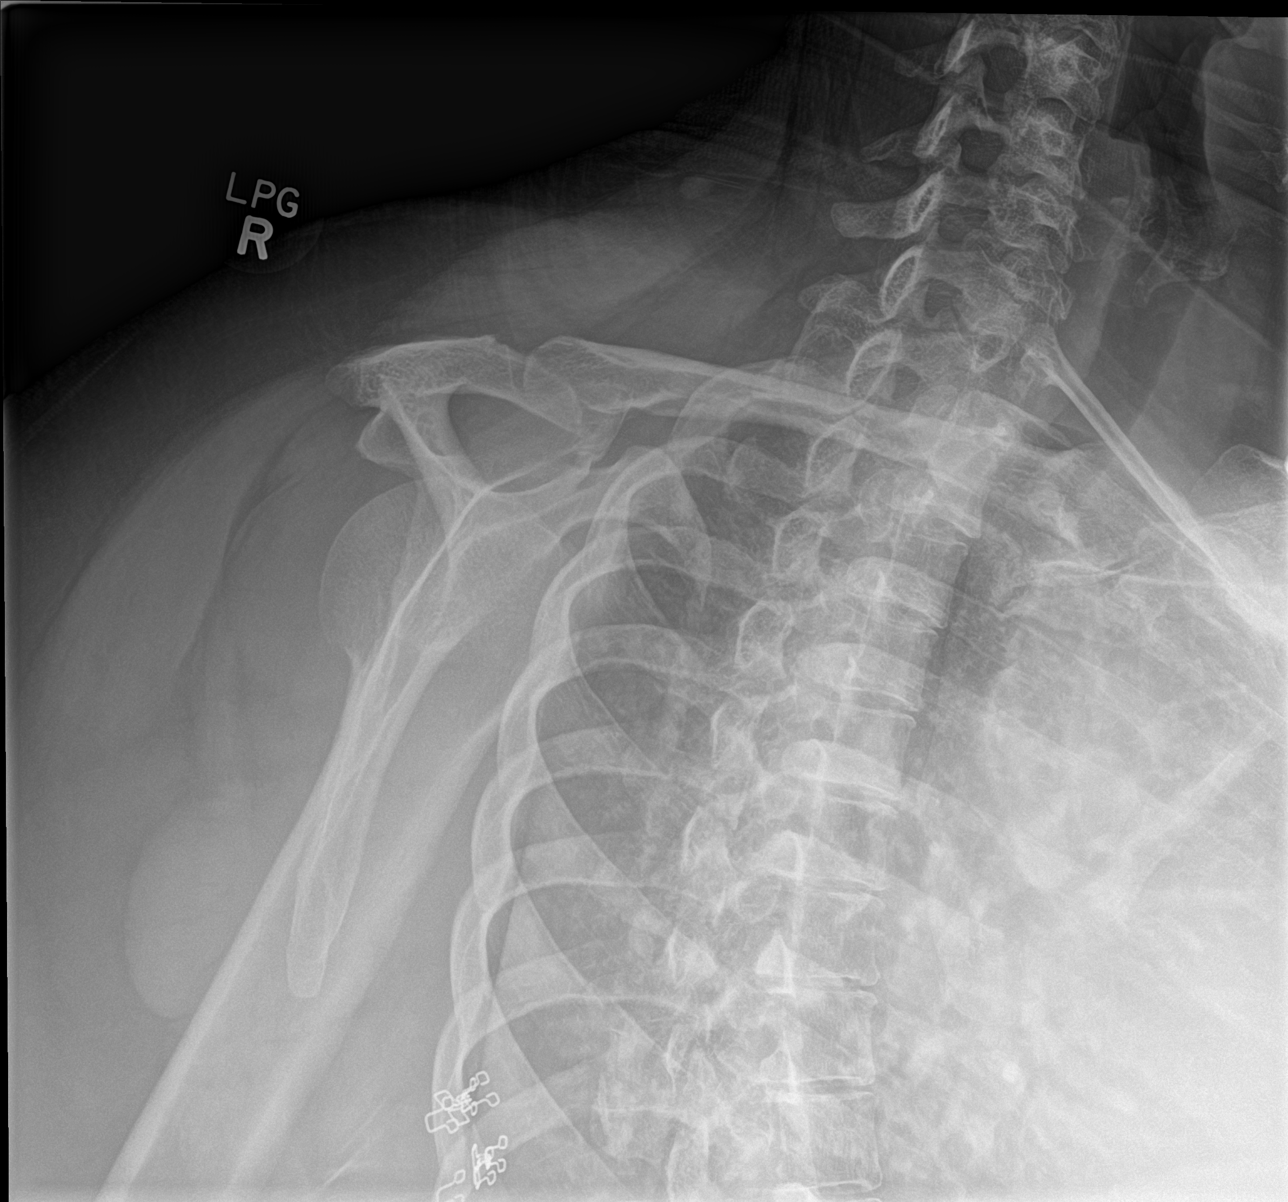

[shoulder axial]
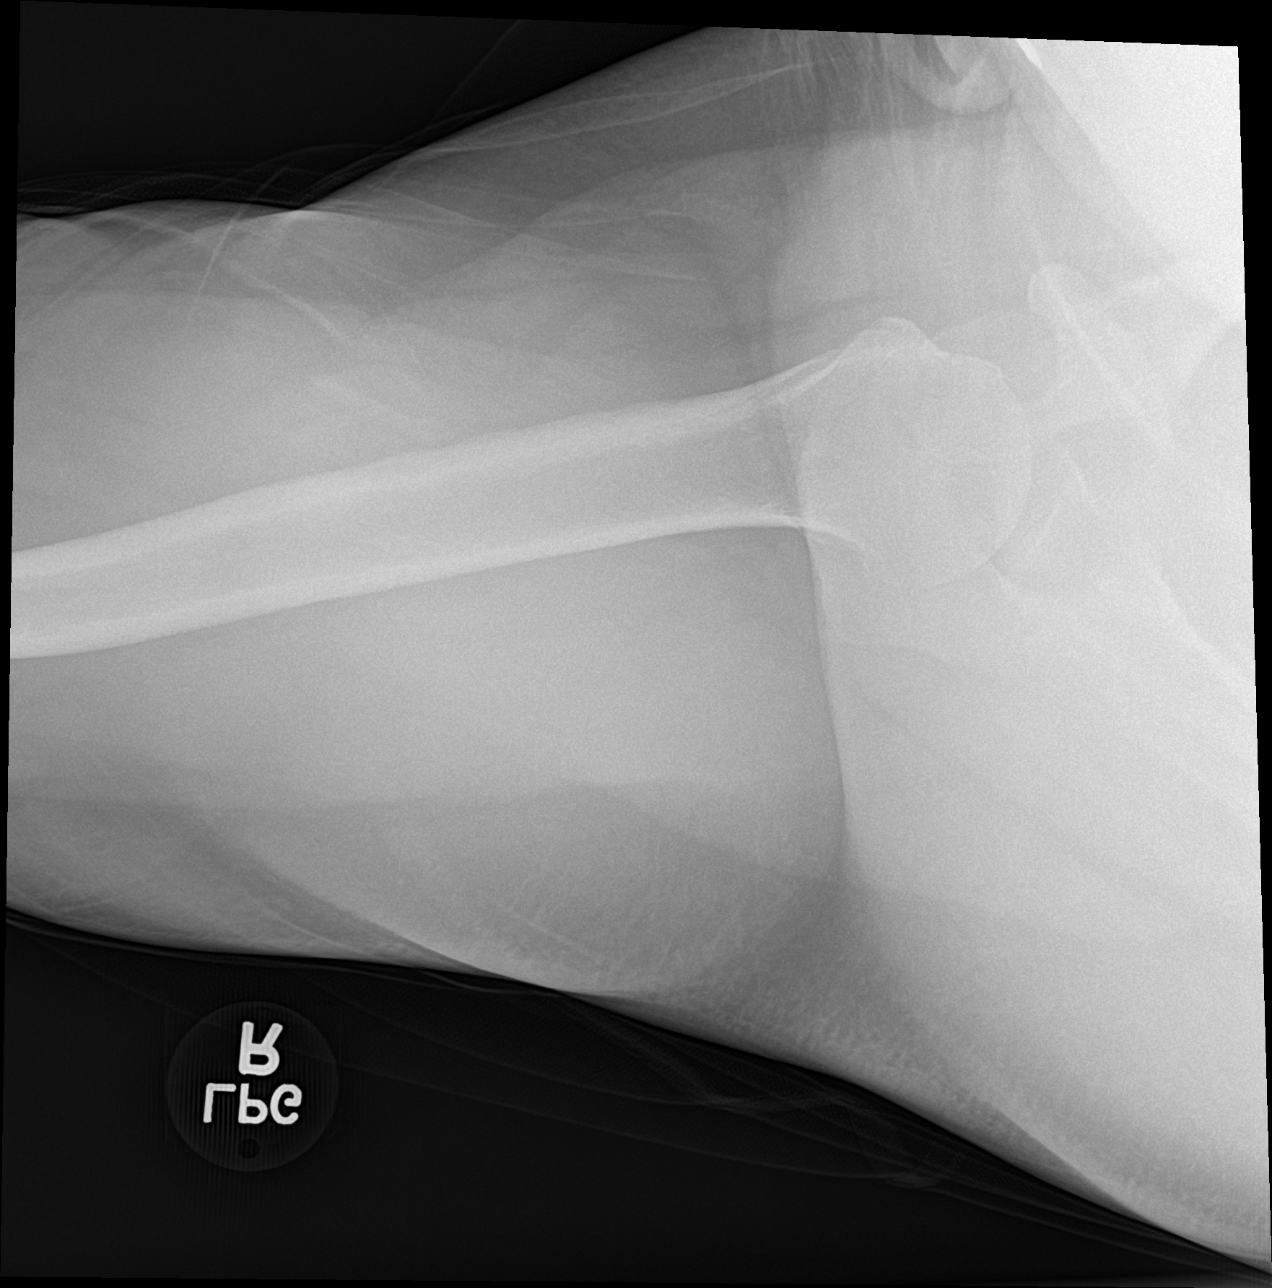

[4 of 4 positions shown; findings below may reference images not displayed]

FINDINGS: Glenohumeral joint is intact. No evidence of scapular fracture or
humeral fracture. The acromioclavicular joint is intact.
IMPRESSION: No fracture or dislocation.

## 2018-09-25 ENCOUNTER — Emergency Department (HOSPITAL_BASED_OUTPATIENT_CLINIC_OR_DEPARTMENT_OTHER)
Admission: EM | Admit: 2018-09-25 | Discharge: 2018-09-25 | Disposition: A | Discharge status: Home / Self Care | Payer: BC Managed Care – PPO | Attending: Emergency Medicine | Admitting: Emergency Medicine

## 2018-09-25 ENCOUNTER — Encounter (HOSPITAL_BASED_OUTPATIENT_CLINIC_OR_DEPARTMENT_OTHER): Payer: Self-pay

## 2018-09-25 ENCOUNTER — Other Ambulatory Visit: Payer: Self-pay

## 2018-09-25 DIAGNOSIS — Z87891 Personal history of nicotine dependence: Secondary | ICD-10-CM | POA: Diagnosis not present

## 2018-09-25 DIAGNOSIS — N72 Inflammatory disease of cervix uteri: Secondary | ICD-10-CM | POA: Diagnosis not present

## 2018-09-25 DIAGNOSIS — N898 Other specified noninflammatory disorders of vagina: Secondary | ICD-10-CM | POA: Diagnosis present

## 2018-09-25 LAB — WET PREP, GENITAL
CLUE CELLS WET PREP: NONE SEEN
SPERM: NONE SEEN
TRICH WET PREP: NONE SEEN
YEAST WET PREP: NONE SEEN

## 2018-09-25 LAB — URINALYSIS, ROUTINE W REFLEX MICROSCOPIC
BILIRUBIN URINE: NEGATIVE
Glucose, UA: NEGATIVE mg/dL
HGB URINE DIPSTICK: NEGATIVE
Ketones, ur: NEGATIVE mg/dL
Leukocytes,Ua: NEGATIVE
Nitrite: NEGATIVE
PH: 5.5 (ref 5.0–8.0)
Protein, ur: NEGATIVE mg/dL
Specific Gravity, Urine: 1.025 (ref 1.005–1.030)

## 2018-09-25 LAB — PREGNANCY, URINE: Preg Test, Ur: NEGATIVE

## 2018-09-25 MED ORDER — CEFTRIAXONE SODIUM 250 MG IJ SOLR
250.0000 mg | Freq: Once | INTRAMUSCULAR | Status: AC
  Administered 2018-09-25: 250 mg via INTRAMUSCULAR
  Filled 2018-09-25: qty 250

## 2018-09-25 MED ORDER — AZITHROMYCIN 250 MG PO TABS
1000.0000 mg | ORAL_TABLET | Freq: Once | ORAL | Status: AC
  Administered 2018-09-25: 1000 mg via ORAL
  Filled 2018-09-25: qty 4

## 2018-09-25 MED ORDER — LIDOCAINE HCL (PF) 1 % IJ SOLN
INTRAMUSCULAR | Status: AC
  Administered 2018-09-25: 1.2 mL
  Filled 2018-09-25: qty 5

## 2018-09-25 NOTE — ED Provider Notes (Signed)
[facility] EMERGENCY DEPARTMENT Provider Note   CSN: [id number] Arrival date & time: 09/25/18  1526    History   Chief Complaint Chief Complaint  Patient presents with  . Vaginal Discharge    HPI Sandra Bishop is a 48 y.o. female.     HPI Patient presents to the emergency room for evaluation of vaginal irritation and discomfort.  Patient states she has had some burning and itching irritation in the vaginal region.  She has had some slight discharge as well.  She has had some generalized achiness in her back, upper legs and lower abdomen.  She denies any fevers or chills.  No vomiting or diarrhea.  No complaints of any rashes. Past Medical History:  Diagnosis Date  . Allergy   . Anemia   . Constipation   . Obesity     Patient Active Problem List   Diagnosis Date Noted  . Anemia   . Obesity   . Pancreatitis 09/02/2011  . Constipation 08/30/2011    Past Surgical History:  Procedure Laterality Date  . EYE SURGERY    . TUBAL LIGATION       OB History   No obstetric history on file.      Home Medications    Prior to Admission medications   Not on File    Family History Family History  Problem Relation Age of Onset  . Hypertension Mother   . Pancreatic cancer Father 45  . Heart failure Maternal Grandmother     Social History Social History   Tobacco Use  . Smoking status: Former Smoker    Types: Cigarettes    Last attempt to quit: 12/28/2010    Years since quitting: 7.7  . Smokeless tobacco: Never Used  . Tobacco comment: quit in 2012, previously smoked 15 years  Substance Use Topics  . Alcohol use: No    Alcohol/week: 0.0 standard drinks  . Drug use: No     Allergies   Patient has no known allergies.   Review of Systems Review of Systems  All other systems reviewed and are negative.    Physical Exam Updated Vital Signs BP (!) 150/86 (BP Location: Left Arm)   Pulse 100   Temp 98.3 F (36.8 C) (Oral)   Resp 18   Ht  1.626 m (5\' 4" )   Wt 104.3 kg   LMP 09/11/2018   SpO2 100%   BMI 39.48 kg/m   Physical Exam Vitals signs and nursing note reviewed. Exam conducted with a chaperone present.  Constitutional:      General: She is not in acute distress.    Appearance: She is well-developed.  HENT:     Head: Normocephalic and atraumatic.     Right Ear: External ear normal.     Left Ear: External ear normal.  Eyes:     General: No scleral icterus.       Right eye: No discharge.        Left eye: No discharge.     Conjunctiva/sclera: Conjunctivae normal.  Neck:     Musculoskeletal: Neck supple.     Trachea: No tracheal deviation.  Cardiovascular:     Rate and Rhythm: Normal rate and regular rhythm.  Pulmonary:     Effort: Pulmonary effort is normal. No respiratory distress.     Breath sounds: Normal breath sounds. No stridor. No wheezing or rales.  Abdominal:     General: Bowel sounds are normal. There is no distension.  Palpations: Abdomen is soft.     Tenderness: There is no abdominal tenderness. There is no guarding or rebound.  Genitourinary:    General: Normal vulva.     Exam position: Supine.     Labia:        Right: No rash, tenderness or lesion.        Left: No rash, tenderness or lesion.      Vagina: Normal.     Cervix: Discharge present. No cervical motion tenderness.     Adnexa:        Right: No mass, tenderness or fullness.         Left: No mass, tenderness or fullness.       Comments: Scant thick white discharge noted in the vaginal vault Musculoskeletal:        General: No tenderness.  Skin:    General: Skin is warm and dry.     Findings: No rash.  Neurological:     Mental Status: She is alert.     Cranial Nerves: No cranial nerve deficit (no facial droop, extraocular movements intact, no slurred speech).     Sensory: No sensory deficit.     Motor: No abnormal muscle tone or seizure activity.     Coordination: Coordination normal.      ED Treatments / Results   Labs (all labs ordered are listed, but only abnormal results are displayed) Labs Reviewed  WET PREP, GENITAL - Abnormal; Notable for the following components:      Result Value   WBC, Wet Prep HPF POC MANY (*)    All other components within normal limits  PREGNANCY, URINE  URINALYSIS, ROUTINE W REFLEX MICROSCOPIC  GC/CHLAMYDIA PROBE AMP () NOT AT [facility]    Procedures Procedures (including critical care time)  Medications Ordered in ED Medications  cefTRIAXone (ROCEPHIN) injection 250 mg (has no administration in time range)  azithromycin (ZITHROMAX) tablet 1,000 mg (has no administration in time range)     Initial Impression / Assessment and Plan / ED Course  I have reviewed the triage vital signs and the nursing notes.  Pertinent labs & imaging results that were available during my care of the patient were reviewed by me and considered in my medical decision making (see chart for details).   Patient presented to the emergency room for evaluation of vaginal discharge.  Patient did have a new recent partner.  Patient does have white blood cells on wet prep.  Plan on treating her empirically for GC and chlamydia. Out  Patient follow-up. Final Clinical Impressions(s) / ED Diagnoses   Final diagnoses:  Cervicitis    ED Discharge Orders    None       Dorie Rank, MD 09/25/18 1726

## 2018-09-25 NOTE — ED Triage Notes (Signed)
Pt c/o vaginal discharge for a month and some generalized muscle aches

## 2018-09-25 NOTE — Discharge Instructions (Addendum)
Antibiotics as prescribed, follow-up with a primary care doctor to be rechecked if your symptoms do not resolve

## 2018-09-26 LAB — GC/CHLAMYDIA PROBE AMP (~~LOC~~) NOT AT ARMC
CHLAMYDIA, DNA PROBE: NEGATIVE
NEISSERIA GONORRHEA: NEGATIVE

## 2018-11-12 ENCOUNTER — Encounter: Payer: BC Managed Care – PPO | Admitting: Emergency Medicine

## 2018-12-16 ENCOUNTER — Other Ambulatory Visit: Payer: Self-pay | Admitting: *Deleted

## 2018-12-16 ENCOUNTER — Telehealth: Payer: Self-pay | Admitting: Family Medicine

## 2018-12-16 ENCOUNTER — Other Ambulatory Visit: Payer: Self-pay

## 2018-12-16 ENCOUNTER — Ambulatory Visit: Payer: BC Managed Care – PPO | Admitting: Family Medicine

## 2018-12-16 ENCOUNTER — Encounter: Payer: Self-pay | Admitting: Family Medicine

## 2018-12-16 VITALS — BP 134/72 | HR 107 | Temp 98.0°F | Ht 64.0 in | Wt 230.0 lb

## 2018-12-16 DIAGNOSIS — Z23 Encounter for immunization: Secondary | ICD-10-CM

## 2018-12-16 DIAGNOSIS — N760 Acute vaginitis: Secondary | ICD-10-CM

## 2018-12-16 MED ORDER — FLUCONAZOLE 150 MG PO TABS: 150.0000 mg | ORAL_TABLET | Freq: Once | ORAL | 0 refills | Status: DC

## 2018-12-16 MED ORDER — CLOTRIMAZOLE-BETAMETHASONE 1-0.05 % EX CREA: 1.0000 "application " | TOPICAL_CREAM | Freq: Two times a day (BID) | CUTANEOUS | 0 refills | Status: DC

## 2018-12-16 MED ORDER — FLUCONAZOLE 150 MG PO TABS: 150.0000 mg | ORAL_TABLET | Freq: Once | ORAL | 0 refills | Status: AC

## 2018-12-16 NOTE — Progress Notes (Signed)
Established Patient Office Visit  Subjective:  Patient ID: Sandra Bishop, female    DOB: 1971/02/24  Age: 48 y.o. MRN: [id number]  CC:  Chief Complaint  Patient presents with  . Vaginal Itching    having some discomfort and itching and having some discharge but feels very moist. been going on since mach and had a pap in March     HPI Sandra Bishop presents for    Patient reports that she has vaginal discharge and itching with some discomfort No abnormal bleeding No dysuria No fevers or chills No nausea or vomiting    Past Medical History:  Diagnosis Date  . Allergy   . Anemia   . Constipation   . Obesity     Past Surgical History:  Procedure Laterality Date  . EYE SURGERY    . TUBAL LIGATION      Family History  Problem Relation Age of Onset  . Hypertension Mother   . Pancreatic cancer Father 32  . Heart failure Maternal Grandmother     Social History   Socioeconomic History  . Marital status: Divorced    Spouse name: Not on file  . Number of children: Not on file  . Years of education: Not on file  . Highest education level: Not on file  Occupational History  . Not on file  Social Needs  . Financial resource strain: Not on file  . Food insecurity    Worry: Not on file    Inability: Not on file  . Transportation needs    Medical: Not on file    Non-medical: Not on file  Tobacco Use  . Smoking status: Former Smoker    Types: Cigarettes    Quit date: 12/28/2010    Years since quitting: 8.0  . Smokeless tobacco: Never Used  . Tobacco comment: quit in 2012, previously smoked 15 years  Substance and Sexual Activity  . Alcohol use: No    Alcohol/week: 0.0 standard drinks  . Drug use: No  . Sexual activity: Yes  Lifestyle  . Physical activity    Days per week: Not on file    Minutes per session: Not on file  . Stress: Not on file  Relationships  . Social Herbalist on phone: Not on file    Gets together: Not on file   Attends religious service: Not on file    Active member of club or organization: Not on file    Attends meetings of clubs or organizations: Not on file    Relationship status: Not on file  . Intimate partner violence    Fear of current or ex partner: Not on file    Emotionally abused: Not on file    Physically abused: Not on file    Forced sexual activity: Not on file  Other Topics Concern  . Not on file  Social History Narrative  . Not on file    No outpatient medications prior to visit.   No facility-administered medications prior to visit.     No Known Allergies  ROS Review of Systems    Objective:    Physical Exam  BP 134/72   Pulse (!) 107   Temp 98 F (36.7 C)   Ht 5\' 4"  (1.626 m)   Wt 230 lb (104.3 kg)   LMP 11/16/2018   SpO2 96%   BMI 39.48 kg/m  Wt Readings from Last 3 Encounters:  12/16/18 230 lb (104.3 kg)  09/25/18 230  lb (104.3 kg)  07/08/17 230 lb (104.3 kg)   Physical Exam  Constitutional: Oriented to person, place, and time. Appears well-developed and well-nourished.  HENT:  Head: Normocephalic and atraumatic.  Eyes: Conjunctivae and EOM are normal.  Cardiovascular: Normal rate, regular rhythm, normal heart sounds and intact distal pulses.  No murmur heard. Pulmonary/Chest: Effort normal and breath sounds normal. No stridor. No respiratory distress. Has no wheezes.  Neurological: Is alert and oriented to person, place, and time.  Skin: Skin is warm. Capillary refill takes less than 2 seconds.  Psychiatric: Has a normal mood and affect. Behavior is normal. Judgment and thought content normal.   Vaginal exam- Chaperone Present Labia normal bilaterally without skin lesions Urethral meatus normal appearing without erythema Vagina with thin discharge No CMT, ovaries small and not palpable Uterus midline, nontender   Health Maintenance Due  Topic Date Due  . HIV Screening  05/15/1986    There are no preventive care reminders to display  for this patient.  Lab Results  Component Value Date   TSH 0.56 09/29/2015   Lab Results  Component Value Date   WBC 9.1 09/29/2015   HGB 10.7 (L) 09/29/2015   HCT 34.8 (L) 09/29/2015   MCV 82.5 09/29/2015   PLT 282 09/29/2015   Lab Results  Component Value Date   NA 141 09/29/2015   K 4.1 09/29/2015   CO2 25 09/29/2015   GLUCOSE 92 09/29/2015   BUN 11 09/29/2015   CREATININE 0.66 09/29/2015   BILITOT 0.3 09/29/2015   ALKPHOS 61 09/29/2015   AST 12 09/29/2015   ALT 13 09/29/2015   PROT 7.7 09/29/2015   ALBUMIN 4.1 09/29/2015   CALCIUM 9.0 09/29/2015   ANIONGAP 12 09/29/2015   Lab Results  Component Value Date   CHOL 111 (L) 09/29/2015   Lab Results  Component Value Date   HDL 53 09/29/2015   Lab Results  Component Value Date   LDLCALC 48 09/29/2015   Lab Results  Component Value Date   TRIG 51 09/29/2015   Lab Results  Component Value Date   CHOLHDL 2.1 09/29/2015   Lab Results  Component Value Date   HGBA1C 6.1 09/29/2015      Assessment & Plan:   Problem List Items Addressed This Visit    None    Visit Diagnoses    Vaginitis and vulvovaginitis    -  Primary -  Neg for yeast and bv   Relevant Orders   WET PREP FOR [place], [place], [place] (Completed)   Care order/instruction: (Completed)   Need for Tdap vaccination       Relevant Orders   Tdap vaccine greater than or equal to 7yo IM (Completed)      Meds ordered this encounter  Medications  . DISCONTD: fluconazole (DIFLUCAN) 150 MG tablet    Sig: Take 1 tablet (150 mg total) by mouth once for 1 dose. Repeat second dose in 3 days.    Dispense:  1 tablet    Refill:  0  . DISCONTD: fluconazole (DIFLUCAN) 150 MG tablet    Sig: Take 1 tablet (150 mg total) by mouth once for 1 dose. Repeat second dose in 3 days.    Dispense:  2 tablet    Refill:  0  . DISCONTD: clotrimazole-betamethasone (LOTRISONE) cream    Sig: Apply 1 application topically 2 (two) times daily. To external vaginal area     Dispense:  30 g    Refill:  0  Follow-up: No follow-ups on file.    Forrest Moron, MD

## 2018-12-16 NOTE — Patient Instructions (Addendum)
Please pick up Culturelle Probiotic to take once  You complete yeast medication     If you have lab work done today you will be contacted with your lab results within the next 2 weeks.  If you have not heard from [place] then please contact us. The fastest way to get your results is to register for My Chart.   IF you received an x-ray today, you will receive an invoice from [facility]. Please contact [facility] at [phone] with questions or concerns regarding your invoice.   IF you received labwork today, you will receive an invoice from [place]. Please contact [facility] at [phone] with questions or concerns regarding your invoice.   Our billing staff will not be able to assist you with questions regarding bills from these companies.  You will be contacted with the lab results as soon as they are available. The fastest way to get your results is to activate your My Chart account. Instructions are located on the last page of this paperwork. If you have not heard from [place] regarding the results in 2 weeks, please contact this office.     Vaginitis Vaginitis is a condition in which the vaginal tissue swells and becomes red (inflamed). This condition is most often caused by a change in the normal balance of bacteria and yeast that live in the vagina. This change causes an overgrowth of certain bacteria or yeast, which causes the inflammation. There are different types of vaginitis, but the most common types are:  Bacterial vaginosis.  Yeast infection (candidiasis).  Trichomoniasis vaginitis. This is a sexually transmitted disease (STD).  Viral vaginitis.  Atrophic vaginitis.  Allergic vaginitis. What are the causes? The cause of this condition depends on the type of vaginitis. It can be caused by:  Bacteria (bacterial vaginosis).  Yeast, which is a fungus (yeast infection).  A parasite (trichomoniasis vaginitis).  A virus (viral vaginitis).  Low hormone  levels (atrophic vaginitis). Low hormone levels can occur during pregnancy, breastfeeding, or after menopause.  Irritants, such as bubble baths, scented tampons, and feminine sprays (allergic vaginitis). Other factors can change the normal balance of the yeast and bacteria that live in the vagina. These include:  Antibiotic medicines.  Poor hygiene.  Diaphragms, vaginal sponges, spermicides, birth control pills, and intrauterine devices (IUD).  Sex.  Infection.  Uncontrolled diabetes.  A weakened defense (immune) system. What increases the risk? This condition is more likely to develop in women who:  Smoke.  Use vaginal douches, scented tampons, or scented sanitary pads.  Wear tight-fitting pants.  Wear thong underwear.  Use oral birth control pills or an IUD.  Have sex without a condom.  Have multiple sex partners.  Have an STD.  Frequently use the spermicide nonoxynol-9.  Eat lots of foods high in sugar.  Have uncontrolled diabetes.  Have low estrogen levels.  Have a weakened immune system from an immune disorder or medical treatment.  Are pregnant or breastfeeding. What are the signs or symptoms? Symptoms vary depending on the cause of the vaginitis. Common symptoms include:  Abnormal vaginal discharge. ? The discharge is white, gray, or yellow with bacterial vaginosis. ? The discharge is thick, white, and cheesy with a yeast infection. ? The discharge is frothy and yellow or greenish with trichomoniasis.  A bad vaginal smell. The smell is fishy with bacterial vaginosis.  Vaginal itching, pain, or swelling.  Sex that is painful.  Pain or burning when urinating. Sometimes there are no symptoms. How is this  diagnosed? This condition is diagnosed based on your symptoms and medical history. A physical exam, including a pelvic exam, will also be done. You may also have other tests, including:  Tests to determine the pH level (acidity or alkalinity) of  your vagina.  A whiff test, to assess the odor that results when a sample of your vaginal discharge is mixed with a potassium hydroxide solution.  Tests of vaginal fluid. A sample will be examined under a microscope. How is this treated? Treatment varies depending on the type of vaginitis you have. Your treatment may include:  Antibiotic creams or pills to treat bacterial vaginosis and trichomoniasis.  Antifungal medicines, such as vaginal creams or suppositories, to treat a yeast infection.  Medicine to ease discomfort if you have viral vaginitis. Your sexual partner should also be treated.  Estrogen delivered in a cream, pill, suppository, or vaginal ring to treat atrophic vaginitis. If vaginal dryness occurs, lubricants and moisturizing creams may help. You may need to avoid scented soaps, sprays, or douches.  Stopping use of a product that is causing allergic vaginitis. Then using a vaginal cream to treat the symptoms. Follow these instructions at home: Lifestyle  Keep your genital area clean and dry. Avoid soap, and only rinse the area with water.  Do not douche or use tampons until your health care provider says it is okay to do so. Use sanitary pads, if needed.  Do not have sex until your health care provider approves. When you can return to sex, practice safe sex and use condoms.  Wipe from front to back. This avoids the spread of bacteria from the rectum to the vagina. General instructions  Take over-the-counter and prescription medicines only as told by your health care provider.  If you were prescribed an antibiotic medicine, take or use it as told by your health care provider. Do not stop taking or using the antibiotic even if you start to feel better.  Keep all follow-up visits as told by your health care provider. This is important. How is this prevented?  Use mild, non-scented products. Do not use things that can irritate the vagina, such as fabric softeners. Avoid  the following products if they are scented: ? Feminine sprays. ? Detergents. ? Tampons. ? Feminine hygiene products. ? Soaps or bubble baths.  Let air reach your genital area. ? Wear cotton underwear to reduce moisture buildup. ? Avoid wearing underwear while you sleep. ? Avoid wearing tight pants and underwear or nylons without a cotton panel. ? Avoid wearing thong underwear.  Take off any wet clothing, such as bathing suits, as soon as possible.  Practice safe sex and use condoms. Contact a health care provider if:  You have abdominal pain.  You have a fever.  You have symptoms that last for more than 2-3 days. Get help right away if:  You have a fever and your symptoms suddenly get worse. Summary  Vaginitis is a condition in which the vaginal tissue becomes inflamed.This condition is most often caused by a change in the normal balance of bacteria and yeast that live in the vagina.  Treatment varies depending on the type of vaginitis you have.  Do not douche, use tampons , or have sex until your health care provider approves. When you can return to sex, practice safe sex and use condoms. This information is not intended to replace advice given to you by your health care provider. Make sure you discuss any questions you have with your health  care provider. Document Released: 04/30/2007 Document Revised: 08/08/2016 Document Reviewed: 08/08/2016 Elsevier Interactive Patient Education  2019 [employer].

## 2018-12-16 NOTE — Telephone Encounter (Signed)
Copied from [place] [id number]. Topic: General - Other >> Dec 16, 2018  1:30 PM Mcneil, Ja-Kwan wrote: Reason for CRM: Pt stated she was prescribed some medication today but the pharmacy that it was sent to is closed. Pt requests that the Rx be sent to [facility], [place] AT [address] [phone] (Phone) [phone] (Fax)

## 2018-12-16 NOTE — Telephone Encounter (Signed)
Prescription sent

## 2018-12-17 LAB — WET PREP FOR TRICH, YEAST, CLUE
Clue Cell Exam: NEGATIVE
Trichomonas Exam: NEGATIVE
Yeast Exam: NEGATIVE

## 2019-01-07 ENCOUNTER — Ambulatory Visit: Payer: Self-pay

## 2019-01-07 NOTE — Telephone Encounter (Signed)
Incoming call from a pt with a complaint of vaginal discharge continuing.  Patient states that she has done every thing  Dr. Nolon Rod has requested.   Has a slight odor and the color of yellow and white in color.  Itching occurring.  Patient request appointment.  To be seen.                Sandra Bishop Female, 48 y.o., 27-Dec-1970 MRN:  [id number] Phone:  [phone] Jerilynn Mages) PCP:  Forrest Moron, MD Primary Cvg:  [place] Message from [place] sent at 01/07/2019 11:27 AM EDT  Pt was given clotrimazole-betamethasone (LOTRISONE) cream. She said it was working, now it is not working. Pt has thick discharge and itching. She is also taking probiotic. Pt wants to know what to do next. Please call     Call History   Type Contact  01/07/2019 11:25 AM EDT Phone (Incoming) Luz Brazen (Self)  Phone: [phone] (H)  User: Nils Flack    Reason for Disposition . Abnormal color vaginal discharge (i.e., yellow, green, gray)  Answer Assessment - Initial Assessment Questions 1. DISCHARGE: "Describe the discharge." (e.g., white, yellow, green, gray, foamy, cottage cheese-like)     Yellowish white 2. ODOR: "Is there a bad odor?"     Yes and white 3. ONSET: "When did the discharge begin?"    yesterday 4. RASH: "Is there a rash in that area?" If so, ask: "Describe it." (e.g., redness, blisters, sores, bumps)     denies 5. ABDOMINAL PAIN: "Are you having any abdominal pain?" If yes: "What does it feel like? " (e.g., crampy, dull, intermittent, constant)      denies 6. ABDOMINAL PAIN SEVERITY: If present, ask: "How bad is it?"  (e.g., mild, moderate, severe)  - MILD - doesn't interfere with normal activities   - MODERATE - interferes with normal activities or awakens from sleep   - SEVERE - patient doesn't want to move (R/O peritonitis)      denies 7. CAUSE: "What do you think is causing the discharge?" "Have you had the  same problem before? What happened then?"      8. OTHER SYMPTOMS: "Do you have any other symptoms?" (e.g., fever, itching, vaginal bleeding, pain with urination, injury to genital area, vaginal foreign body)    Itching denies pain with urination 9. PREGNANCY: "Is there any chance you are pregnant?" "When was your last menstrual period?"     Last visit  Use tampons  Protocols used: VAGINAL DISCHARGE-A-AH

## 2019-01-07 NOTE — Telephone Encounter (Signed)
LVM FOR PATIENT TO CB AND SCHEDULE

## 2019-01-08 ENCOUNTER — Ambulatory Visit: Payer: BC Managed Care – PPO | Admitting: Family Medicine

## 2019-01-08 ENCOUNTER — Other Ambulatory Visit: Payer: Self-pay

## 2019-01-08 ENCOUNTER — Encounter: Payer: Self-pay | Admitting: Family Medicine

## 2019-01-08 ENCOUNTER — Other Ambulatory Visit (HOSPITAL_COMMUNITY)
Admission: RE | Admit: 2019-01-08 | Discharge: 2019-01-08 | Disposition: A | Discharge status: Home / Self Care | Payer: BC Managed Care – PPO | Source: Ambulatory Visit | Attending: Family Medicine | Admitting: Family Medicine

## 2019-01-08 VITALS — BP 136/78 | HR 110 | Temp 98.6°F | Ht 62.6 in | Wt 229.2 lb

## 2019-01-08 DIAGNOSIS — N898 Other specified noninflammatory disorders of vagina: Secondary | ICD-10-CM

## 2019-01-08 DIAGNOSIS — N760 Acute vaginitis: Secondary | ICD-10-CM

## 2019-01-08 DIAGNOSIS — B9689 Other specified bacterial agents as the cause of diseases classified elsewhere: Secondary | ICD-10-CM | POA: Diagnosis not present

## 2019-01-08 LAB — POCT WET + KOH PREP
Trich by wet prep: ABSENT
Yeast by KOH: ABSENT
Yeast by wet prep: ABSENT

## 2019-01-08 LAB — POC MICROSCOPIC URINALYSIS (UMFC): Mucus: ABSENT

## 2019-01-08 LAB — POCT URINALYSIS DIP (MANUAL ENTRY)
Bilirubin, UA: NEGATIVE
Glucose, UA: 500 mg/dL — AB
Ketones, POC UA: NEGATIVE mg/dL
Leukocytes, UA: NEGATIVE
Nitrite, UA: NEGATIVE
Protein Ur, POC: NEGATIVE mg/dL
Spec Grav, UA: 1.015 (ref 1.010–1.025)
Urobilinogen, UA: 0.2 E.U./dL
pH, UA: 5 (ref 5.0–8.0)

## 2019-01-08 MED ORDER — CLINDAMYCIN PHOSPHATE 2 % VA CREA: 1.0000 | TOPICAL_CREAM | Freq: Every day | VAGINAL | 0 refills | Status: DC

## 2019-01-08 NOTE — Patient Instructions (Signed)
° ° ° °  If you have lab work done today you will be contacted with your lab results within the next 2 weeks.  If you have not heard from us then please contact us. The fastest way to get your results is to register for My Chart. ° ° °IF you received an x-ray today, you will receive an invoice from [facility]. Please contact [facility] at [phone] with questions or concerns regarding your invoice.  ° °IF you received labwork today, you will receive an invoice from [facility]. Please contact [facility] at [phone] with questions or concerns regarding your invoice.  ° °Our billing staff will not be able to assist you with questions regarding bills from these companies. ° °You will be contacted with the lab results as soon as they are available. The fastest way to get your results is to activate your My Chart account. Instructions are located on the last page of this paperwork. If you have not heard from us regarding the results in 2 weeks, please contact this office. °  ° ° ° °

## 2019-01-08 NOTE — Progress Notes (Signed)
Acute Office Visit  Subjective:    Patient ID: Sandra Bishop, female    DOB: Nov 27, 1970, 48 y.o.   MRN: [id number]  Chief Complaint  Patient presents with  . Vaginal Itching    X 3 day   . Vaginal Discharge    X 3 dayd    HPI Patient is in today for vaginal itching and discharge. Pt states 3rd pelvic exam this month. Pt with antibiotics given in ER for cervicitis-12/16/18 -tric, clue cell and yeast negative  Past Medical History:  Diagnosis Date  . Allergy   . Anemia   . Constipation   . Obesity     Past Surgical History:  Procedure Laterality Date  . EYE SURGERY    . TUBAL LIGATION      Family History  Problem Relation Age of Onset  . Hypertension Mother   . Pancreatic cancer Father 105  . Heart failure Maternal Grandmother     Social History   Socioeconomic History  . Marital status: Divorced    Spouse name: Not on file  . Number of children: 2  . Years of education: Not on file  . Highest education level: Not on file  Occupational History  . Not on file  Social Needs  . Financial resource strain: Not on file  . Food insecurity    Worry: Not on file    Inability: Not on file  . Transportation needs    Medical: Not on file    Non-medical: Not on file  Tobacco Use  . Smoking status: Former Smoker    Types: Cigarettes    Quit date: 12/28/2010    Years since quitting: 8.0  . Smokeless tobacco: Never Used  . Tobacco comment: quit in 2012, previously smoked 15 years  Substance and Sexual Activity  . Alcohol use: Yes    Alcohol/week: 0.0 standard drinks    Comment: occ  . Drug use: No  . Sexual activity: Yes  Lifestyle  . Physical activity    Days per week: Not on file    Minutes per session: Not on file  . Stress: Not on file  Relationships  . Social Herbalist on phone: Not on file    Gets together: Not on file    Attends religious service: Not on file    Active member of club or organization: Not on file    Attends meetings of  clubs or organizations: Not on file    Relationship status: Not on file  . Intimate partner violence    Fear of current or ex partner: Not on file    Emotionally abused: Not on file    Physically abused: Not on file    Forced sexual activity: Not on file  Other Topics Concern  . Not on file  Social History Narrative  . Not on file    Outpatient Medications Prior to Visit  Medication Sig Dispense Refill  . clotrimazole-betamethasone (LOTRISONE) cream Apply 1 application topically 2 (two) times daily. To external vaginal area 30 g 0   No facility-administered medications prior to visit.     No Known Allergies  ROS     Objective:    Physical Exam  Constitutional: She appears well-developed and well-nourished. No distress.  Genitourinary:    Uterus normal.     Vaginal discharge present.   drainage collected for wet prep No uterine enlargement No cervical motion tenderness Drainage thick yellow-green No lesions noted vulva-eryth, no swelling  BP  136/78 (BP Location: Left Arm, Patient Position: Sitting, Cuff Size: Large)   Pulse (!) 110   Temp 98.6 F (37 C) (Oral)   Ht 5' 2.6" (1.59 m)   Wt 229 lb 3.2 oz (104 kg)   LMP 12/19/2018 (Approximate)   SpO2 97%   BMI 41.12 kg/m  Wt Readings from Last 3 Encounters:  01/08/19 229 lb 3.2 oz (104 kg)  12/16/18 230 lb (104.3 kg)  09/25/18 230 lb (104.3 kg)    Health Maintenance Due  Topic Date Due  . HIV Screening  05/15/1986     Lab Results  Component Value Date   TSH 0.56 09/29/2015   Lab Results  Component Value Date   WBC 9.1 09/29/2015   HGB 10.7 (L) 09/29/2015   HCT 34.8 (L) 09/29/2015   MCV 82.5 09/29/2015   PLT 282 09/29/2015   Lab Results  Component Value Date   NA 141 09/29/2015   K 4.1 09/29/2015   CO2 25 09/29/2015   GLUCOSE 92 09/29/2015   BUN 11 09/29/2015   CREATININE 0.66 09/29/2015   BILITOT 0.3 09/29/2015   ALKPHOS 61 09/29/2015   AST 12 09/29/2015   ALT 13 09/29/2015   PROT 7.7  09/29/2015   ALBUMIN 4.1 09/29/2015   CALCIUM 9.0 09/29/2015   ANIONGAP 12 09/29/2015   Lab Results  Component Value Date   CHOL 111 (L) 09/29/2015   Lab Results  Component Value Date   HDL 53 09/29/2015   Lab Results  Component Value Date   LDLCALC 48 09/29/2015   Lab Results  Component Value Date   TRIG 51 09/29/2015   Lab Results  Component Value Date   CHOLHDL 2.1 09/29/2015   Lab Results  Component Value Date   HGBA1C 6.1 09/29/2015      1. Vaginal discharge Clue cells and bacteria on wet prep-GC/Gon sent to lab-cleocin vaginal cream-avoid soaps and baths to genital area - POCT Wet + KOH Prep - POCT Microscopic Urinalysis ([facility]) - POCT urinalysis dipstick - Cervicovaginal ancillary only Use zinc based diaper cream on inguinal fold while irritated 2. Bacterial vaginosis Cleocin vaginal cream Assessment & Plan:   Hannah Beat, MD

## 2019-01-09 ENCOUNTER — Telehealth: Payer: Self-pay | Admitting: Family Medicine

## 2019-01-09 ENCOUNTER — Other Ambulatory Visit: Payer: Self-pay

## 2019-01-09 MED ORDER — CLINDAMYCIN PHOSPHATE 2 % VA CREA: 1.0000 | TOPICAL_CREAM | Freq: Every day | VAGINAL | 0 refills | Status: DC

## 2019-01-09 NOTE — Telephone Encounter (Signed)
Rx was resent 

## 2019-01-09 NOTE — Telephone Encounter (Signed)
Pt needs clindamycin (CLEOCIN) 2 % vaginal cream To be resent to the correct pharmacy / please call once corrected    Send to :  [facility] [place], [place] AT [place] [phone] (Phone) [phone] (Fax)

## 2019-01-10 ENCOUNTER — Other Ambulatory Visit: Payer: Self-pay | Admitting: Family Medicine

## 2019-01-10 LAB — CERVICOVAGINAL ANCILLARY ONLY
Bacterial vaginitis: NEGATIVE
Candida vaginitis: POSITIVE — AB
Chlamydia: NEGATIVE
Neisseria Gonorrhea: NEGATIVE
Trichomonas: NEGATIVE

## 2019-01-10 MED ORDER — FLUCONAZOLE 150 MG PO TABS: ORAL_TABLET | ORAL | 0 refills | Status: DC

## 2019-01-10 NOTE — Progress Notes (Signed)
Add diflucan -lab results show yeast-I have sent prescription to the pharmacy

## 2019-02-20 ENCOUNTER — Encounter (HOSPITAL_COMMUNITY): Payer: Self-pay | Admitting: Emergency Medicine

## 2019-02-20 ENCOUNTER — Telehealth: Payer: Self-pay | Admitting: Family Medicine

## 2019-02-20 ENCOUNTER — Emergency Department (HOSPITAL_COMMUNITY)
Admission: EM | Admit: 2019-02-20 | Discharge: 2019-02-21 | Disposition: A | Discharge status: Home / Self Care | Payer: BC Managed Care – PPO | Attending: Emergency Medicine | Admitting: Emergency Medicine

## 2019-02-20 ENCOUNTER — Other Ambulatory Visit: Payer: Self-pay

## 2019-02-20 DIAGNOSIS — Z87891 Personal history of nicotine dependence: Secondary | ICD-10-CM | POA: Diagnosis not present

## 2019-02-20 DIAGNOSIS — Z7982 Long term (current) use of aspirin: Secondary | ICD-10-CM | POA: Diagnosis not present

## 2019-02-20 DIAGNOSIS — R739 Hyperglycemia, unspecified: Secondary | ICD-10-CM | POA: Diagnosis present

## 2019-02-20 LAB — URINALYSIS, ROUTINE W REFLEX MICROSCOPIC
Bacteria, UA: NONE SEEN
Bilirubin Urine: NEGATIVE
Glucose, UA: >=500 mg/dL — AB
Hgb urine dipstick: NEGATIVE
Ketones, ur: NEGATIVE mg/dL
Leukocytes,Ua: NEGATIVE
Nitrite: NEGATIVE
Protein, ur: NEGATIVE mg/dL
Specific Gravity, Urine: 1.032 — ABNORMAL HIGH (ref 1.005–1.030)
pH: 6 (ref 5.0–8.0)

## 2019-02-20 LAB — CBC
HCT: 39.3 % (ref 36.0–46.0)
Hemoglobin: 12.4 g/dL (ref 12.0–15.0)
MCH: 26.3 pg (ref 26.0–34.0)
MCHC: 31.6 g/dL (ref 30.0–36.0)
MCV: 83.3 fL (ref 80.0–100.0)
Platelets: 308 10*3/uL (ref 150–400)
RBC: 4.72 MIL/uL (ref 3.87–5.11)
RDW: 14 % (ref 11.5–15.5)
WBC: 10.1 10*3/uL (ref 4.0–10.5)
nRBC: 0 % (ref 0.0–0.2)

## 2019-02-20 LAB — BASIC METABOLIC PANEL
Anion gap: 10 (ref 5–15)
BUN: 13 mg/dL (ref 6–20)
CO2: 21 mmol/L — ABNORMAL LOW (ref 22–32)
Calcium: 9.3 mg/dL (ref 8.9–10.3)
Chloride: 103 mmol/L (ref 98–111)
Creatinine, Ser: 0.8 mg/dL (ref 0.44–1.00)
GFR calc Af Amer: >60 mL/min (ref >60)
GFR calc non Af Amer: >60 mL/min (ref >60)
Glucose, Bld: 351 mg/dL — ABNORMAL HIGH (ref 70–99)
Potassium: 4.1 mmol/L (ref 3.5–5.1)
Sodium: 134 mmol/L — ABNORMAL LOW (ref 135–145)

## 2019-02-20 LAB — I-STAT BETA HCG BLOOD, ED (MC, WL, AP ONLY): I-stat hCG, quantitative: <5 m[IU]/mL (ref <5)

## 2019-02-20 LAB — CBG MONITORING, ED: Glucose-Capillary: 474 mg/dL — ABNORMAL HIGH (ref 70–99)

## 2019-02-20 MED ORDER — SODIUM CHLORIDE 0.9 % IV BOLUS: 1000.0000 mL | Freq: Once | INTRAVENOUS | Status: DC

## 2019-02-20 NOTE — ED Provider Notes (Signed)
[facility] DEPT Provider Note   CSN: [id number] Arrival date & time: 02/20/19  1827    History   Chief Complaint Chief Complaint  Patient presents with  . Hyperglycemia    HPI Sandra Bishop is a 48 y.o. female presenting to the emergency department with complaint of hyperglycemia.  She states over the last 2 weeks she has had polyuria and polydipsia.  Her friend has a glucometer and has diabetes, and checked her blood sugar today and noted it was over 500.  She called her PCP who recommended she come to the ED for further evaluation.  She states overall she feels well.  She has no known history of diabetes and is followed by her PCP.  Her mother and grandmother do have history of type 2 diabetes.  She states she thinks today her meal of [employer], a large fruit drink and then 3 large serum missed sodas were the cause of her elevated blood sugar.      The history is provided by the patient.    Past Medical History:  Diagnosis Date  . Allergy   . Anemia   . Constipation   . Obesity     Patient Active Problem List   Diagnosis Date Noted  . Vaginal discharge 01/08/2019  . Bacterial vaginosis 01/08/2019  . Anemia   . Obesity   . Pancreatitis 09/02/2011  . Constipation 08/30/2011    Past Surgical History:  Procedure Laterality Date  . EYE SURGERY    . TUBAL LIGATION       OB History   No obstetric history on file.      Home Medications    Prior to Admission medications   Medication Sig Start Date End Date Taking? Authorizing Provider  aspirin 81 MG chewable tablet Chew 81 mg by mouth daily.   Yes [provider]  ST JOHNS WORT PO Take 2 capsules by mouth daily.   Yes [provider]  TURMERIC PO Take 1 tablet by mouth at bedtime.   Yes [provider]  clindamycin (CLEOCIN) 2 % vaginal cream Place 1 Applicatorful vaginally at bedtime. Patient not taking: Reported on 02/20/2019 01/09/19   Maryruth Hancock,  MD  clotrimazole-betamethasone (LOTRISONE) cream Apply 1 application topically 2 (two) times daily. To external vaginal area Patient not taking: Reported on 02/20/2019 12/16/18   Forrest Moron, MD  fluconazole (DIFLUCAN) 150 MG tablet Take one po today and one po in one week Patient not taking: Reported on 02/20/2019 01/10/19   Maryruth Hancock, MD  metFORMIN (GLUCOPHAGE) 500 MG tablet Take 1 tablet (500 mg total) by mouth 2 (two) times daily with a meal. 02/21/19   Rutger Salton, [place] N, PA-C    Family History Family History  Problem Relation Age of Onset  . Hypertension Mother   . Pancreatic cancer Father 51  . Heart failure Maternal Grandmother     Social History Social History   Tobacco Use  . Smoking status: Former Smoker    Types: Cigarettes    Quit date: 12/28/2010    Years since quitting: 8.1  . Smokeless tobacco: Never Used  . Tobacco comment: quit in 2012, previously smoked 15 years  Substance Use Topics  . Alcohol use: Yes    Alcohol/week: 0.0 standard drinks    Comment: occ  . Drug use: No     Allergies   Patient has no known allergies.   Review of Systems Review of Systems  Endocrine: Positive for  polydipsia and polyuria.  All other systems reviewed and are negative.    Physical Exam Updated Vital Signs BP 126/87 (BP Location: Right Arm)   Pulse 93   Temp 98.9 F (37.2 C) (Oral)   Resp 15   SpO2 98%   Physical Exam Vitals signs and nursing note reviewed.  Constitutional:      General: She is not in acute distress.    Appearance: She is well-developed. She is obese. She is not ill-appearing.  HENT:     Head: Normocephalic and atraumatic.  Eyes:     Conjunctiva/sclera: Conjunctivae normal.  Cardiovascular:     Rate and Rhythm: Normal rate and regular rhythm.  Pulmonary:     Effort: Pulmonary effort is normal. No respiratory distress.     Breath sounds: Normal breath sounds.  Abdominal:     General: Bowel sounds are normal.     Palpations: Abdomen is  soft.     Tenderness: There is no abdominal tenderness. There is no guarding or rebound.  Skin:    General: Skin is warm.  Neurological:     Mental Status: She is alert.  Psychiatric:        Behavior: Behavior normal.      ED Treatments / Results  Labs (all labs ordered are listed, but only abnormal results are displayed) Labs Reviewed  BASIC METABOLIC PANEL - Abnormal; Notable for the following components:      Result Value   Sodium 134 (*)    CO2 21 (*)    Glucose, Bld 351 (*)    All other components within normal limits  URINALYSIS, ROUTINE W REFLEX MICROSCOPIC - Abnormal; Notable for the following components:   Color, Urine STRAW (*)    Specific Gravity, Urine 1.032 (*)    Glucose, UA >=500 (*)    All other components within normal limits  CBG MONITORING, ED - Abnormal; Notable for the following components:   Glucose-Capillary 474 (*)    All other components within normal limits  CBC  CBG MONITORING, ED  I-STAT BETA HCG BLOOD, ED ([facility], [facility], [facility] ONLY)    EKG None  Radiology No results found.  Procedures Procedures (including critical care time)  Medications Ordered in ED Medications  metFORMIN (GLUCOPHAGE) tablet 500 mg (500 mg Oral Given 02/21/19 0023)     Initial Impression / Assessment and Plan / ED Course  I have reviewed the triage vital signs and the nursing notes.  Pertinent labs & imaging results that were available during my care of the patient were reviewed by me and considered in my medical decision making (see chart for details).        Patient presenting with hyperglycemia, no known history of diabetes though does have family history.  She is well-appearing in no distress without complaints on evaluation.  Her labs reveal a normal anion gap.  Her urine has large glucose, no ketones.  Her initial CBG is 474, however her BMP reveals a glucose of 351.  No signs of DKA. Pt offered liter of IVF but would prefer PO hydration. I feel this is reasonable.  Recommend initiating metformin and close PCP follow up. She states she has plan to call her PCP in the morning, and they are expecting her call to follow up on this ED visit. Discussed diet modifications and provided some education. Pt is agreeable to plan and is safe for discharge.  Pt discussed with Dr. Roslynn Amble, who agrees with workup and care plan.  Discussed results, findings,  treatment and follow up. Patient advised of return precautions. Patient verbalized understanding and agreed with plan.   Final Clinical Impressions(s) / ED Diagnoses   Final diagnoses:  Hyperglycemia    ED Discharge Orders         Ordered    metFORMIN (GLUCOPHAGE) 500 MG tablet  2 times daily with meals     02/21/19 0012           Jacen Carlini, [place] N, PA-C 02/21/19 0024    Lucrezia Starch, MD 02/21/19 1202

## 2019-02-20 NOTE — ED Notes (Signed)
Pt changed into a hospital gown.

## 2019-02-20 NOTE — ED Notes (Signed)
Pt given ice water.

## 2019-02-20 NOTE — Telephone Encounter (Signed)
Call from answering service at approximately 5:30 PM.  Patient has had increased thirst, urination, nausea few days ago but no nausea today.  She has been trying to drink more fluids.  Friend who is diabetic checked her blood sugar and it was 528.  Disposition from team health was ER evaluation which I agree with.  Can follow-up in our office after evaluated through the ER as we will need lab work including possible bicarb to make sure outpatient treatment appropriate.

## 2019-02-20 NOTE — ED Triage Notes (Addendum)
Patient reports polydipsia and polyuria x3 weeks. Used neighbors glucometer and CBG 528. Denies N/V/D. Reports no diabetes hx

## 2019-02-20 NOTE — ED Notes (Signed)
Pt ambulatory to BR with steady gait.

## 2019-02-21 MED ORDER — METFORMIN HCL 500 MG PO TABS
500.0000 mg | ORAL_TABLET | Freq: Once | ORAL | Status: AC
  Administered 2019-02-21: 500 mg via ORAL
  Filled 2019-02-21: qty 1

## 2019-02-21 MED ORDER — METFORMIN HCL 500 MG PO TABS: 500.0000 mg | ORAL_TABLET | Freq: Two times a day (BID) | ORAL | 0 refills | Status: DC

## 2019-02-21 NOTE — ED Notes (Signed)
Patient was verbalized discharge instructions. Pt had no further questions at this time. NAD. 

## 2019-02-21 NOTE — Discharge Instructions (Addendum)
Follow-up closely with your primary care provider regarding your visit today.  You had high blood sugar we are concerned for type 2 diabetes. Drink plenty of water, and limit your simple carb and sugar intake. Return the emergency department for any new or concerning symptoms.

## 2019-03-05 ENCOUNTER — Encounter: Payer: Self-pay | Admitting: Family Medicine

## 2019-03-05 ENCOUNTER — Ambulatory Visit (INDEPENDENT_AMBULATORY_CARE_PROVIDER_SITE_OTHER): Payer: BC Managed Care – PPO | Admitting: Family Medicine

## 2019-03-05 ENCOUNTER — Other Ambulatory Visit: Payer: Self-pay

## 2019-03-05 VITALS — BP 138/99 | HR 89 | Temp 97.8°F | Resp 17 | Ht 62.6 in | Wt 224.8 lb

## 2019-03-05 DIAGNOSIS — E119 Type 2 diabetes mellitus without complications: Secondary | ICD-10-CM | POA: Diagnosis not present

## 2019-03-05 LAB — POCT CBC
Granulocyte percent: 4.6 %G — AB (ref 37–80)
HCT, POC: 35.2 % (ref 29–41)
Hemoglobin: 11.4 g/dL (ref 11–14.6)
Lymph, poc: 36.9 — AB (ref 0.6–3.4)
MCH, POC: 26.4 pg — AB (ref 27–31.2)
MCHC: 32.3 g/dL (ref 31.8–35.4)
MCV: 81.8 fL (ref 76–111)
MID (cbc): 60.4 — AB (ref 0–0.9)
MPV: 7.4 fL (ref 0–99.8)
POC Granulocyte: 0.2 — AB (ref 2–6.9)
POC LYMPH PERCENT: 2.7 %L — AB (ref 10–50)
POC MID %: 2.8 %M (ref 0–12)
Platelet Count, POC: 315 10*3/uL (ref 142–424)
RBC: 4.3 M/uL (ref 4.04–5.48)
RDW, POC: 14.5 %
WBC: 7.6 10*3/uL (ref 4.6–10.2)

## 2019-03-05 LAB — GLUCOSE, POCT (MANUAL RESULT ENTRY): POC Glucose: 145 mg/dl — AB (ref 70–99)

## 2019-03-05 LAB — POCT GLYCOSYLATED HEMOGLOBIN (HGB A1C): Hemoglobin A1C: 12.2 % — AB (ref 4.0–5.6)

## 2019-03-05 MED ORDER — ONETOUCH VERIO VI STRP: ORAL_STRIP | 12 refills | Status: DC

## 2019-03-05 MED ORDER — ONETOUCH ULTRASOFT LANCETS MISC: 12 refills | Status: AC

## 2019-03-05 NOTE — Patient Instructions (Addendum)
Low Glycemic Foods (20-49)  Breakfast Cereals: All-Bran                All-Bran Fruit ' n Oats Fiber One               Oatmeal (not instant)  Oat bran  Fruits and fruit juices: (Limit to 1-2 servings per day) Apples               Apricots (fresh & dried)  Blackberries            Blueberries Cherries                  Cranberries             Peaches                  Pears                       Plums                       Prunes Raspberries            Strawberries           Tangerine Apple juice              Tomato juice  Beans and legumes (fresh-cooked): Black-eyed peas     Butter beans Chick peas              Lentils     Green beans           Lima beans               Kidney beans          Navy beans  Non-starchy vegetables: Asparagus, bok choy, broccoli, cabbage, cauliflower, celery, cucumber, greens, lettuce, mushrooms, peppers, tomatoes, okra, onions, snow peas, spinach, summer squash  Grains: Barley                                Bulgur Rye                                    Wild rice  Nuts and oils : Almonds         Peanuts     Sunflower seeds  Hazelnuts      Pecans          Walnuts Oils that are liquid at room temperature  Dairy, fish, and meat: Milk, skim                         Lowfat cheese Yogurt, lowfat, fruit sugar sweetened Lean red meat                      Fish  Skinless chicken & [place]    Shellfish Moderate Glycemic Foods (50-69)  Breakfast Cereals: Bran Buds                             Bran Chex Just Right                            Mini-Wheats Special K         Swiss muesli  Fruits: Banana (under-ripe)             Dates Figs                                      Grapes Kiwi                                      Mango Oranges                               Raisins  Fruit Juices: Cranberry juice                    Orange juice  Beans and legumes: [place]-type baked beans Canned pinto, kidney, or navy beans Green  peas  Vegetables: Beets                          Raw Carrots  Sweet potato               Yam Corn on the cob  Breads: Pita (pocket) bread          Oat bran bread Pumpernickel bread           Rye bread Wheat bread, high fiber       Grains: Cornmeal                           Rice, brown   Rice, white                         Couscous Pasta: Macaroni                           Pizza  cheese Raviolimeat filled           Spaghetti, white        Nuts: Cashews                           Macadamia  Snacks: Chocolate                    Ice cream,lowfat  Muffin                               Popcorn High Glycemic Foods (70-100)   Breakfast Cereals: Cheerios                 Corn Chex Corn Flakes            Cream of Wheat Grape Nuts              Grape Nut Flakes Life                 Nutri-Grain       Puffed Rice               Puffed Wheat Rice Chex                 Rice Krispies Shredded Wheat             Team Total  Fruits: Pineapple  Watermelon Banana (over-ripe) Mango Grapes  Beverages: Sodas, sweet tea, pineapple juice  Vegetables: Potato, baked, boiled, fried, mashed [place] fries Canned or frozen corn Cooked carrots Parsnips Winter squash  Breads: Most breads (white and whole grain) Bagels                     Bread sticks Bread stuffing          Kaiser roll Dinner rolls  Grains: Rice, instant          Tapioca, with milk  Candy and most cookies Snacks: Donuts                      Corn chips        Jelly beans                 Pretzels Pastries                             Restaurant and ethnic foods Most [place] food (sugar in stir fry or wok sauces) Teriyaki-style meats and vegetables       If you have lab work done today you will be contacted with your lab results within the next 2 weeks.  If you have not heard from [place] then please contact us. The fastest way to get your results is to register for My Chart.   IF you received an  x-ray today, you will receive an invoice from [facility]. Please contact [facility] at [phone] with questions or concerns regarding your invoice.   IF you received labwork today, you will receive an invoice from [place]. Please contact [facility] at [phone] with questions or concerns regarding your invoice.   Our billing staff will not be able to assist you with questions regarding bills from these companies.  You will be contacted with the lab results as soon as they are available. The fastest way to get your results is to activate your My Chart account. Instructions are located on the last page of this paperwork. If you have not heard from [place] regarding the results in 2 weeks, please contact this office.     Diabetes Action Plan Following a diabetes action plan is a way for you to manage your diabetes (diabetes mellitus) symptoms. The plan is color-coded to help you understand what actions you need to take based on any symptoms you are having.  If you have symptoms in the red zone, you need medical care right away.  If you have symptoms in the yellow zone, it means you are having problems.  If you have symptoms in the green zone, you are doing well. Learning about and understanding diabetes can take time. Follow the plan that you develop with your health care provider. Know the target range for your blood sugar (glucose) level, and review your treatment plan with your health care provider at each visit. The target range for my blood sugar level is __________________________ mg/dL. Red zone Get medical help right away if you have any of the following symptoms:  A blood sugar test result that is below 54 mg/dL (3 mmol/L).  A blood sugar test result that is at or above 240 mg/dL (13.3 mmol/L) for 2 days in a row.  Confusion or trouble thinking clearly.  Difficulty breathing.  Sickness or a fever for 2 or more days that is not getting better.  Moderate or  large ketone levels in your  urine. If you have any red zone symptoms, call emergency services (911 in the [place] or go to the nearest emergency room. If you have severely low blood sugar (severe hypoglycemia) and you cannot eat or drink, you may need an injection of glucagon. Make sure a family member or close friend knows how to check your blood sugar and how to give you a glucagon injection. You may need to be treated in a hospital for this condition. Yellow zone If you have any of the following symptoms, your diabetes is not under control and you may need to make some changes:  Blood sugar test results that are below 70 mg/dL (3.9 mmol/L).  Other symptoms of hypoglycemia, such as: ? Shaking or feeling light-headed. ? Confusion or irritability. ? Feeling hungry. ? Having a fast heartbeat.  A blood sugar test result that is higher than 240 mg/dL (13.3 mmol/L) for 2 days in a row.  A fever.  Feeling tired, or not having any energy. If you have any yellow zone symptoms:  Treat your low blood sugar (hypoglycemia) by eating or drinking 15 grams of a rapid-acting carbohydrate. Follow the 15:15 rule: ? Take 15 grams of a rapid-acting carbohydrate, such as: ? 1 tube of glucose gel. ? 3 glucose pills. ? 6-8 pieces of hard candy. ? 4 oz (120 mL) of fruit juice. ? 4 oz (120 mL) of regular (not diet) soda. ? Check your blood sugar 15 minutes after you take the carbohydrate. ? If the repeat blood sugar test is still at or below 70 mg/dL (3.9 mmol/L), take 15 grams of a carbohydrate again. ? If your blood sugar does not increase above 70 mg/dL (3.9 mmol/L) after 3 tries, get medical help right away. ? After your blood sugar returns to normal, eat a meal or a snack within 1 hour.  Keep taking your daily medicines as directed.  Check your blood sugar more often than you normally would. ? Write down your results. ? Call your health care provider if you have trouble keeping your blood sugar in  your target range.  Green zone These signs mean you are doing well and you can continue what you are doing to manage your diabetes:  Your blood sugar is within your personal target range. For most people, a blood sugar level before a meal (preprandial) should be 80-130 mg/dL.  You feel well, and you are able to do daily activities. If you are in the green zone, continue to manage your diabetes as directed. To do this:  Eat a healthy diet.  Exercise regularly.  Check your blood sugar as directed.  Take your medicines as directed.  Where to find more information You can find more information about diabetes from:  American Diabetes Association (ADA): www.diabetes.org  American Association of Diabetes Educators (AADE): www.diabeteseducator.org Summary  Following a diabetes action plan is a way for you to manage your diabetes symptoms. The plan is color-coded to help you understand what actions you need to take based on any symptoms you are having.  Follow the plan that you develop with your health care provider. Make sure you know your personal target blood sugar level.  Review your treatment plan with your health care provider at each visit. This information is not intended to replace advice given to you by your health care provider. Make sure you discuss any questions you have with your health care provider. Document Released: 04/25/2017 Document Revised: 08/31/2017 Document Reviewed: 04/25/2017 Elsevier Patient Education  2020 Elsevier  Inc.  

## 2019-03-05 NOTE — Progress Notes (Signed)
Established Patient Office Visit  Subjective:  Patient ID: Sandra Bishop, female    DOB: 1970-10-16  Age: 48 y.o. MRN: [id number]  CC:  Chief Complaint  Patient presents with  . ER visit for diabetes    side effect of medication are jitteriness, vision is off (unable to focus-can't drive), diarrhea x 1 week after starting, sluggishness  . Anemia    HPI ALAYAH KNOUFF presents for   Diabetes Mellitus Type 2 : New Discussion Lab Results  Component Value Date   HGBA1C 12.2 (A) 03/05/2019   Patient reports that she checked her blood glucose with her neighbors machine and her sugar was very high. She went to the ER.  In the ER her sugar was 528.   She is here to get a complete evaluation for diabetes new onset.    Most recent labs: HbA1c:  Lab Results  Component Value Date   HGBA1C 6.1 09/29/2015   HGBA1C 5.9 08/30/2011   Lipid:  No components found for: CHOLTOTAL Lab Results  Component Value Date   HDL 53 09/29/2015   HDL 45 08/30/2011   Lab Results  Component Value Date   LDLCALC 48 09/29/2015   LDLCALC 44 08/30/2011   Lab Results  Component Value Date   TRIG 51 09/29/2015   TRIG 46 08/30/2011   Last GFR:No results found for: GFR Creatinine:  Lab Results  Component Value Date   CREATININE 0.80 02/20/2019   Urine microalbumin: No components found for: MALBDUAP The ASCVD Risk score Mikey Bussing DC Jr., et al., 2013) failed to calculate for the following reasons:   Cannot find a previous HDL lab   Cannot find a previous total cholesterol lab  DM Health Maintenance:  Dilated eye exam date: Needs and will order Monofilament exam date: Needs and will perform, Flu shot: Refuses despite counseling Statin: No Aspirin: Yes  Tobacco: no   Social History   Tobacco Use  Smoking Status Former Smoker  . Types: Cigarettes  . Quit date: 12/28/2010  . Years since quitting: 8.1  Smokeless Tobacco Never Used  Tobacco Comment   quit in 2012, previously smoked 15  years     Past Medical History:  Diagnosis Date  . Allergy   . Anemia   . Constipation   . Obesity     Past Surgical History:  Procedure Laterality Date  . EYE SURGERY    . TUBAL LIGATION      Family History  Problem Relation Age of Onset  . Hypertension Mother   . Pancreatic cancer Father 25  . Heart failure Maternal Grandmother     Social History   Socioeconomic History  . Marital status: Divorced    Spouse name: Not on file  . Number of children: 2  . Years of education: Not on file  . Highest education level: Not on file  Occupational History  . Not on file  Social Needs  . Financial resource strain: Not on file  . Food insecurity    Worry: Not on file    Inability: Not on file  . Transportation needs    Medical: Not on file    Non-medical: Not on file  Tobacco Use  . Smoking status: Former Smoker    Types: Cigarettes    Quit date: 12/28/2010    Years since quitting: 8.1  . Smokeless tobacco: Never Used  . Tobacco comment: quit in 2012, previously smoked 15 years  Substance and Sexual Activity  . Alcohol use: Yes  Alcohol/week: 0.0 standard drinks    Comment: occ  . Drug use: No  . Sexual activity: Yes  Lifestyle  . Physical activity    Days per week: Not on file    Minutes per session: Not on file  . Stress: Not on file  Relationships  . Social Herbalist on phone: Not on file    Gets together: Not on file    Attends religious service: Not on file    Active member of club or organization: Not on file    Attends meetings of clubs or organizations: Not on file    Relationship status: Not on file  . Intimate partner violence    Fear of current or ex partner: Not on file    Emotionally abused: Not on file    Physically abused: Not on file    Forced sexual activity: Not on file  Other Topics Concern  . Not on file  Social History Narrative  . Not on file    Outpatient Medications Prior to Visit  Medication Sig Dispense Refill   . metFORMIN (GLUCOPHAGE) 500 MG tablet Take 1 tablet (500 mg total) by mouth 2 (two) times daily with a meal. 30 tablet 0  . ST JOHNS WORT PO Take 2 capsules by mouth daily.    . TURMERIC PO Take 1 tablet by mouth at bedtime.    Marland Kitchen aspirin 81 MG chewable tablet Chew 81 mg by mouth daily.    . clindamycin (CLEOCIN) 2 % vaginal cream Place 1 Applicatorful vaginally at bedtime. (Patient not taking: Reported on 02/20/2019) 40 g 0  . clotrimazole-betamethasone (LOTRISONE) cream Apply 1 application topically 2 (two) times daily. To external vaginal area (Patient not taking: Reported on 02/20/2019) 30 g 0  . fluconazole (DIFLUCAN) 150 MG tablet Take one po today and one po in one week (Patient not taking: Reported on 02/20/2019) 2 tablet 0   No facility-administered medications prior to visit.     No Known Allergies  ROS Review of Systems Review of Systems  Constitutional: Negative for activity change, appetite change, chills and fever.  HENT: Negative for congestion, nosebleeds, trouble swallowing and voice change.   Respiratory: Negative for cough, shortness of breath and wheezing.   Gastrointestinal: Negative for diarrhea, nausea and vomiting.  Genitourinary: Negative for difficulty urinating, dysuria, flank pain and hematuria.  Musculoskeletal: Negative for back pain, joint swelling and neck pain.  Neurological: Negative for dizziness, speech difficulty, light-headedness and numbness.  See HPI. All other review of systems negative.     Objective:    Physical Exam  BP (!) 138/99 (BP Location: Right Arm, Patient Position: Sitting, Cuff Size: Large)   Pulse 89   Temp 97.8 F (36.6 C) (Oral)   Resp 17   Ht 5' 2.6" (1.59 m)   Wt 224 lb 12.8 oz (102 kg)   LMP 02/19/2019   SpO2 99%   BMI 40.33 kg/m  Wt Readings from Last 3 Encounters:  03/05/19 224 lb 12.8 oz (102 kg)  01/08/19 229 lb 3.2 oz (104 kg)  12/16/18 230 lb (104.3 kg)     Health Maintenance Due  Topic Date Due  . HIV  Screening  05/15/1986  . INFLUENZA VACCINE  02/15/2019    There are no preventive care reminders to display for this patient.  Lab Results  Component Value Date   TSH 0.56 09/29/2015   Lab Results  Component Value Date   WBC 10.1 02/20/2019   HGB 12.4  02/20/2019   HCT 39.3 02/20/2019   MCV 83.3 02/20/2019   PLT 308 02/20/2019   Lab Results  Component Value Date   NA 134 (L) 02/20/2019   K 4.1 02/20/2019   CO2 21 (L) 02/20/2019   GLUCOSE 351 (H) 02/20/2019   BUN 13 02/20/2019   CREATININE 0.80 02/20/2019   BILITOT 0.3 09/29/2015   ALKPHOS 61 09/29/2015   AST 12 09/29/2015   ALT 13 09/29/2015   PROT 7.7 09/29/2015   ALBUMIN 4.1 09/29/2015   CALCIUM 9.3 02/20/2019   ANIONGAP 10 02/20/2019   Lab Results  Component Value Date   CHOL 111 (L) 09/29/2015   Lab Results  Component Value Date   HDL 53 09/29/2015   Lab Results  Component Value Date   LDLCALC 48 09/29/2015   Lab Results  Component Value Date   TRIG 51 09/29/2015   Lab Results  Component Value Date   CHOLHDL 2.1 09/29/2015   Lab Results  Component Value Date   HGBA1C 6.1 09/29/2015      Assessment & Plan:   Problem List Items Addressed This Visit    None    Visit Diagnoses    Newly diagnosed diabetes ([place])    -  Primary   Relevant Orders   POCT glycosylated hemoglobin (Hb A1C)   POCT glucose (manual entry)   POCT CBC   Lipid panel   CMP14+EGFR   TSH   Ambulatory referral to Ophthalmology   HM DIABETES FOOT EXAM (Completed)      Discussed standard of care and also discussed screening for other modifiable risk factors Discussed statin, eye exam, exercise, Diabetic diet Time spent on nutritional counseling: 10 minutes  No orders of the defined types were placed in this encounter.  A total of 25 minutes were spent face-to-face with the patient during this encounter and over half of that time was spent on counseling and coordination of care.   Follow-up: No follow-ups on file.     Forrest Moron, MD

## 2019-03-06 ENCOUNTER — Other Ambulatory Visit: Payer: Self-pay | Admitting: Family Medicine

## 2019-03-06 LAB — CMP14+EGFR
ALT: 25 IU/L (ref 0–32)
AST: 17 IU/L (ref 0–40)
Albumin/Globulin Ratio: 1.3 (ref 1.2–2.2)
Albumin: 4.1 g/dL (ref 3.8–4.8)
Alkaline Phosphatase: 70 IU/L (ref 39–117)
BUN/Creatinine Ratio: 13 (ref 9–23)
BUN: 9 mg/dL (ref 6–24)
Bilirubin Total: <0.2 mg/dL (ref 0.0–1.2)
CO2: 23 mmol/L (ref 20–29)
Calcium: 9.6 mg/dL (ref 8.7–10.2)
Chloride: 102 mmol/L (ref 96–106)
Creatinine, Ser: 0.69 mg/dL (ref 0.57–1.00)
GFR calc Af Amer: 120 mL/min/{1.73_m2} (ref >59)
GFR calc non Af Amer: 104 mL/min/{1.73_m2} (ref >59)
Globulin, Total: 3.1 g/dL (ref 1.5–4.5)
Glucose: 126 mg/dL — ABNORMAL HIGH (ref 65–99)
Potassium: 4.2 mmol/L (ref 3.5–5.2)
Sodium: 139 mmol/L (ref 134–144)
Total Protein: 7.2 g/dL (ref 6.0–8.5)

## 2019-03-06 LAB — LIPID PANEL
Chol/HDL Ratio: 2.3 ratio (ref 0.0–4.4)
Cholesterol, Total: 118 mg/dL (ref 100–199)
HDL: 51 mg/dL (ref >39)
LDL Calculated: 47 mg/dL (ref 0–99)
Triglycerides: 98 mg/dL (ref 0–149)
VLDL Cholesterol Cal: 20 mg/dL (ref 5–40)

## 2019-03-06 LAB — TSH: TSH: 0.612 u[IU]/mL (ref 0.450–4.500)

## 2019-03-06 NOTE — Telephone Encounter (Signed)
Medication: metFORMIN (GLUCOPHAGE) 500 MG tablet [[id number]]    Pharmacy:  [facility], [place] [address] AT [place] [phone] (Phone) [phone] (Fax)   Pt states that she would like a 30 day supply. Pt states that she only has one day left.

## 2019-03-06 NOTE — Telephone Encounter (Signed)
Lov-03/05/2019  Medication was lasted filled on 02/21/2019 by the ED for 30 tabs.  Requested Prescriptions  Pending Prescriptions Disp Refills  . metFORMIN (GLUCOPHAGE) 500 MG tablet [Pharmacy Med Name: METFORMIN 500MG TABLETS] 30 tablet 0    Sig: TAKE 1 TABLET BY MOUTH TWICE DAILY     Endocrinology:  Diabetes - Biguanides Failed - 03/06/2019 12:57 PM      Failed - HBA1C is between 0 and 7.9 and within 180 days    Hemoglobin A1C  Date Value Ref Range Status  03/05/2019 12.2 (A) 4.0 - 5.6 % Final         Passed - Cr in normal range and within 360 days    Creat  Date Value Ref Range Status  09/29/2015 0.78 0.50 - 1.10 mg/dL Final   Creatinine, Ser  Date Value Ref Range Status  03/05/2019 0.69 0.57 - 1.00 mg/dL Final         Passed - eGFR in normal range and within 360 days    GFR calc Af Amer  Date Value Ref Range Status  03/05/2019 120 >59 mL/min/1.73 Final   GFR calc non Af Amer  Date Value Ref Range Status  03/05/2019 104 >59 mL/min/1.73 Final         Passed - Valid encounter within last 6 months    Recent Outpatient Visits          Yesterday Newly diagnosed diabetes [facility])   Primary Care at [facility], Arlie Solomons, MD   1 month ago Vaginal discharge   Primary Care at [place], MD   2 months ago Vaginitis and vulvovaginitis   Primary Care at [facility], Arlie Solomons, MD   3 years ago Precordial pain   Primary Care at [facility], Fenton Malling, MD   7 years ago Wheezing   Primary Care at [facility], Loura Back, MD      Future Appointments            In 1 month Forrest Moron, MD Primary Care at [place], [facility]

## 2019-03-11 LAB — HM DIABETES EYE EXAM

## 2019-03-19 ENCOUNTER — Other Ambulatory Visit: Payer: Self-pay | Admitting: Family Medicine

## 2019-03-19 NOTE — Telephone Encounter (Signed)
Forwarding medication refill request to the clinical pool for review. 

## 2019-03-20 ENCOUNTER — Telehealth: Payer: Self-pay | Admitting: Family Medicine

## 2019-03-20 NOTE — Telephone Encounter (Signed)
Copied from [place] [id number]. Topic: General - Other >> Mar 20, 2019  9:56 AM Mcneil, Ja-Kwan wrote: Reason for CRM: Pt stated she lost her glucose monitor somehow and she would like to know if the office could provide her with a One Touch Verio monitor or with a Rx so she can get another monitor. Pt requests call back.

## 2019-03-21 ENCOUNTER — Other Ambulatory Visit: Payer: Self-pay

## 2019-03-21 DIAGNOSIS — E119 Type 2 diabetes mellitus without complications: Secondary | ICD-10-CM

## 2019-03-21 MED ORDER — BLOOD GLUCOSE METER KIT: PACK | 0 refills | Status: AC

## 2019-03-21 NOTE — Telephone Encounter (Signed)
Rx was sent to pharmacy. 

## 2019-03-26 ENCOUNTER — Encounter: Payer: Self-pay | Admitting: Family Medicine

## 2019-03-31 ENCOUNTER — Encounter: Payer: Self-pay | Admitting: Radiology

## 2019-04-06 ENCOUNTER — Other Ambulatory Visit: Payer: Self-pay | Admitting: Family Medicine

## 2019-04-07 ENCOUNTER — Telehealth: Payer: Self-pay | Admitting: Family Medicine

## 2019-04-07 NOTE — Telephone Encounter (Signed)
Called pt an she advises she was suppose to be referred to cardiology for heart palpitations.  Per pt hx of heart palpitations as she take an aspirin daily for but at night her heart feels like it stops and starts again. Pt is concerned advise I would let Dr Nolon Rod know and call her back with her response.  Pt agreeable. Dgaddy, CMA

## 2019-04-07 NOTE — Telephone Encounter (Signed)
Pt is under the impression she was getting a referral to see a cardiologist. I do not see one in the works. Please advise pt at [phone]

## 2019-04-08 ENCOUNTER — Other Ambulatory Visit: Payer: Self-pay

## 2019-04-08 ENCOUNTER — Encounter: Payer: Self-pay | Admitting: Family Medicine

## 2019-04-08 ENCOUNTER — Ambulatory Visit (INDEPENDENT_AMBULATORY_CARE_PROVIDER_SITE_OTHER): Payer: BC Managed Care – PPO | Admitting: Family Medicine

## 2019-04-08 VITALS — BP 144/90 | HR 99 | Temp 98.6°F | Resp 17 | Ht 62.6 in | Wt 223.6 lb

## 2019-04-08 DIAGNOSIS — E119 Type 2 diabetes mellitus without complications: Secondary | ICD-10-CM | POA: Diagnosis not present

## 2019-04-08 DIAGNOSIS — R002 Palpitations: Secondary | ICD-10-CM | POA: Diagnosis not present

## 2019-04-08 NOTE — Patient Instructions (Signed)
Referral is being made to cardiology.  Someone should contact you within the next few days regarding that.  If by early next week you have not heard anything about it please call back and asked to speak to referrals.  Decrease the evening dose of metformin to 250 mg (one half 500 mg)  Keep your scheduled diabetic follow-up with Dr. Nolon Rod.  If worse chest symptoms come back or go to the emergency room if having severe chest pain or palpitations it because symptoms like lightheadedness or dizziness or fainting.

## 2019-04-08 NOTE — Progress Notes (Addendum)
Patient ID: KA GAYDON, female    DOB: 05/21/1971  Age: 48 y.o. MRN: [id number]  Chief Complaint  Patient presents with  . Palpitations    seen here 4-5 yrs ago thought heart attack sent to ED dx palpitations, saw cardiologist dx with heart palpitations.  Per pt at night feels like her heart stops and starts again, ? medication(metformin) as it only happens at night after taking med.  Pt concerned if she is being paranoid or if she is having some changes within her heart  . Immunizations    declines flu and pneumo vaccine    Subjective:   48 year old lady who is here with history of having been fairly recently diagnosed with diabetes and begun on metformin.  Since that time she has been having palpitations at nighttime feeling a jumping in her chest.  She gets up and checks her sugars and it often is in the neighborhood of 100, none below 80.  She usually has good morning sugars also.  She is taking metformin 500 mg twice a day and attributes her symptoms to being on the metformin.  She has been watching her diet.  He was newly diagnosis diabetic and had a very high A1c.  She was seen here 48 and half years ago by me with palpitation problems and chest symptoms.  She was referred to cardiology who did a Holter monitor at that time which showed some PVCs.  She also had a stress test done then.  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    Current allergies, medications, problem list, past/family and social histories reviewed.  Objective:  BP (!) 144/90 (BP Location: Left Arm, Patient Position: Sitting, Cuff Size: Large)   Pulse 99   Temp 98.6 F (37 C) (Oral)   Resp 17   Ht 5' 2.6" (1.59 m)   Wt 223 lb 9.6 oz (101.4 kg)   LMP 03/25/2019   SpO2 96%   BMI 40.12 kg/m   No major acute distress.  Chest clear.  Heart regular without murmurs.  EKG and rhythm strip are entirely normal. EKG and rhythm strip  normal. Assessment & Plan:   Assessment: 1. Palpitations   2. Newly diagnosed diabetes ([place])       Plan: Will refer on to cardiology.  I think she needs a Holter to be certain what she is doing at night.  We will decrease her metformin dose in the evening even though it is unlikely to cause hypoglycemia, she is running low enough that I think we can do that.  See instructions  Orders Placed This Encounter  Procedures  . Ambulatory referral to Cardiology    Referral Priority:   Routine    Referral Type:   Consultation    Referral Reason:   Specialty Services Required    Referred to Provider:   Nahser, Wonda Cheng, MD    Requested Specialty:   Cardiology    Number of Visits Requested:   1  . EKG 12-Lead    No orders of the defined types were placed in this encounter.        Patient Instructions  Referral is being made to cardiology.  Someone should contact you within the next few days regarding that.  If by early next week you have not heard anything about it please call back and asked to speak to referrals.  Decrease the evening dose of metformin to 250 mg (one  half 500 mg)  Keep your scheduled diabetic follow-up with Dr. Nolon Rod.  If worse chest symptoms come back or go to the emergency room if having severe chest pain or palpitations it because symptoms like lightheadedness or dizziness or fainting.    Return if symptoms worsen or fail to improve.   Ruben Reason, MD 04/08/2019

## 2019-04-09 NOTE — Telephone Encounter (Signed)
Pt seen by hopper on 04/08/2019.

## 2019-04-16 ENCOUNTER — Encounter: Payer: Self-pay | Admitting: Family Medicine

## 2019-04-16 ENCOUNTER — Ambulatory Visit: Payer: BC Managed Care – PPO | Admitting: Family Medicine

## 2019-04-16 ENCOUNTER — Other Ambulatory Visit: Payer: Self-pay

## 2019-04-16 VITALS — BP 138/90 | HR 92 | Temp 98.5°F | Ht 64.0 in | Wt 221.2 lb

## 2019-04-16 DIAGNOSIS — E1165 Type 2 diabetes mellitus with hyperglycemia: Secondary | ICD-10-CM

## 2019-04-16 LAB — POCT GLYCOSYLATED HEMOGLOBIN (HGB A1C): Hemoglobin A1C: 8.6 % — AB (ref 4.0–5.6)

## 2019-04-16 MED ORDER — ROSUVASTATIN CALCIUM 5 MG PO TABS: 5.0000 mg | ORAL_TABLET | ORAL | 3 refills | Status: DC

## 2019-04-16 NOTE — Progress Notes (Signed)
Established Patient Office Visit  Subjective:  Patient ID: Sandra Bishop, female    DOB: [birth year]  Age: 48 y.o. MRN: [id number]  CC:  Chief Complaint  Patient presents with   Diabetes    F/u     HPI Sandra Bishop presents for   Patient reports that she has noticed her sugars have been in the 90s to 100s.  She states that her sugars are 160.  She states that she notices that she is getting lower sugars and so her metformin was been decreased by Dr. Linna Darner She states that she is also noticing hair thinning.  She is taking the metformin but hopes to be diet controlled ultimately Lab Results  Component Value Date   HGBA1C 8.6 (A) 04/16/2019   Component     Latest Ref Rng & Units 03/05/2019 04/16/2019  Hemoglobin A1C     4.0 - 5.6 % 12.2 (A) 8.6 (A)   Wt Readings from Last 3 Encounters:  04/16/19 221 lb 3.2 oz (100.3 kg)  04/08/19 223 lb 9.6 oz (101.4 kg)  03/05/19 224 lb 12.8 oz (102 kg)     Past Medical History:  Diagnosis Date   Allergy    Anemia    Constipation    Obesity     Past Surgical History:  Procedure Laterality Date   EYE SURGERY     TUBAL LIGATION      Family History  Problem Relation Age of Onset   Hypertension Mother    Pancreatic cancer Father 34   Heart failure Maternal Grandmother     Social History   Socioeconomic History   Marital status: Divorced    Spouse name: Not on file   Number of children: 2   Years of education: Not on file   Highest education level: Not on file  Occupational History   Not on file  Social Needs   Financial resource strain: Not on file   Food insecurity    Worry: Not on file    Inability: Not on file   Transportation needs    Medical: Not on file    Non-medical: Not on file  Tobacco Use   Smoking status: Former Smoker    Types: Cigarettes    Quit date: 12/28/2010    Years since quitting: 8.3   Smokeless tobacco: Never Used   Tobacco comment: quit in 2012, previously  smoked 15 years  Substance and Sexual Activity   Alcohol use: Yes    Alcohol/week: 0.0 standard drinks    Comment: occ   Drug use: No   Sexual activity: Yes  Lifestyle   Physical activity    Days per week: Not on file    Minutes per session: Not on file   Stress: Not on file  Relationships   Social connections    Talks on phone: Not on file    Gets together: Not on file    Attends religious service: Not on file    Active member of club or organization: Not on file    Attends meetings of clubs or organizations: Not on file    Relationship status: Not on file   Intimate partner violence    Fear of current or ex partner: Not on file    Emotionally abused: Not on file    Physically abused: Not on file    Forced sexual activity: Not on file  Other Topics Concern   Not on file  Social History Narrative   Not on file  Outpatient Medications Prior to Visit  Medication Sig Dispense Refill   aspirin 81 MG chewable tablet Chew 81 mg by mouth daily.     blood glucose meter kit and supplies Dispense based on patient and insurance preference. Use up to four times daily as directed. (FOR ICD-10 E10.9, E11.9).  ( One-Touch Verio meter) 1 each 0   glucose blood (ONETOUCH VERIO) test strip Use to check blood glucose three times a day. Use as instructed. E11.9 100 each 12   Lancets (ONETOUCH ULTRASOFT) lancets Use to check blood glucose three times daily. Use as instructed. E11.9. 100 each 12   metFORMIN (GLUCOPHAGE) 500 MG tablet TAKE 1 TABLET BY MOUTH TWICE DAILY 30 tablet 0   ST JOHNS WORT PO Take 2 capsules by mouth daily.     TURMERIC PO Take 1 tablet by mouth at bedtime.     clindamycin (CLEOCIN) 2 % vaginal cream Place 1 Applicatorful vaginally at bedtime. (Patient not taking: Reported on 02/20/2019) 40 g 0   clotrimazole-betamethasone (LOTRISONE) cream Apply 1 application topically 2 (two) times daily. To external vaginal area (Patient not taking: Reported on  02/20/2019) 30 g 0   fluconazole (DIFLUCAN) 150 MG tablet Take one po today and one po in one week (Patient not taking: Reported on 02/20/2019) 2 tablet 0   No facility-administered medications prior to visit.     No Known Allergies  ROS Review of Systems Review of Systems  Constitutional: Negative for activity change, appetite change, chills and fever.  HENT: Negative for congestion, nosebleeds, trouble swallowing and voice change.   Respiratory: Negative for cough, shortness of breath and wheezing.   Gastrointestinal: Negative for diarrhea, nausea and vomiting.  Genitourinary: Negative for difficulty urinating, dysuria, flank pain and hematuria.  Musculoskeletal: Negative for back pain, joint swelling and neck pain.  Neurological: Negative for dizziness, speech difficulty, light-headedness and numbness.  See HPI. All other review of systems negative.     Objective:    Physical Exam  BP 138/90 (BP Location: Right Arm, Patient Position: Sitting, Cuff Size: Normal)    Pulse 92    Temp 98.5 F (36.9 C) (Oral)    Ht '5\' 4"'  (1.626 m)    Wt 221 lb 3.2 oz (100.3 kg)    LMP 03/25/2019    SpO2 99%    BMI 37.97 kg/m  Wt Readings from Last 3 Encounters:  04/16/19 221 lb 3.2 oz (100.3 kg)  04/08/19 223 lb 9.6 oz (101.4 kg)  03/05/19 224 lb 12.8 oz (102 kg)   Physical Exam  Constitutional: Oriented to person, place, and time. Appears well-developed and well-nourished.  HENT:  Head: Normocephalic and atraumatic.  Eyes: Conjunctivae and EOM are normal.  Cardiovascular: Normal rate, regular rhythm, normal heart sounds and intact distal pulses.  No murmur heard. Pulmonary/Chest: Effort normal and breath sounds normal. No stridor. No respiratory distress. Has no wheezes.  Neurological: Is alert and oriented to person, place, and time.  Skin: Skin is warm. Capillary refill takes less than 2 seconds.  Psychiatric: Has a normal mood and affect. Behavior is normal. Judgment and thought content  normal.    Health Maintenance Due  Topic Date Due   URINE MICROALBUMIN  05/15/1981   HIV Screening  05/15/1986   INFLUENZA VACCINE  02/15/2019    There are no preventive care reminders to display for this patient.  Lab Results  Component Value Date   TSH 0.612 03/05/2019   Lab Results  Component Value Date   WBC  7.6 03/05/2019   HGB 11.4 03/05/2019   HCT 35.2 03/05/2019   MCV 81.8 03/05/2019   PLT 308 02/20/2019   Lab Results  Component Value Date   NA 139 03/05/2019   K 4.2 03/05/2019   CO2 23 03/05/2019   GLUCOSE 126 (H) 03/05/2019   BUN 9 03/05/2019   CREATININE 0.69 03/05/2019   BILITOT <0.2 03/05/2019   ALKPHOS 70 03/05/2019   AST 17 03/05/2019   ALT 25 03/05/2019   PROT 7.2 03/05/2019   ALBUMIN 4.1 03/05/2019   CALCIUM 9.6 03/05/2019   ANIONGAP 10 02/20/2019   Lab Results  Component Value Date   CHOL 118 03/05/2019   Lab Results  Component Value Date   HDL 51 03/05/2019   Lab Results  Component Value Date   LDLCALC 47 03/05/2019   Lab Results  Component Value Date   TRIG 98 03/05/2019   Lab Results  Component Value Date   CHOLHDL 2.3 03/05/2019   Lab Results  Component Value Date   HGBA1C 8.6 (A) 04/16/2019      Assessment & Plan:   Problem List Items Addressed This Visit    None    Visit Diagnoses    Type 2 diabetes mellitus with hyperglycemia, without long-term current use of insulin (HCC)    -  Primary   Relevant Medications   rosuvastatin (CRESTOR) 5 MG tablet   Other Relevant Orders   POCT glycosylated hemoglobin (Hb A1C) (Completed)     Discussed crestor  Discussed prevention heart disease Discussed her improvement of a1c Discussed other standard of care  Meds ordered this encounter  Medications   rosuvastatin (CRESTOR) 5 MG tablet    Sig: Take 1 tablet (5 mg total) by mouth once a week.    Dispense:  12 tablet    Refill:  3    Follow-up: No follow-ups on file.    Forrest Moron, MD

## 2019-04-16 NOTE — Patient Instructions (Addendum)
If you have lab work done today you will be contacted with your lab results within the next 2 weeks.  If you have not heard from [place] then please contact us. The fastest way to get your results is to register for My Chart.   IF you received an x-ray today, you will receive an invoice from [facility]. Please contact [facility] at [phone] with questions or concerns regarding your invoice.   IF you received labwork today, you will receive an invoice from [place]. Please contact [facility] at [phone] with questions or concerns regarding your invoice.   Our billing staff will not be able to assist you with questions regarding bills from these companies.  You will be contacted with the lab results as soon as they are available. The fastest way to get your results is to activate your My Chart account. Instructions are located on the last page of this paperwork. If you have not heard from [place] regarding the results in 2 weeks, please contact this office.      Diabetes Mellitus and Standards of Medical Care Managing diabetes (diabetes mellitus) can be complicated. Your diabetes treatment may be managed by a team of health care providers, including:  A physician who specializes in diabetes (endocrinologist).  A nurse practitioner or physician assistant.  Nurses.  A diet and nutrition specialist (registered dietitian).  A certified diabetes educator (CDE).  An exercise specialist.  A pharmacist.  An eye doctor.  A foot specialist (podiatrist).  A dentist.  A primary care provider.  A mental health provider. Your health care providers follow guidelines to help you get the best quality of care. The following schedule is a general guideline for your diabetes management plan. Your health care providers may give you more specific instructions. Physical exams Upon being diagnosed with diabetes mellitus, and each year after that, your health care provider will  ask about your medical and family history. He or she will also do a physical exam. Your exam may include:  Measuring your height, weight, and body mass index (BMI).  Checking your blood pressure. This will be done at every routine medical visit. Your target blood pressure may vary depending on your medical conditions, your age, and other factors.  Thyroid gland exam.  Skin exam.  Screening for damage to your nerves (peripheral neuropathy). This may include checking the pulse in your legs and feet and checking the level of sensation in your hands and feet.  A complete foot exam to inspect the structure and skin of your feet, including checking for cuts, bruises, redness, blisters, sores, or other problems.  Screening for blood vessel (vascular) problems, which may include checking the pulse in your legs and feet and checking your temperature. Blood tests Depending on your treatment plan and your personal needs, you may have the following tests done:  HbA1c (hemoglobin A1c). This test provides information about blood sugar (glucose) control over the previous 2-3 months. It is used to adjust your treatment plan, if needed. This test will be done: ? At least 2 times a year, if you are meeting your treatment goals. ? 4 times a year, if you are not meeting your treatment goals or if treatment goals have changed.  Lipid testing, including total, LDL, and HDL cholesterol and triglyceride levels. ? The goal for LDL is less than 100 mg/dL (5.5 mmol/L). If you are at high risk for complications, the goal is less than 70 mg/dL (3.9 mmol/L). ? The goal  HDL is 40 mg/dL (2.2 mmol/L) or higher for men and 50 mg/dL (2.8 mmol/L) or higher for women. An HDL cholesterol of 60 mg/dL (3.3 mmol/L) or higher gives some protection against heart disease. ? The goal for triglycerides is less than 150 mg/dL (8.3 mmol/L).  Liver function tests.  Kidney function tests.  Thyroid function tests. Dental and eye  exams  Visit your dentist two times a year.  If you have type 1 diabetes, your health care provider may recommend an eye exam 3-5 years after you are diagnosed, and then once a year after your first exam. ? For children with type 1 diabetes, a health care provider may recommend an eye exam when your child is age 10 or older and has had diabetes for 3-5 years. After the first exam, your child should get an eye exam once a year.  If you have type 2 diabetes, your health care provider may recommend an eye exam as soon as you are diagnosed, and then once a year after your first exam. Immunizations   The yearly flu (influenza) vaccine is recommended for everyone 6 months or older who has diabetes.  The pneumonia (pneumococcal) vaccine is recommended for everyone 2 years or older who has diabetes. If you are 65 or older, you may get the pneumonia vaccine as a series of two separate shots.  The hepatitis B vaccine is recommended for adults shortly after being diagnosed with diabetes.  Adults and children with diabetes should receive all other vaccines according to age-specific recommendations from the Centers for Disease Control and Prevention (CDC). Mental and emotional health Screening for symptoms of eating disorders, anxiety, and depression is recommended at the time of diagnosis and afterward as needed. If your screening shows that you have symptoms (positive screening result), you may need more evaluation and you may work with a mental health care provider. Treatment plan Your treatment plan will be reviewed at every medical visit. You and your health care provider will discuss:  How you are taking your medicines, including insulin.  Any side effects you are experiencing.  Your blood glucose target goals.  The frequency of your blood glucose monitoring.  Lifestyle habits, such as activity level as well as tobacco, alcohol, and substance use. Diabetes self-management education Your  health care provider will assess how well you are monitoring your blood glucose levels and whether you are taking your insulin correctly. He or she may refer you to:  A certified diabetes educator to manage your diabetes throughout your life, starting at diagnosis.  A registered dietitian who can create or review your personal nutrition plan.  An exercise specialist who can discuss your activity level and exercise plan. Summary  Managing diabetes (diabetes mellitus) can be complicated. Your diabetes treatment may be managed by a team of health care providers.  Your health care providers follow guidelines in order to help you get the best quality of care.  Standards of care including having regular physical exams, blood tests, blood pressure monitoring, immunizations, screening tests, and education about how to manage your diabetes.  Your health care providers may also give you more specific instructions based on your individual health. This information is not intended to replace advice given to you by your health care provider. Make sure you discuss any questions you have with your health care provider. Document Released: 04/30/2009 Document Revised: 03/22/2018 Document Reviewed: 03/31/2016 Elsevier Patient Education  2020 [employer]  

## 2019-04-17 ENCOUNTER — Other Ambulatory Visit: Payer: Self-pay | Admitting: Family Medicine

## 2019-04-17 DIAGNOSIS — E119 Type 2 diabetes mellitus without complications: Secondary | ICD-10-CM

## 2019-04-24 ENCOUNTER — Other Ambulatory Visit: Payer: Self-pay | Admitting: Family Medicine

## 2019-04-24 NOTE — Telephone Encounter (Signed)
Forwarding medication refill request to the clinical pool for review. 

## 2019-05-19 ENCOUNTER — Other Ambulatory Visit: Payer: Self-pay | Admitting: Family Medicine

## 2019-05-19 MED ORDER — METFORMIN HCL 500 MG PO TABS: 500.0000 mg | ORAL_TABLET | Freq: Two times a day (BID) | ORAL | 0 refills | Status: DC

## 2019-05-19 NOTE — Telephone Encounter (Signed)
Medication Refill - Medication: Metformin   Has the patient contacted their pharmacy? Yes.   Pt called and is requesting at least 30 day supply instead of the 15 day supply. Please advise.  (Agent: If no, request that the patient contact the pharmacy for the refill.) (Agent: If yes, when and what did the pharmacy advise?)  Preferred Pharmacy (with phone number or street name):  [facility] [phone] - [address]  [place] [address]  Phone: [phone] Fax: [phone]  Not a 24 hour pharmacy; exact hours not known.     Agent: Please be advised that RX refills may take up to 3 business days. We ask that you follow-up with your pharmacy.

## 2019-05-19 NOTE — Telephone Encounter (Signed)
Please see encounter below

## 2019-06-03 ENCOUNTER — Ambulatory Visit: Payer: BC Managed Care – PPO | Admitting: Cardiology

## 2019-06-06 ENCOUNTER — Ambulatory Visit: Payer: BC Managed Care – PPO | Admitting: Cardiology

## 2019-06-17 NOTE — Progress Notes (Signed)
Cardiology Office Note:    Date:  06/20/2019   ID:  Sandra Bishop, DOB 08-11-1970, MRN [id number]  PCP:  Forrest Moron, MD  Cardiologist:  No primary care provider on file.  Electrophysiologist:  None   Referring MD: Posey Boyer, MD   Chief Complaint  Patient presents with  . Palpitations    History of Present Illness:    Sandra Bishop is a 48 y.o. female with a hx of obesity, hyperlipidemia, type 2 diabetes who was referred by Dr. Linna Darner for an evaluation of palpitations.  States she has been having issues with palpitations since 2014.  She was seen by cardiology in 2017 for arm pain and palpitations.  Underwent an exercise tolerance test, which was normal.  Also did a Holter monitor, which showed occasional PVCs (<1%) amd PACs (<1%).  Diagnosed with diabetes in August 2020, A1c 12.2, started on metformin.  Improved to 8.6 on 04/16/19.  Reports that she has been having issues where she feels like heart is racing then slowing down.  Reports that it lasts for 5 minutes.  No lightheadedness, syncope, chest pain, dyspnea.  She feels like it is related to her Metformin, so stopped taking evening Metformin dose and it has improved.  She goes for walks, 1-2 miles on the weekened.  Denies any exertional chest pain or dyspnea.    Past Medical History:  Diagnosis Date  . Allergy   . Anemia   . Constipation   . Obesity     Past Surgical History:  Procedure Laterality Date  . EYE SURGERY    . TUBAL LIGATION      Current Medications: Current Meds  Medication Sig  . aspirin 81 MG chewable tablet Chew 81 mg by mouth daily.  . blood glucose meter kit and supplies Dispense based on patient and insurance preference. Use up to four times daily as directed. (FOR ICD-10 E10.9, E11.9).  ( One-Touch Verio meter)  . glucose blood (ONETOUCH VERIO) test strip TEST FOUR TIMES DAILY AS DIRECTED  . Lancets (ONETOUCH ULTRASOFT) lancets Use to check blood glucose three times daily. Use as  instructed. E11.9.  . rosuvastatin (CRESTOR) 5 MG tablet Take 1 tablet (5 mg total) by mouth once a week.  . ST JOHNS WORT PO Take 2 capsules by mouth daily.     Allergies:   Patient has no known allergies.   Social History   Socioeconomic History  . Marital status: Divorced    Spouse name: Not on file  . Number of children: 2  . Years of education: Not on file  . Highest education level: Not on file  Occupational History  . Not on file  Social Needs  . Financial resource strain: Not on file  . Food insecurity    Worry: Not on file    Inability: Not on file  . Transportation needs    Medical: Not on file    Non-medical: Not on file  Tobacco Use  . Smoking status: Former Smoker    Types: Cigarettes    Quit date: 12/28/2010    Years since quitting: 8.4  . Smokeless tobacco: Never Used  . Tobacco comment: quit in 2012, previously smoked 15 years  Substance and Sexual Activity  . Alcohol use: Yes    Alcohol/week: 0.0 standard drinks    Comment: occ  . Drug use: No  . Sexual activity: Yes  Lifestyle  . Physical activity    Days per week: Not on file  Minutes per session: Not on file  . Stress: Not on file  Relationships  . Social Herbalist on phone: Not on file    Gets together: Not on file    Attends religious service: Not on file    Active member of club or organization: Not on file    Attends meetings of clubs or organizations: Not on file    Relationship status: Not on file  Other Topics Concern  . Not on file  Social History Narrative  . Not on file     Family History: The patient's family history includes Heart failure in her maternal grandmother; Hypertension in her mother; Pancreatic cancer (age of onset: 38) in her father.  ROS:   Please see the history of present illness.     All other systems reviewed and are negative.  EKGs/Labs/Other Studies Reviewed:    The following studies were reviewed today:   EKG:  EKG is  ordered today.   The ekg ordered today demonstrates normal sinus rhythm, rate 82, no ST/T abnormalities  Recent Labs: 02/20/2019: Platelets 308 03/05/2019: ALT 25; BUN 9; Creatinine, Ser 0.69; Hemoglobin 11.4; Potassium 4.2; Sodium 139; TSH 0.612  Recent Lipid Panel    Component Value Date/Time   CHOL 118 03/05/2019 1453   TRIG 98 03/05/2019 1453   HDL 51 03/05/2019 1453   CHOLHDL 2.3 03/05/2019 1453   CHOLHDL 2.1 09/29/2015 1621   VLDL 10 09/29/2015 1621   LDLCALC 47 03/05/2019 1453    Physical Exam:    VS:  BP (!) 140/94   Pulse 87   Ht '5\' 3"'  (1.6 m)   Wt 223 lb 9.6 oz (101.4 kg)   BMI 39.61 kg/m     Wt Readings from Last 3 Encounters:  06/20/19 223 lb 9.6 oz (101.4 kg)  04/16/19 221 lb 3.2 oz (100.3 kg)  04/08/19 223 lb 9.6 oz (101.4 kg)     GEN:  Well nourished, well developed in no acute distress HEENT: Normal NECK: No JVD; No carotid bruits LYMPHATICS: No lymphadenopathy CARDIAC: RRR, no murmurs, rubs, gallops RESPIRATORY:  Clear to auscultation without rales, wheezing or rhonchi  ABDOMEN: Soft, non-tender, non-distended MUSCULOSKELETAL:  No edema; No deformity  SKIN: Warm and dry NEUROLOGIC:  Alert and oriented x 3 PSYCHIATRIC:  Normal affect   ASSESSMENT:    1. Palpitation   2. Hyperlipidemia, unspecified hyperlipidemia type    PLAN:    In order of problems listed above:  Palpitations: Description concerning for arrhythmia.  Check Zio patch x3 days.  Check TTE to rule out structural heart disease.  Hyperlipidemia: On rosuvastatin 5 mg once weekly.  LDL 47 on 03/05/2019  Type 2 diabetes: A1c 8.6 on 04/16/2019.  On Metformin.  RTC in 3 months   Medication Adjustments/Labs and Tests Ordered: Current medicines are reviewed at length with the patient today.  Concerns regarding medicines are outlined above.  Orders Placed This Encounter  Procedures  . LONG TERM MONITOR (3-14 DAYS)  . EKG 12-Lead  . ECHOCARDIOGRAM COMPLETE   No orders of the defined types were placed  in this encounter.   Patient Instructions  Medication Instructions:  Your Physician recommend you continue on your current medication as directed.    *If you need a refill on your cardiac medications before your next appointment, please call your pharmacy*  Lab Work: None  Testing/Procedures: Your physician has requested that you have an echocardiogram. Echocardiography is a painless test that uses sound waves to create  images of your heart. It provides your doctor with information about the size and shape of your heart and how well your heart's chambers and valves are working. This procedure takes approximately one hour. There are no restrictions for this procedure. [place] [address]  Our physician has recommended that you wear an  7  DAY ZIO-PATCH monitor. The Zio patch cardiac monitor continuously records heart rhythm data for up to 14 days, this is for patients being evaluated for multiple types heart rhythms. For the first 24 hours post application, please avoid getting the Zio monitor wet in the shower or by excessive sweating during exercise. After that, feel free to carry on with regular activities. Keep soaps and lotions away from the ZIO XT Patch.        Follow-Up: At [facility], you and your health needs are our priority.  As part of our continuing mission to provide you with exceptional heart care, we have created designated Provider Care Teams.  These Care Teams include your primary Cardiologist (physician) and Advanced Practice Providers (APPs -  Physician Assistants and Nurse Practitioners) who all work together to provide you with the care you need, when you need it.  Your next appointment:   3 month(s)  The format for your next appointment:   In Person  Provider:   Oswaldo Milian, MD  [place] Monitor Instructions   Your physician has requested you wear your ZIO patch monitor_______days.   This is a single patch monitor.  [employer]  supplies one patch monitor per enrollment.  Additional stickers are not available.   Please do not apply patch if you will be having a Nuclear Stress Test, Echocardiogram, Cardiac CT, MRI, or Chest Xray during the time frame you would be wearing the monitor. The patch cannot be worn during these tests.  You cannot remove and re-apply the ZIO XT patch monitor.   Your ZIO patch monitor will be sent USPS Priority mail from [facility] directly to your home address. The monitor may also be mailed to a PO BOX if home delivery is not available.   It may take 3-5 days to receive your monitor after you have been enrolled.   Once you have received you monitor, please review enclosed instructions.  Your monitor has already been registered assigning a specific monitor serial # to you.   Applying the monitor   Shave hair from upper left chest.   Hold abrader disc by orange tab.  Rub abrader in 40 strokes over left upper chest as indicated in your monitor instructions.   Clean area with 4 enclosed alcohol pads .  Use all pads to assure are is cleaned thoroughly.  Let dry.   Apply patch as indicated in monitor instructions.  Patch will be place under collarbone on left side of chest with arrow pointing upward.   Rub patch adhesive wings for 2 minutes.Remove white label marked "1".  Remove white label marked "2".  Rub patch adhesive wings for 2 additional minutes.   While looking in a mirror, press and release button in center of patch.  A small green light will flash 3-4 times .  This will be your only indicator the monitor has been turned on.     Do not shower for the first 24 hours.  You may shower after the first 24 hours.   Press button if you feel a symptom. You will hear a small click.  Record Date, Time and Symptom  in the Patient Log Book.   When you are ready to remove patch, follow instructions on last 2 pages of Patient Log Book.  Stick patch monitor onto last page of Patient Log Book.    Place Patient Log Book in [place] box.  Use locking tab on box and tape box closed securely.  The [employer] and [employer] has [employer] on it.  Please place in mailbox as soon as possible.  Your physician should have your test results approximately 7 days after the monitor has been mailed back to [facility].   Call [place] at [phone] if you have questions regarding your ZIO XT patch monitor.  Call them immediately if you see an orange light blinking on your monitor.   If your monitor falls off in less than 4 days contact our Monitor department at [phone].  If your monitor becomes loose or falls off after 4 days call [employer] at [phone] for suggestions on securing your monitor.        Signed, Donato Heinz, MD  06/20/2019 9:30 AM    [place]

## 2019-06-20 ENCOUNTER — Ambulatory Visit (INDEPENDENT_AMBULATORY_CARE_PROVIDER_SITE_OTHER): Payer: BC Managed Care – PPO | Admitting: Cardiology

## 2019-06-20 ENCOUNTER — Telehealth: Payer: Self-pay | Admitting: Radiology

## 2019-06-20 ENCOUNTER — Other Ambulatory Visit: Payer: Self-pay

## 2019-06-20 VITALS — BP 140/94 | HR 87 | Ht 63.0 in | Wt 223.6 lb

## 2019-06-20 DIAGNOSIS — R002 Palpitations: Secondary | ICD-10-CM

## 2019-06-20 DIAGNOSIS — E785 Hyperlipidemia, unspecified: Secondary | ICD-10-CM | POA: Diagnosis not present

## 2019-06-20 NOTE — Telephone Encounter (Signed)
Enrolled patient for a 7 day Zio monitor to be mailed to patients home.  

## 2019-06-20 NOTE — Patient Instructions (Addendum)
Medication Instructions:  Your Physician recommend you continue on your current medication as directed.    *If you need a refill on your cardiac medications before your next appointment, please call your pharmacy*  Lab Work: None  Testing/Procedures: Your physician has requested that you have an echocardiogram. Echocardiography is a painless test that uses sound waves to create images of your heart. It provides your doctor with information about the size and shape of your heart and how well your heart's chambers and valves are working. This procedure takes approximately one hour. There are no restrictions for this procedure. [place] [address]  Our physician has recommended that you wear an  7  DAY ZIO-PATCH monitor. The Zio patch cardiac monitor continuously records heart rhythm data for up to 14 days, this is for patients being evaluated for multiple types heart rhythms. For the first 24 hours post application, please avoid getting the Zio monitor wet in the shower or by excessive sweating during exercise. After that, feel free to carry on with regular activities. Keep soaps and lotions away from the ZIO XT Patch.        Follow-Up: At [facility], you and your health needs are our priority.  As part of our continuing mission to provide you with exceptional heart care, we have created designated Provider Care Teams.  These Care Teams include your primary Cardiologist (physician) and Advanced Practice Providers (APPs -  Physician Assistants and Nurse Practitioners) who all work together to provide you with the care you need, when you need it.  Your next appointment:   3 month(s)  The format for your next appointment:   In Person  Provider:   Oswaldo Milian, MD  [place] Monitor Instructions   Your physician has requested you wear your ZIO patch monitor_______days.   This is a single patch monitor.  [employer] supplies one patch monitor per enrollment.   Additional stickers are not available.   Please do not apply patch if you will be having a Nuclear Stress Test, Echocardiogram, Cardiac CT, MRI, or Chest Xray during the time frame you would be wearing the monitor. The patch cannot be worn during these tests.  You cannot remove and re-apply the ZIO XT patch monitor.   Your ZIO patch monitor will be sent USPS Priority mail from [facility] directly to your home address. The monitor may also be mailed to a PO BOX if home delivery is not available.   It may take 3-5 days to receive your monitor after you have been enrolled.   Once you have received you monitor, please review enclosed instructions.  Your monitor has already been registered assigning a specific monitor serial # to you.   Applying the monitor   Shave hair from upper left chest.   Hold abrader disc by orange tab.  Rub abrader in 40 strokes over left upper chest as indicated in your monitor instructions.   Clean area with 4 enclosed alcohol pads .  Use all pads to assure are is cleaned thoroughly.  Let dry.   Apply patch as indicated in monitor instructions.  Patch will be place under collarbone on left side of chest with arrow pointing upward.   Rub patch adhesive wings for 2 minutes.Remove white label marked "1".  Remove white label marked "2".  Rub patch adhesive wings for 2 additional minutes.   While looking in a mirror, press and release button in center of patch.  A small green light  will flash 3-4 times .  This will be your only indicator the monitor has been turned on.     Do not shower for the first 24 hours.  You may shower after the first 24 hours.   Press button if you feel a symptom. You will hear a small click.  Record Date, Time and Symptom in the Patient Log Book.   When you are ready to remove patch, follow instructions on last 2 pages of Patient Log Book.  Stick patch monitor onto last page of Patient Log Book.   Place Patient Log Book in [place] box.  Use locking tab on box and tape box closed securely.  The [employer] and [employer] has [employer] on it.  Please place in mailbox as soon as possible.  Your physician should have your test results approximately 7 days after the monitor has been mailed back to [facility].   Call [place] at [phone] if you have questions regarding your ZIO XT patch monitor.  Call them immediately if you see an orange light blinking on your monitor.   If your monitor falls off in less than 4 days contact our Monitor department at [phone].  If your monitor becomes loose or falls off after 4 days call [employer] at [phone] for suggestions on securing your monitor.

## 2019-06-26 ENCOUNTER — Other Ambulatory Visit: Payer: Self-pay

## 2019-06-26 ENCOUNTER — Ambulatory Visit (HOSPITAL_COMMUNITY): Payer: BC Managed Care – PPO | Attending: Cardiology

## 2019-06-26 DIAGNOSIS — R002 Palpitations: Secondary | ICD-10-CM | POA: Insufficient documentation

## 2019-06-27 ENCOUNTER — Other Ambulatory Visit: Payer: BC Managed Care – PPO

## 2019-07-14 ENCOUNTER — Encounter: Payer: Self-pay | Admitting: Family Medicine

## 2019-07-14 ENCOUNTER — Ambulatory Visit: Payer: BC Managed Care – PPO | Admitting: Family Medicine

## 2019-07-14 ENCOUNTER — Other Ambulatory Visit: Payer: Self-pay

## 2019-07-14 VITALS — BP 148/82 | HR 96 | Temp 98.3°F | Resp 16 | Ht 63.0 in | Wt 226.8 lb

## 2019-07-14 DIAGNOSIS — E1165 Type 2 diabetes mellitus with hyperglycemia: Secondary | ICD-10-CM

## 2019-07-14 DIAGNOSIS — Z114 Encounter for screening for human immunodeficiency virus [HIV]: Secondary | ICD-10-CM | POA: Diagnosis not present

## 2019-07-14 DIAGNOSIS — Z23 Encounter for immunization: Secondary | ICD-10-CM | POA: Diagnosis not present

## 2019-07-14 LAB — POCT GLYCOSYLATED HEMOGLOBIN (HGB A1C): Hemoglobin A1C: 6.1 % — AB (ref 4.0–5.6)

## 2019-07-14 NOTE — Progress Notes (Signed)
Established Patient Office Visit  Subjective:  Patient ID: Sandra Bishop, female    DOB: 08/06/1970  Age: 48 y.o. MRN: [id number]  CC:  Chief Complaint  Patient presents with  . Diabetes    3 month f/u.  Wants to discuss possibly changing metformin    HPI Sandra Bishop presents for  Diabetes Mellitus: Patient presents for follow up of diabetes. Symptoms: increase appetite. Symptoms have gradually worsened. Patient denies hypoglycemia , nausea, paresthesia of the feet, polydipsia, polyuria and visual disturbances.  Evaluation to date has been included: hemoglobin A1C.  Home sugars: BGs have been labile ranging between 117 and 180. Treatment to date: Continued metformin which has been effective.   She skipped the metformin sometimes.  She is only taking metformin 528m in the morning.  She states that the evening dose would cause heart palpitations at night.   She reports that during Thanksgiving and Christmas she ate what she wanted.  She reports that she plans to get back on track.   Lab Results  Component Value Date   HGBA1C 8.6 (A) 04/16/2019   Obesity Body mass index is 40.18 kg/m. Wt Readings from Last 3 Encounters:  07/14/19 226 lb 12.8 oz (102.9 kg)  06/20/19 223 lb 9.6 oz (101.4 kg)  04/16/19 221 lb 3.2 oz (100.3 kg)   She reports that she is gaining weight She states that she was eating what she wanted and eating sodas. She states that she is not exercising any more and regained weight    Past Medical History:  Diagnosis Date  . Allergy   . Anemia   . Constipation   . Obesity     Past Surgical History:  Procedure Laterality Date  . EYE SURGERY    . TUBAL LIGATION      Family History  Problem Relation Age of Onset  . Hypertension Mother   . Pancreatic cancer Father 529 . Heart failure Maternal Grandmother     Social History   Socioeconomic History  . Marital status: Divorced    Spouse name: Not on file  . Number of children: 2  . Years of  education: Not on file  . Highest education level: Not on file  Occupational History  . Not on file  Tobacco Use  . Smoking status: Former Smoker    Types: Cigarettes    Quit date: 12/28/2010    Years since quitting: 8.5  . Smokeless tobacco: Never Used  . Tobacco comment: quit in 2012, previously smoked 15 years  Substance and Sexual Activity  . Alcohol use: Yes    Alcohol/week: 0.0 standard drinks    Comment: occ  . Drug use: No  . Sexual activity: Yes  Other Topics Concern  . Not on file  Social History Narrative  . Not on file   Social Determinants of Health   Financial Resource Strain:   . Difficulty of Paying Living Expenses: Not on file  Food Insecurity:   . Worried About RCharity fundraiserin the Last Year: Not on file  . Ran Out of Food in the Last Year: Not on file  Transportation Needs:   . Lack of Transportation (Medical): Not on file  . Lack of Transportation (Non-Medical): Not on file  Physical Activity:   . Days of Exercise per Week: Not on file  . Minutes of Exercise per Session: Not on file  Stress:   . Feeling of Stress : Not on file  Social Connections:   .  Frequency of Communication with Friends and Family: Not on file  . Frequency of Social Gatherings with Friends and Family: Not on file  . Attends Religious Services: Not on file  . Active Member of Clubs or Organizations: Not on file  . Attends Archivist Meetings: Not on file  . Marital Status: Not on file  Intimate Partner Violence:   . Fear of Current or Ex-Partner: Not on file  . Emotionally Abused: Not on file  . Physically Abused: Not on file  . Sexually Abused: Not on file    Outpatient Medications Prior to Visit  Medication Sig Dispense Refill  . aspirin 81 MG chewable tablet Chew 81 mg by mouth daily.    . blood glucose meter kit and supplies Dispense based on patient and insurance preference. Use up to four times daily as directed. (FOR ICD-10 E10.9, E11.9).  (  One-Touch Verio meter) 1 each 0  . glucose blood (ONETOUCH VERIO) test strip TEST FOUR TIMES DAILY AS DIRECTED 100 strip 2  . Lancets (ONETOUCH ULTRASOFT) lancets Use to check blood glucose three times daily. Use as instructed. E11.9. 100 each 12  . metFORMIN (GLUCOPHAGE) 500 MG tablet Take 1 tablet (500 mg total) by mouth 2 (two) times daily. (Patient taking differently: Take 500 mg by mouth daily with breakfast. ) 60 tablet 0  . rosuvastatin (CRESTOR) 5 MG tablet Take 1 tablet (5 mg total) by mouth once a week. 12 tablet 3  . ST JOHNS WORT PO Take 2 capsules by mouth daily.     No facility-administered medications prior to visit.    No Known Allergies  ROS Review of Systems Review of Systems  Constitutional: Negative for activity change, appetite change, chills and fever.  HENT: Negative for congestion, nosebleeds, trouble swallowing and voice change.   Respiratory: Negative for cough, shortness of breath and wheezing.   Gastrointestinal: Negative for diarrhea, nausea and vomiting.  Genitourinary: Negative for difficulty urinating, dysuria, flank pain and hematuria.  Musculoskeletal: Negative for back pain, joint swelling and neck pain.  Neurological: Negative for dizziness, speech difficulty, light-headedness and numbness.  See HPI. All other review of systems negative.     Objective:    Physical Exam  BP (!) 148/82 (BP Location: Right Arm, Patient Position: Sitting, Cuff Size: Large)   Pulse 96   Temp 98.3 F (36.8 C) (Oral)   Resp 16   Ht '5\' 3"'  (1.6 m)   Wt 226 lb 12.8 oz (102.9 kg)   LMP 06/23/2019   SpO2 99%   BMI 40.18 kg/m  Wt Readings from Last 3 Encounters:  07/14/19 226 lb 12.8 oz (102.9 kg)  06/20/19 223 lb 9.6 oz (101.4 kg)  04/16/19 221 lb 3.2 oz (100.3 kg)   Physical Exam  Constitutional: Oriented to person, place, and time. Appears well-developed and well-nourished.  HENT:  Head: Normocephalic and atraumatic.  Eyes: Conjunctivae and EOM are normal.    Cardiovascular: Normal rate, regular rhythm, normal heart sounds and intact distal pulses.  No murmur heard. Pulmonary/Chest: Effort normal and breath sounds normal. No stridor. No respiratory distress. Has no wheezes.  Neurological: Is alert and oriented to person, place, and time.  Skin: Skin is warm. Capillary refill takes less than 2 seconds.  Psychiatric: Has a normal mood and affect. Behavior is normal. Judgment and thought content normal.    Health Maintenance Due  Topic Date Due  . URINE MICROALBUMIN  05/15/1981  . HIV Screening  05/15/1986  . INFLUENZA VACCINE  02/15/2019    There are no preventive care reminders to display for this patient.  Lab Results  Component Value Date   TSH 0.612 03/05/2019   Lab Results  Component Value Date   WBC 7.6 03/05/2019   HGB 11.4 03/05/2019   HCT 35.2 03/05/2019   MCV 81.8 03/05/2019   PLT 308 02/20/2019   Lab Results  Component Value Date   NA 139 03/05/2019   K 4.2 03/05/2019   CO2 23 03/05/2019   GLUCOSE 126 (H) 03/05/2019   BUN 9 03/05/2019   CREATININE 0.69 03/05/2019   BILITOT <0.2 03/05/2019   ALKPHOS 70 03/05/2019   AST 17 03/05/2019   ALT 25 03/05/2019   PROT 7.2 03/05/2019   ALBUMIN 4.1 03/05/2019   CALCIUM 9.6 03/05/2019   ANIONGAP 10 02/20/2019   Lab Results  Component Value Date   CHOL 118 03/05/2019   Lab Results  Component Value Date   HDL 51 03/05/2019   Lab Results  Component Value Date   LDLCALC 47 03/05/2019   Lab Results  Component Value Date   TRIG 98 03/05/2019   Lab Results  Component Value Date   CHOLHDL 2.3 03/05/2019   Lab Results  Component Value Date   HGBA1C 8.6 (A) 04/16/2019      Assessment & Plan:   Problem List Items Addressed This Visit    None    Visit Diagnoses    Type 2 diabetes mellitus with hyperglycemia, without long-term current use of insulin (HCC)    -  Primary  Improved, discussed weight loss  a1c is good   Morbid Obesity -   Discussed  glycemic index and food choices -   Nutrition counseling -   Discussed NOOM, vs. Intermittent fasting vs. Keto. Pt advised to try some intermittent fasting.    Relevant Orders   Microalbumin, urine   POCT glycosylated hemoglobin (Hb A1C)   CMET with GFR   Screening for HIV (human immunodeficiency virus)       Relevant Orders   HIV antibody (with reflex)   Need for prophylactic vaccination and inoculation against influenza          No orders of the defined types were placed in this encounter.   Follow-up: No follow-ups on file.    Forrest Moron, MD

## 2019-07-14 NOTE — Patient Instructions (Addendum)
If you have lab work done today you will be contacted with your lab results within the next 2 weeks.  If you have not heard from [place] then please contact us. The fastest way to get your results is to register for My Chart.   IF you received an x-ray today, you will receive an invoice from [facility] Radiology. Please contact [facility]iology at [phone] with questions or concerns regarding your invoice.   IF you received labwork today, you will receive an invoice from [facility]. Please contact [facility] at [phone] with questions or concerns regarding your invoice.   Our billing staff will not be able to assist you with questions regarding bills from these companies.  You will be contacted with the lab results as soon as they are available. The fastest way to get your results is to activate your My Chart account. Instructions are located on the last page of this paperwork. If you have not heard from [place] regarding the results in 2 weeks, please contact this office.     Hyperglycemia Hyperglycemia occurs when the level of sugar (glucose) in the blood is too high. Glucose is a type of sugar that provides the body's main source of energy. Certain hormones (insulin and glucagon) control the level of glucose in the blood. Insulin lowers blood glucose, and glucagon increases blood glucose. Hyperglycemia can result from having too little insulin in the bloodstream, or from the body not responding normally to insulin. Hyperglycemia occurs most often in people who have diabetes (diabetes mellitus), but it can happen in people who do not have diabetes. It can develop quickly, and it can be life-threatening if it causes you to become severely dehydrated (diabetic ketoacidosis or hyperglycemic hyperosmolar state). Severe hyperglycemia is a medical emergency. What are the causes? If you have diabetes, hyperglycemia may be caused by:  Diabetes medicine.  Medicines that increase blood glucose or  affect your diabetes control.  Not eating enough, or not eating often enough.  Changes in physical activity level.  Being sick or having an infection. If you have prediabetes or undiagnosed diabetes:  Hyperglycemia may be caused by those conditions. If you do not have diabetes, hyperglycemia may be caused by:  Certain medicines, including steroid medicines, beta-blockers, epinephrine, and thiazide diuretics.  Stress.  Serious illness.  Surgery.  Diseases of the pancreas.  Infection. What increases the risk? Hyperglycemia is more likely to develop in people who have risk factors for diabetes, such as:  Having a family member with diabetes.  Having a gene for type 1 diabetes that is passed from parent to child (inherited).  Living in an area with cold weather conditions.  Exposure to certain viruses.  Certain conditions in which the body's disease-fighting (immune) system attacks itself (autoimmune disorders).  Being overweight or obese.  Having an inactive (sedentary) lifestyle.  Having been diagnosed with insulin resistance.  Having a history of prediabetes, gestational diabetes, or polycystic ovarian syndrome (PCOS).  Being of American-Indian, African-American, Hispanic/Latino, or Asian/Pacific Islander descent. What are the signs or symptoms? Hyperglycemia may not cause any symptoms. If you do have symptoms, they may include early warning signs, such as:  Increased thirst.  Hunger.  Feeling very tired.  Needing to urinate more often than usual.  Blurry vision. Other symptoms may develop if hyperglycemia gets worse, such as:  Dry mouth.  Loss of appetite.  Fruity-smelling breath.  Weakness.  Unexpected or rapid weight gain or weight loss.  Tingling or numbness in the hands  or feet.  Headache.  Skin that does not quickly return to normal after being lightly pinched and released (poor skin turgor).  Abdominal pain.  Cuts or bruises that are  slow to heal. How is this diagnosed? Hyperglycemia is diagnosed with a blood test to measure your blood glucose level. This blood test is usually done while you are having symptoms. Your health care provider may also do a physical exam and review your medical history. You may have more tests to determine the cause of your hyperglycemia, such as:  A fasting blood glucose (FBG) test. You will not be allowed to eat (you will fast) for at least 8 hours before a blood sample is taken.  An A1c (hemoglobin A1c) blood test. This provides information about blood glucose control over the previous 2-3 months.  An oral glucose tolerance test (OGTT). This measures your blood glucose at two times: ? After fasting. This is your baseline blood glucose level. ? Two hours after drinking a beverage that contains glucose. How is this treated? Treatment depends on the cause of your hyperglycemia. Treatment may include:  Taking medicine to regulate your blood glucose levels. If you take insulin or other diabetes medicines, your medicine or dosage may be adjusted.  Lifestyle changes, such as exercising more, eating healthier foods, or losing weight.  Treating an illness or infection, if this caused your hyperglycemia.  Checking your blood glucose more often.  Stopping or reducing steroid medicines, if these caused your hyperglycemia. If your hyperglycemia becomes severe and it results in hyperglycemic hyperosmolar state, you must be hospitalized and given IV fluids. Follow these instructions at home:  General instructions  Take over-the-counter and prescription medicines only as told by your health care provider.  Do not use any products that contain nicotine or tobacco, such as cigarettes and e-cigarettes. If you need help quitting, ask your health care provider.  Limit alcohol intake to no more than 1 drink per day for nonpregnant women and 2 drinks per day for men. One drink equals 12 oz of beer, 5 oz  of wine, or 1 oz of hard liquor.  Learn to manage stress. If you need help with this, ask your health care provider.  Keep all follow-up visits as told by your health care provider. This is important. Eating and drinking   Maintain a healthy weight.  Exercise regularly, as directed by your health care provider.  Stay hydrated, especially when you exercise, get sick, or spend time in hot temperatures.  Eat healthy foods, such as: ? Lean proteins. ? Complex carbohydrates. ? Fresh fruits and vegetables. ? Low-fat dairy products. ? Healthy fats.  Drink enough fluid to keep your urine clear or pale yellow. If you have diabetes:  Make sure you know the symptoms of hyperglycemia.  Follow your diabetes management plan, as told by your health care provider. Make sure you: ? Take your insulin and medicines as directed. ? Follow your exercise plan. ? Follow your meal plan. Eat on time, and do not skip meals. ? Check your blood glucose as often as directed. Make sure to check your blood glucose before and after exercise. If you exercise longer or in a different way than usual, check your blood glucose more often. ? Follow your sick day plan whenever you cannot eat or drink normally. Make this plan in advance with your health care provider.  Share your diabetes management plan with people in your workplace, school, and household.  Check your urine for ketones when  you are ill and as told by your health care provider.  Carry a medical alert card or wear medical alert jewelry. Contact a health care provider if:  Your blood glucose is at or above 240 mg/dL (13.3 mmol/L) for 2 days in a row.  You have problems keeping your blood glucose in your target range.  You have frequent episodes of hyperglycemia. Get help right away if:  You have difficulty breathing.  You have a change in how you think, feel, or act (mental status).  You have nausea or vomiting that does not go away. These  symptoms may represent a serious problem that is an emergency. Do not wait to see if the symptoms will go away. Get medical help right away. Call your local emergency services (911 in the [place] Do not drive yourself to the hospital. Summary  Hyperglycemia occurs when the level of sugar (glucose) in the blood is too high.  Hyperglycemia is diagnosed with a blood test to measure your blood glucose level. This blood test is usually done while you are having symptoms. Your health care provider may also do a physical exam and review your medical history.  If you have diabetes, follow your diabetes management plan as told by your health care provider.  Contact your health care provider if you have problems keeping your blood glucose in your target range. This information is not intended to replace advice given to you by your health care provider. Make sure you discuss any questions you have with your health care provider. Document Released: 12/27/2000 Document Revised: 03/20/2016 Document Reviewed: 03/20/2016 Elsevier Patient Education  2020 [employer].

## 2019-07-15 ENCOUNTER — Other Ambulatory Visit: Payer: Self-pay | Admitting: Family Medicine

## 2019-07-15 LAB — CMP14+EGFR
ALT: 14 IU/L (ref 0–32)
AST: 10 IU/L (ref 0–40)
Albumin/Globulin Ratio: 1.1 — ABNORMAL LOW (ref 1.2–2.2)
Albumin: 3.8 g/dL (ref 3.8–4.8)
Alkaline Phosphatase: 69 IU/L (ref 39–117)
BUN/Creatinine Ratio: 14 (ref 9–23)
BUN: 10 mg/dL (ref 6–24)
Bilirubin Total: <0.2 mg/dL (ref 0.0–1.2)
CO2: 21 mmol/L (ref 20–29)
Calcium: 8.9 mg/dL (ref 8.7–10.2)
Chloride: 106 mmol/L (ref 96–106)
Creatinine, Ser: 0.7 mg/dL (ref 0.57–1.00)
GFR calc Af Amer: 118 mL/min/{1.73_m2} (ref >59)
GFR calc non Af Amer: 103 mL/min/{1.73_m2} (ref >59)
Globulin, Total: 3.4 g/dL (ref 1.5–4.5)
Glucose: 117 mg/dL — ABNORMAL HIGH (ref 65–99)
Potassium: 4.1 mmol/L (ref 3.5–5.2)
Sodium: 139 mmol/L (ref 134–144)
Total Protein: 7.2 g/dL (ref 6.0–8.5)

## 2019-07-15 LAB — HIV ANTIBODY (ROUTINE TESTING W REFLEX): HIV Screen 4th Generation wRfx: NONREACTIVE

## 2019-07-15 LAB — MICROALBUMIN, URINE: Microalbumin, Urine: 18.6 ug/mL

## 2019-07-15 MED ORDER — METFORMIN HCL 500 MG PO TABS: 500.0000 mg | ORAL_TABLET | Freq: Every day | ORAL | 1 refills | Status: DC

## 2019-07-19 ENCOUNTER — Ambulatory Visit (INDEPENDENT_AMBULATORY_CARE_PROVIDER_SITE_OTHER): Payer: BC Managed Care – PPO

## 2019-07-19 DIAGNOSIS — R002 Palpitations: Secondary | ICD-10-CM

## 2019-07-22 ENCOUNTER — Other Ambulatory Visit: Payer: Self-pay | Admitting: Family Medicine

## 2019-07-22 DIAGNOSIS — Z1231 Encounter for screening mammogram for malignant neoplasm of breast: Secondary | ICD-10-CM

## 2019-07-23 ENCOUNTER — Ambulatory Visit: Payer: BC Managed Care – PPO

## 2019-07-23 ENCOUNTER — Other Ambulatory Visit: Payer: Self-pay

## 2019-07-23 ENCOUNTER — Ambulatory Visit
Admission: RE | Admit: 2019-07-23 | Discharge: 2019-07-23 | Disposition: A | Discharge status: Home / Self Care | Payer: BC Managed Care – PPO | Source: Ambulatory Visit | Attending: Family Medicine | Admitting: Family Medicine

## 2019-07-23 DIAGNOSIS — Z1231 Encounter for screening mammogram for malignant neoplasm of breast: Secondary | ICD-10-CM

## 2019-07-23 IMAGING — MG DIGITAL SCREENING BILAT W/ TOMO W/ CAD
6 of 10 series · 6 of 30 positions shown · non-contrast
Comparison: Previous exam(s).

CLINICAL DATA: Screening.

EXAM:
DIGITAL SCREENING BILATERAL MAMMOGRAM WITH TOMO AND CAD

[L MLO synth-2D]
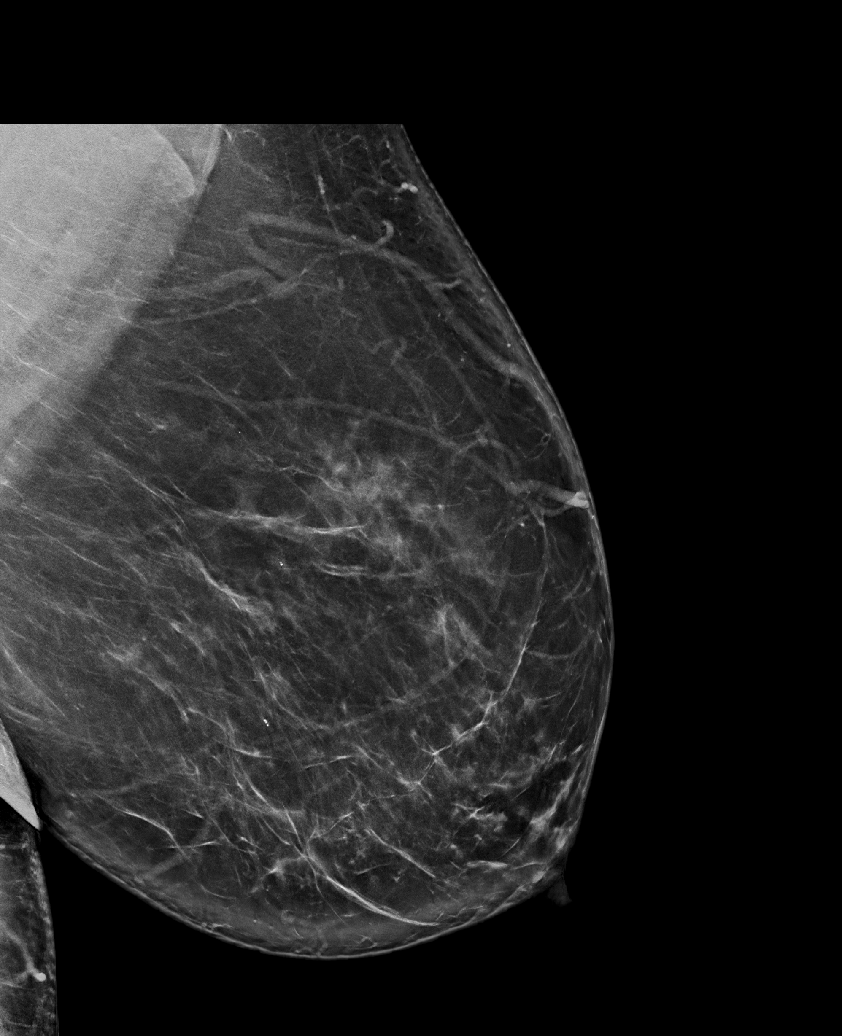

[L CC synth-2D]
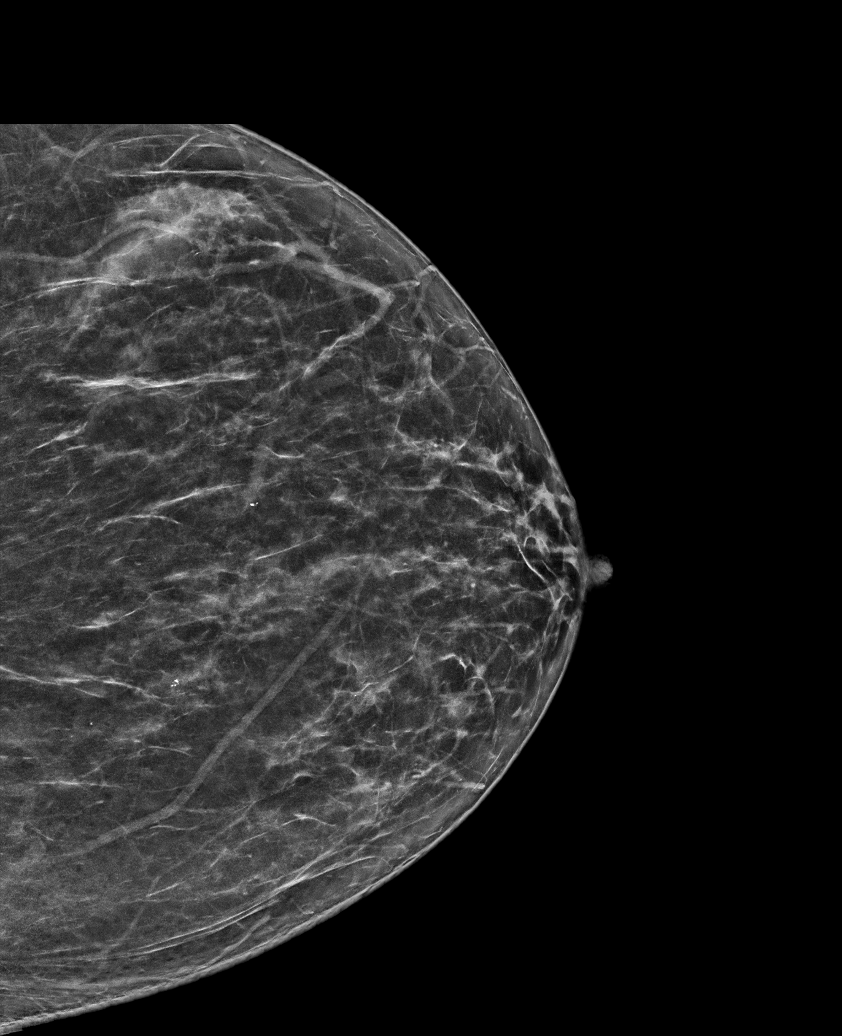

[R MLO synth-2D]
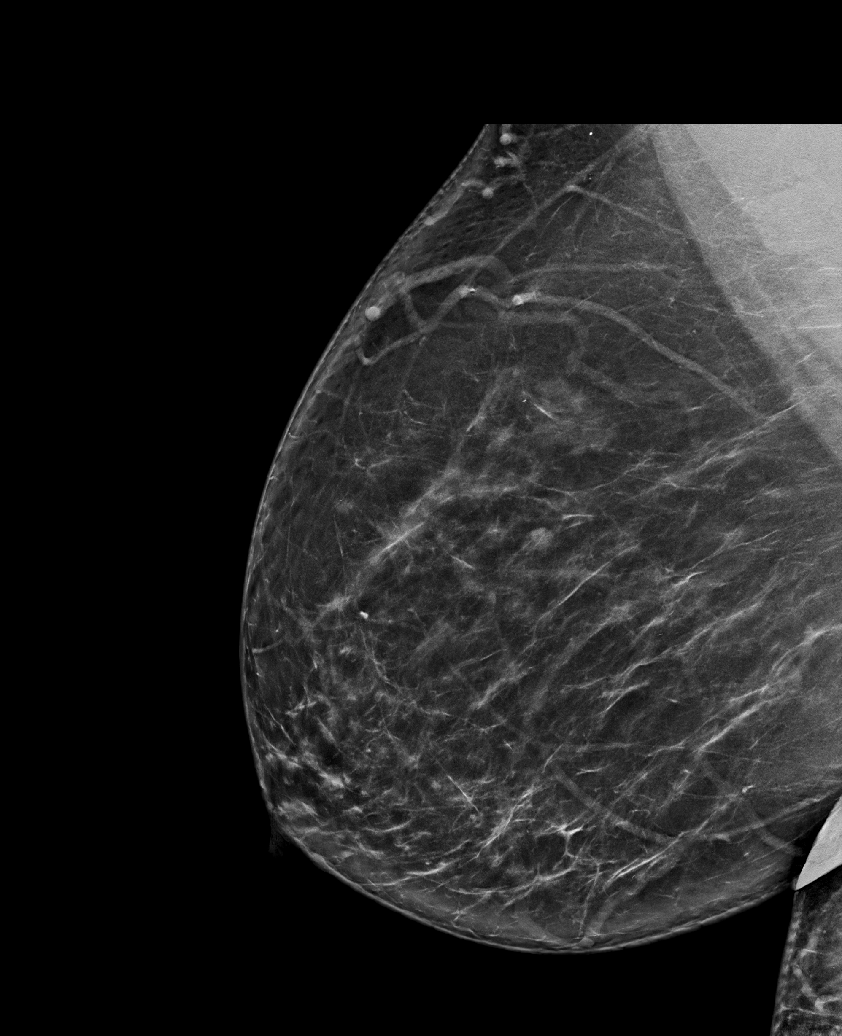

[L CV synth-2D]
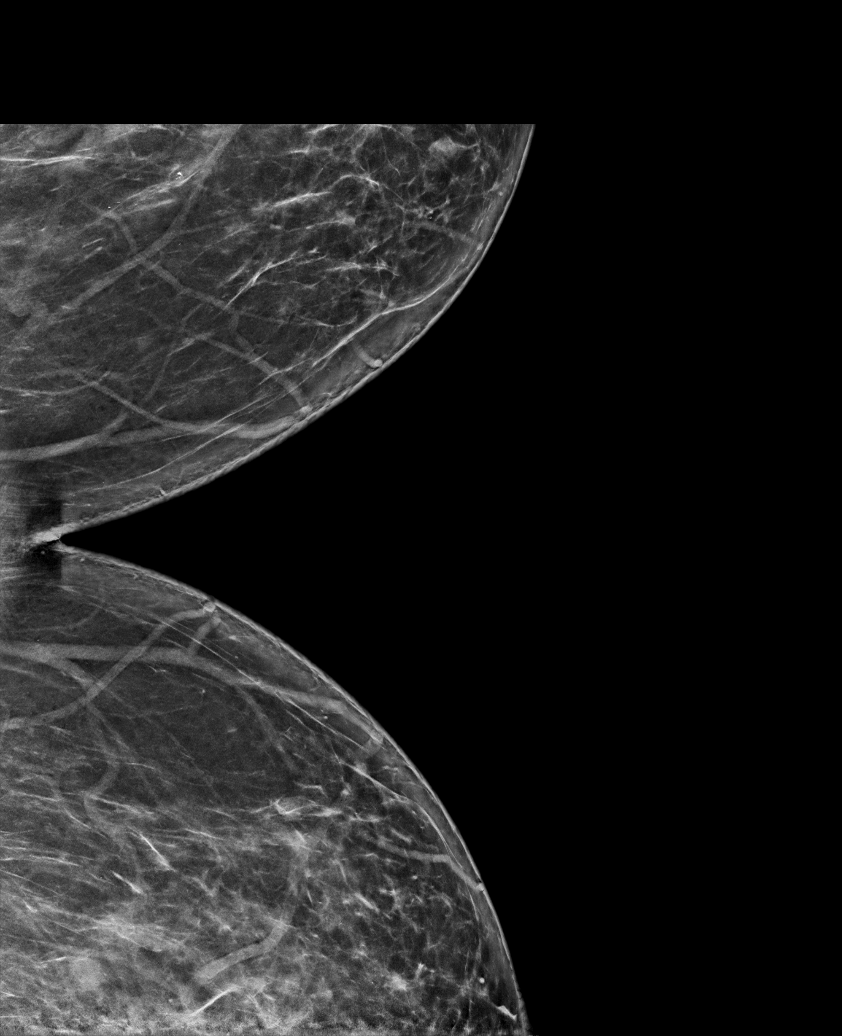

[R CC synth-2D]
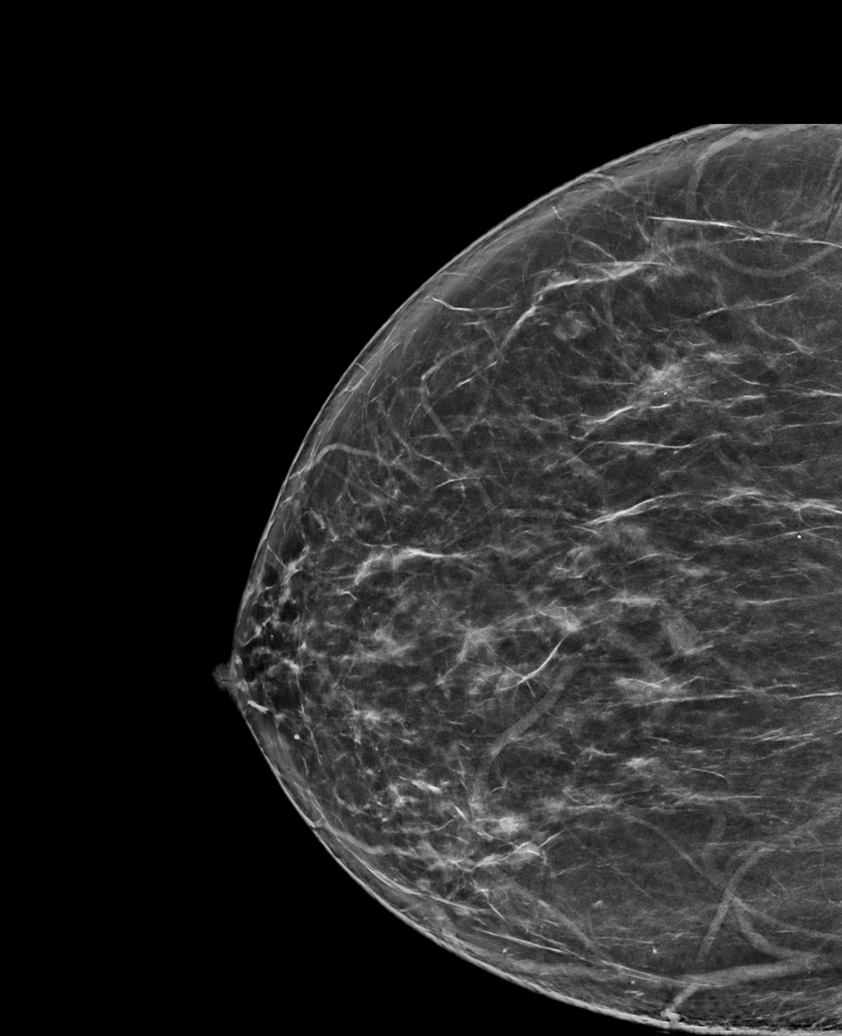

[L CV tomo · tomo slice 41/81.0]
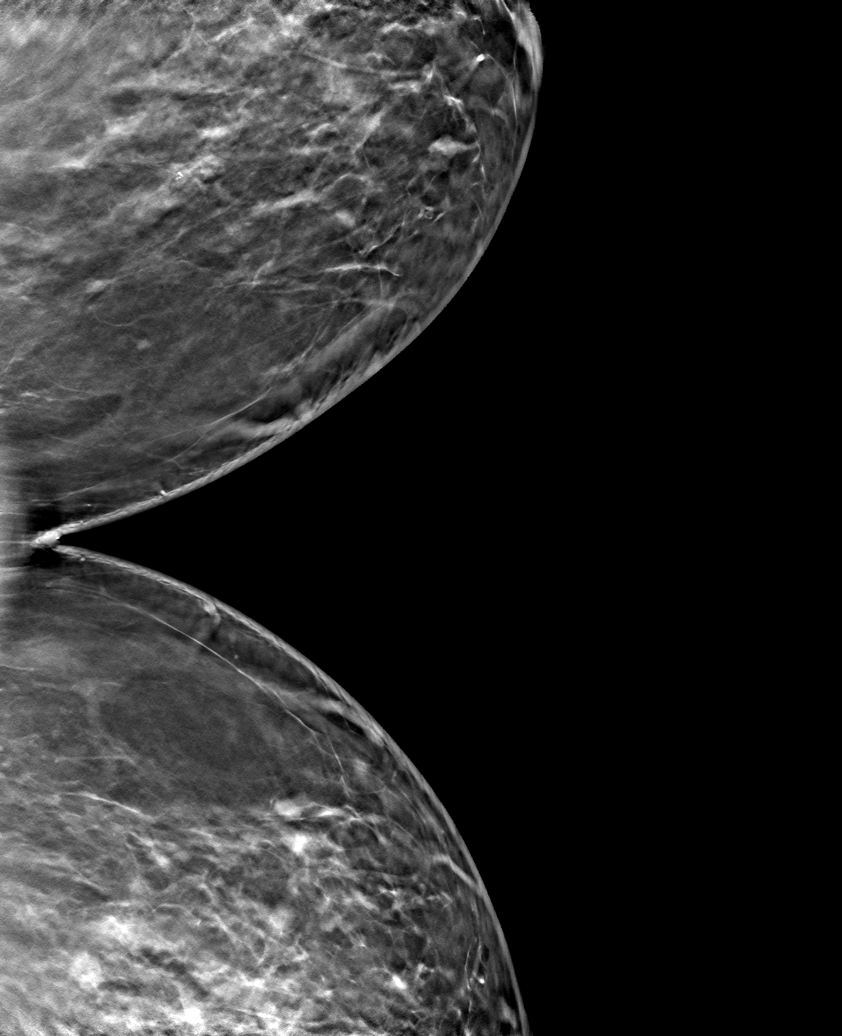

[6 of 30 positions shown; findings below may reference images not displayed]

ACR Breast Density Category b: There are scattered areas of
fibroglandular density.
FINDINGS: There are no findings suspicious for malignancy. Images were
processed with CAD.
IMPRESSION: No mammographic evidence of malignancy. A result letter of this
screening mammogram will be mailed directly to the patient.

RECOMMENDATION:
Screening mammogram in one year. (Code:[TQ])

BI-RADS CATEGORY  1: Negative.

## 2019-08-07 ENCOUNTER — Telehealth: Payer: Self-pay | Admitting: *Deleted

## 2019-08-07 NOTE — Telephone Encounter (Deleted)
Patients Zio patch monitor fell off 06/28/19, after one day.  She was not able to re-apply it.  [facility] was called and notified to send the patient a replacement monitor.  Reference # [id number].

## 2019-08-13 ENCOUNTER — Other Ambulatory Visit: Payer: Self-pay | Admitting: Family Medicine

## 2019-08-13 DIAGNOSIS — E119 Type 2 diabetes mellitus without complications: Secondary | ICD-10-CM

## 2019-08-25 ENCOUNTER — Ambulatory Visit: Payer: BC Managed Care – PPO | Admitting: Family Medicine

## 2019-09-08 ENCOUNTER — Ambulatory Visit: Payer: BC Managed Care – PPO | Admitting: Family Medicine

## 2019-09-08 ENCOUNTER — Other Ambulatory Visit: Payer: Self-pay

## 2019-09-08 ENCOUNTER — Encounter: Payer: Self-pay | Admitting: Family Medicine

## 2019-09-08 VITALS — BP 130/74 | HR 90 | Temp 98.2°F | Ht 64.0 in | Wt 228.0 lb

## 2019-09-08 DIAGNOSIS — K5909 Other constipation: Secondary | ICD-10-CM

## 2019-09-08 DIAGNOSIS — E1165 Type 2 diabetes mellitus with hyperglycemia: Secondary | ICD-10-CM | POA: Diagnosis not present

## 2019-09-08 NOTE — Patient Instructions (Addendum)
Low Glycemic Foods (20-49)  Breakfast Cereals: All-Bran                All-Bran Fruit ' n Oats Fiber One               Oatmeal (not instant)  Oat bran Fruits and fruit juices: (Limit to 1-2 servings per day) Apples               Apricots (fresh & dried)  Blackberries            Blueberries Cherries                  Cranberries             Peaches                  Pears                       Plums                       Prunes            Raspberries            Strawberries           Tangerine Tomato juice  Beans and legumes (fresh-cooked): Black-eyed peas     Butter beans Chick peas              Lentils     Green beans           Lima beans               Kidney beans          Navy beans  Non-starchy vegetables: Asparagus, bok choy, broccoli, cabbage, cauliflower, celery, cucumber, greens, lettuce, mushrooms, peppers, tomatoes, okra, onions, snow peas, spinach, summer squash  Grains: Barley                                Bulgur Rye                                    Wild rice  Nuts and oils : Almonds         Peanuts     Sunflower seeds  Hazelnuts      Pecans          Walnuts Oils that are liquid at room temperature  Dairy, fish, and meat: Milk, skim                         Lowfat cheese Yogurt, lowfat, fruit sugar sweetened Lean red meat                      Fish  Skinless chicken & [place]    Shellfish Moderate Glycemic Foods (50-69)  Breakfast Cereals: Bran Buds                             Bran Chex Just Right                            Mini-Wheats Special K         Swiss muesli  Fruits: Banana (under-ripe)  Dates Figs                                      Grapes Kiwi                                      Mango Oranges                               Raisins  Fruit Juices: Cranberry juice                    Orange juice  Beans and legumes: [place]-type baked beans Canned pinto, kidney, or navy beans Green peas  Vegetables: Beets                           Raw Carrots  Sweet potato               Yam Corn on the cob  Breads: Pita (pocket) bread          Oat bran bread Pumpernickel bread           Rye bread Wheat bread, high fiber       Grains: Cornmeal                           Rice, brown   Rice, white                         Couscous Pasta: Macaroni                           Pizza  cheese Raviolimeat filled           Spaghetti, white        Nuts: Cashews                           Macadamia  Snacks: Chocolate                    Ice cream,lowfat  Muffin                               Popcorn High Glycemic Foods (70-100)   Breakfast Cereals: Cheerios                 Corn Chex Corn Flakes            Cream of Wheat Grape Nuts              Grape Nut Flakes Life                 Nutri-Grain       Puffed Rice               Puffed Wheat Rice Chex                 Rice Krispies Shredded Wheat             Team Total Fruits: Pineapple  Watermelon Banana (over-ripe)  Beverages: Sodas, sweet tea, pineapple juice  Vegetables: Potato, baked, boiled, fried, mashed [place] fries Canned or frozen corn Cooked carrots Parsnips Winter squash  Breads: Most breads (white and whole grain) Bagels                     Bread sticks Bread stuffing          Kaiser roll Dinner rolls  Grains: Rice, instant          Tapioca, with milk  Candy and most cookies Snacks: Donuts                      Corn chips        Jelly beans                 Pretzels Pastries                             Restaurant and ethnic foods Most [place] food (sugar in stir fry or wok sauces) Teriyaki-style meats and vegetables       If you have lab work done today you will be contacted with your lab results within the next 2 weeks.  If you have not heard from [place] then please contact us. The fastest way to get your results is to register for My Chart.   IF you received an x-ray today, you will receive an invoice from [facility]. Please contact [facility] Radiology at [phone] with questions or concerns regarding your invoice.   IF you received labwork today, you will receive an invoice from [facility]. Please contact [facility] at [phone] with questions or concerns regarding your invoice.   Our billing staff will not be able to assist you with questions regarding bills from these companies.  You will be contacted with the lab results as soon as they are available. The fastest way to get your results is to activate your My Chart account. Instructions are located on the last page of this paperwork. If you have not heard from [place] regarding the results in 2 weeks, please contact this office.     Metamucil or Benefiber - take a serving before the first meal of the day  Constipation, Adult Constipation is when a person has fewer bowel movements in a week than normal, has difficulty having a bowel movement, or has stools that are dry, hard, or larger than normal. Constipation may be caused by an underlying condition. It may become worse with age if a person takes certain medicines and does not take in enough fluids. Follow these instructions at home: Eating and drinking   Eat foods that have a lot of fiber, such as fresh fruits and vegetables, whole grains, and beans.  Limit foods that are high in fat, low in fiber, or overly processed, such as french fries, hamburgers, cookies, candies, and soda.  Drink enough fluid to keep your urine clear or pale yellow. General instructions  Exercise regularly or as told by your health care provider.  Go to the restroom when you have the urge to go. Do not hold it in.  Take over-the-counter and prescription medicines only as told by your health care provider. These include any fiber supplements.  Practice pelvic floor retraining exercises, such as deep breathing while relaxing the lower abdomen and pelvic floor relaxation during bowel movements.  Watch your  condition for any changes.  Keep all follow-up  visits as told by your health care provider. This is important. Contact a health care provider if:  You have pain that gets worse.  You have a fever.  You do not have a bowel movement after 4 days.  You vomit.  You are not hungry.  You lose weight.  You are bleeding from the anus.  You have thin, pencil-like stools. Get help right away if:  You have a fever and your symptoms suddenly get worse.  You leak stool or have blood in your stool.  Your abdomen is bloated.  You have severe pain in your abdomen.  You feel dizzy or you faint. This information is not intended to replace advice given to you by your health care provider. Make sure you discuss any questions you have with your health care provider. Document Revised: 06/15/2017 Document Reviewed: 12/22/2015 Elsevier Patient Education  2020 [employer].

## 2019-09-08 NOTE — Progress Notes (Signed)
Established Patient Office Visit  Subjective:  Patient ID: Sandra Bishop, female    DOB: [birth year]  Age: 49 y.o. MRN: [id number]  CC:  Chief Complaint  Patient presents with  . Weight Check  . Diabetes    HPI CLINT STRUPP presents for   Morbid Obesity Patient is exercising 2 times a week  She works from [place] to 10am she is moving After work she walks 2 miles most days weather permitting  Wt Readings from Last 3 Encounters:  09/08/19 228 lb (103.4 kg)  07/14/19 226 lb 12.8 oz (102.9 kg)  06/20/19 223 lb 9.6 oz (101.4 kg)    Diabetes Mellitus: Patient presents for follow up of diabetes. Symptoms: hyperglycemia and weight gain. Patient denies hypoglycemia , increase appetite, polydipsia, polyuria and visual disturbances.  Evaluation to date has been included: hemoglobin A1C.   Lab Results  Component Value Date   HGBA1C 6.1 (A) 07/14/2019   She is taking metformin  She reports that she also is taking the crestor 72m.   Chronic constipation She as a BM 4 times a week. She states that she eats salads daily She would like to have a BM 3 times a day. She is not taking a fiber supplement   Past Medical History:  Diagnosis Date  . Allergy   . Anemia   . Constipation   . Obesity     Past Surgical History:  Procedure Laterality Date  . EYE SURGERY    . TUBAL LIGATION      Family History  Problem Relation Age of Onset  . Hypertension Mother   . Pancreatic cancer Father 567 . Heart failure Maternal Grandmother     Social History   Socioeconomic History  . Marital status: Divorced    Spouse name: Not on file  . Number of children: 2  . Years of education: Not on file  . Highest education level: Not on file  Occupational History  . Not on file  Tobacco Use  . Smoking status: Former Smoker    Types: Cigarettes    Quit date: 12/28/2010    Years since quitting: 8.7  . Smokeless tobacco: Never Used  . Tobacco comment: quit in 2012, previously smoked  15 years  Substance and Sexual Activity  . Alcohol use: Yes    Alcohol/week: 0.0 standard drinks    Comment: occ  . Drug use: No  . Sexual activity: Yes  Other Topics Concern  . Not on file  Social History Narrative  . Not on file   Social Determinants of Health   Financial Resource Strain:   . Difficulty of Paying Living Expenses: Not on file  Food Insecurity:   . Worried About RCharity fundraiserin the Last Year: Not on file  . Ran Out of Food in the Last Year: Not on file  Transportation Needs:   . Lack of Transportation (Medical): Not on file  . Lack of Transportation (Non-Medical): Not on file  Physical Activity:   . Days of Exercise per Week: Not on file  . Minutes of Exercise per Session: Not on file  Stress:   . Feeling of Stress : Not on file  Social Connections:   . Frequency of Communication with Friends and Family: Not on file  . Frequency of Social Gatherings with Friends and Family: Not on file  . Attends Religious Services: Not on file  . Active Member of Clubs or Organizations: Not on file  . Attends  Club or Organization Meetings: Not on file  . Marital Status: Not on file  Intimate Partner Violence:   . Fear of Current or Ex-Partner: Not on file  . Emotionally Abused: Not on file  . Physically Abused: Not on file  . Sexually Abused: Not on file    Outpatient Medications Prior to Visit  Medication Sig Dispense Refill  . aspirin 81 MG chewable tablet Chew 81 mg by mouth daily.    . blood glucose meter kit and supplies Dispense based on patient and insurance preference. Use up to four times daily as directed. (FOR ICD-10 E10.9, E11.9).  ( One-Touch Verio meter) 1 each 0  . Lancets (ONETOUCH ULTRASOFT) lancets Use to check blood glucose three times daily. Use as instructed. E11.9. 100 each 12  . metFORMIN (GLUCOPHAGE) 500 MG tablet Take 1 tablet (500 mg total) by mouth daily with breakfast. 90 tablet 1  . ONETOUCH VERIO test strip TEST FOUR TIMES DAILY  AS DIRECTED 100 strip 2  . rosuvastatin (CRESTOR) 5 MG tablet Take 1 tablet (5 mg total) by mouth once a week. 12 tablet 3  . [place] JOHNS WORT PO Take 2 capsules by mouth daily.     No facility-administered medications prior to visit.    No Known Allergies  ROS Review of Systems Review of Systems  Constitutional: Negative for activity change, appetite change, chills and fever.  HENT: Negative for congestion, nosebleeds, trouble swallowing and voice change.   Respiratory: Negative for cough, shortness of breath and wheezing.   Gastrointestinal: Negative for diarrhea, nausea and vomiting.  Genitourinary: Negative for difficulty urinating, dysuria, flank pain and hematuria.  Musculoskeletal: Negative for back pain, joint swelling and neck pain.  Neurological: Negative for dizziness, speech difficulty, light-headedness and numbness.  See HPI. All other review of systems negative.     Objective:    Physical Exam  BP 130/74   Pulse 90   Temp 98.2 F (36.8 C) (Temporal)   Ht '5\' 4"'  (1.626 m)   Wt 228 lb (103.4 kg)   LMP 08/28/2019   SpO2 98%   BMI 39.14 kg/m  Wt Readings from Last 3 Encounters:  09/08/19 228 lb (103.4 kg)  07/14/19 226 lb 12.8 oz (102.9 kg)  06/20/19 223 lb 9.6 oz (101.4 kg)   Physical Exam  Constitutional: Oriented to person, place, and time. Appears well-developed and well-nourished.  HENT:  Head: Normocephalic and atraumatic.  Eyes: Conjunctivae and EOM are normal.  Cardiovascular: Normal rate, regular rhythm, normal heart sounds and intact distal pulses.  No murmur heard. Pulmonary/Chest: Effort normal and breath sounds normal. No stridor. No respiratory distress. Has no wheezes.  Neurological: Is alert and oriented to person, place, and time.  Skin: Skin is warm. Capillary refill takes less than 2 seconds.  Psychiatric: Has a normal mood and affect. Behavior is normal. Judgment and thought content normal.     Health Maintenance Due  Topic Date Due    . INFLUENZA VACCINE  02/15/2019    There are no preventive care reminders to display for this patient.  Lab Results  Component Value Date   TSH 0.612 03/05/2019   Lab Results  Component Value Date   WBC 7.6 03/05/2019   HGB 11.4 03/05/2019   HCT 35.2 03/05/2019   MCV 81.8 03/05/2019   PLT 308 02/20/2019   Lab Results  Component Value Date   NA 139 07/14/2019   K 4.1 07/14/2019   CO2 21 07/14/2019   GLUCOSE 117 (H) 07/14/2019  BUN 10 07/14/2019   CREATININE 0.70 07/14/2019   BILITOT <0.2 07/14/2019   ALKPHOS 69 07/14/2019   AST 10 07/14/2019   ALT 14 07/14/2019   PROT 7.2 07/14/2019   ALBUMIN 3.8 07/14/2019   CALCIUM 8.9 07/14/2019   ANIONGAP 10 02/20/2019   Lab Results  Component Value Date   CHOL 118 03/05/2019   Lab Results  Component Value Date   HDL 51 03/05/2019   Lab Results  Component Value Date   LDLCALC 47 03/05/2019   Lab Results  Component Value Date   TRIG 98 03/05/2019   Lab Results  Component Value Date   CHOLHDL 2.3 03/05/2019   Lab Results  Component Value Date   HGBA1C 6.1 (A) 07/14/2019      Assessment & Plan:   Problem List Items Addressed This Visit    None    Visit Diagnoses    Chronic constipation    -  Primary Add on fiber to improve constipation    Type 2 diabetes mellitus with hyperglycemia, without long-term current use of insulin (HCC)      well controlled hemoglobin a1c is at goal Continue exercise Lipids monitored and renal function in range On metformin On asa 28m Reviewed diabetic foot care Emphasized importance of eye and dental exam      Morbid [place] ([place]     Discussed weight management Increase water and fiber intake       No orders of the defined types were placed in this encounter.   Follow-up: No follow-ups on file.    ZForrest Moron MD

## 2019-09-14 NOTE — Progress Notes (Deleted)
Cardiology Office Note:    Date:  09/14/2019   ID:  QWYNN ROCHER, DOB 12/30/1970, MRN [id number]  PCP:  Forrest Moron, MD  Cardiologist:  No primary care provider on file.  Electrophysiologist:  None   Referring MD: Forrest Moron, MD   No chief complaint on file.   History of Present Illness:    Sandra Bishop is a 49 y.o. female with a hx of obesity, hyperlipidemia, type 2 diabetes who presents for follow-up.  Was referred by Dr. Linna Darner for an evaluation of palpitations.  States she has been having issues with palpitations since 2014.  She was seen by cardiology in 2017 for arm pain and palpitations.  Underwent an exercise tolerance test, which was normal.  Also did a Holter monitor, which showed occasional PVCs (<1%) amd PACs (<1%).  Diagnosed with diabetes in August 2020, A1c 12.2, started on metformin.  Improved to 8.6 on 04/16/19.  Reports that she has been having issues where she feels like heart is racing then slowing down.  Reports that it lasts for 5 minutes.  No lightheadedness, syncope, chest pain, dyspnea.  She feels like it is related to her Metformin, so stopped taking evening Metformin dose and it has improved.  She goes for walks, 1-2 miles on the weekened.  Denies any exertional chest pain or dyspnea.    TTE on 06/26/2019 showed normal LV systolic function, moderate LVH, normal RV function, no significant valvular disease.  Cardiac monitor on 08/11/2019 showed no significant abnormalities.     Past Medical History:  Diagnosis Date  . Allergy   . Anemia   . Constipation   . Obesity     Past Surgical History:  Procedure Laterality Date  . EYE SURGERY    . TUBAL LIGATION      Current Medications: No outpatient medications have been marked as taking for the 09/18/19 encounter (Appointment) with Donato Heinz, MD.     Allergies:   Patient has no known allergies.   Social History   Socioeconomic History  . Marital status: Divorced    Spouse  name: Not on file  . Number of children: 2  . Years of education: Not on file  . Highest education level: Not on file  Occupational History  . Not on file  Tobacco Use  . Smoking status: Former Smoker    Types: Cigarettes    Quit date: 12/28/2010    Years since quitting: 8.7  . Smokeless tobacco: Never Used  . Tobacco comment: quit in 2012, previously smoked 15 years  Substance and Sexual Activity  . Alcohol use: Yes    Alcohol/week: 0.0 standard drinks    Comment: occ  . Drug use: No  . Sexual activity: Yes  Other Topics Concern  . Not on file  Social History Narrative  . Not on file   Social Determinants of Health   Financial Resource Strain:   . Difficulty of Paying Living Expenses: Not on file  Food Insecurity:   . Worried About Charity fundraiser in the Last Year: Not on file  . Ran Out of Food in the Last Year: Not on file  Transportation Needs:   . Lack of Transportation (Medical): Not on file  . Lack of Transportation (Non-Medical): Not on file  Physical Activity:   . Days of Exercise per Week: Not on file  . Minutes of Exercise per Session: Not on file  Stress:   . Feeling of Stress : Not  on file  Social Connections:   . Frequency of Communication with Friends and Family: Not on file  . Frequency of Social Gatherings with Friends and Family: Not on file  . Attends Religious Services: Not on file  . Active Member of Clubs or Organizations: Not on file  . Attends Archivist Meetings: Not on file  . Marital Status: Not on file     Family History: The patient's family history includes Heart failure in her maternal grandmother; Hypertension in her mother; Pancreatic cancer (age of onset: 12) in her father.  ROS:   Please see the history of present illness.     All other systems reviewed and are negative.  EKGs/Labs/Other Studies Reviewed:    The following studies were reviewed today:   EKG:  EKG is  ordered today.  The ekg ordered today  demonstrates normal sinus rhythm, rate 82, no ST/T abnormalities  Recent Labs: 02/20/2019: Platelets 308 03/05/2019: Hemoglobin 11.4; TSH 0.612 07/14/2019: ALT 14; BUN 10; Creatinine, Ser 0.70; Potassium 4.1; Sodium 139  Recent Lipid Panel    Component Value Date/Time   CHOL 118 03/05/2019 1453   TRIG 98 03/05/2019 1453   HDL 51 03/05/2019 1453   CHOLHDL 2.3 03/05/2019 1453   CHOLHDL 2.1 09/29/2015 1621   VLDL 10 09/29/2015 1621   LDLCALC 47 03/05/2019 1453    TTE 06/26/19: 1. Left ventricular ejection fraction, by visual estimation, is 55 to  60%. The left ventricle has normal function. There is moderately increased  left ventricular hypertrophy.  2. Left ventricular ejection fraction by 3D volume is is 58 %.  3. The average left ventricular global longitudinal strain is -19.8 %.  4. The left ventricle has no regional wall motion abnormalities.  5. Left ventricular diastolic parameters are indeterminate.  6. Global right ventricle has normal systolic function.The right  ventricular size is normal. No increase in right ventricular wall  thickness.  7. Left atrial size was mildly dilated.  8. Right atrial size was normal.  9. The mitral valve is normal in structure. No evidence of mitral valve  regurgitation.  10. The tricuspid valve is normal in structure. Tricuspid valve  regurgitation is not demonstrated.  11. The aortic valve is tricuspid. Aortic valve regurgitation is not  visualized. No evidence of aortic valve sclerosis or stenosis.  12. The pulmonic valve was grossly normal. Pulmonic valve regurgitation is  not visualized.  13. TR signal is inadequate for assessing pulmonary artery systolic  pressure.  14. The inferior vena cava is normal in size with greater than 50%  respiratory variability, suggesting right atrial pressure of 3 mmHg.   Physical Exam:    VS:  LMP 08/28/2019     Wt Readings from Last 3 Encounters:  09/08/19 228 lb (103.4 kg)  07/14/19  226 lb 12.8 oz (102.9 kg)  06/20/19 223 lb 9.6 oz (101.4 kg)     GEN:  Well nourished, well developed in no acute distress HEENT: Normal NECK: No JVD; No carotid bruits LYMPHATICS: No lymphadenopathy CARDIAC: RRR, no murmurs, rubs, gallops RESPIRATORY:  Clear to auscultation without rales, wheezing or rhonchi  ABDOMEN: Soft, non-tender, non-distended MUSCULOSKELETAL:  No edema; No deformity  SKIN: Warm and dry NEUROLOGIC:  Alert and oriented x 3 PSYCHIATRIC:  Normal affect   ASSESSMENT:    No diagnosis found. PLAN:    In order of problems listed above:  Palpitations: Description concerning for arrhythmia, cardiac monitor showed no significant arrhythmias.  No structural heart disease on  TTE.  LVH: Moderate on TTE.  Hyperlipidemia: On rosuvastatin 5 mg once weekly.  LDL 47 on 03/05/2019  Type 2 diabetes: A1c 8.6 on 04/16/2019.  On Metformin.  RTC in 3 months   Medication Adjustments/Labs and Tests Ordered: Current medicines are reviewed at length with the patient today.  Concerns regarding medicines are outlined above.  No orders of the defined types were placed in this encounter.  No orders of the defined types were placed in this encounter.   There are no Patient Instructions on file for this visit.   Signed, Donato Heinz, MD  09/14/2019 9:41 PM    [place]

## 2019-09-18 ENCOUNTER — Ambulatory Visit: Payer: BC Managed Care – PPO | Admitting: Cardiology

## 2019-10-06 ENCOUNTER — Ambulatory Visit: Payer: BC Managed Care – PPO | Admitting: Family Medicine

## 2019-10-06 ENCOUNTER — Other Ambulatory Visit: Payer: Self-pay

## 2019-10-06 ENCOUNTER — Encounter: Payer: Self-pay | Admitting: Family Medicine

## 2019-10-06 VITALS — BP 129/87 | HR 90 | Temp 98.0°F | Resp 18 | Ht 64.0 in | Wt 226.8 lb

## 2019-10-06 DIAGNOSIS — R5383 Other fatigue: Secondary | ICD-10-CM | POA: Diagnosis not present

## 2019-10-06 DIAGNOSIS — G479 Sleep disorder, unspecified: Secondary | ICD-10-CM | POA: Diagnosis not present

## 2019-10-06 DIAGNOSIS — E1165 Type 2 diabetes mellitus with hyperglycemia: Secondary | ICD-10-CM

## 2019-10-06 LAB — POCT GLYCOSYLATED HEMOGLOBIN (HGB A1C): Hemoglobin A1C: 6.6 % — AB (ref 4.0–5.6)

## 2019-10-06 NOTE — Patient Instructions (Addendum)
   Component     Latest Ref Rng & Units 04/16/2019 07/14/2019 10/06/2019  Hemoglobin A1C     4.0 - 5.6 % 8.6 (A) 6.1 (A) 6.6 (A)    If you have lab work done today you will be contacted with your lab results within the next 2 weeks.  If you have not heard from [place] then please contact us. The fastest way to get your results is to register for My Chart.   IF you received an x-ray today, you will receive an invoice from [facility]. Please contact [facility] at [phone] with questions or concerns regarding your invoice.   IF you received labwork today, you will receive an invoice from [place]. Please contact [facility] at [phone] with questions or concerns regarding your invoice.   Our billing staff will not be able to assist you with questions regarding bills from these companies.  You will be contacted with the lab results as soon as they are available. The fastest way to get your results is to activate your My Chart account. Instructions are located on the last page of this paperwork. If you have not heard from [place] regarding the results in 2 weeks, please contact this office.

## 2019-10-06 NOTE — Progress Notes (Signed)
Established Patient Office Visit  Subjective:  Patient ID: Sandra Bishop, female    DOB: 1971-04-16  Age: 49 y.o. MRN: [id number]  CC:  Chief Complaint  Patient presents with  . one month followup weight    per pt very fatigue x 1-2 weeks w/ no energy and is unsure why    HPI Sandra Bishop presents for   Patient reports that she has been having 3 weeks of fatigue She states that she exercises for 30 minutes twice a week She reports that her sleep is interrupted   She gets heart palpitations She has a history of anemia She drinks water, no coffee, no tea Avoids caffeine  She is exercising 2 times a week She states that she is tired but states that she tries to exercise  Wt Readings from Last 3 Encounters:  10/06/19 226 lb 12.8 oz (102.9 kg)  09/08/19 228 lb (103.4 kg)  07/14/19 226 lb 12.8 oz (102.9 kg)     Past Medical History:  Diagnosis Date  . Allergy   . Anemia   . Constipation   . Obesity     Past Surgical History:  Procedure Laterality Date  . EYE SURGERY    . TUBAL LIGATION      Family History  Problem Relation Age of Onset  . Hypertension Mother   . Pancreatic cancer Father 23  . Heart failure Maternal Grandmother     Social History   Socioeconomic History  . Marital status: Divorced    Spouse name: Not on file  . Number of children: 2  . Years of education: Not on file  . Highest education level: Not on file  Occupational History  . Not on file  Tobacco Use  . Smoking status: Former Smoker    Types: Cigarettes    Quit date: 12/28/2010    Years since quitting: 8.7  . Smokeless tobacco: Never Used  . Tobacco comment: quit in 2012, previously smoked 15 years  Substance and Sexual Activity  . Alcohol use: Yes    Alcohol/week: 0.0 standard drinks    Comment: occ  . Drug use: No  . Sexual activity: Yes  Other Topics Concern  . Not on file  Social History Narrative  . Not on file   Social Determinants of Health   Financial  Resource Strain:   . Difficulty of Paying Living Expenses:   Food Insecurity:   . Worried About Charity fundraiser in the Last Year:   . Arboriculturist in the Last Year:   Transportation Needs:   . Film/video editor (Medical):   Marland Kitchen Lack of Transportation (Non-Medical):   Physical Activity:   . Days of Exercise per Week:   . Minutes of Exercise per Session:   Stress:   . Feeling of Stress :   Social Connections:   . Frequency of Communication with Friends and Family:   . Frequency of Social Gatherings with Friends and Family:   . Attends Religious Services:   . Active Member of Clubs or Organizations:   . Attends Archivist Meetings:   Marland Kitchen Marital Status:   Intimate Partner Violence:   . Fear of Current or Ex-Partner:   . Emotionally Abused:   Marland Kitchen Physically Abused:   . Sexually Abused:     Outpatient Medications Prior to Visit  Medication Sig Dispense Refill  . aspirin 81 MG chewable tablet Chew 81 mg by mouth daily.    . blood  glucose meter kit and supplies Dispense based on patient and insurance preference. Use up to four times daily as directed. (FOR ICD-10 E10.9, E11.9).  ( One-Touch Verio meter) 1 each 0  . Lancets (ONETOUCH ULTRASOFT) lancets Use to check blood glucose three times daily. Use as instructed. E11.9. 100 each 12  . metFORMIN (GLUCOPHAGE) 500 MG tablet Take 1 tablet (500 mg total) by mouth daily with breakfast. 90 tablet 1  . ONETOUCH VERIO test strip TEST FOUR TIMES DAILY AS DIRECTED 100 strip 2  . rosuvastatin (CRESTOR) 5 MG tablet Take 1 tablet (5 mg total) by mouth once a week. 12 tablet 3  . ST JOHNS WORT PO Take 2 capsules by mouth daily.     No facility-administered medications prior to visit.    No Known Allergies  ROS Review of Systems Review of Systems  Constitutional: Negative for activity change, appetite change, chills and fever.  HENT: Negative for congestion, nosebleeds, trouble swallowing and voice change.   Respiratory:  Negative for cough, shortness of breath and wheezing.   Gastrointestinal: Negative for diarrhea, nausea and vomiting.  Genitourinary: Negative for difficulty urinating, dysuria, flank pain and hematuria.  Musculoskeletal: Negative for back pain, joint swelling and neck pain.  Neurological: Negative for dizziness, speech difficulty, light-headedness and numbness.  See HPI. All other review of systems negative.     Objective:    Physical Exam  BP 129/87 (BP Location: Right Arm, Patient Position: Sitting, Cuff Size: Large)   Pulse 90   Temp 98 F (36.7 C) (Oral)   Resp 18   Ht '5\' 4"'  (1.626 m)   Wt 226 lb 12.8 oz (102.9 kg)   LMP 09/29/2019   SpO2 100%   BMI 38.93 kg/m  Wt Readings from Last 3 Encounters:  10/06/19 226 lb 12.8 oz (102.9 kg)  09/08/19 228 lb (103.4 kg)  07/14/19 226 lb 12.8 oz (102.9 kg)    Physical Exam  Constitutional: Oriented to person, place, and time. Appears well-developed and well-nourished.  HENT:  Head: Normocephalic and atraumatic.  Eyes: Conjunctivae and EOM are normal.  Cardiovascular: Normal rate, regular rhythm, normal heart sounds and intact distal pulses.  No murmur heard. Pulmonary/Chest: Effort normal and breath sounds normal. No stridor. No respiratory distress. Has no wheezes.  Neurological: Is alert and oriented to person, place, and time.  Skin: Skin is warm. Capillary refill takes less than 2 seconds.  Psychiatric: Has a normal mood and affect. Behavior is normal. Judgment and thought content normal.    Health Maintenance Due  Topic Date Due  . INFLUENZA VACCINE  Never done    There are no preventive care reminders to display for this patient.  Lab Results  Component Value Date   TSH 0.612 03/05/2019   Lab Results  Component Value Date   WBC 7.6 03/05/2019   HGB 11.4 03/05/2019   HCT 35.2 03/05/2019   MCV 81.8 03/05/2019   PLT 308 02/20/2019   Lab Results  Component Value Date   NA 139 07/14/2019   K 4.1 07/14/2019    CO2 21 07/14/2019   GLUCOSE 117 (H) 07/14/2019   BUN 10 07/14/2019   CREATININE 0.70 07/14/2019   BILITOT <0.2 07/14/2019   ALKPHOS 69 07/14/2019   AST 10 07/14/2019   ALT 14 07/14/2019   PROT 7.2 07/14/2019   ALBUMIN 3.8 07/14/2019   CALCIUM 8.9 07/14/2019   ANIONGAP 10 02/20/2019   Lab Results  Component Value Date   CHOL 118 03/05/2019  Lab Results  Component Value Date   HDL 51 03/05/2019   Lab Results  Component Value Date   LDLCALC 47 03/05/2019   Lab Results  Component Value Date   TRIG 98 03/05/2019   Lab Results  Component Value Date   CHOLHDL 2.3 03/05/2019   Lab Results  Component Value Date   HGBA1C 6.1 (A) 07/14/2019      Assessment & Plan:   Problem List Items Addressed This Visit    None    Visit Diagnoses    Type 2 diabetes mellitus with hyperglycemia, without long-term current use of insulin (HCC)    -    Primary   Relevant Orders   Comprehensive metabolic panel   POCT glycosylated hemoglobin (Hb A1C)   Morbid obesity ([place])    -  Discussed weight loss    Relevant Orders   Comprehensive metabolic panel   Fatigue, unspecified type    -  Discussed anemia, vitamin D def and thyroid can contribute   Relevant Orders   VITAMIN D 25 Hydroxy (Vit-D Deficiency, Fractures)   TSH   CBC   Sleep disturbance    -  Will assess   Relevant Orders   TSH   CBC      No orders of the defined types were placed in this encounter.   Follow-up: No follow-ups on file.    Forrest Moron, MD

## 2019-10-07 LAB — COMPREHENSIVE METABOLIC PANEL
ALT: 14 IU/L (ref 0–32)
AST: 13 IU/L (ref 0–40)
Albumin/Globulin Ratio: 1.1 — ABNORMAL LOW (ref 1.2–2.2)
Albumin: 3.9 g/dL (ref 3.8–4.8)
Alkaline Phosphatase: 67 IU/L (ref 39–117)
BUN/Creatinine Ratio: 18 (ref 9–23)
BUN: 14 mg/dL (ref 6–24)
Bilirubin Total: <0.2 mg/dL (ref 0.0–1.2)
CO2: 25 mmol/L (ref 20–29)
Calcium: 9.4 mg/dL (ref 8.7–10.2)
Chloride: 104 mmol/L (ref 96–106)
Creatinine, Ser: 0.77 mg/dL (ref 0.57–1.00)
GFR calc Af Amer: 106 mL/min/{1.73_m2} (ref >59)
GFR calc non Af Amer: 92 mL/min/{1.73_m2} (ref >59)
Globulin, Total: 3.7 g/dL (ref 1.5–4.5)
Glucose: 145 mg/dL — ABNORMAL HIGH (ref 65–99)
Potassium: 4 mmol/L (ref 3.5–5.2)
Sodium: 140 mmol/L (ref 134–144)
Total Protein: 7.6 g/dL (ref 6.0–8.5)

## 2019-10-07 LAB — CBC
Hematocrit: 33 % — ABNORMAL LOW (ref 34.0–46.6)
Hemoglobin: 10.6 g/dL — ABNORMAL LOW (ref 11.1–15.9)
MCH: 25.8 pg — ABNORMAL LOW (ref 26.6–33.0)
MCHC: 32.1 g/dL (ref 31.5–35.7)
MCV: 80 fL (ref 79–97)
Platelets: 330 10*3/uL (ref 150–450)
RBC: 4.11 x10E6/uL (ref 3.77–5.28)
RDW: 13.9 % (ref 11.7–15.4)
WBC: 6.9 10*3/uL (ref 3.4–10.8)

## 2019-10-07 LAB — TSH: TSH: 0.561 u[IU]/mL (ref 0.450–4.500)

## 2019-10-07 LAB — VITAMIN D 25 HYDROXY (VIT D DEFICIENCY, FRACTURES): Vit D, 25-Hydroxy: 10 ng/mL — ABNORMAL LOW (ref 30.0–100.0)

## 2019-10-23 ENCOUNTER — Telehealth: Payer: Self-pay | Admitting: Family Medicine

## 2019-10-23 NOTE — Telephone Encounter (Signed)
Called pt LVM for her to call back and schedule follow up for her diabetes  appt around 01/06/20 with another provider if not following Dr Nolon Rod for continuation of care

## 2019-11-03 ENCOUNTER — Ambulatory Visit: Payer: BC Managed Care – PPO | Admitting: Family Medicine

## 2019-11-03 ENCOUNTER — Ambulatory Visit: Payer: Self-pay | Admitting: Family Medicine

## 2019-11-03 ENCOUNTER — Encounter: Payer: Self-pay | Admitting: Family Medicine

## 2019-11-03 ENCOUNTER — Other Ambulatory Visit: Payer: Self-pay

## 2019-11-03 VITALS — BP 136/82 | HR 88 | Temp 98.1°F | Resp 18 | Ht 64.0 in | Wt 228.8 lb

## 2019-11-03 DIAGNOSIS — R42 Dizziness and giddiness: Secondary | ICD-10-CM

## 2019-11-03 LAB — POCT CBC
Granulocyte percent: 57.2 %G (ref 37–80)
HCT, POC: 35 % (ref 29–41)
Hemoglobin: 11.1 g/dL (ref 11–14.6)
Lymph, poc: 3.5 — AB (ref 0.6–3.4)
MCH, POC: 25.1 pg — AB (ref 27–31.2)
MCHC: 31.7 g/dL — AB (ref 31.8–35.4)
MCV: 79.2 fL (ref 76–111)
MID (cbc): 0.5 (ref 0–0.9)
MPV: 6.8 fL (ref 0–99.8)
POC Granulocyte: 5.4 (ref 2–6.9)
POC LYMPH PERCENT: 37.4 %L (ref 10–50)
POC MID %: 5.4 %M (ref 0–12)
Platelet Count, POC: 404 10*3/uL (ref 142–424)
RBC: 4.42 M/uL (ref 4.04–5.48)
RDW, POC: 16.1 %
WBC: 9.4 10*3/uL (ref 4.6–10.2)

## 2019-11-03 LAB — POCT URINALYSIS DIP (MANUAL ENTRY)
Bilirubin, UA: NEGATIVE
Blood, UA: NEGATIVE
Glucose, UA: NEGATIVE mg/dL
Ketones, POC UA: NEGATIVE mg/dL
Leukocytes, UA: NEGATIVE
Nitrite, UA: NEGATIVE
Protein Ur, POC: NEGATIVE mg/dL
Spec Grav, UA: >=1.03 — AB (ref 1.010–1.025)
Urobilinogen, UA: 0.2 E.U./dL
pH, UA: 5 (ref 5.0–8.0)

## 2019-11-03 MED ORDER — VITAMIN D (ERGOCALCIFEROL) 1.25 MG (50000 UNIT) PO CAPS: 50000.0000 [IU] | ORAL_CAPSULE | ORAL | 1 refills | Status: DC

## 2019-11-03 MED ORDER — MECLIZINE HCL 12.5 MG PO TABS: 12.5000 mg | ORAL_TABLET | Freq: Two times a day (BID) | ORAL | 1 refills | Status: DC

## 2019-11-03 NOTE — Progress Notes (Unsigned)
Established Patient Office Visit  Subjective:  Patient ID: Sandra Bishop, female    DOB: 08-Apr-1971  Age: 49 y.o. MRN: [id number]  CC:  Chief Complaint  Patient presents with  . Dizziness    onset: 10/27/2019.  Dizziness with driving and just happens suddenly.  Per pt this has been happening around menses cycle.    HPI HARU ANSPAUGH presents for   Onset 4/12  Pt reports dizziness She reports that she noticed that she was trying to drink water because she saw "splotches in her vision".  She reports htat her sugar was 320. Lab Results  Component Value Date   HGBA1C 6.6 (A) 10/06/2019   She reports that on 10/28/19 she started menstruating she felt dizzy. She reports that when she was driving she   Past Medical History:  Diagnosis Date  . Allergy   . Anemia   . Constipation   . Obesity     Past Surgical History:  Procedure Laterality Date  . EYE SURGERY    . TUBAL LIGATION      Family History  Problem Relation Age of Onset  . Hypertension Mother   . Pancreatic cancer Father 36  . Heart failure Maternal Grandmother     Social History   Socioeconomic History  . Marital status: Divorced    Spouse name: Not on file  . Number of children: 2  . Years of education: Not on file  . Highest education level: Not on file  Occupational History  . Not on file  Tobacco Use  . Smoking status: Former Smoker    Types: Cigarettes    Quit date: 12/28/2010    Years since quitting: 8.8  . Smokeless tobacco: Never Used  . Tobacco comment: quit in 2012, previously smoked 15 years  Substance and Sexual Activity  . Alcohol use: Yes    Alcohol/week: 0.0 standard drinks    Comment: occ  . Drug use: No  . Sexual activity: Yes  Other Topics Concern  . Not on file  Social History Narrative  . Not on file   Social Determinants of Health   Financial Resource Strain:   . Difficulty of Paying Living Expenses:   Food Insecurity:   . Worried About Charity fundraiser in  the Last Year:   . Arboriculturist in the Last Year:   Transportation Needs:   . Film/video editor (Medical):   Marland Kitchen Lack of Transportation (Non-Medical):   Physical Activity:   . Days of Exercise per Week:   . Minutes of Exercise per Session:   Stress:   . Feeling of Stress :   Social Connections:   . Frequency of Communication with Friends and Family:   . Frequency of Social Gatherings with Friends and Family:   . Attends Religious Services:   . Active Member of Clubs or Organizations:   . Attends Archivist Meetings:   Marland Kitchen Marital Status:   Intimate Partner Violence:   . Fear of Current or Ex-Partner:   . Emotionally Abused:   Marland Kitchen Physically Abused:   . Sexually Abused:     Outpatient Medications Prior to Visit  Medication Sig Dispense Refill  . aspirin 81 MG chewable tablet Chew 81 mg by mouth daily.    . blood glucose meter kit and supplies Dispense based on patient and insurance preference. Use up to four times daily as directed. (FOR ICD-10 E10.9, E11.9).  ( One-Touch Verio meter) 1 each 0  .  Lancets (ONETOUCH ULTRASOFT) lancets Use to check blood glucose three times daily. Use as instructed. E11.9. 100 each 12  . metFORMIN (GLUCOPHAGE) 500 MG tablet Take 1 tablet (500 mg total) by mouth daily with breakfast. 90 tablet 1  . ONETOUCH VERIO test strip TEST FOUR TIMES DAILY AS DIRECTED 100 strip 2  . rosuvastatin (CRESTOR) 5 MG tablet Take 1 tablet (5 mg total) by mouth once a week. 12 tablet 3  . ST JOHNS WORT PO Take 2 capsules by mouth daily.     No facility-administered medications prior to visit.    No Known Allergies  ROS Review of Systems    Objective:    Physical Exam  BP 136/82 (BP Location: Right Arm, Patient Position: Sitting, Cuff Size: Large)   Pulse 88   Temp 98.1 F (36.7 C) (Temporal)   Resp 18   Ht '5\' 4"'  (1.626 m)   Wt 228 lb 12.8 oz (103.8 kg)   LMP 10/27/2019   SpO2 97%   BMI 39.27 kg/m  Wt Readings from Last 3 Encounters:    11/03/19 228 lb 12.8 oz (103.8 kg)  10/06/19 226 lb 12.8 oz (102.9 kg)  09/08/19 228 lb (103.4 kg)     Health Maintenance Due  Topic Date Due  . COVID-19 Vaccine (1) Never done    There are no preventive care reminders to display for this patient.  Lab Results  Component Value Date   TSH 0.561 10/06/2019   Lab Results  Component Value Date   WBC 9.4 11/03/2019   HGB 11.1 11/03/2019   HCT 35.0 11/03/2019   MCV 79.2 11/03/2019   PLT 330 10/06/2019   Lab Results  Component Value Date   NA 140 10/06/2019   K 4.0 10/06/2019   CO2 25 10/06/2019   GLUCOSE 145 (H) 10/06/2019   BUN 14 10/06/2019   CREATININE 0.77 10/06/2019   BILITOT <0.2 10/06/2019   ALKPHOS 67 10/06/2019   AST 13 10/06/2019   ALT 14 10/06/2019   PROT 7.6 10/06/2019   ALBUMIN 3.9 10/06/2019   CALCIUM 9.4 10/06/2019   ANIONGAP 10 02/20/2019   Lab Results  Component Value Date   CHOL 118 03/05/2019   Lab Results  Component Value Date   HDL 51 03/05/2019   Lab Results  Component Value Date   LDLCALC 47 03/05/2019   Lab Results  Component Value Date   TRIG 98 03/05/2019   Lab Results  Component Value Date   CHOLHDL 2.3 03/05/2019   Lab Results  Component Value Date   HGBA1C 6.6 (A) 10/06/2019      Assessment & Plan:   Problem List Items Addressed This Visit    None    Visit Diagnoses    Dizziness    -  Primary   Relevant Medications   meclizine (ANTIVERT) 12.5 MG tablet   Other Relevant Orders   POCT CBC (Completed)   POCT urinalysis dipstick (Completed)      Meds ordered this encounter  Medications  . Vitamin D, Ergocalciferol, (DRISDOL) 1.25 MG (50000 UNIT) CAPS capsule    Sig: Take 1 capsule (50,000 Units total) by mouth every 7 (seven) days.    Dispense:  12 capsule    Refill:  1  . meclizine (ANTIVERT) 12.5 MG tablet    Sig: Take 1 tablet (12.5 mg total) by mouth 2 (two) times daily.    Dispense:  30 tablet    Refill:  1    Follow-up: Return in about 3  weeks  (around 11/24/2019) for with Dr. Nolon Rod 11/24/2019 for dizziness and diabetes.    Forrest Moron, MD

## 2019-11-03 NOTE — Telephone Encounter (Signed)
Pt being seen in office today at 4:00 pm with Sandra Bishop

## 2019-11-03 NOTE — Telephone Encounter (Signed)
Pt reports dizziness, onset 1 week ago. States intermittent, last about 2-3 seconds then "Comes back within the minute." States is not positional, lying down helps resolve it. Denies any cold/sinus issues, no earache. States BS has been running 121-130, this am 178. Does not check BP. NT called practice, [facility], for consideration of appt within 24 hours. States Dr. Nolon Rod has availability today. Call transferred. Care advise given, pt verbalizes understanding.  Reason for Disposition . [1] MODERATE dizziness (e.g., vertigo; feels very unsteady, interferes with normal activities) AND [2] has NOT been evaluated by physician for this  Answer Assessment - Initial Assessment Questions 1. DESCRIPTION: "Describe your dizziness."     Intermittent, occurs frequently 2. VERTIGO: "Do you feel like either you or the room is spinning or tilting?"      No, lightheadedness 3. LIGHTHEADED: "Do you feel lightheaded?" (e.g., somewhat faint, woozy, weak upon standing)     yes 4. SEVERITY: "How bad is it?"  "Can you walk?"   - MILD - Feels unsteady but walking normally.   - MODERATE - Feels very unsteady when walking, but not falling; interferes with normal activities (e.g., school, work) .   - SEVERE - Unable to walk without falling (requires assistance).     moderate 5. ONSET:  "When did the dizziness begin?"     1 week ago 6. AGGRAVATING FACTORS: "Does anything make it worse?" (e.g., standing, change in head position)     Lay down makes it better 7. CAUSE: "What do you think is causing the dizziness?"     unsure 8. RECURRENT SYMPTOM: "Have you had dizziness before?" If so, ask: "When was the last time?" "What happened that time?"     Years ago, can't remember what caused it 9. OTHER SYMPTOMS: "Do you have any other symptoms?" (e.g., headache, weakness, numbness, vomiting, earache)    no  Protocols used: DIZZINESS - VERTIGO-A-AH

## 2019-11-03 NOTE — Patient Instructions (Addendum)
  1. Take a 1 month break from metformin  2. Observe your symptoms especially around your menses  3. Track your foods using myfitnesspal (app)   If you have lab work done today you will be contacted with your lab results within the next 2 weeks.  If you have not heard from [place] then please contact us. The fastest way to get your results is to register for My Chart.   IF you received an x-ray today, you will receive an invoice from [facility]. Please contact [facility] at [phone] with questions or concerns regarding your invoice.   IF you received labwork today, you will receive an invoice from [place]. Please contact [facility] at [phone] with questions or concerns regarding your invoice.   Our billing staff will not be able to assist you with questions regarding bills from these companies.  You will be contacted with the lab results as soon as they are available. The fastest way to get your results is to activate your My Chart account. Instructions are located on the last page of this paperwork. If you have not heard from [place] regarding the results in 2 weeks, please contact this office.

## 2019-11-24 ENCOUNTER — Ambulatory Visit: Payer: BC Managed Care – PPO | Admitting: Family Medicine

## 2019-11-24 ENCOUNTER — Encounter: Payer: Self-pay | Admitting: Family Medicine

## 2019-11-24 ENCOUNTER — Other Ambulatory Visit: Payer: Self-pay

## 2019-11-24 ENCOUNTER — Encounter: Payer: Self-pay | Admitting: Neurology

## 2019-11-24 VITALS — BP 127/80 | HR 107 | Temp 98.0°F | Ht 64.0 in | Wt 232.4 lb

## 2019-11-24 DIAGNOSIS — E1165 Type 2 diabetes mellitus with hyperglycemia: Secondary | ICD-10-CM

## 2019-11-24 DIAGNOSIS — R42 Dizziness and giddiness: Secondary | ICD-10-CM | POA: Diagnosis not present

## 2019-11-24 NOTE — Progress Notes (Signed)
Established Patient Office Visit  Subjective:  Patient ID: Sandra Bishop, female    DOB: 1970-09-26  Age: 49 y.o. MRN: [id number]  CC:  Chief Complaint  Patient presents with  . Medical Management of Chronic Issues    3 week f/u on diabetes and dizziness. Dizziness is a little better but has not gone away. Have not taken the meclizine.     HPI Sandra Bishop presents for   Patient is here for her diabetes follow up and to discuss her dizziness  She states that she notices the dizziness with driving so she has to pull over if she gets dizzy.  She also denies nausea.  She reports that she has also gotten dizzy on the passenger side.  She states that her last episode this weekend. Lasted 3-4 minutes No nausea or vomiting She has been drinking water. She had an icy from [employer].   Past Medical History:  Diagnosis Date  . Allergy   . Anemia   . Constipation   . Obesity     Past Surgical History:  Procedure Laterality Date  . EYE SURGERY    . TUBAL LIGATION      Family History  Problem Relation Age of Onset  . Hypertension Mother   . Pancreatic cancer Father 42  . Heart failure Maternal Grandmother     Social History   Socioeconomic History  . Marital status: Divorced    Spouse name: Not on file  . Number of children: 2  . Years of education: Not on file  . Highest education level: Not on file  Occupational History  . Not on file  Tobacco Use  . Smoking status: Former Smoker    Types: Cigarettes    Quit date: 12/28/2010    Years since quitting: 8.9  . Smokeless tobacco: Never Used  . Tobacco comment: quit in 2012, previously smoked 15 years  Substance and Sexual Activity  . Alcohol use: Yes    Alcohol/week: 0.0 standard drinks    Comment: occ  . Drug use: No  . Sexual activity: Yes  Other Topics Concern  . Not on file  Social History Narrative  . Not on file   Social Determinants of Health   Financial Resource Strain:   . Difficulty of  Paying Living Expenses:   Food Insecurity:   . Worried About Charity fundraiser in the Last Year:   . Arboriculturist in the Last Year:   Transportation Needs:   . Film/video editor (Medical):   Marland Kitchen Lack of Transportation (Non-Medical):   Physical Activity:   . Days of Exercise per Week:   . Minutes of Exercise per Session:   Stress:   . Feeling of Stress :   Social Connections:   . Frequency of Communication with Friends and Family:   . Frequency of Social Gatherings with Friends and Family:   . Attends Religious Services:   . Active Member of Clubs or Organizations:   . Attends Archivist Meetings:   Marland Kitchen Marital Status:   Intimate Partner Violence:   . Fear of Current or Ex-Partner:   . Emotionally Abused:   Marland Kitchen Physically Abused:   . Sexually Abused:     Outpatient Medications Prior to Visit  Medication Sig Dispense Refill  . aspirin 81 MG chewable tablet Chew 81 mg by mouth daily.    . blood glucose meter kit and supplies Dispense based on patient and insurance preference. Use  up to four times daily as directed. (FOR ICD-10 E10.9, E11.9).  ( One-Touch Verio meter) 1 each 0  . Lancets (ONETOUCH ULTRASOFT) lancets Use to check blood glucose three times daily. Use as instructed. E11.9. 100 each 12  . meclizine (ANTIVERT) 12.5 MG tablet Take 1 tablet (12.5 mg total) by mouth 2 (two) times daily. 30 tablet 1  . metFORMIN (GLUCOPHAGE) 500 MG tablet Take 1 tablet (500 mg total) by mouth daily with breakfast. 90 tablet 1  . ONETOUCH VERIO test strip TEST FOUR TIMES DAILY AS DIRECTED 100 strip 2  . rosuvastatin (CRESTOR) 5 MG tablet Take 1 tablet (5 mg total) by mouth once a week. 12 tablet 3  . Vitamin D, Ergocalciferol, (DRISDOL) 1.25 MG (50000 UNIT) CAPS capsule Take 1 capsule (50,000 Units total) by mouth every 7 (seven) days. 12 capsule 1  . [place] WORT PO Take 2 capsules by mouth daily.     No facility-administered medications prior to visit.    No Known  Allergies  ROS Review of Systems Review of Systems  Constitutional: Negative for activity change, appetite change, chills and fever.  HENT: Negative for congestion, nosebleeds, trouble swallowing and voice change.   Respiratory: Negative for cough, shortness of breath and wheezing.   Gastrointestinal: Negative for diarrhea, nausea and vomiting.  Genitourinary: Negative for difficulty urinating, dysuria, flank pain and hematuria.  Musculoskeletal: Negative for back pain, joint swelling and neck pain.  Neurological: Negative for dizziness, speech difficulty, light-headedness and numbness.  See HPI. All other review of systems negative.     Objective:    Physical Exam  BP 127/80 (BP Location: Right Arm, Patient Position: Sitting, Cuff Size: Large)   Pulse (!) 107   Temp 98 F (36.7 C) (Temporal)   Ht '5\' 4"'  (1.626 m)   Wt 232 lb 6.4 oz (105.4 kg)   LMP 10/27/2019   SpO2 98%   BMI 39.89 kg/m  Wt Readings from Last 3 Encounters:  11/24/19 232 lb 6.4 oz (105.4 kg)  11/03/19 228 lb 12.8 oz (103.8 kg)  10/06/19 226 lb 12.8 oz (102.9 kg)   Physical Exam  Constitutional: Oriented to person, place, and time. Appears well-developed and well-nourished.  HENT:  Head: Normocephalic and atraumatic.  Eyes: Conjunctivae and EOM are normal. Tracking does not cause nystagmus or blurry vision  Ears: TM normal, not bulging, external canals Cardiovascular: Normal rate, regular rhythm, normal heart sounds and intact distal pulses.  No murmur heard. Pulmonary/Chest: Effort normal and breath sounds normal. No stridor. No respiratory distress. Has no wheezes.  Neurological: Is alert and oriented to person, place, and time.  Skin: Skin is warm. Capillary refill takes less than 2 seconds.  Psychiatric: Has a normal mood and affect. Behavior is normal. Judgment and thought content normal.    Health Maintenance Due  Topic Date Due  . COVID-19 Vaccine (1) Never done    There are no preventive  care reminders to display for this patient.  Lab Results  Component Value Date   TSH 0.561 10/06/2019   Lab Results  Component Value Date   WBC 9.4 11/03/2019   HGB 11.1 11/03/2019   HCT 35.0 11/03/2019   MCV 79.2 11/03/2019   PLT 330 10/06/2019   Lab Results  Component Value Date   NA 140 10/06/2019   K 4.0 10/06/2019   CO2 25 10/06/2019   GLUCOSE 145 (H) 10/06/2019   BUN 14 10/06/2019   CREATININE 0.77 10/06/2019   BILITOT <0.2 10/06/2019  ALKPHOS 67 10/06/2019   AST 13 10/06/2019   ALT 14 10/06/2019   PROT 7.6 10/06/2019   ALBUMIN 3.9 10/06/2019   CALCIUM 9.4 10/06/2019   ANIONGAP 10 02/20/2019   Lab Results  Component Value Date   CHOL 118 03/05/2019   Lab Results  Component Value Date   HDL 51 03/05/2019   Lab Results  Component Value Date   LDLCALC 47 03/05/2019   Lab Results  Component Value Date   TRIG 98 03/05/2019   Lab Results  Component Value Date   CHOLHDL 2.3 03/05/2019   Lab Results  Component Value Date   HGBA1C 6.6 (A) 10/06/2019      Assessment & Plan:   Problem List Items Addressed This Visit    None    Visit Diagnoses    Dizziness    -  Primary   Relevant Orders   Ambulatory referral to Neurology   Type 2 diabetes mellitus with hyperglycemia, without long-term current use of insulin ([place])        Patient with risk factor for neuropathy - diabetes Pt advised to take meclizine for as needed symptoms but not when driving Patient should follow up with Neurology If there are no other causes then will approach behavioral health Continue metformin for diabetes since she was still dizzy while off the medication  No orders of the defined types were placed in this encounter.   Follow-up: No follow-ups on file.    Forrest Moron, MD

## 2019-11-24 NOTE — Patient Instructions (Signed)
° ° ° °  If you have lab work done today you will be contacted with your lab results within the next 2 weeks.  If you have not heard from us then please contact us. The fastest way to get your results is to register for My Chart. ° ° °IF you received an x-ray today, you will receive an invoice from [facility]. Please contact  Radiology at [phone] with questions or concerns regarding your invoice.  ° °IF you received labwork today, you will receive an invoice from [facility]. Please contact [facility] at [phone] with questions or concerns regarding your invoice.  ° °Our billing staff will not be able to assist you with questions regarding bills from these companies. ° °You will be contacted with the lab results as soon as they are available. The fastest way to get your results is to activate your My Chart account. Instructions are located on the last page of this paperwork. If you have not heard from us regarding the results in 2 weeks, please contact this office. °  ° ° ° °

## 2020-01-23 ENCOUNTER — Ambulatory Visit: Payer: BC Managed Care – PPO | Admitting: Neurology

## 2020-05-24 ENCOUNTER — Ambulatory Visit
Admission: EM | Admit: 2020-05-24 | Discharge: 2020-05-24 | Disposition: A | Discharge status: Home / Self Care | Payer: BC Managed Care – PPO | Attending: Emergency Medicine | Admitting: Emergency Medicine

## 2020-05-24 DIAGNOSIS — S46912A Strain of unspecified muscle, fascia and tendon at shoulder and upper arm level, left arm, initial encounter: Secondary | ICD-10-CM | POA: Diagnosis not present

## 2020-05-24 MED ORDER — NAPROXEN 500 MG PO TABS: 500.0000 mg | ORAL_TABLET | Freq: Two times a day (BID) | ORAL | 0 refills | Status: DC

## 2020-05-24 MED ORDER — CYCLOBENZAPRINE HCL 5 MG PO TABS: 5.0000 mg | ORAL_TABLET | Freq: Two times a day (BID) | ORAL | 0 refills | Status: AC | PRN

## 2020-05-24 NOTE — Discharge Instructions (Signed)

## 2020-05-24 NOTE — ED Triage Notes (Signed)
Pt c/o lt shoulder/upper arm pain for over 2wks. States lifts boxes at work and thinks she pulled a muscle. States her PCP sent her here. Pt states taking tylenol and Voltaren with no relief.

## 2020-05-24 NOTE — ED Provider Notes (Signed)
[facility]    CSN: [id number] Arrival date & time: 05/24/20  1520      History   Chief Complaint Chief Complaint  Patient presents with  . Shoulder Pain    HPI Sandra Bishop is a 49 y.o. female  Sandra Bishop is a 49 y.o. female who presents with left shoulder pain. The symptoms began 2 weeks ago. Aggravating factors: repetitive activity, works in [employer], felt a pulling sensation when lifting trays. Pain is located laterally. Discomfort is described as aching. Symptoms are exacerbated by repetitive movements, overhead movements and lying on the shoulder. Evaluation to date: none. Therapy to date includes: OTC analgesics which are not very effective and heat.  The following portions of the patient's history were reviewed and updated as appropriate: allergies, current medications, past family history, past medical history, past social history, past surgical history and problem list.     Past Medical History:  Diagnosis Date  . Allergy   . Anemia   . Constipation   . Obesity     Patient Active Problem List   Diagnosis Date Noted  . Vaginal discharge 01/08/2019  . Bacterial vaginosis 01/08/2019  . Anemia   . Obesity   . Pancreatitis 09/02/2011  . Constipation 08/30/2011    Past Surgical History:  Procedure Laterality Date  . EYE SURGERY    . TUBAL LIGATION      OB History   No obstetric history on file.      Home Medications    Prior to Admission medications   Medication Sig Start Date End Date Taking? Authorizing Provider  aspirin 81 MG chewable tablet Chew 81 mg by mouth daily.    [provider]  blood glucose meter kit and supplies Dispense based on patient and insurance preference. Use up to four times daily as directed. (FOR ICD-10 E10.9, E11.9).  ( One-Touch Verio meter) 03/21/19   Forrest Moron, MD  cyclobenzaprine (FLEXERIL) 5 MG tablet Take 1 tablet (5 mg total) by mouth 2 (two) times daily as needed for up to 7  days for muscle spasms. 05/24/20 05/31/20  Hall-Potvin, [place], PA-C  Lancets (ONETOUCH ULTRASOFT) lancets Use to check blood glucose three times daily. Use as instructed. E11.9. 03/05/19   Forrest Moron, MD  naproxen (NAPROSYN) 500 MG tablet Take 1 tablet (500 mg total) by mouth 2 (two) times daily. 05/24/20   Hall-Potvin, [place], PA-C  ONETOUCH VERIO test strip TEST FOUR TIMES DAILY AS DIRECTED 08/13/19   Delia Chimes A, MD  rosuvastatin (CRESTOR) 5 MG tablet Take 1 tablet (5 mg total) by mouth once a week. 04/16/19   Forrest Moron, MD  Vitamin D, Ergocalciferol, (DRISDOL) 1.25 MG (50000 UNIT) CAPS capsule Take 1 capsule (50,000 Units total) by mouth every 7 (seven) days. 11/03/19   Forrest Moron, MD    Family History Family History  Problem Relation Age of Onset  . Hypertension Mother   . Pancreatic cancer Father 45  . Heart failure Maternal Grandmother     Social History Social History   Tobacco Use  . Smoking status: Former Smoker    Types: Cigarettes    Quit date: 12/28/2010    Years since quitting: 9.4  . Smokeless tobacco: Never Used  . Tobacco comment: quit in 2012, previously smoked 15 years  Vaping Use  . Vaping Use: Never assessed  Substance Use Topics  . Alcohol use: Yes    Alcohol/week: 0.0 standard drinks    Comment: occ  .  Drug use: No     Allergies   Patient has no known allergies.   Review of Systems Review of Systems  Constitutional: Negative for fatigue and fever.  HENT: Negative for ear pain, sinus pain, sore throat and voice change.   Eyes: Negative for pain, redness and visual disturbance.  Respiratory: Negative for cough and shortness of breath.   Cardiovascular: Negative for chest pain and palpitations.  Gastrointestinal: Negative for abdominal pain, diarrhea and vomiting.  Musculoskeletal: Negative for arthralgias and myalgias.       Pos for shoulder pain, L  Skin: Negative for rash and wound.  Neurological: Negative for syncope and  headaches.     Physical Exam Triage Vital Signs ED Triage Vitals [05/24/20 1627]  Enc Vitals Group     BP (!) 159/98     Pulse Rate 99     Resp 18     Temp 98.2 F (36.8 C)     Temp Source Oral     SpO2 98 %     Weight      Height      Head Circumference      Peak Flow      Pain Score 4     Pain Loc      Pain Edu?      Excl. in [place]?    No data found.  Updated Vital Signs BP (!) 159/98 (BP Location: Left Arm)   Pulse 99   Temp 98.2 F (36.8 C) (Oral)   Resp 18   LMP 05/10/2020   SpO2 98%   Visual Acuity Right Eye Distance:   Left Eye Distance:   Bilateral Distance:    Right Eye Near:   Left Eye Near:    Bilateral Near:     Physical Exam Constitutional:      General: She is not in acute distress. HENT:     Head: Normocephalic and atraumatic.  Eyes:     General: No scleral icterus.    Pupils: Pupils are equal, round, and reactive to light.  Neck:     Comments: L arch/trap TTP Cardiovascular:     Rate and Rhythm: Normal rate.  Pulmonary:     Effort: Pulmonary effort is normal.  Musculoskeletal:        General: Tenderness present. No swelling. Normal range of motion.     Cervical back: Normal range of motion.     Comments: L deltoid.  No bony TTP, deformity.  NVI  Skin:    Coloration: Skin is not jaundiced or pale.  Neurological:     Mental Status: She is alert and oriented to person, place, and time.      UC Treatments / Results  Labs (all labs ordered are listed, but only abnormal results are displayed) Labs Reviewed - No data to display  EKG   Radiology No results found.  Procedures Procedures (including critical care time)  Medications Ordered in UC Medications - No data to display  Initial Impression / Assessment and Plan / UC Course  I have reviewed the triage vital signs and the nursing notes.  Pertinent labs & imaging results that were available during my care of the patient were reviewed by me and considered in my medical  decision making (see chart for details).     Exam reassuring at this time.  Likely strain vs tendonitis due to repetitive work.  Offered work note to rest; pt declined.  Will trial supportive care as below, f/u w/ ortho prn.  Return precautions discussed, pt verbalized understanding and is agreeable to plan. Final Clinical Impressions(s) / UC Diagnoses   Final diagnoses:  Left shoulder strain, initial encounter     Discharge Instructions     Heat therapy (hot compress, warm wash rag, hot showers, etc.) can help relax muscles and soothe muscle aches. Cold therapy (ice packs) can be used to help swelling both after injury and after prolonged use of areas of chronic pain/aches.  Pain medication:  500 mg Naprosyn/Aleve (naproxen) every 12 hours with food:  AVOID other NSAIDs while taking this (may have Tylenol).  May take muscle relaxer as needed for severe pain / spasm.  (This medication may cause you to become tired so it is important you do not drink alcohol or operate heavy machinery while on this medication.  Recommend your first dose to be taken before bedtime to monitor for side effects safely)  Important to follow up with specialist(s) below for further evaluation/management if your symptoms persist or worsen.    ED Prescriptions    Medication Sig Dispense Auth. Provider   cyclobenzaprine (FLEXERIL) 5 MG tablet Take 1 tablet (5 mg total) by mouth 2 (two) times daily as needed for up to 7 days for muscle spasms. 14 tablet Hall-Potvin, [place], PA-C   naproxen (NAPROSYN) 500 MG tablet Take 1 tablet (500 mg total) by mouth 2 (two) times daily. 30 tablet Hall-Potvin, [place], PA-C     I have reviewed the PDMP during this encounter.   Hall-Potvin, [place], [place] 05/24/20 1723

## 2020-08-11 ENCOUNTER — Other Ambulatory Visit: Payer: Self-pay

## 2020-08-11 ENCOUNTER — Encounter: Payer: Self-pay | Admitting: Registered Nurse

## 2020-08-11 ENCOUNTER — Ambulatory Visit (INDEPENDENT_AMBULATORY_CARE_PROVIDER_SITE_OTHER): Payer: BC Managed Care – PPO | Admitting: Registered Nurse

## 2020-08-11 VITALS — BP 146/95 | HR 100 | Temp 98.2°F | Resp 18 | Ht 64.0 in | Wt 230.8 lb

## 2020-08-11 DIAGNOSIS — R42 Dizziness and giddiness: Secondary | ICD-10-CM

## 2020-08-11 DIAGNOSIS — M791 Myalgia, unspecified site: Secondary | ICD-10-CM | POA: Diagnosis not present

## 2020-08-11 NOTE — Patient Instructions (Signed)
° ° ° °  If you have lab work done today you will be contacted with your lab results within the next 2 weeks.  If you have not heard from us then please contact us. The fastest way to get your results is to register for My Chart. ° ° °IF you received an x-ray today, you will receive an invoice from [facility]. Please contact [facility] at [phone] with questions or concerns regarding your invoice.  ° °IF you received labwork today, you will receive an invoice from [facility]. Please contact [facility] at [phone] with questions or concerns regarding your invoice.  ° °Our billing staff will not be able to assist you with questions regarding bills from these companies. ° °You will be contacted with the lab results as soon as they are available. The fastest way to get your results is to activate your My Chart account. Instructions are located on the last page of this paperwork. If you have not heard from us regarding the results in 2 weeks, please contact this office. °  ° ° ° °

## 2020-08-12 LAB — LIPID PANEL
Chol/HDL Ratio: 2.5 ratio (ref 0.0–4.4)
Cholesterol, Total: 132 mg/dL (ref 100–199)
HDL: 52 mg/dL (ref >39)
LDL Chol Calc (NIH): 64 mg/dL (ref 0–99)
Triglycerides: 80 mg/dL (ref 0–149)
VLDL Cholesterol Cal: 16 mg/dL (ref 5–40)

## 2020-08-12 LAB — MAGNESIUM: Magnesium: 2.1 mg/dL (ref 1.6–2.3)

## 2020-08-12 LAB — CBC WITH DIFFERENTIAL
Basophils Absolute: 0 10*3/uL (ref 0.0–0.2)
Basos: 0 %
EOS (ABSOLUTE): 0.2 10*3/uL (ref 0.0–0.4)
Eos: 2 %
Hematocrit: 35.9 % (ref 34.0–46.6)
Hemoglobin: 11.5 g/dL (ref 11.1–15.9)
Immature Grans (Abs): 0 10*3/uL (ref 0.0–0.1)
Immature Granulocytes: 0 %
Lymphocytes Absolute: 2.9 10*3/uL (ref 0.7–3.1)
Lymphs: 32 %
MCH: 26 pg — ABNORMAL LOW (ref 26.6–33.0)
MCHC: 32 g/dL (ref 31.5–35.7)
MCV: 81 fL (ref 79–97)
Monocytes Absolute: 1.1 10*3/uL — ABNORMAL HIGH (ref 0.1–0.9)
Monocytes: 12 %
Neutrophils Absolute: 4.8 10*3/uL (ref 1.4–7.0)
Neutrophils: 54 %
RBC: 4.43 x10E6/uL (ref 3.77–5.28)
RDW: 14.1 % (ref 11.7–15.4)
WBC: 9 10*3/uL (ref 3.4–10.8)

## 2020-08-12 LAB — COMPREHENSIVE METABOLIC PANEL
ALT: 20 IU/L (ref 0–32)
AST: 14 IU/L (ref 0–40)
Albumin/Globulin Ratio: 1.1 — ABNORMAL LOW (ref 1.2–2.2)
Albumin: 4.2 g/dL (ref 3.8–4.8)
Alkaline Phosphatase: 83 IU/L (ref 44–121)
BUN/Creatinine Ratio: 16 (ref 9–23)
BUN: 12 mg/dL (ref 6–24)
Bilirubin Total: <0.2 mg/dL (ref 0.0–1.2)
CO2: 25 mmol/L (ref 20–29)
Calcium: 9.2 mg/dL (ref 8.7–10.2)
Chloride: 104 mmol/L (ref 96–106)
Creatinine, Ser: 0.74 mg/dL (ref 0.57–1.00)
GFR calc Af Amer: 110 mL/min/{1.73_m2} (ref >59)
GFR calc non Af Amer: 95 mL/min/{1.73_m2} (ref >59)
Globulin, Total: 3.8 g/dL (ref 1.5–4.5)
Glucose: 119 mg/dL — ABNORMAL HIGH (ref 65–99)
Potassium: 4.3 mmol/L (ref 3.5–5.2)
Sodium: 140 mmol/L (ref 134–144)
Total Protein: 8 g/dL (ref 6.0–8.5)

## 2020-08-12 LAB — CK: Total CK: 85 U/L (ref 32–182)

## 2020-08-12 LAB — HEMOGLOBIN A1C
Est. average glucose Bld gHb Est-mCnc: 151 mg/dL
Hgb A1c MFr Bld: 6.9 % — ABNORMAL HIGH (ref 4.8–5.6)

## 2020-08-12 LAB — VITAMIN B12: Vitamin B-12: 291 pg/mL (ref 232–1245)

## 2020-08-12 LAB — VITAMIN D 25 HYDROXY (VIT D DEFICIENCY, FRACTURES): Vit D, 25-Hydroxy: 31.2 ng/mL (ref 30.0–100.0)

## 2020-08-12 LAB — TSH: TSH: 0.717 u[IU]/mL (ref 0.450–4.500)

## 2020-08-13 ENCOUNTER — Telehealth: Payer: Self-pay | Admitting: Family Medicine

## 2020-08-13 ENCOUNTER — Telehealth: Payer: Self-pay | Admitting: Registered Nurse

## 2020-08-13 NOTE — Telephone Encounter (Signed)
Pt would like a cb concerning her most recent lab results. Please advise at [phone].

## 2020-08-13 NOTE — Telephone Encounter (Signed)
Patient called because provider promised to call her back today to discuss lab results. Told patient provider out of office today.   Please advise at [phone]

## 2020-08-16 NOTE — Telephone Encounter (Signed)
Pt looking for lab result notes labs back from 08/11/2020

## 2020-08-30 ENCOUNTER — Ambulatory Visit: Payer: BC Managed Care – PPO | Admitting: Podiatry

## 2020-12-22 ENCOUNTER — Encounter (HOSPITAL_BASED_OUTPATIENT_CLINIC_OR_DEPARTMENT_OTHER): Payer: Self-pay | Admitting: *Deleted

## 2020-12-22 ENCOUNTER — Emergency Department (HOSPITAL_BASED_OUTPATIENT_CLINIC_OR_DEPARTMENT_OTHER)
Admission: EM | Admit: 2020-12-22 | Discharge: 2020-12-22 | Disposition: A | Discharge status: Home / Self Care | Payer: BC Managed Care – PPO | Attending: Emergency Medicine | Admitting: Emergency Medicine

## 2020-12-22 ENCOUNTER — Other Ambulatory Visit: Payer: Self-pay

## 2020-12-22 DIAGNOSIS — R739 Hyperglycemia, unspecified: Secondary | ICD-10-CM

## 2020-12-22 DIAGNOSIS — Z87891 Personal history of nicotine dependence: Secondary | ICD-10-CM | POA: Insufficient documentation

## 2020-12-22 DIAGNOSIS — E1165 Type 2 diabetes mellitus with hyperglycemia: Secondary | ICD-10-CM | POA: Insufficient documentation

## 2020-12-22 DIAGNOSIS — Z7984 Long term (current) use of oral hypoglycemic drugs: Secondary | ICD-10-CM | POA: Insufficient documentation

## 2020-12-22 DIAGNOSIS — Z7982 Long term (current) use of aspirin: Secondary | ICD-10-CM | POA: Insufficient documentation

## 2020-12-22 HISTORY — DX: Type 2 diabetes mellitus without complications: E11.9

## 2020-12-22 LAB — CBC WITH DIFFERENTIAL/PLATELET
Abs Immature Granulocytes: 0.02 10*3/uL (ref 0.00–0.07)
Basophils Absolute: 0 10*3/uL (ref 0.0–0.1)
Basophils Relative: 0 %
Eosinophils Absolute: 0.1 10*3/uL (ref 0.0–0.5)
Eosinophils Relative: 2 %
HCT: 34.7 % — ABNORMAL LOW (ref 36.0–46.0)
Hemoglobin: 10.9 g/dL — ABNORMAL LOW (ref 12.0–15.0)
Immature Granulocytes: 0 %
Lymphocytes Relative: 34 %
Lymphs Abs: 2.7 10*3/uL (ref 0.7–4.0)
MCH: 25.6 pg — ABNORMAL LOW (ref 26.0–34.0)
MCHC: 31.4 g/dL (ref 30.0–36.0)
MCV: 81.5 fL (ref 80.0–100.0)
Monocytes Absolute: 0.8 10*3/uL (ref 0.1–1.0)
Monocytes Relative: 10 %
Neutro Abs: 4.1 10*3/uL (ref 1.7–7.7)
Neutrophils Relative %: 54 %
Platelets: 286 10*3/uL (ref 150–400)
RBC: 4.26 MIL/uL (ref 3.87–5.11)
RDW: 14.4 % (ref 11.5–15.5)
WBC: 7.7 10*3/uL (ref 4.0–10.5)
nRBC: 0 % (ref 0.0–0.2)

## 2020-12-22 LAB — HEPATIC FUNCTION PANEL
ALT: 14 U/L (ref 0–44)
AST: 11 U/L — ABNORMAL LOW (ref 15–41)
Albumin: 3.8 g/dL (ref 3.5–5.0)
Alkaline Phosphatase: 61 U/L (ref 38–126)
Bilirubin, Direct: <0.1 mg/dL (ref 0.0–0.2)
Total Bilirubin: 0.3 mg/dL (ref 0.3–1.2)
Total Protein: 7.3 g/dL (ref 6.5–8.1)

## 2020-12-22 LAB — URINALYSIS, ROUTINE W REFLEX MICROSCOPIC
Bilirubin Urine: NEGATIVE
Glucose, UA: NEGATIVE mg/dL
Hgb urine dipstick: NEGATIVE
Ketones, ur: NEGATIVE mg/dL
Leukocytes,Ua: NEGATIVE
Nitrite: NEGATIVE
Protein, ur: NEGATIVE mg/dL
Specific Gravity, Urine: 1.017 (ref 1.005–1.030)
pH: 5.5 (ref 5.0–8.0)

## 2020-12-22 LAB — BASIC METABOLIC PANEL
Anion gap: 10 (ref 5–15)
BUN: 11 mg/dL (ref 6–20)
CO2: 24 mmol/L (ref 22–32)
Calcium: 8.9 mg/dL (ref 8.9–10.3)
Chloride: 101 mmol/L (ref 98–111)
Creatinine, Ser: 0.69 mg/dL (ref 0.44–1.00)
GFR, Estimated: >60 mL/min (ref >60)
Glucose, Bld: 149 mg/dL — ABNORMAL HIGH (ref 70–99)
Potassium: 3.6 mmol/L (ref 3.5–5.1)
Sodium: 135 mmol/L (ref 135–145)

## 2020-12-22 LAB — PREGNANCY, URINE: Preg Test, Ur: NEGATIVE

## 2020-12-22 MED ORDER — METFORMIN HCL 500 MG PO TABS: 500.0000 mg | ORAL_TABLET | Freq: Two times a day (BID) | ORAL | 0 refills | Status: DC

## 2020-12-22 MED ORDER — SODIUM CHLORIDE 0.9 % IV BOLUS
1000.0000 mL | Freq: Once | INTRAVENOUS | Status: AC
  Administered 2020-12-22: 1000 mL via INTRAVENOUS

## 2020-12-22 MED ORDER — ONDANSETRON HCL 4 MG/2ML IJ SOLN
4.0000 mg | Freq: Once | INTRAMUSCULAR | Status: AC
  Administered 2020-12-22: 4 mg via INTRAVENOUS
  Filled 2020-12-22: qty 2

## 2020-12-22 NOTE — ED Triage Notes (Signed)
Dizziness, kind of nervous and patient stated that she was diabetic and was off from all her meds per PCP advised and she think that might be the reason why she felt off for 2 weeks.

## 2020-12-22 NOTE — ED Provider Notes (Signed)
[facility] EMERGENCY DEPT Provider Note   CSN: [id number] Arrival date & time: 12/22/20  1518     History Chief Complaint  Patient presents with  . Hyperglycemia    Sandra Bishop is a 50 y.o. female.  The history is provided by the patient.  Hyperglycemia Blood sugar level PTA:  >200 Severity:  Mild Onset quality:  Gradual Timing:  Intermittent Progression:  Waxing and waning Chronicity:  Recurrent Current diabetic therapy:  Used to be on metformin but not anymore Relieved by:  Nothing Associated symptoms: dehydration and dizziness (at times)   Associated symptoms: no abdominal pain, no altered mental status, no blurred vision, no chest pain, no confusion, no dysuria, no fever, no shortness of breath, no vomiting and no weakness        Past Medical History:  Diagnosis Date  . Allergy   . Anemia   . Constipation   . Diabetes mellitus without complication ([place] [facility])   . Obesity     Patient Active Problem List   Diagnosis Date Noted  . Vaginal discharge 01/08/2019  . Bacterial vaginosis 01/08/2019  . Anemia   . Obesity   . Pancreatitis 09/02/2011  . Constipation 08/30/2011    Past Surgical History:  Procedure Laterality Date  . EYE SURGERY    . TUBAL LIGATION       OB History    Gravida  3   Para      Term      Preterm      AB      Living        SAB      IAB      Ectopic      Multiple      Live Births              Family History  Problem Relation Age of Onset  . Hypertension Mother   . Pancreatic cancer Father 28  . Heart failure Maternal Grandmother     Social History   Tobacco Use  . Smoking status: Former Smoker    Types: Cigarettes    Quit date: 12/28/2010    Years since quitting: 9.9  . Smokeless tobacco: Never Used  . Tobacco comment: quit in 2012, previously smoked 15 years  Substance Use Topics  . Alcohol use: Yes    Alcohol/week: 0.0 standard drinks    Comment: occ  . Drug use: No    Home  Medications Prior to Admission medications   Medication Sig Start Date End Date Taking? Authorizing Provider  aspirin 81 MG chewable tablet Chew 81 mg by mouth daily.   Yes [provider]  metFORMIN (GLUCOPHAGE) 500 MG tablet Take 1 tablet (500 mg total) by mouth 2 (two) times daily with a meal. 12/22/20 01/21/21 Yes Kamonte Mcmichen, DO  blood glucose meter kit and supplies Dispense based on patient and insurance preference. Use up to four times daily as directed. (FOR ICD-10 E10.9, E11.9).  ( One-Touch Verio meter) 03/21/19   Forrest Moron, MD  Lancets [facility] ULTRASOFT) lancets Use to check blood glucose three times daily. Use as instructed. E11.9. 03/05/19   Forrest Moron, MD  naproxen (NAPROSYN) 500 MG tablet Take 1 tablet (500 mg total) by mouth 2 (two) times daily. 05/24/20   Hall-Potvin, [place], PA-C  ONETOUCH VERIO test strip TEST FOUR TIMES DAILY AS DIRECTED 08/13/19   Forrest Moron, MD    Allergies    Patient has no known allergies.  Review of  Systems   Review of Systems  Constitutional: Negative for chills and fever.  HENT: Negative for ear pain and sore throat.   Eyes: Negative for blurred vision, pain and visual disturbance.  Respiratory: Negative for cough and shortness of breath.   Cardiovascular: Negative for chest pain and palpitations.  Gastrointestinal: Negative for abdominal pain and vomiting.  Genitourinary: Negative for dysuria and hematuria.  Musculoskeletal: Negative for arthralgias and back pain.  Skin: Negative for color change and rash.  Neurological: Positive for dizziness (at times). Negative for tremors, seizures, syncope, facial asymmetry, speech difficulty, weakness, numbness and headaches.  Psychiatric/Behavioral: Negative for confusion.  All other systems reviewed and are negative.   Physical Exam Updated Vital Signs Ht 5' 2" (1.575 m)   Wt 106.6 kg   LMP 12/15/2020   BMI 42.98 kg/m   Physical Exam Vitals and nursing note  reviewed.  Constitutional:      General: She is not in acute distress.    Appearance: She is well-developed. She is not ill-appearing.  HENT:     Head: Normocephalic and atraumatic.     Nose: Nose normal.     Mouth/Throat:     Mouth: Mucous membranes are dry.  Eyes:     Extraocular Movements: Extraocular movements intact.     Conjunctiva/sclera: Conjunctivae normal.     Pupils: Pupils are equal, round, and reactive to light.  Cardiovascular:     Rate and Rhythm: Normal rate and regular rhythm.     Pulses: Normal pulses.     Heart sounds: Normal heart sounds. No murmur heard.   Pulmonary:     Effort: Pulmonary effort is normal. No respiratory distress.     Breath sounds: Normal breath sounds.  Abdominal:     Palpations: Abdomen is soft.     Tenderness: There is no abdominal tenderness.  Musculoskeletal:     Cervical back: Normal range of motion and neck supple.  Skin:    General: Skin is warm and dry.     Capillary Refill: Capillary refill takes less than 2 seconds.  Neurological:     General: No focal deficit present.     Mental Status: She is alert and oriented to person, place, and time.     Cranial Nerves: No cranial nerve deficit.     Sensory: No sensory deficit.     Motor: No weakness.     Coordination: Coordination normal.     Comments: 5+ out of 5 strength throughout, normal sensation, no drift, normal finger-nose-finger, normal speech  Psychiatric:        Mood and Affect: Mood normal.     ED Results / Procedures / Treatments   Labs (all labs ordered are listed, but only abnormal results are displayed) Labs Reviewed  CBC WITH DIFFERENTIAL/PLATELET - Abnormal; Notable for the following components:      Result Value   Hemoglobin 10.9 (*)    HCT 34.7 (*)    MCH 25.6 (*)    All other components within normal limits  BASIC METABOLIC PANEL - Abnormal; Notable for the following components:   Glucose, Bld 149 (*)    All other components within normal limits   HEPATIC FUNCTION PANEL - Abnormal; Notable for the following components:   AST 11 (*)    All other components within normal limits  URINALYSIS, ROUTINE W REFLEX MICROSCOPIC  PREGNANCY, URINE    EKG EKG Interpretation  Date/Time:  Wednesday December 22 2020 15:42:14 EDT Ventricular Rate:  89 PR Interval:  150  QRS Duration: 89 QT Interval:  381 QTC Calculation: 464 R Axis:   31 Text Interpretation: Sinus rhythm Probable left atrial enlargement Borderline T abnormalities, lateral leads Confirmed by Ronnald Nian, Adam ([id number]) on 12/22/2020 4:15:03 PM   Radiology No results found.  Procedures Procedures   Medications Ordered in ED Medications  sodium chloride 0.9 % bolus 1,000 mL (1,000 mLs Intravenous New Bag/Given 12/22/20 1549)  ondansetron (ZOFRAN) injection 4 mg (4 mg Intravenous Given 12/22/20 1551)    ED Course  I have reviewed the triage vital signs and the nursing notes.  Pertinent labs & imaging results that were available during my care of the patient were reviewed by me and considered in my medical decision making (see chart for details).    MDM Rules/Calculators/A&P                          Sandra Bishop is a 50 year old female history of diabetes who presents the ED with hyperglycemia.  Normal vitals.  No fever.  Patient with concerned about her diabetes coming back.  She was previously on diabetes medicine but her A1c improved.  She had noticed some diabetes type symptoms recently and had been checking her blood sugar at home and had been elevated in the 200s.  She called her primary care doctor who was concerned but no longer is in practice at this time.  Patient states that she started to take her old metformin yesterday.  She is neurologically intact despite having some intermittent dizziness.  No active dizziness now.  She does look a little bit dry on exam.  We will give her fluid bolus and evaluate for DKA.  Lab work overall unremarkable.  Blood sugar is 149.  No  significant electrolyte abnormality, acute kidney injury.  We will give her a new prescription for metformin.  She has follow-up with primary care doctor in 2 weeks.  Discharged in good condition.  This chart was dictated using voice recognition software.  Despite best efforts to proofread,  errors can occur which can change the documentation meaning.   Final Clinical Impression(s) / ED Diagnoses Final diagnoses:  Hyperglycemia    Rx / DC Orders ED Discharge Orders         Ordered    metFORMIN (GLUCOPHAGE) 500 MG tablet  2 times daily with meals        12/22/20 Lapel, Emmons, DO 12/22/20 1639

## 2021-01-05 ENCOUNTER — Other Ambulatory Visit: Payer: Self-pay | Admitting: Family Medicine

## 2021-01-05 DIAGNOSIS — Z1231 Encounter for screening mammogram for malignant neoplasm of breast: Secondary | ICD-10-CM

## 2021-01-11 ENCOUNTER — Inpatient Hospital Stay: Admission: RE | Admit: 2021-01-11 | Payer: BC Managed Care – PPO | Source: Ambulatory Visit

## 2021-01-14 ENCOUNTER — Ambulatory Visit
Admission: RE | Admit: 2021-01-14 | Discharge: 2021-01-14 | Disposition: A | Discharge status: Home / Self Care | Payer: BC Managed Care – PPO | Source: Ambulatory Visit | Attending: Family Medicine | Admitting: Family Medicine

## 2021-01-14 ENCOUNTER — Other Ambulatory Visit: Payer: Self-pay

## 2021-01-14 DIAGNOSIS — Z1231 Encounter for screening mammogram for malignant neoplasm of breast: Secondary | ICD-10-CM

## 2021-01-20 ENCOUNTER — Other Ambulatory Visit: Payer: Self-pay | Admitting: Family Medicine

## 2021-01-20 DIAGNOSIS — R928 Other abnormal and inconclusive findings on diagnostic imaging of breast: Secondary | ICD-10-CM

## 2021-02-08 ENCOUNTER — Ambulatory Visit
Admission: RE | Admit: 2021-02-08 | Discharge: 2021-02-08 | Disposition: A | Discharge status: Home / Self Care | Payer: BC Managed Care – PPO | Source: Ambulatory Visit | Attending: Family Medicine | Admitting: Family Medicine

## 2021-02-08 ENCOUNTER — Other Ambulatory Visit: Payer: Self-pay

## 2021-02-08 ENCOUNTER — Other Ambulatory Visit: Payer: Self-pay | Admitting: Family Medicine

## 2021-02-08 DIAGNOSIS — R599 Enlarged lymph nodes, unspecified: Secondary | ICD-10-CM

## 2021-02-08 DIAGNOSIS — N6310 Unspecified lump in the right breast, unspecified quadrant: Secondary | ICD-10-CM

## 2021-02-08 DIAGNOSIS — R928 Other abnormal and inconclusive findings on diagnostic imaging of breast: Secondary | ICD-10-CM

## 2021-02-08 IMAGING — MG MM DIGITAL DIAGNOSTIC UNILAT*R* W/ TOMO W/ CAD
4 series · 4 of 12 positions shown · non-contrast
Comparison: Previous exam(s).

CLINICAL DATA: Patient recalled from screening for right breast
mass.

EXAM:
DIGITAL DIAGNOSTIC UNILATERAL RIGHT MAMMOGRAM WITH TOMOSYNTHESIS AND
CAD; ULTRASOUND RIGHT BREAST LIMITED
TECHNIQUE: Right digital diagnostic mammography and breast tomosynthesis was
performed. The images were evaluated with computer-aided detection.;
Targeted ultrasound examination of the right breast was performed

[R CC synth-2D]
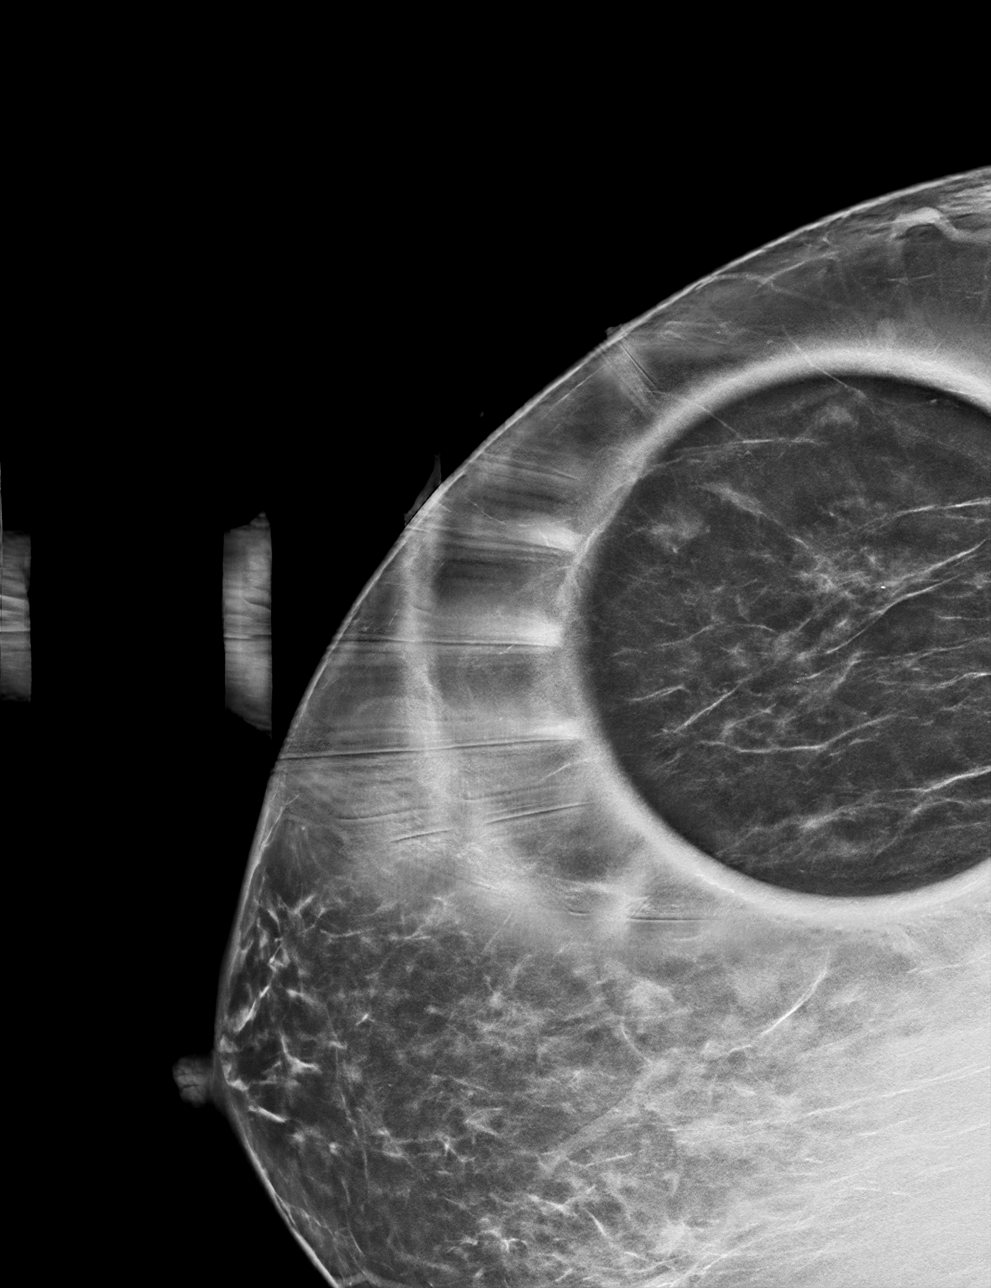

[R MLO synth-2D]
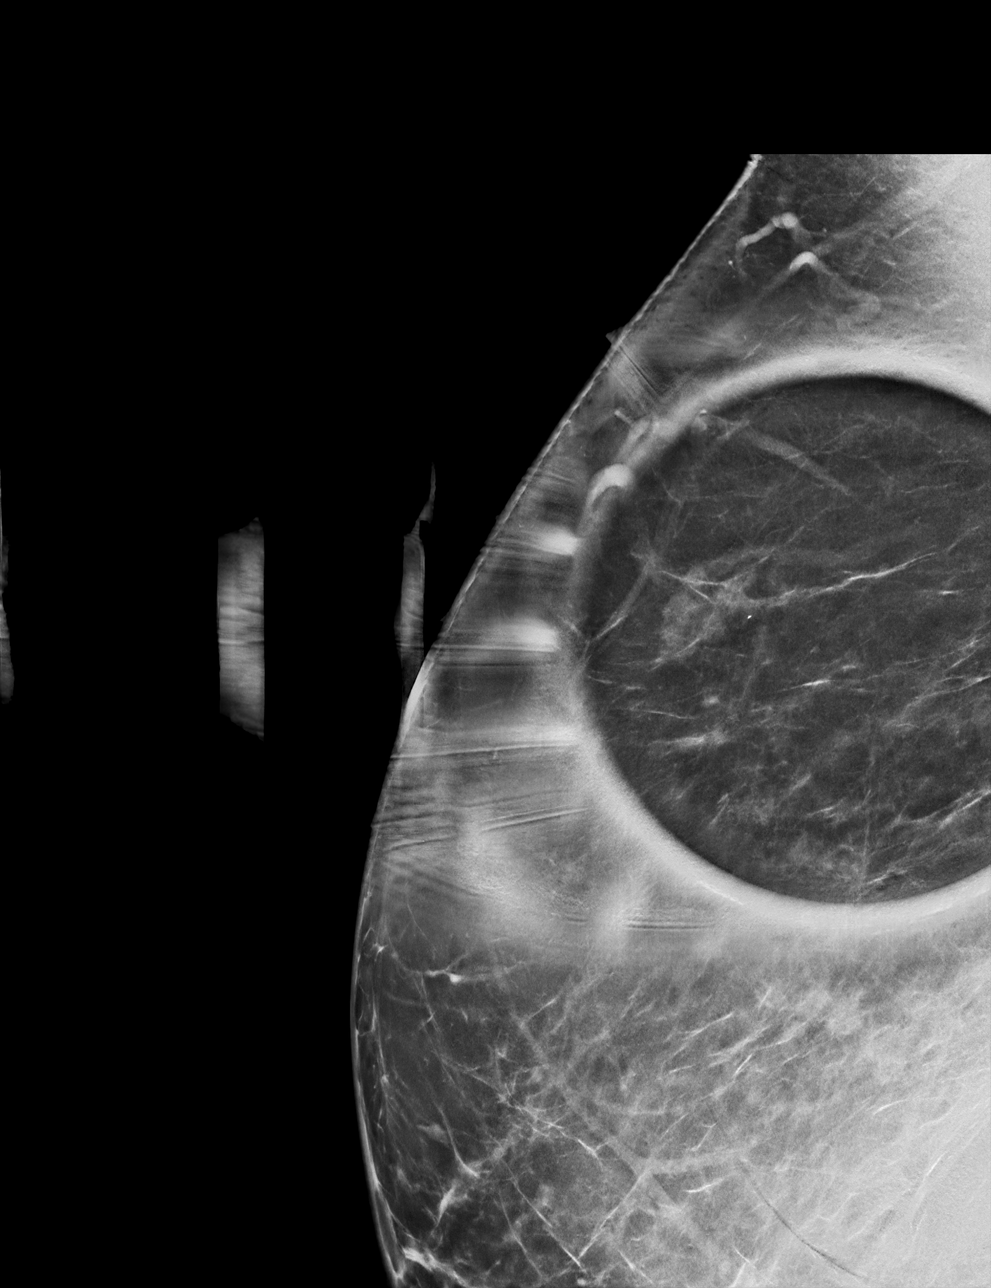

[R MLO tomo · tomo slice 43/85.0]
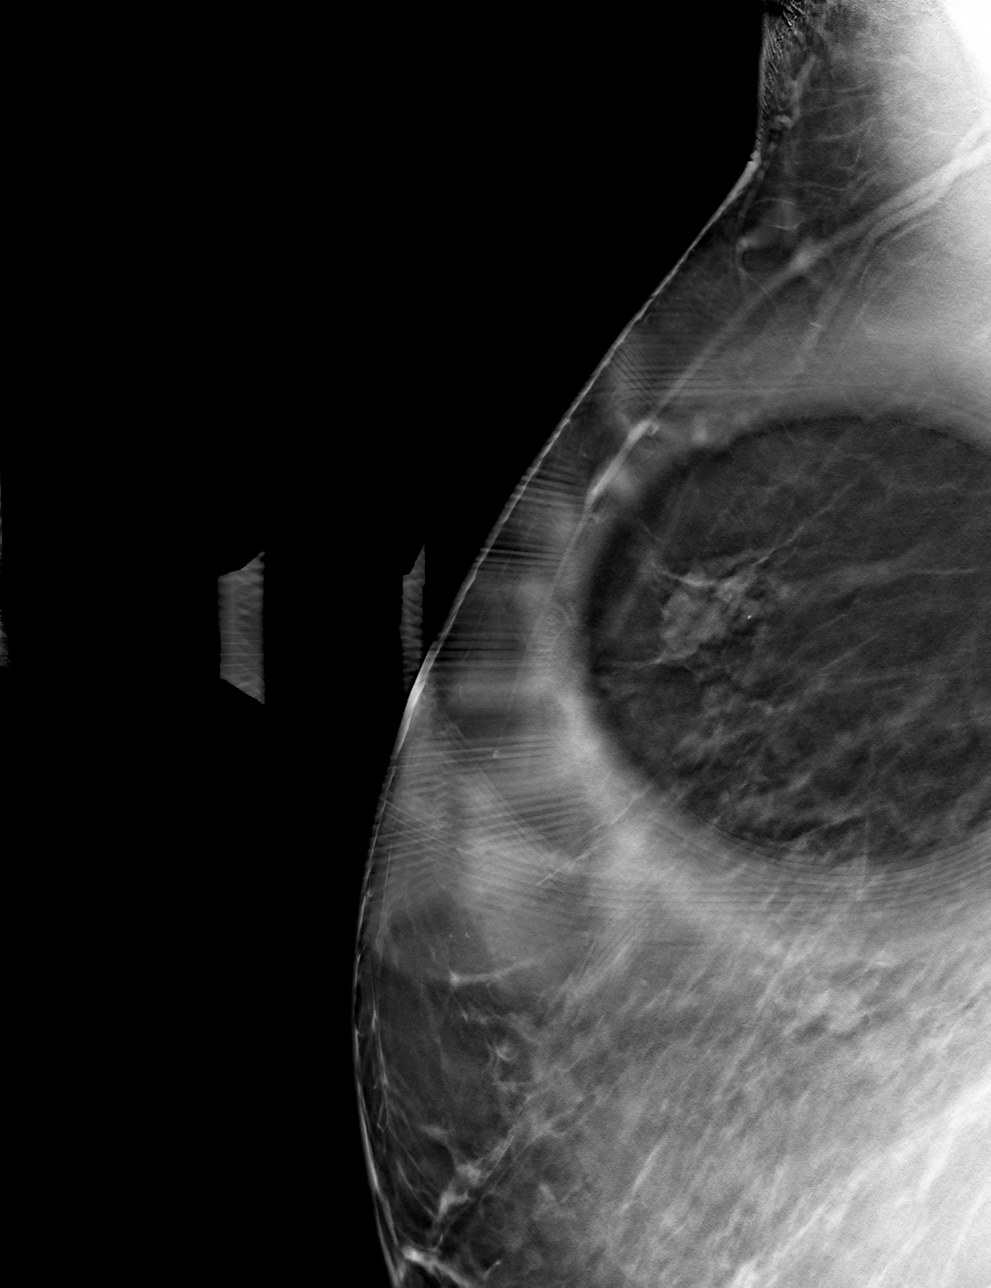

[R CC tomo · tomo slice 40/79.0]
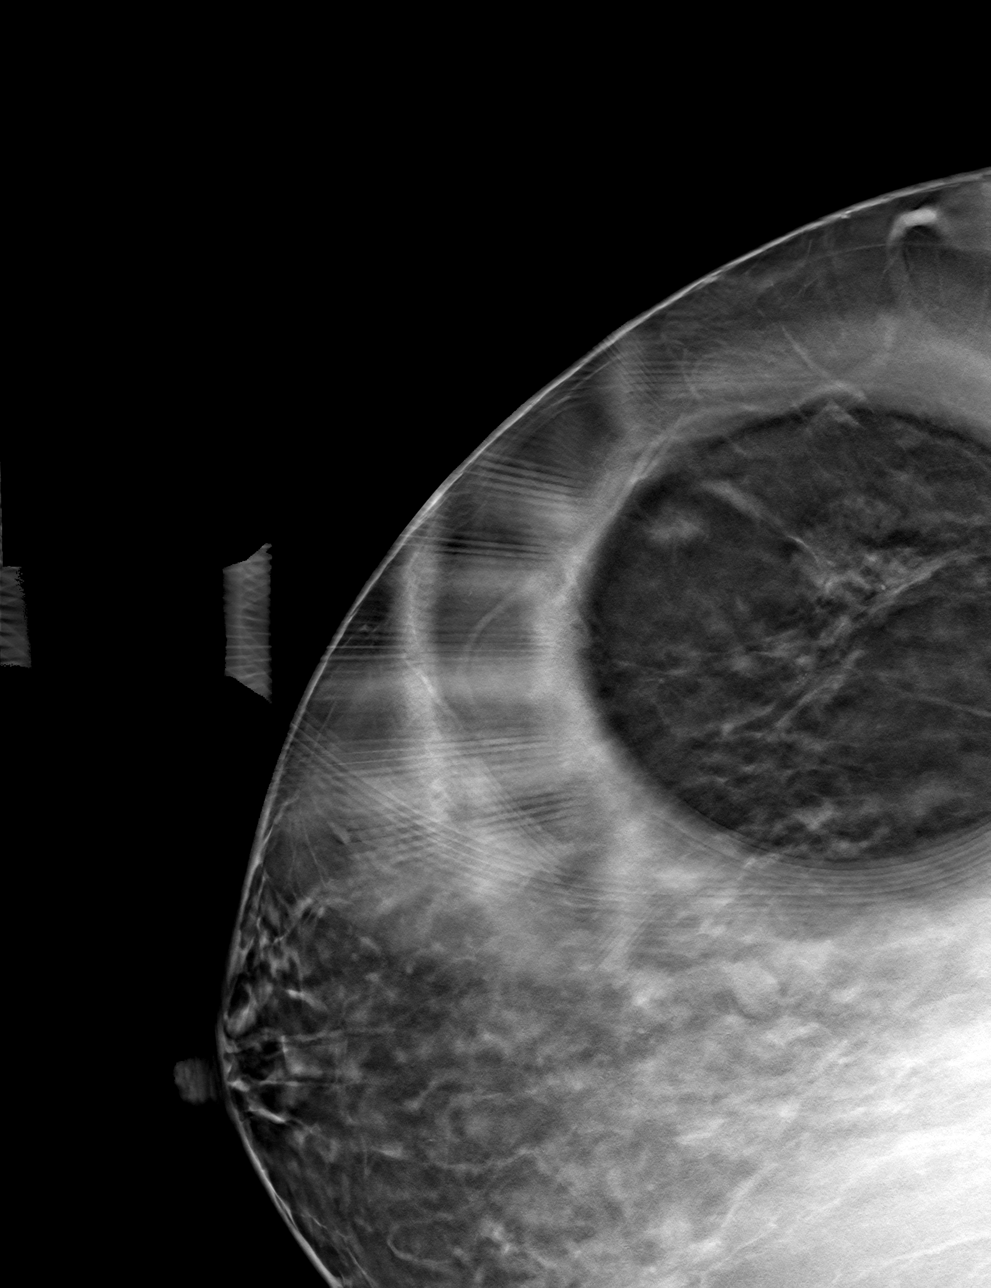

[4 of 12 positions shown; findings below may reference images not displayed]

ACR Breast Density Category c: The breast tissue is heterogeneously
dense, which may obscure small masses.
FINDINGS: There is a persistent irregular mass within the upper-outer right
breast, further evaluated with spot compression views.

Targeted ultrasound is performed, showing a 1.1 x 1.2 x 0.8 cm
irregular hypoechoic mass right breast 10 o'clock position 12 cm
from nipple.

There is a cortically thickened right axillary lymph node measuring
9 mm.
IMPRESSION: Suspicious right breast mass 10 o'clock position.

Cortically thickened right axillary lymph node.

RECOMMENDATION:
Ultrasound-guided core needle biopsy right breast mass 10 o'clock
position.

Ultrasound-guided core needle biopsy cortically thickened right
axillary lymph node.

I have discussed the findings and recommendations with the patient.
If applicable, a reminder letter will be sent to the patient
regarding the next appointment.

BI-RADS CATEGORY  4: Suspicious.

## 2021-02-08 IMAGING — US US BREAST*R* LIMITED INC AXILLA
1 series · 13 of 14 positions shown · non-contrast
Comparison: Previous exam(s).

CLINICAL DATA: Patient recalled from screening for right breast
mass.

EXAM:
DIGITAL DIAGNOSTIC UNILATERAL RIGHT MAMMOGRAM WITH TOMOSYNTHESIS AND
CAD; ULTRASOUND RIGHT BREAST LIMITED
TECHNIQUE: Right digital diagnostic mammography and breast tomosynthesis was
performed. The images were evaluated with computer-aided detection.;
Targeted ultrasound examination of the right breast was performed

[Series 1: us breast*right* limited inc axilla · 0.08mm/px · 13 of 14 slices shown]
[im 1/14]
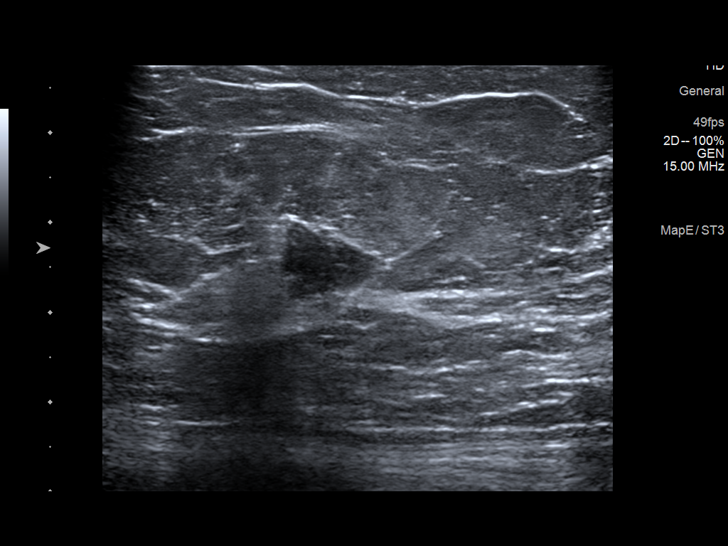
[im 2/14]
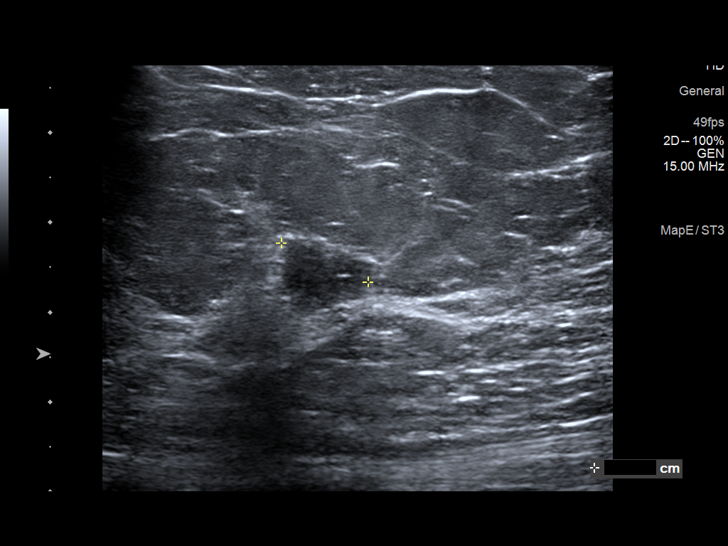
[im 3/14]
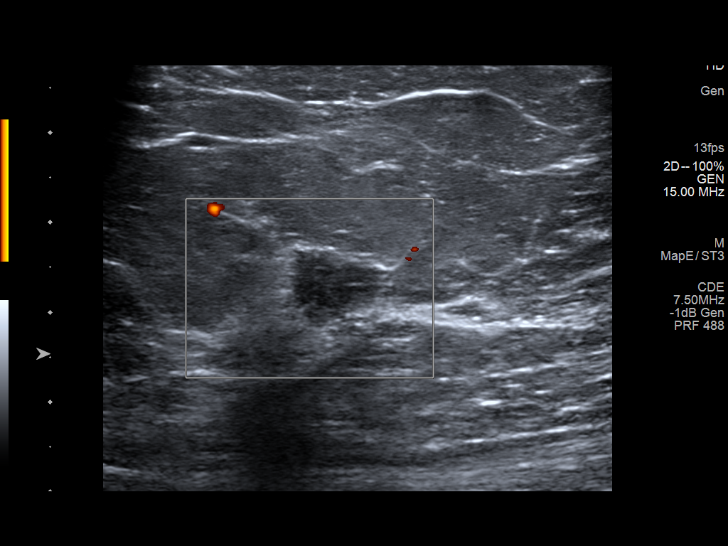
[im 4/14]
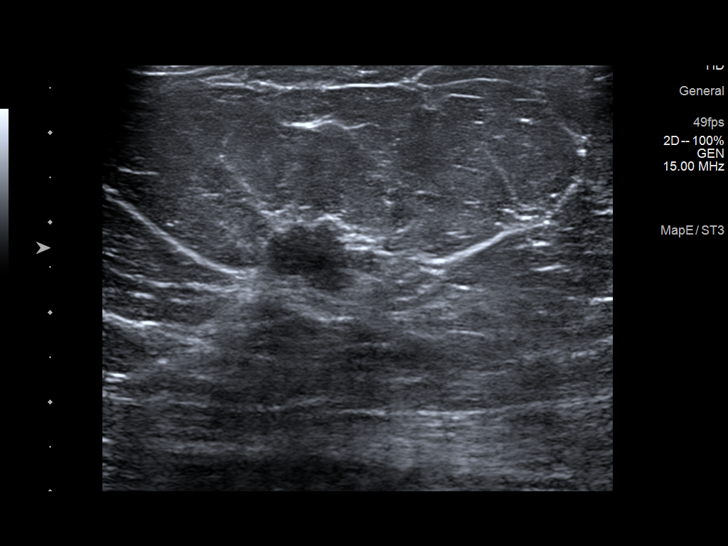
[im 5/14]
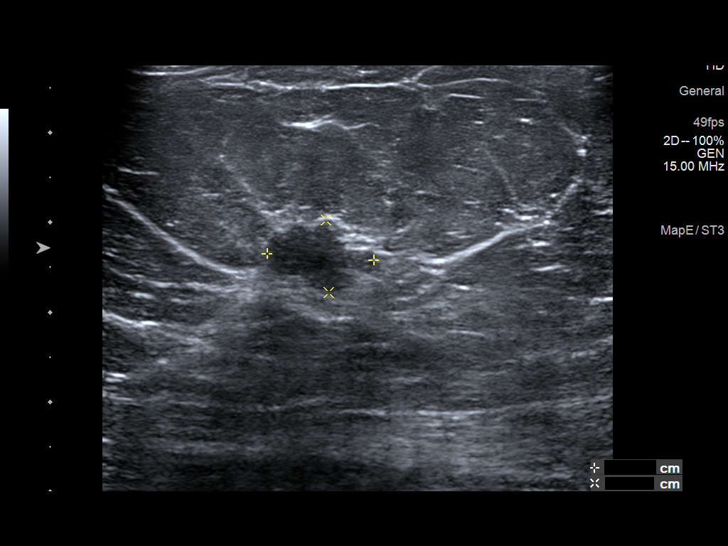
[im 6/14]
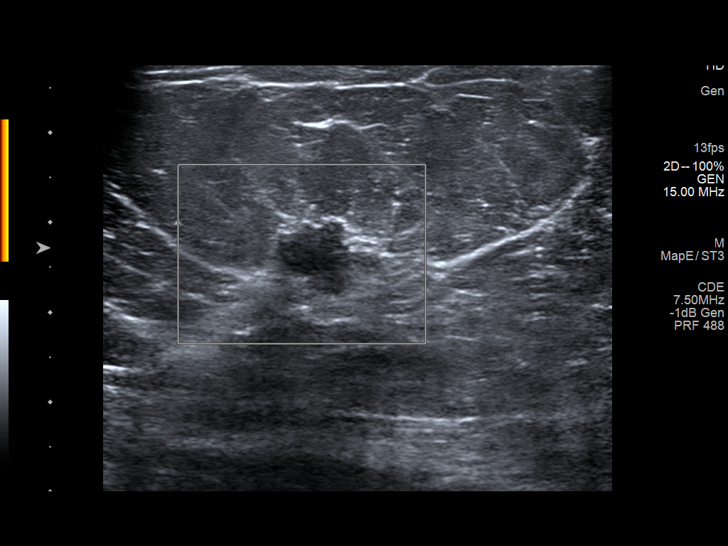
[im 8/14]
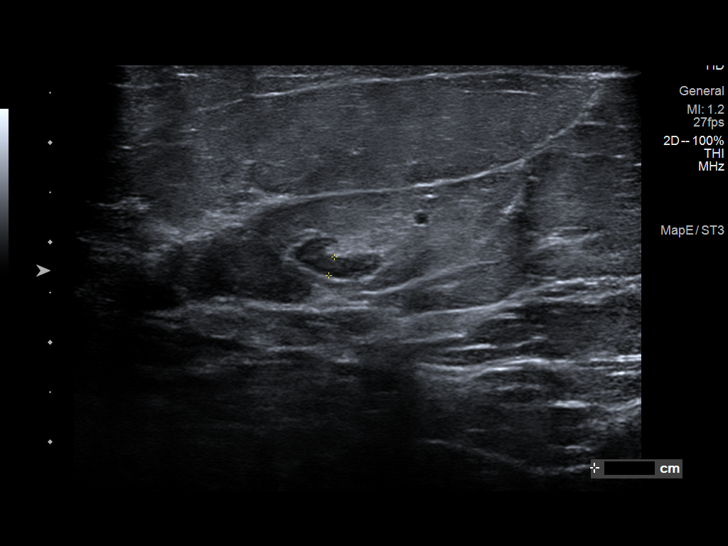
[im 9/14]
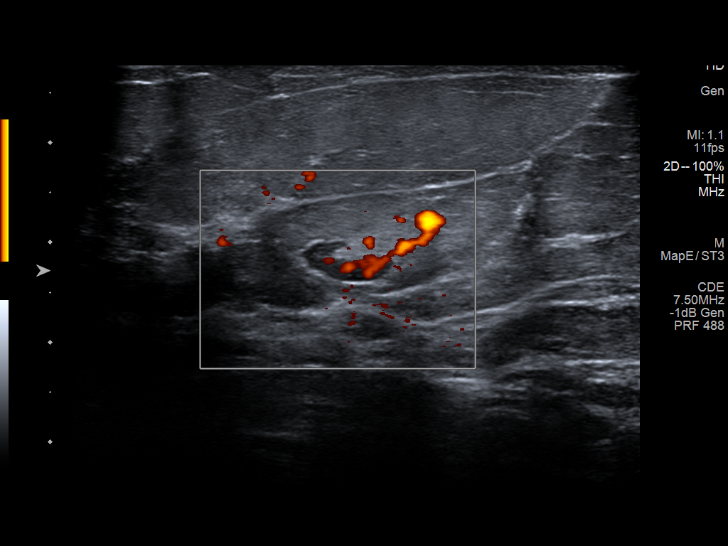
[im 10/14]
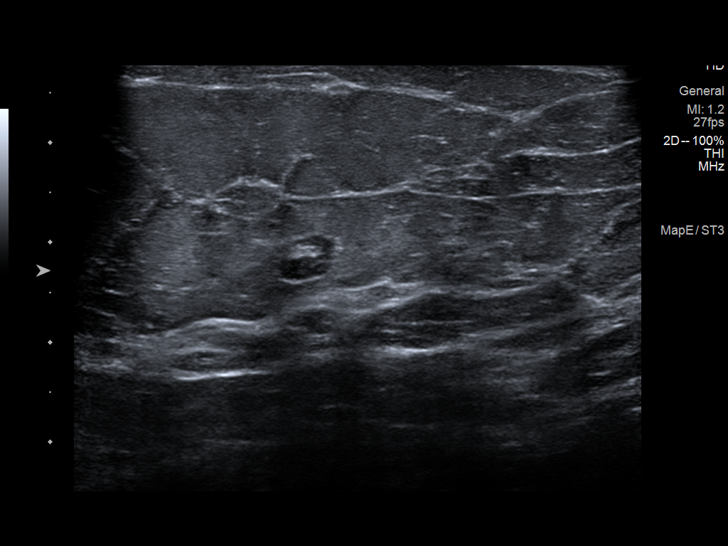
[im 11/14]
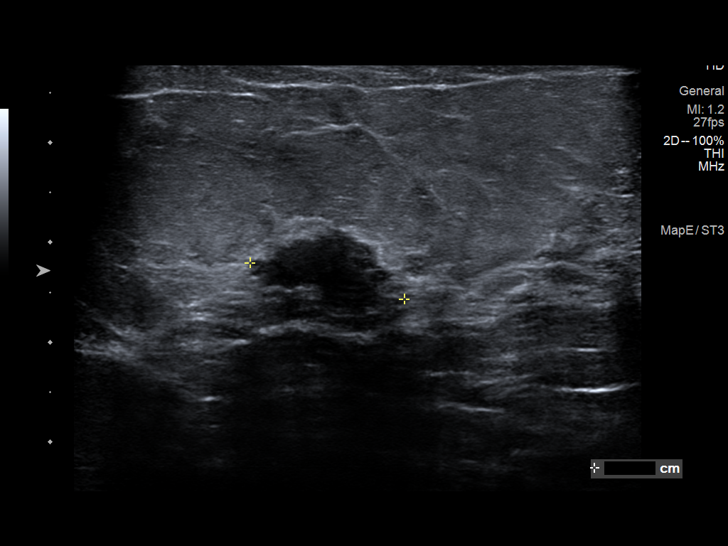
[im 12/14]
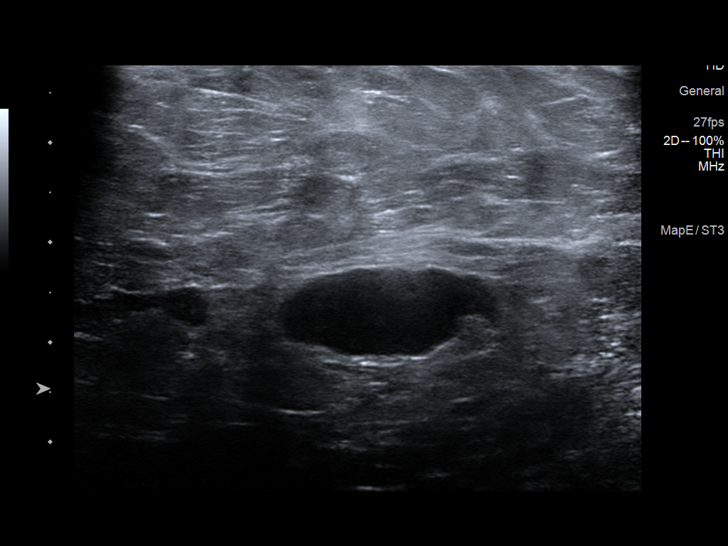
[im 13/14]
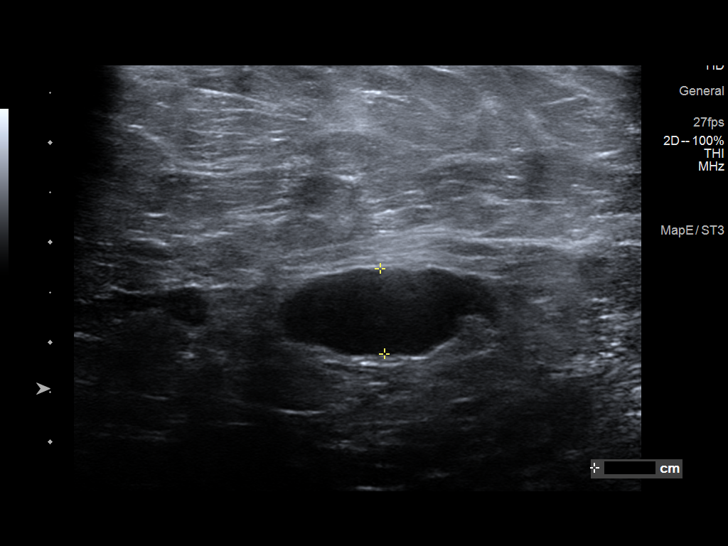
[im 14/14]
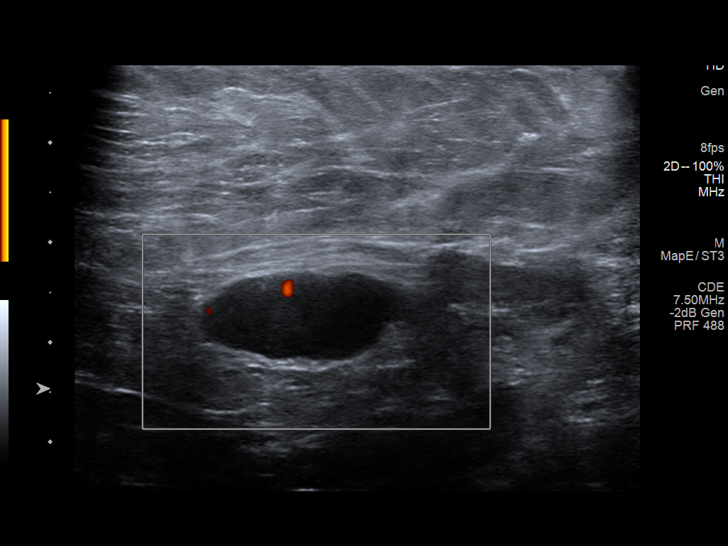

[13 of 14 positions shown; findings below may reference images not displayed]

ACR Breast Density Category c: The breast tissue is heterogeneously
dense, which may obscure small masses.
FINDINGS: There is a persistent irregular mass within the upper-outer right
breast, further evaluated with spot compression views.

Targeted ultrasound is performed, showing a 1.1 x 1.2 x 0.8 cm
irregular hypoechoic mass right breast 10 o'clock position 12 cm
from nipple.

There is a cortically thickened right axillary lymph node measuring
9 mm.
IMPRESSION: Suspicious right breast mass 10 o'clock position.

Cortically thickened right axillary lymph node.

RECOMMENDATION:
Ultrasound-guided core needle biopsy right breast mass 10 o'clock
position.

Ultrasound-guided core needle biopsy cortically thickened right
axillary lymph node.

I have discussed the findings and recommendations with the patient.
If applicable, a reminder letter will be sent to the patient
regarding the next appointment.

BI-RADS CATEGORY  4: Suspicious.

## 2021-02-11 ENCOUNTER — Ambulatory Visit
Admission: RE | Admit: 2021-02-11 | Discharge: 2021-02-11 | Disposition: A | Discharge status: Home / Self Care | Payer: BC Managed Care – PPO | Source: Ambulatory Visit | Attending: Family Medicine | Admitting: Family Medicine

## 2021-02-11 ENCOUNTER — Other Ambulatory Visit: Payer: Self-pay

## 2021-02-11 DIAGNOSIS — N6310 Unspecified lump in the right breast, unspecified quadrant: Secondary | ICD-10-CM

## 2021-02-11 DIAGNOSIS — R599 Enlarged lymph nodes, unspecified: Secondary | ICD-10-CM

## 2021-02-11 HISTORY — PX: BREAST BIOPSY: SHX20

## 2021-02-11 IMAGING — US US  BREAST BX W/ LOC DEV 1ST LESION IMG BX SPEC US GUIDE*R*
1 series · 16 of 25 positions shown · non-contrast
Comparison: Previous exam(s).
COMPARISON: Previous exam(s).

Addendum:
CLINICAL DATA: Suspicious right breast mass and axillary
adenopathy.

EXAM:
ULTRASOUND GUIDED RIGHT BREAST AND RIGHT AXILLA

[Series 1: us breast bx w/ loc dev 1st lesion img bx spec us  · 0.07mm/px · 16 of 25 slices shown]
[im 1/25]
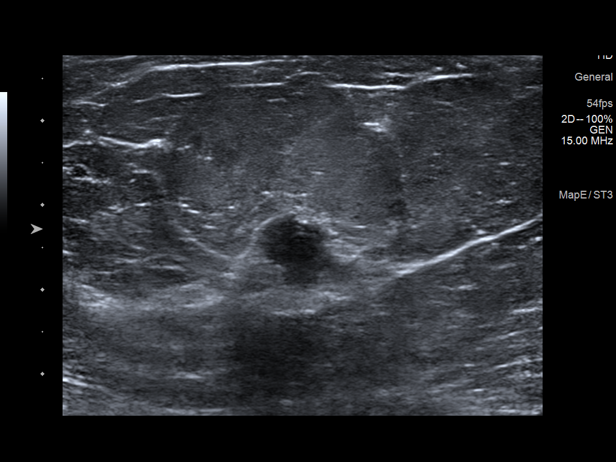
[im 3/25]
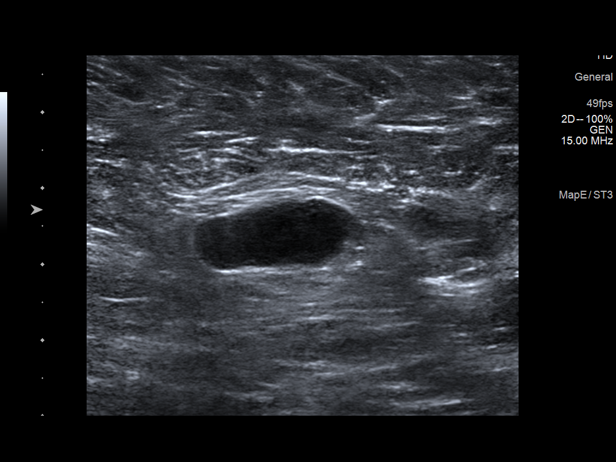
[im 4/25]
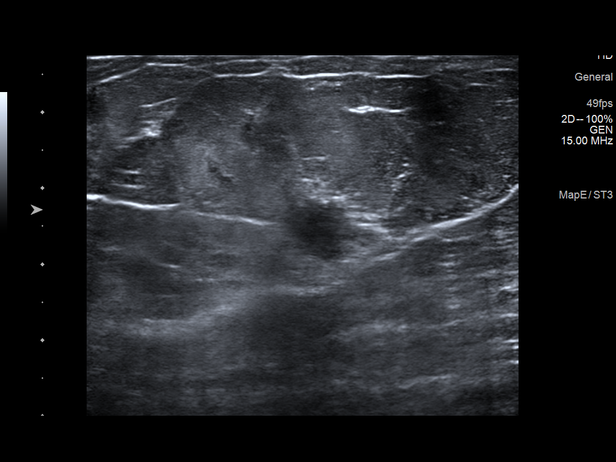
[im 6/25]
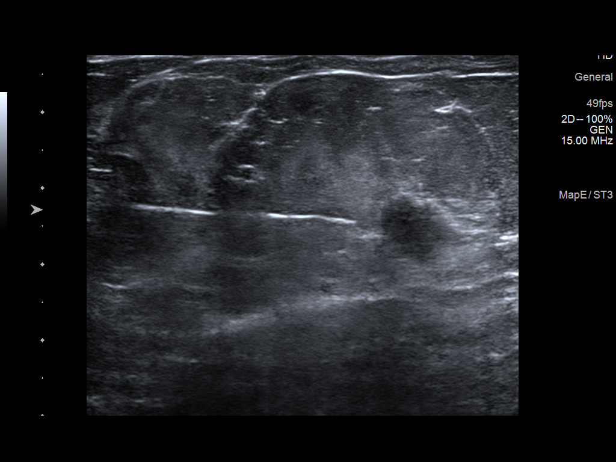
[im 8/25]
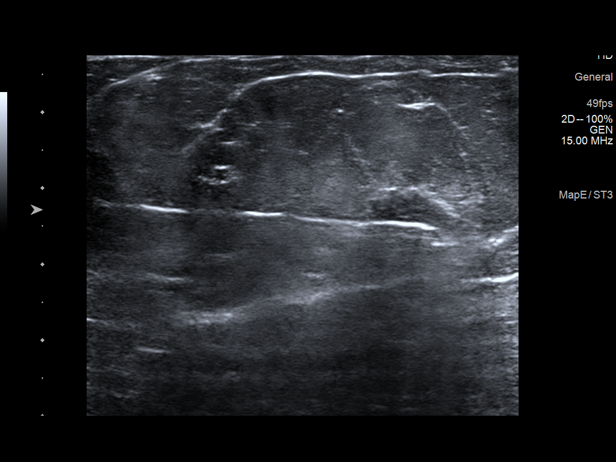
[im 9/25]
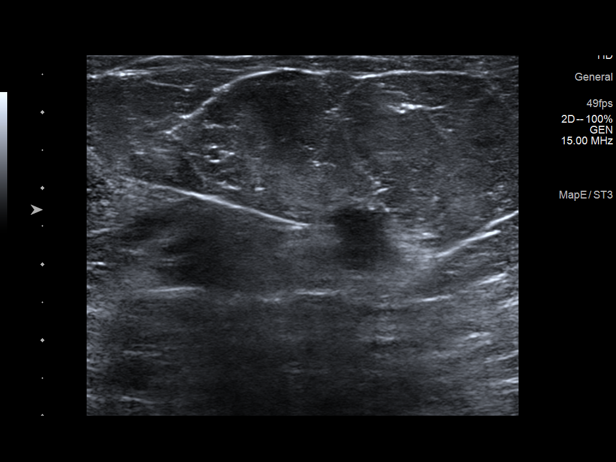
[im 11/25]
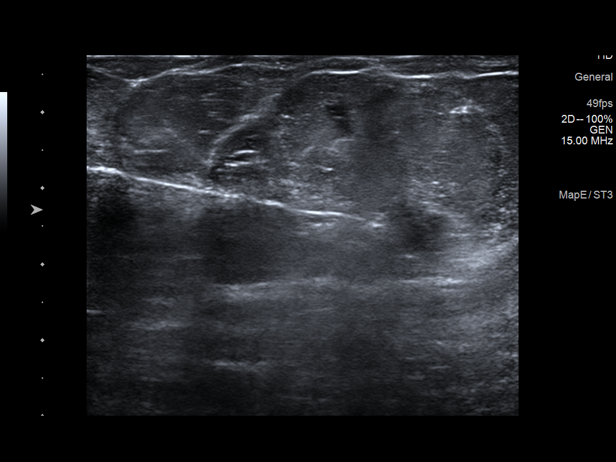
[im 12/25]
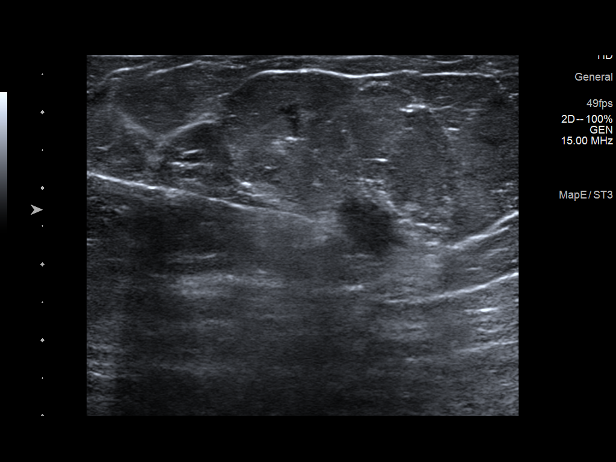
[im 14/25]
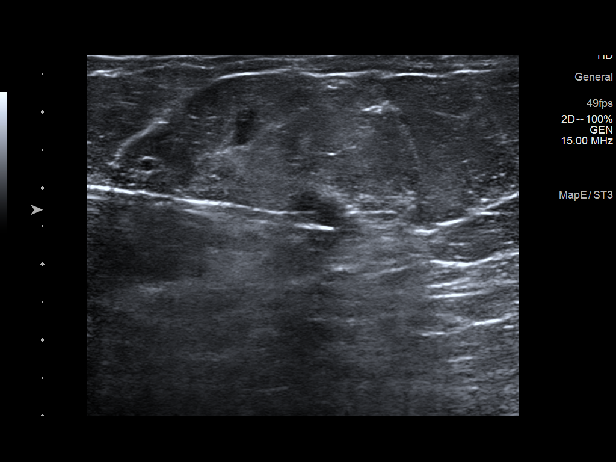
[im 15/25]
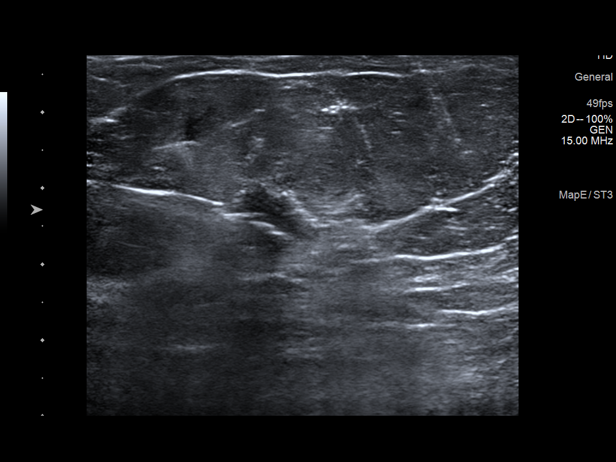
[im 17/25]
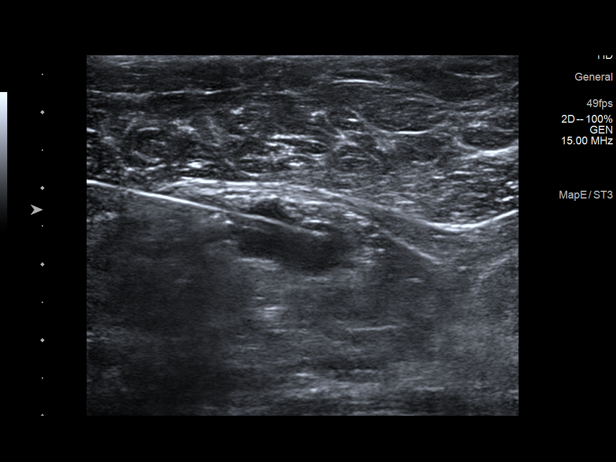
[im 18/25]
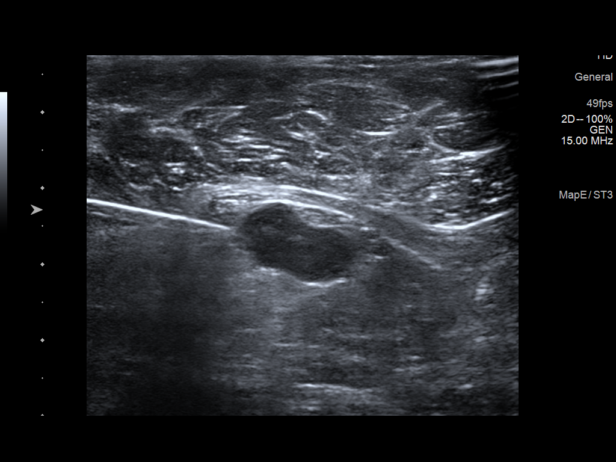
[im 20/25]
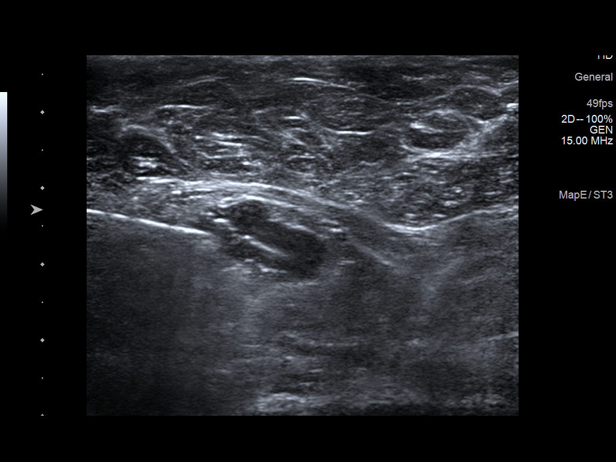
[im 22/25]
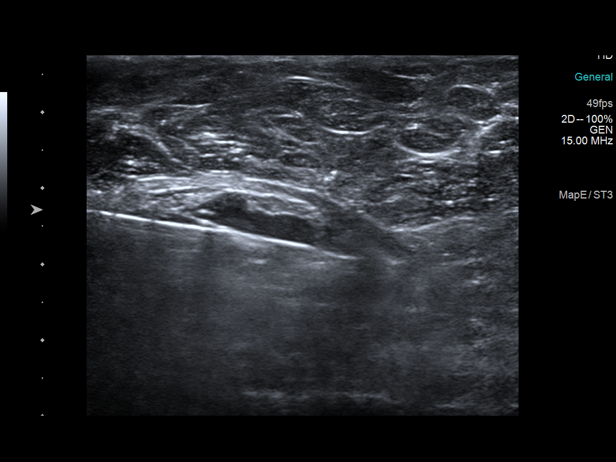
[im 23/25]
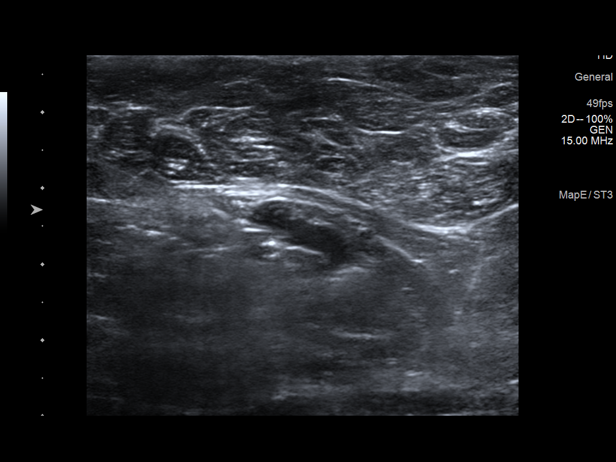
[im 25/25]
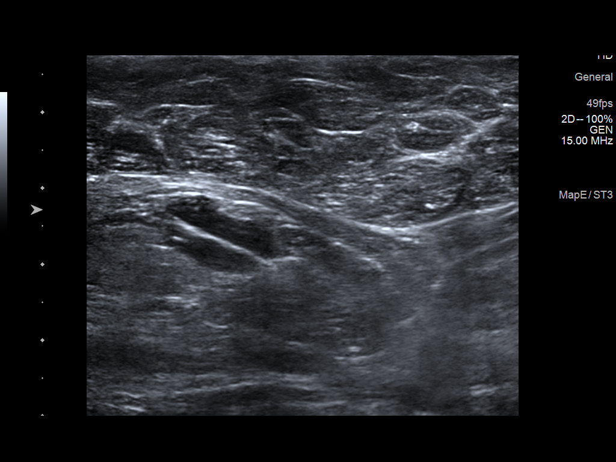

[16 of 25 positions shown; findings below may reference images not displayed]



Lesion quadrant: UPPER-OUTER QUADRANT

Using sterile technique and 1% Lidocaine as local anesthetic, under
direct ultrasound visualization, a 14 gauge KAKI device was
used to perform biopsy of a mass in the 10 o'clock region of the
right breast using a lateral to medial approach. At the conclusion
of the procedure ribbon shaped tissue marker clip was deployed into
the biopsy cavity. Follow up 2 view mammogram was performed and
dictated separately.

I met with the patient and we discussed the procedure of
ultrasound-guided biopsy, including benefits and alternatives. We
discussed the high likelihood of a successful procedure. We
discussed the risks of the procedure, including infection, bleeding,
tissue injury, clip migration, and inadequate sampling. Informed
written consent was given. The usual time-out protocol was performed
immediately prior to the procedure.

Lesion quadrant: RIGHT AXILLA

Using sterile technique and 1% Lidocaine and 2% lidocaine with
epinephrine as local anesthetic, under direct ultrasound
visualization, a 14 gauge KAKI device was used to perform
biopsy of a right axillary lymph node using a lateral to medial
approach. At the conclusion of the procedure tribell tissue marker
clip was deployed into the biopsy cavity. Follow up 2 view mammogram
was performed and dictated separately.
IMPRESSION: Ultrasound guided biopsies of the right breast and right axilla. No
apparent complications.

ADDENDUM:
Pathology revealed GRADE III INVASIVE DUCTAL CARCINOMA of the Right
breast, 10 o'clock, (ribbon clip). This was found to be concordant
by Dr. KAKI.

Pathology revealed METASTATIC CARCINOMA INVOLVING A LYMPH NODE of
the Right axilla, (tribell clip). This was found to be concordant by
Dr. KAKI.

Pathology results were discussed with the patient by telephone. The
patient reported doing well after the biopsies with tenderness at
the sites. Post biopsy instructions and care were reviewed and
questions were answered. The patient was encouraged to call The
direct phone number was provided.

The patient was referred to [REDACTED]
[REDACTED] at [REDACTED] on
[DATE].

Pathology results reported by KAKI, RN on [DATE].



Lesion quadrant: UPPER-OUTER QUADRANT

Using sterile technique and 1% Lidocaine as local anesthetic, under
direct ultrasound visualization, a 14 gauge KAKI device was
used to perform biopsy of a mass in the 10 o'clock region of the
right breast using a lateral to medial approach. At the conclusion
of the procedure ribbon shaped tissue marker clip was deployed into
the biopsy cavity. Follow up 2 view mammogram was performed and
dictated separately.

I met with the patient and we discussed the procedure of
ultrasound-guided biopsy, including benefits and alternatives. We
discussed the high likelihood of a successful procedure. We
discussed the risks of the procedure, including infection, bleeding,
tissue injury, clip migration, and inadequate sampling. Informed
written consent was given. The usual time-out protocol was performed
immediately prior to the procedure.

Lesion quadrant: RIGHT AXILLA

Using sterile technique and 1% Lidocaine and 2% lidocaine with
epinephrine as local anesthetic, under direct ultrasound
visualization, a 14 gauge KAKI device was used to perform
biopsy of a right axillary lymph node using a lateral to medial
approach. At the conclusion of the procedure tribell tissue marker
clip was deployed into the biopsy cavity. Follow up 2 view mammogram
was performed and dictated separately.
IMPRESSION: Ultrasound guided biopsies of the right breast and right axilla. No
apparent complications.

## 2021-02-11 IMAGING — MG MM BREAST LOCALIZATION CLIP
4 series · 4 of 12 positions shown · non-contrast
Comparison: Previous exam(s).

CLINICAL DATA: Status post ultrasound-guided core biopsies of a
mass in the 10 o'clock region of the right breast and a right
axillary lymph node.

EXAM:
3D DIAGNOSTIC RIGHT MAMMOGRAM POST ULTRASOUND BIOPSIES

[R ML synth-2D]
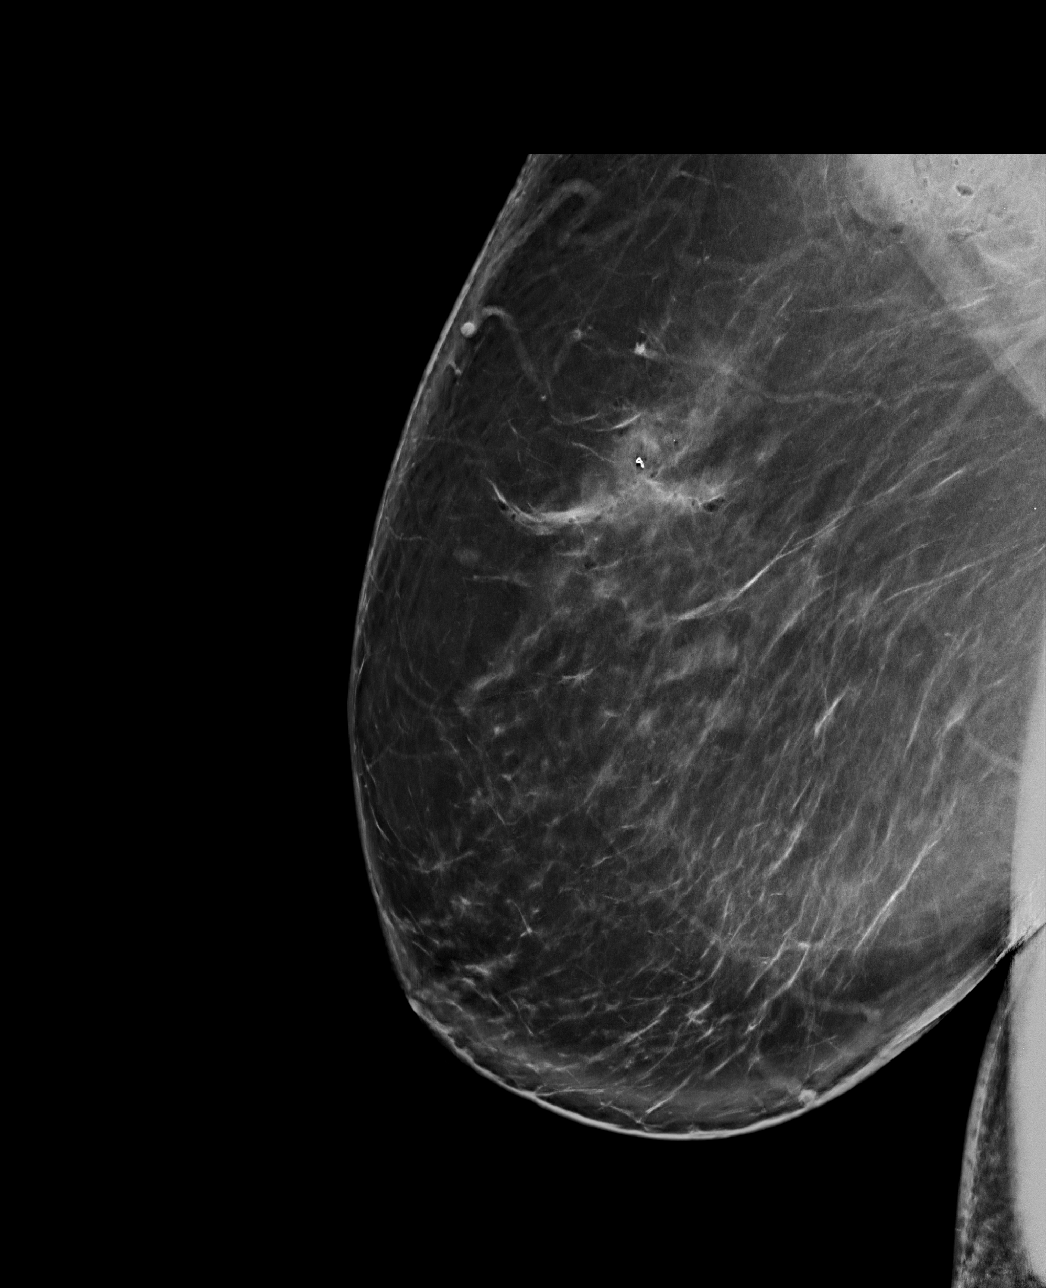

[R CC synth-2D]
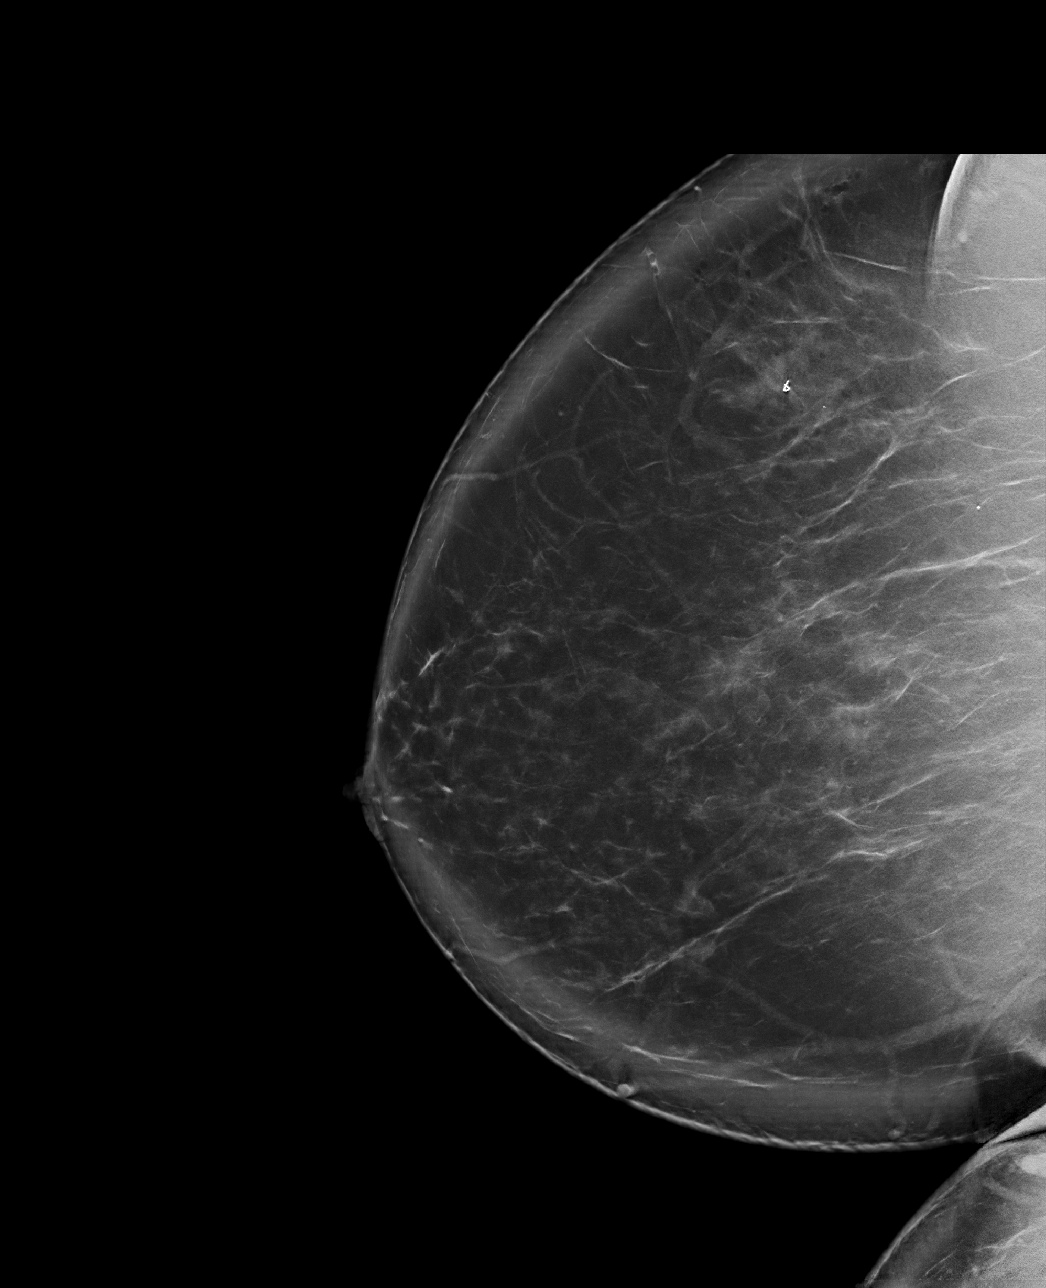

[R CC tomo · tomo slice 61/122.0]
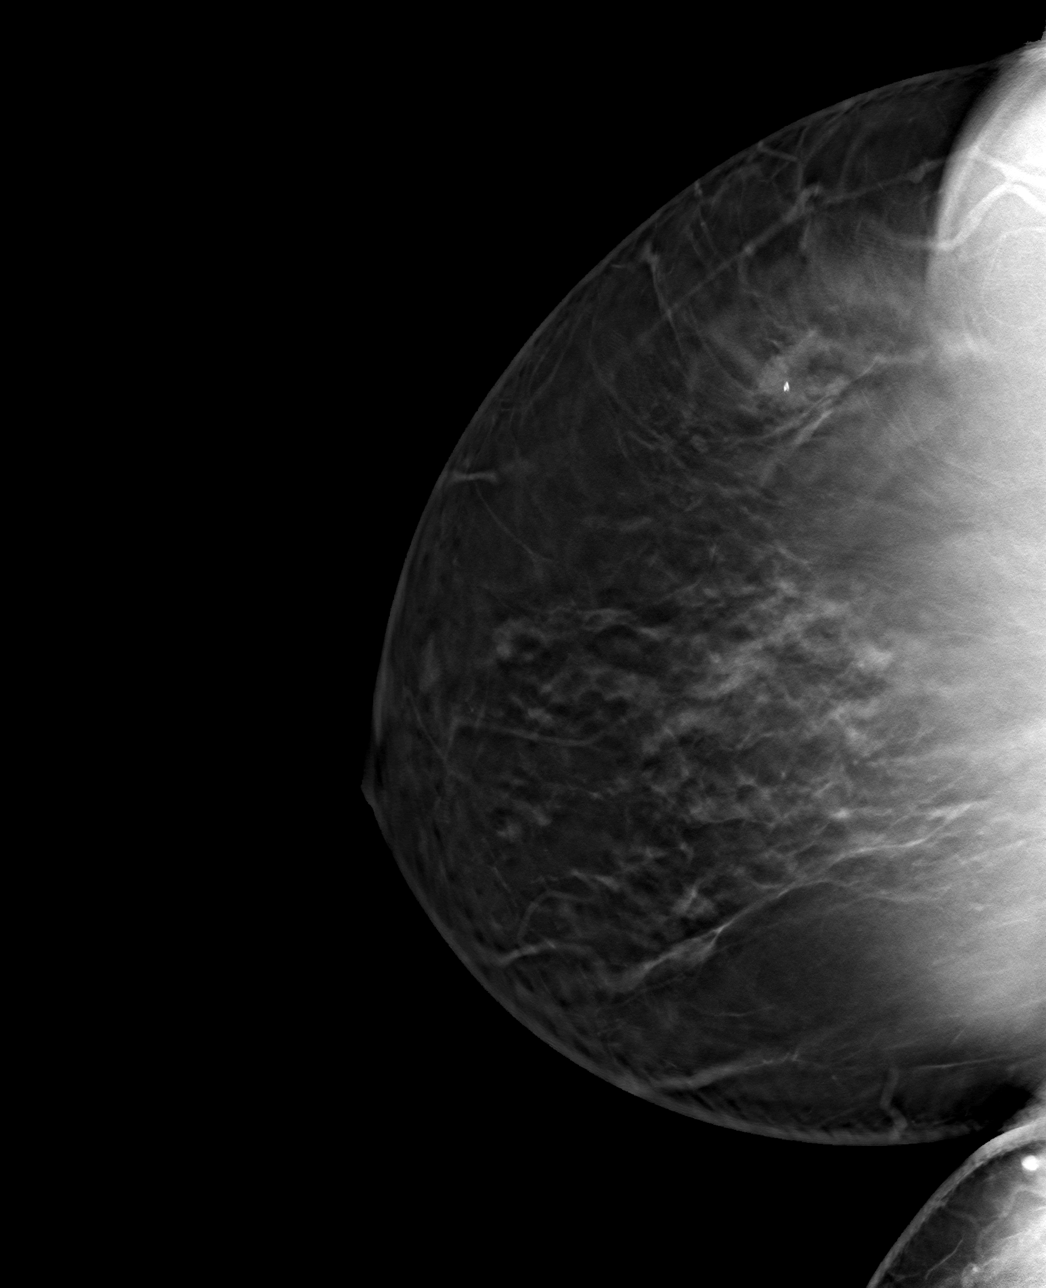

[R ML tomo · tomo slice 57/112.0]
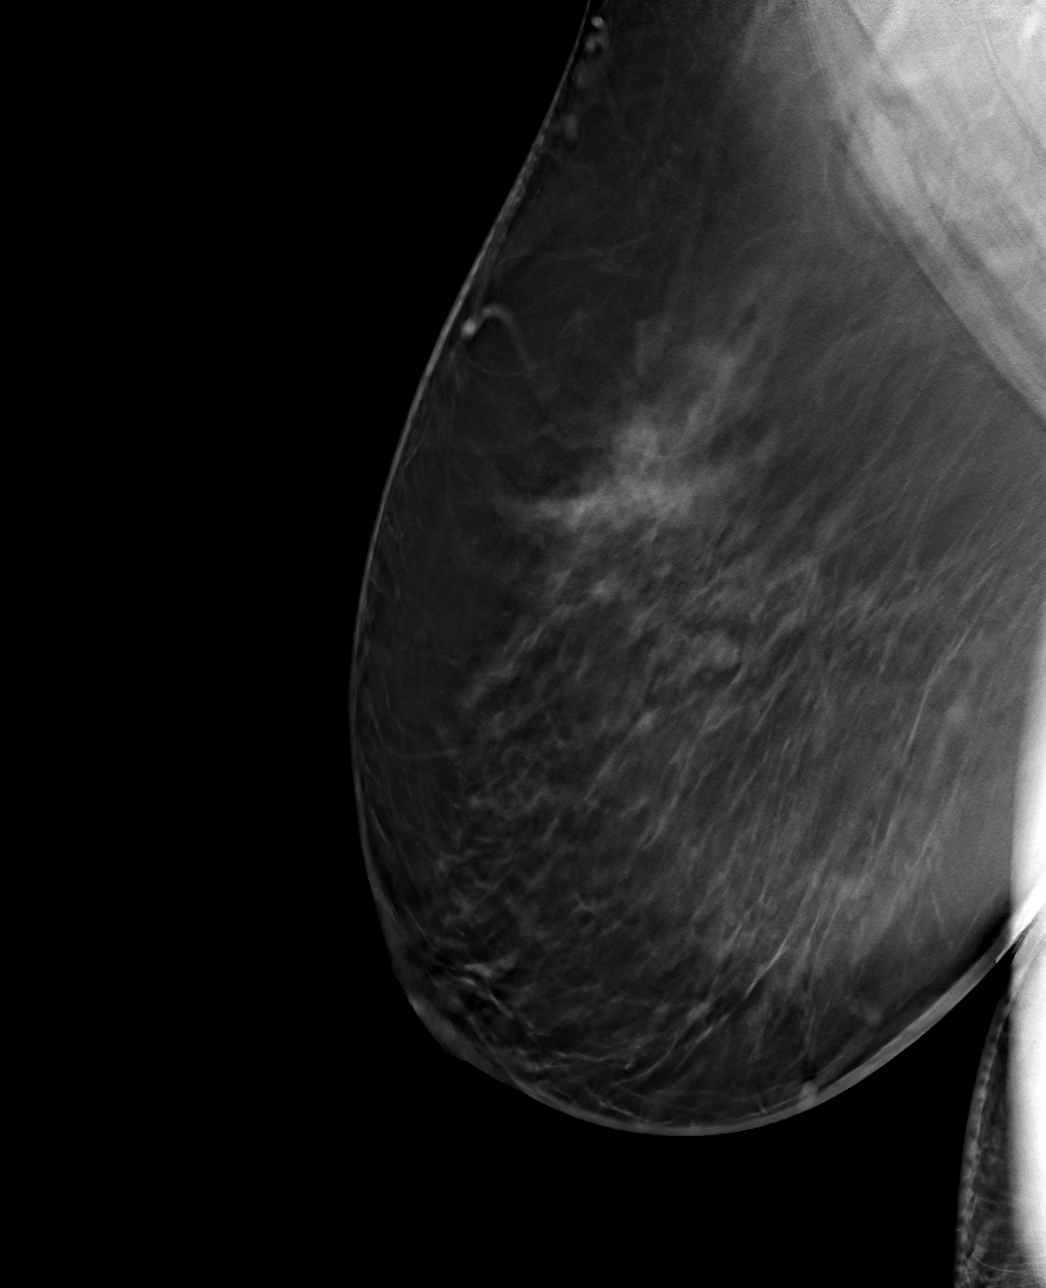

[4 of 12 positions shown; findings below may reference images not displayed]

FINDINGS: 3D Mammographic images were obtained following ultrasound guided
biopsies of a mass in the 10 o'clock region of the right breast and
a right axillary lymph node. There is a ribbon shaped clip in
appropriate position in the upper-outer quadrant of the right breast
and a tribell clip in the right axilla.
IMPRESSION: Appropriate positioning of the ribbon shaped clip in the upper-outer
quadrant of the right breast and tribell clip in the right axilla.

Final Assessment: Post Procedure Mammograms for Marker Placement

## 2021-02-15 ENCOUNTER — Encounter: Payer: Self-pay | Admitting: *Deleted

## 2021-02-15 ENCOUNTER — Telehealth: Payer: Self-pay | Admitting: *Deleted

## 2021-02-15 DIAGNOSIS — Z170 Estrogen receptor positive status [ER+]: Secondary | ICD-10-CM | POA: Insufficient documentation

## 2021-02-15 DIAGNOSIS — C50411 Malignant neoplasm of upper-outer quadrant of right female breast: Secondary | ICD-10-CM | POA: Insufficient documentation

## 2021-02-15 NOTE — Telephone Encounter (Signed)
Spoke with patient to confirm [facility] apt for 02/23/21 at 12:15pm. Instructions and contact information given.

## 2021-02-21 ENCOUNTER — Encounter: Payer: Self-pay | Admitting: *Deleted

## 2021-02-22 NOTE — Progress Notes (Signed)
[place] CONSULT NOTE  Patient Care Team: Forrest Moron, MD as PCP - General (Internal Medicine) [name], RN as Oncology Nurse Navigator [name], RN as Oncology Nurse Navigator [name], MD as Consulting Physician (General Surgery) [name], MD as Consulting Physician (Hematology and Oncology) [name], MD as Consulting Physician (Radiation Oncology)  CHIEF COMPLAINTS/PURPOSE OF CONSULTATION:  Newly diagnosed right breast cancer  HISTORY OF PRESENTING ILLNESS:  Sandra Bishop 50 y.o. female is here because of recent diagnosis of invasive ductal carcinoma of the right breast. Screening mammogram on 01/14/21 showed a possible mass in the right breast and no findings of malignancy in the left breast. Diagnostic mammogram and [place] on 02/08/21 showed a persistent irregular mass within the upper-outer right breast. 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 She presents to the clinic today for initial evaluation and discussion of treatment options.   I reviewed her records extensively and collaborated the history with the patient.  SUMMARY OF ONCOLOGIC HISTORY: Oncology History  Malignant neoplasm of upper-outer quadrant of right breast in female, estrogen receptor positive ([place])  02/15/2021 Initial Diagnosis   Screening mammogram: a possible mass in the right breast  Diagnostic mammogram and [place]: a persistent irregular mass 1.2 cm within the upper-outer right breast. 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    02/23/2021 Cancer Staging   Staging form: Breast, AJCC 8th Edition - Clinical stage from 02/23/2021: Stage IIB (cT1c, cN1(f), cM0, G3, ER+, PR-, HER2-) - Signed by [name], MD on 02/23/2021  Stage prefix: Initial diagnosis  Method of lymph node assessment: Core  biopsy  Histologic grading system: 3 grade system      MEDICAL HISTORY:  Past Medical History:  Diagnosis Date   Allergy    Anemia    Breast cancer ([place])    Constipation    Diabetes mellitus without complication ([place])    Obesity     SURGICAL HISTORY: Past Surgical History:  Procedure Laterality Date   EYE SURGERY     TUBAL LIGATION      SOCIAL HISTORY: Social History   Socioeconomic History   Marital status: Divorced    Spouse name: Not on file   Number of children: 2   Years of education: Not on file   Highest education level: Not on file  Occupational History   Not on file  Tobacco Use   Smoking status: Former    Types: Cigarettes    Quit date: 12/28/2010    Years since quitting: 10.1   Smokeless tobacco: Never   Tobacco comments:    quit in 2012, previously smoked 15 years  Vaping Use   Vaping Use: Not on file  Substance and Sexual Activity   Alcohol use: Not Currently    Comment: occ   Drug use: No   Sexual activity: Yes    Birth control/protection: None  Other Topics Concern   Not on file  Social History Narrative   Not on file   Social Determinants of Health   Financial Resource Strain: Not on file  Food Insecurity: Not on file  Transportation Needs: Not on file  Physical Activity: Not on file  Stress: Not on file  Social Connections: Not on file  Intimate Partner Violence: Not on file    FAMILY HISTORY: Family History  Problem Relation Age of Onset  Hypertension Mother    Pancreatic cancer Father 90   Breast cancer Sister    Colon cancer Maternal Uncle    Heart failure Maternal Grandmother     ALLERGIES:  has No Known Allergies.  MEDICATIONS:  Current Outpatient Medications  Medication Sig Dispense Refill   aspirin 81 MG chewable tablet Chew 81 mg by mouth daily.     blood glucose meter kit and supplies Dispense based on patient and insurance preference. Use up to four times daily as directed. (FOR ICD-10 E10.9, E11.9).  (  One-Touch Verio meter) 1 each 0   Lancets (ONETOUCH ULTRASOFT) lancets Use to check blood glucose three times daily. Use as instructed. E11.9. 100 each 12   ONETOUCH VERIO test strip TEST FOUR TIMES DAILY AS DIRECTED 100 strip 2   rosuvastatin (CRESTOR) 5 MG tablet Take 5 mg by mouth once a week.     Semaglutide (RYBELSUS) 3 MG TABS Take by mouth.     No current facility-administered medications for this visit.    REVIEW OF SYSTEMS:   Constitutional: Denies fevers, chills or abnormal night sweats Eyes: Denies blurriness of vision, double vision or watery eyes Ears, nose, mouth, throat, and face: Denies mucositis or sore throat Respiratory: Denies cough, dyspnea or wheezes Cardiovascular: Denies palpitation, chest discomfort or lower extremity swelling Gastrointestinal:  Denies nausea, heartburn or change in bowel habits Skin: Denies abnormal skin rashes Lymphatics: Denies new lymphadenopathy or easy bruising Neurological:Denies numbness, tingling or new weaknesses Behavioral/Psych: Mood is stable, no new changes  Breast:  Denies any palpable lumps or discharge All other systems were reviewed with the patient and are negative.  PHYSICAL EXAMINATION: ECOG PERFORMANCE STATUS: 1 - Symptomatic but completely ambulatory  Vitals:   02/23/21 1248  BP: (!) 147/82  Pulse: 94  Resp: 18  Temp: 97.8 F (36.6 C)  SpO2: 100%   Filed Weights   02/23/21 1248  Weight: 234 lb 6.4 oz (106.3 kg)    LABORATORY DATA:  I have reviewed the data as listed Lab Results  Component Value Date   WBC 8.0 02/23/2021   HGB 11.9 (L) 02/23/2021   HCT 37.0 02/23/2021   MCV 79.7 (L) 02/23/2021   PLT 301 02/23/2021   Lab Results  Component Value Date   NA 136 02/23/2021   K 4.0 02/23/2021   CL 106 02/23/2021   CO2 21 (L) 02/23/2021    RADIOGRAPHIC STUDIES: I have personally reviewed the radiological reports and agreed with the findings in the report.  ASSESSMENT AND PLAN:  Malignant neoplasm  of upper-outer quadrant of right breast in female, estrogen receptor positive ([place]) 02/15/2021:Screening mammogram: a possible mass in the right breast  Diagnostic mammogram and [place]: a persistent irregular mass 1.2 cm within the upper-outer right breast. 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   Pathology and radiology counseling: Discussed with the patient, the details of pathology including the type of breast cancer,the clinical staging, the significance of ER, PR and HER-2/neu receptors and the implications for treatment. After reviewing the pathology in detail, we proceeded to discuss the different treatment options between surgery, radiation, chemotherapy, antiestrogen therapies.  Recommendation based on multidisciplinary tumor board: 1. Neoadjuvant chemotherapy with Adriamycin and Cytoxan dose dense 4 followed by Taxol weekly 12  2. Followed by breast conserving surgery with  targeted axillary dissection 3. Followed by adjuvant radiation therapy 4.  Followed by adjuvant antiestrogen therapy  Chemotherapy Counseling: I discussed the  risks and benefits of chemotherapy including the risks of nausea/ vomiting, risk of infection from low WBC count, fatigue due to chemo or anemia, bruising or bleeding due to low platelets, mouth sores, loss/ change in taste and decreased appetite. Liver and kidney function will be monitored through out chemotherapy as abnormalities in liver and kidney function may be a side effect of treatment.  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 Risk of permanent bone marrow dysfunction due to chemo were also discussed.  Plan: 1. Port placement to be done next Monday 2. Echocardiogram 3. Chemotherapy class 4. Breast MRI  Genetic counseling will also be arranged  [place]: Treatment of refractory nausea.  After first cycle of chemo if  patient experience chemo induced nausea and vomiting the randomized from cycle 2 to Aloxi plus Dex plus olanzapine or placebo plus Compazine or placebo plus placebo prior to chemo and take home medications for day 2 today for of Dex plus olanzapine or placebo and Compazine or placebo every 8 hours.  If patient does not have nausea after cycle 1, then the trial is complete.  Return to clinic in 1-2 weeks to start chemotherapy. Patient is in tears because only recently her sister died of metastatic breast cancer.  They are still not fully completed the grieving process while she got diagnosed with breast cancer.   All questions were answered. The patient knows to call the clinic with any problems, questions or concerns.   Rulon Eisenmenger, MD, MPH 02/23/2021    I, Thana Ates, am acting as scribe for [name], MD.  I have reviewed the above documentation for accuracy and completeness, and I agree with the above.

## 2021-02-23 ENCOUNTER — Ambulatory Visit (HOSPITAL_BASED_OUTPATIENT_CLINIC_OR_DEPARTMENT_OTHER): Payer: BC Managed Care – PPO | Admitting: Genetic Counselor

## 2021-02-23 ENCOUNTER — Other Ambulatory Visit: Payer: Self-pay | Admitting: General Surgery

## 2021-02-23 ENCOUNTER — Encounter: Payer: Self-pay | Admitting: General Practice

## 2021-02-23 ENCOUNTER — Encounter: Payer: Self-pay | Admitting: Hematology and Oncology

## 2021-02-23 ENCOUNTER — Inpatient Hospital Stay: Payer: BC Managed Care – PPO

## 2021-02-23 ENCOUNTER — Other Ambulatory Visit: Payer: Self-pay | Admitting: *Deleted

## 2021-02-23 ENCOUNTER — Inpatient Hospital Stay: Payer: BC Managed Care – PPO | Attending: Hematology and Oncology | Admitting: Hematology and Oncology

## 2021-02-23 ENCOUNTER — Other Ambulatory Visit: Payer: Self-pay

## 2021-02-23 ENCOUNTER — Ambulatory Visit
Admission: RE | Admit: 2021-02-23 | Discharge: 2021-02-23 | Disposition: A | Discharge status: Home / Self Care | Payer: BC Managed Care – PPO | Source: Ambulatory Visit | Attending: Radiation Oncology | Admitting: Radiation Oncology

## 2021-02-23 VITALS — BP 147/82 | HR 94 | Temp 97.8°F | Resp 18 | Ht 62.0 in | Wt 234.4 lb

## 2021-02-23 DIAGNOSIS — Z170 Estrogen receptor positive status [ER+]: Secondary | ICD-10-CM

## 2021-02-23 DIAGNOSIS — Z800 Family history of malignant neoplasm of digestive organs: Secondary | ICD-10-CM | POA: Diagnosis not present

## 2021-02-23 DIAGNOSIS — Z452 Encounter for adjustment and management of vascular access device: Secondary | ICD-10-CM | POA: Insufficient documentation

## 2021-02-23 DIAGNOSIS — Z803 Family history of malignant neoplasm of breast: Secondary | ICD-10-CM

## 2021-02-23 DIAGNOSIS — C50411 Malignant neoplasm of upper-outer quadrant of right female breast: Secondary | ICD-10-CM

## 2021-02-23 DIAGNOSIS — Z801 Family history of malignant neoplasm of trachea, bronchus and lung: Secondary | ICD-10-CM | POA: Diagnosis not present

## 2021-02-23 DIAGNOSIS — Z5111 Encounter for antineoplastic chemotherapy: Secondary | ICD-10-CM | POA: Diagnosis present

## 2021-02-23 DIAGNOSIS — Z5189 Encounter for other specified aftercare: Secondary | ICD-10-CM | POA: Diagnosis not present

## 2021-02-23 DIAGNOSIS — C773 Secondary and unspecified malignant neoplasm of axilla and upper limb lymph nodes: Secondary | ICD-10-CM | POA: Insufficient documentation

## 2021-02-23 LAB — CBC WITH DIFFERENTIAL (CANCER CENTER ONLY)
Abs Immature Granulocytes: 0.04 10*3/uL (ref 0.00–0.07)
Basophils Absolute: 0 10*3/uL (ref 0.0–0.1)
Basophils Relative: 0 %
Eosinophils Absolute: 0.1 10*3/uL (ref 0.0–0.5)
Eosinophils Relative: 2 %
HCT: 37 % (ref 36.0–46.0)
Hemoglobin: 11.9 g/dL — ABNORMAL LOW (ref 12.0–15.0)
Immature Granulocytes: 1 %
Lymphocytes Relative: 25 %
Lymphs Abs: 2 10*3/uL (ref 0.7–4.0)
MCH: 25.6 pg — ABNORMAL LOW (ref 26.0–34.0)
MCHC: 32.2 g/dL (ref 30.0–36.0)
MCV: 79.7 fL — ABNORMAL LOW (ref 80.0–100.0)
Monocytes Absolute: 1 10*3/uL (ref 0.1–1.0)
Monocytes Relative: 12 %
Neutro Abs: 4.9 10*3/uL (ref 1.7–7.7)
Neutrophils Relative %: 60 %
Platelet Count: 301 10*3/uL (ref 150–400)
RBC: 4.64 MIL/uL (ref 3.87–5.11)
RDW: 15.1 % (ref 11.5–15.5)
WBC Count: 8 10*3/uL (ref 4.0–10.5)
nRBC: 0 % (ref 0.0–0.2)

## 2021-02-23 LAB — CMP (CANCER CENTER ONLY)
ALT: 13 U/L (ref 0–44)
AST: 12 U/L — ABNORMAL LOW (ref 15–41)
Albumin: 3.7 g/dL (ref 3.5–5.0)
Alkaline Phosphatase: 82 U/L (ref 38–126)
Anion gap: 9 (ref 5–15)
BUN: 10 mg/dL (ref 6–20)
CO2: 21 mmol/L — ABNORMAL LOW (ref 22–32)
Calcium: 9.4 mg/dL (ref 8.9–10.3)
Chloride: 106 mmol/L (ref 98–111)
Creatinine: 0.86 mg/dL (ref 0.44–1.00)
GFR, Estimated: >60 mL/min (ref >60)
Glucose, Bld: 158 mg/dL — ABNORMAL HIGH (ref 70–99)
Potassium: 4 mmol/L (ref 3.5–5.1)
Sodium: 136 mmol/L (ref 135–145)
Total Bilirubin: 0.3 mg/dL (ref 0.3–1.2)
Total Protein: 8.4 g/dL — ABNORMAL HIGH (ref 6.5–8.1)

## 2021-02-23 LAB — GENETIC SCREENING ORDER

## 2021-02-23 MED ORDER — LIDOCAINE-PRILOCAINE 2.5-2.5 % EX CREA: TOPICAL_CREAM | CUTANEOUS | 3 refills | Status: DC

## 2021-02-23 MED ORDER — ONDANSETRON HCL 8 MG PO TABS: 8.0000 mg | ORAL_TABLET | Freq: Two times a day (BID) | ORAL | 1 refills | Status: DC | PRN

## 2021-02-23 MED ORDER — PROCHLORPERAZINE MALEATE 10 MG PO TABS
10.0000 mg | ORAL_TABLET | Freq: Four times a day (QID) | ORAL | 1 refills | Status: DC | PRN
Start: 2021-02-23 — End: 2021-07-08

## 2021-02-23 MED ORDER — DEXAMETHASONE 4 MG PO TABS: 4.0000 mg | ORAL_TABLET | Freq: Every day | ORAL | 0 refills | Status: DC

## 2021-02-23 NOTE — Assessment & Plan Note (Signed)
02/15/2021:Screening mammogram: a possible mass in the right breast  Diagnostic mammogram and [place]: a persistent irregular mass 1.2 cm within the upper-outer right breast. 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   Pathology and radiology counseling: Discussed with the patient, the details of pathology including the type of breast cancer,the clinical staging, the significance of ER, PR and HER-2/neu receptors and the implications for treatment. After reviewing the pathology in detail, we proceeded to discuss the different treatment options between surgery, radiation, chemotherapy, antiestrogen therapies.  Recommendation based on multidisciplinary tumor board: 1. Neoadjuvant chemotherapy with Adriamycin and Cytoxan dose dense 4 followed by Taxol weekly 12  2. Followed by breast conserving surgery with  targeted axillary dissection 3. Followed by adjuvant radiation therapy 4.  Followed by adjuvant antiestrogen therapy  Chemotherapy Counseling: I discussed the risks and benefits of chemotherapy including the risks of nausea/ vomiting, risk of infection from low WBC count, fatigue due to chemo or anemia, bruising or bleeding due to low platelets, mouth sores, loss/ change in taste and decreased appetite. Liver and kidney function will be monitored through out chemotherapy as abnormalities in liver and kidney function may be a side effect of treatment.  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 Risk of permanent bone marrow dysfunction due to chemo were also discussed.  Plan: 1. Port placement to be done next Monday 2. Echocardiogram 3. Chemotherapy class 4. Breast MRI  Genetic counseling will also be arranged  [place]: Treatment of refractory nausea.  After first cycle of chemo if patient experience chemo induced nausea and vomiting the randomized from cycle 2 to  Aloxi plus Dex plus olanzapine or placebo plus Compazine or placebo plus placebo prior to chemo and take home medications for day 2 today for of Dex plus olanzapine or placebo and Compazine or placebo every 8 hours.  If patient does not have nausea after cycle 1, then the trial is complete.  Return to clinic in 1-2 weeks to start chemotherapy. Patient is in tears because only recently her sister died of metastatic breast cancer.  They are still not fully completed the grieving process while she got diagnosed with breast cancer.

## 2021-02-23 NOTE — Progress Notes (Signed)
START ON PATHWAY REGIMEN - Breast     Cycles 1 through 4: A cycle is every 14 days:     Doxorubicin      Cyclophosphamide      [id number]    Cycles 5 through 16: A cycle is every 7 days:     Paclitaxel   **Always confirm dose/schedule in your pharmacy ordering system**  Patient Characteristics: Preoperative or Nonsurgical Candidate (Clinical Staging), Neoadjuvant Therapy followed by Surgery, Invasive Disease, Chemotherapy, HER2 Negative/Unknown/Equivocal, ER Positive Therapeutic Status: Preoperative or Nonsurgical Candidate (Clinical Staging) AJCC M Category: cM0 AJCC Grade: G3 Breast Surgical Plan: Neoadjuvant Therapy followed by Surgery ER Status: Positive (+) AJCC 8 Stage Grouping: IIB HER2 Status: Negative (-) AJCC T Category: cT1c AJCC N Category: cN1 PR Status: Negative (-) Intent of Therapy: Curative Intent, Discussed with Patient

## 2021-02-23 NOTE — Progress Notes (Signed)
Radiation Oncology         [phone]) [phone] ________________________________  Name: Sandra Bishop        MRN: [id number]  Date of Service: 02/23/2021 DOB: 1970/12/23  JT:[id number], Arlie Solomons, MD  Donnie Mesa, MD     REFERRING PHYSICIAN: Donnie Mesa, MD   DIAGNOSIS: The encounter diagnosis was Malignant neoplasm of upper-outer quadrant of right breast in female, estrogen receptor positive ([place]).   HISTORY OF PRESENT ILLNESS: Sandra Bishop is a 50 y.o. female seen in the multidisciplinary breast clinic for a new diagnosis of right breast cancer. The patient was noted to have a screening detected mass in the right breast. She was found on diagnostic imaging to have a 1.2 cm mass in the 10:00 position and one abnormal node in her right axilla. 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 She is seen today to discuss treatment recommendations for her cancer.    PREVIOUS RADIATION THERAPY: No   PAST MEDICAL HISTORY:  Past Medical History:  Diagnosis Date   Allergy    Anemia    Constipation    Diabetes mellitus without complication ([place])    Obesity        PAST SURGICAL HISTORY: Past Surgical History:  Procedure Laterality Date   EYE SURGERY     TUBAL LIGATION       FAMILY HISTORY:  Family History  Problem Relation Age of Onset   Hypertension Mother    Pancreatic cancer Father 57   Breast cancer Sister    Heart failure Maternal Grandmother      SOCIAL HISTORY:  reports that she quit smoking about 10 years ago. Her smoking use included cigarettes. She has never used smokeless tobacco. She reports current alcohol use. She reports that she does not use drugs. The patient lives in [place] and is divorced. She works as a Consulting civil engineer for [employer]. She enjoys spending time with her family.    ALLERGIES: Patient has no known allergies.   MEDICATIONS:   Current Outpatient Medications  Medication Sig Dispense Refill   aspirin 81 MG chewable tablet Chew 81 mg by mouth daily.     blood glucose meter kit and supplies Dispense based on patient and insurance preference. Use up to four times daily as directed. (FOR ICD-10 E10.9, E11.9).  ( One-Touch Verio meter) 1 each 0   Lancets (ONETOUCH ULTRASOFT) lancets Use to check blood glucose three times daily. Use as instructed. E11.9. 100 each 12   metFORMIN (GLUCOPHAGE) 500 MG tablet Take 1 tablet (500 mg total) by mouth 2 (two) times daily with a meal. 60 tablet 0   naproxen (NAPROSYN) 500 MG tablet Take 1 tablet (500 mg total) by mouth 2 (two) times daily. 30 tablet 0   ONETOUCH VERIO test strip TEST FOUR TIMES DAILY AS DIRECTED 100 strip 2   No current facility-administered medications for this encounter.     REVIEW OF SYSTEMS: On review of systems, the patient reports that she is doing well overall. She has had some relief after talking to providers today in her overall treatment plan. She recently lost her sister to breast cancer and is trying to find a way to discuss her own diagnosis with other family members. No complaints are otherwise verbalized.      PHYSICAL EXAM:  Wt Readings from Last 3 Encounters:  12/22/20 235 lb (106.6 kg)  08/11/20 230 lb 12.8 oz (104.7 kg)  11/24/19 232 lb 6.4 oz (105.4 kg)   Temp Readings from Last 3 Encounters:  12/22/20 98.6 F (37 C) (Oral)  08/11/20 98.2 F (36.8 C) (Temporal)  05/24/20 98.2 F (36.8 C) (Oral)   BP Readings from Last 3 Encounters:  12/22/20 140/85  08/11/20 (!) 146/95  05/24/20 (!) 159/98   Pulse Readings from Last 3 Encounters:  12/22/20 87  08/11/20 100  05/24/20 99    In general this is a well appearing African American female in no acute distress. She's alert and oriented x4 and appropriate throughout the examination. Cardiopulmonary assessment is negative for acute distress and she exhibits normal effort. Bilateral  breast exam is deferred.    ECOG = 0  0 - Asymptomatic (Fully active, able to carry on all predisease activities without restriction)  1 - Symptomatic but completely ambulatory (Restricted in physically strenuous activity but ambulatory and able to carry out work of a light or sedentary nature. For example, light housework, office work)  2 - Symptomatic, <50% in bed during the day (Ambulatory and capable of all self care but unable to carry out any work activities. Up and about more than 50% of waking hours)  3 - Symptomatic, >50% in bed, but not bedbound (Capable of only limited self-care, confined to bed or chair 50% or more of waking hours)  4 - Bedbound (Completely disabled. Cannot carry on any self-care. Totally confined to bed or chair)  5 - Death   Eustace Pen MM, Creech RH, Tormey DC, et al. [phone]). "Toxicity and response criteria of the [facility]". [place] Oncol. 5 (6): 649-55    LABORATORY DATA:  Lab Results  Component Value Date   WBC 7.7 12/22/2020   HGB 10.9 (L) 12/22/2020   HCT 34.7 (L) 12/22/2020   MCV 81.5 12/22/2020   PLT 286 12/22/2020   Lab Results  Component Value Date   NA 135 12/22/2020   K 3.6 12/22/2020   CL 101 12/22/2020   CO2 24 12/22/2020   Lab Results  Component Value Date   ALT 14 12/22/2020   AST 11 (L) 12/22/2020   ALKPHOS 61 12/22/2020   BILITOT 0.3 12/22/2020      RADIOGRAPHY: US BREAST LTD UNI RIGHT INC AXILLA  Result Date: 02/08/2021 CLINICAL DATA:  Patient recalled from screening for right breast mass. EXAM: DIGITAL DIAGNOSTIC UNILATERAL RIGHT MAMMOGRAM WITH TOMOSYNTHESIS AND CAD; ULTRASOUND RIGHT BREAST LIMITED TECHNIQUE: Right digital diagnostic mammography and breast tomosynthesis was performed. The images were evaluated with computer-aided detection.; Targeted ultrasound examination of the right breast was performed COMPARISON:  Previous exam(s). ACR Breast Density Category c: The breast tissue is  heterogeneously dense, which may obscure small masses. FINDINGS: There is a persistent irregular mass within the upper-outer right breast, further evaluated with spot compression views. Targeted ultrasound is performed, showing a 1.1 x 1.2 x 0.8 cm irregular hypoechoic mass right breast 10 o'clock position 12 cm from nipple. There is a cortically thickened right axillary lymph node measuring 9 mm. IMPRESSION: Suspicious right breast mass 10 o'clock position. Cortically thickened right axillary lymph node. RECOMMENDATION: Ultrasound-guided core needle biopsy right breast mass 10 o'clock position. Ultrasound-guided core needle biopsy cortically thickened right axillary lymph node. I have discussed the findings and recommendations with the patient. If applicable, a reminder letter will be sent to the patient regarding the next appointment. BI-RADS CATEGORY  4: Suspicious. Electronically Signed   By: Polly Cobia.D.  On: 02/08/2021 16:43  MM DIAG BREAST TOMO UNI RIGHT  Result Date: 02/08/2021 CLINICAL DATA:  Patient recalled from screening for right breast mass. EXAM: DIGITAL DIAGNOSTIC UNILATERAL RIGHT MAMMOGRAM WITH TOMOSYNTHESIS AND CAD; ULTRASOUND RIGHT BREAST LIMITED TECHNIQUE: Right digital diagnostic mammography and breast tomosynthesis was performed. The images were evaluated with computer-aided detection.; Targeted ultrasound examination of the right breast was performed COMPARISON:  Previous exam(s). ACR Breast Density Category c: The breast tissue is heterogeneously dense, which may obscure small masses. FINDINGS: There is a persistent irregular mass within the upper-outer right breast, further evaluated with spot compression views. Targeted ultrasound is performed, showing a 1.1 x 1.2 x 0.8 cm irregular hypoechoic mass right breast 10 o'clock position 12 cm from nipple. There is a cortically thickened right axillary lymph node measuring 9 mm. IMPRESSION: Suspicious right breast mass 10 o'clock  position. Cortically thickened right axillary lymph node. RECOMMENDATION: Ultrasound-guided core needle biopsy right breast mass 10 o'clock position. Ultrasound-guided core needle biopsy cortically thickened right axillary lymph node. I have discussed the findings and recommendations with the patient. If applicable, a reminder letter will be sent to the patient regarding the next appointment. BI-RADS CATEGORY  4: Suspicious. Electronically Signed   By: Lovey Newcomer M.D.   On: 02/08/2021 16:43  [place] AXILLARY NODE CORE BIOPSY RIGHT  Addendum Date: 02/14/2021   ADDENDUM REPORT: 02/14/2021 11:45 ADDENDUM: Pathology revealed GRADE III INVASIVE DUCTAL CARCINOMA of the Right breast, 10 o'clock, (ribbon clip). This was found to be concordant by Dr. Lillia Mountain. Pathology revealed METASTATIC CARCINOMA INVOLVING A LYMPH NODE of the Right axilla, (tribell clip). This was found to be concordant by Dr. Lillia Mountain. Pathology results were discussed with the patient by telephone. The patient reported doing well after the biopsies with tenderness at the sites. Post biopsy instructions and care were reviewed and questions were answered. The patient was encouraged to call [facility] [place] for any additional concerns. My direct phone number was provided. The patient was referred to [facility] on February 23, 2021. Pathology results reported by Terie Purser, RN on 02/14/2021. Electronically Signed   By: Lillia Mountain M.D.   On: 02/14/2021 11:45   Result Date: 02/14/2021 CLINICAL DATA:  Suspicious right breast mass and axillary adenopathy. EXAM: ULTRASOUND GUIDED RIGHT BREAST AND RIGHT AXILLA COMPARISON:  Previous exam(s). PROCEDURE: I met with the patient and we discussed the procedure of ultrasound-guided biopsy, including benefits and alternatives. We discussed the high likelihood of a successful procedure. We discussed the risks of the  procedure, including infection, bleeding, tissue injury, clip migration, and inadequate sampling. Informed written consent was given. The usual time-out protocol was performed immediately prior to the procedure. Lesion quadrant: UPPER-OUTER QUADRANT Using sterile technique and 1% Lidocaine as local anesthetic, under direct ultrasound visualization, a 14 gauge spring-loaded device was used to perform biopsy of a mass in the 10 o'clock region of the right breast using a lateral to medial approach. At the conclusion of the procedure ribbon shaped tissue marker clip was deployed into the biopsy cavity. Follow up 2 view mammogram was performed and dictated separately. I met with the patient and we discussed the procedure of ultrasound-guided biopsy, including benefits and alternatives. We discussed the high likelihood of a successful procedure. We discussed the risks of the procedure, including infection, bleeding, tissue injury, clip migration, and inadequate sampling. Informed written consent was given. The usual time-out protocol was performed immediately  prior to the procedure. Lesion quadrant: RIGHT AXILLA Using sterile technique and 1% Lidocaine and 2% lidocaine with epinephrine as local anesthetic, under direct ultrasound visualization, a 14 gauge spring-loaded device was used to perform biopsy of a right axillary lymph node using a lateral to medial approach. At the conclusion of the procedure tribell tissue marker clip was deployed into the biopsy cavity. Follow up 2 view mammogram was performed and dictated separately. IMPRESSION: Ultrasound guided biopsies of the right breast and right axilla. No apparent complications. Electronically Signed: By: Lillia Mountain M.D. On: 02/11/2021 13:33  MM CLIP PLACEMENT RIGHT  Result Date: 02/11/2021 CLINICAL DATA:  Status post ultrasound-guided core biopsies of a mass in the 10 o'clock region of the right breast and a right axillary lymph node. EXAM: 3D DIAGNOSTIC RIGHT  MAMMOGRAM POST ULTRASOUND BIOPSIES COMPARISON:  Previous exam(s). FINDINGS: 3D Mammographic images were obtained following ultrasound guided biopsies of a mass in the 10 o'clock region of the right breast and a right axillary lymph node. There is a ribbon shaped clip in appropriate position in the upper-outer quadrant of the right breast and a tribell clip in the right axilla. IMPRESSION: Appropriate positioning of the ribbon shaped clip in the upper-outer quadrant of the right breast and tribell clip in the right axilla. Final Assessment: Post Procedure Mammograms for Marker Placement Electronically Signed   By: Lillia Mountain M.D.   On: 02/11/2021 13:43  [place] RT BREAST BX W LOC DEV 1ST LESION IMG BX SPEC US GUIDE  Addendum Date: 02/14/2021   ADDENDUM REPORT: 02/14/2021 11:45 ADDENDUM: Pathology revealed GRADE III INVASIVE DUCTAL CARCINOMA of the Right breast, 10 o'clock, (ribbon clip). This was found to be concordant by Dr. Lillia Mountain. Pathology revealed METASTATIC CARCINOMA INVOLVING A LYMPH NODE of the Right axilla, (tribell clip). This was found to be concordant by Dr. Lillia Mountain. Pathology results were discussed with the patient by telephone. The patient reported doing well after the biopsies with tenderness at the sites. Post biopsy instructions and care were reviewed and questions were answered. The patient was encouraged to call [facility] [place] for any additional concerns. My direct phone number was provided. The patient was referred to [facility] on February 23, 2021. Pathology results reported by Terie Purser, RN on 02/14/2021. Electronically Signed   By: Lillia Mountain M.D.   On: 02/14/2021 11:45   Result Date: 02/14/2021 CLINICAL DATA:  Suspicious right breast mass and axillary adenopathy. EXAM: ULTRASOUND GUIDED RIGHT BREAST AND RIGHT AXILLA COMPARISON:  Previous exam(s). PROCEDURE: I met with the patient and  we discussed the procedure of ultrasound-guided biopsy, including benefits and alternatives. We discussed the high likelihood of a successful procedure. We discussed the risks of the procedure, including infection, bleeding, tissue injury, clip migration, and inadequate sampling. Informed written consent was given. The usual time-out protocol was performed immediately prior to the procedure. Lesion quadrant: UPPER-OUTER QUADRANT Using sterile technique and 1% Lidocaine as local anesthetic, under direct ultrasound visualization, a 14 gauge spring-loaded device was used to perform biopsy of a mass in the 10 o'clock region of the right breast using a lateral to medial approach. At the conclusion of the procedure ribbon shaped tissue marker clip was deployed into the biopsy cavity. Follow up 2 view mammogram was performed and dictated separately. I met with the patient and we discussed the procedure of ultrasound-guided biopsy, including benefits and alternatives. We discussed the high likelihood  of a successful procedure. We discussed the risks of the procedure, including infection, bleeding, tissue injury, clip migration, and inadequate sampling. Informed written consent was given. The usual time-out protocol was performed immediately prior to the procedure. Lesion quadrant: RIGHT AXILLA Using sterile technique and 1% Lidocaine and 2% lidocaine with epinephrine as local anesthetic, under direct ultrasound visualization, a 14 gauge spring-loaded device was used to perform biopsy of a right axillary lymph node using a lateral to medial approach. At the conclusion of the procedure tribell tissue marker clip was deployed into the biopsy cavity. Follow up 2 view mammogram was performed and dictated separately. IMPRESSION: Ultrasound guided biopsies of the right breast and right axilla. No apparent complications. Electronically Signed: By: Lillia Mountain M.D. On: 02/11/2021 13:33      IMPRESSION/PLAN: 1. Stage IIB,  cT1cN1M0, grade 3 ER positive invasive ductal carcinoma of the right breast. Dr. Lisbeth Renshaw discusses the pathology findings and reviews the nature of right breast disease. The consensus from the breast conference includes neoadjuvant chemotherapy followed by breast conservation with lumpectomy with targeted node dissection. Dr. Lisbeth Renshaw recommends external radiotherapy to the breast  and regional nodes to reduce risks of local recurrence followed by antiestrogen therapy. We discussed the risks, benefits, short, and long term effects of radiotherapy, as well as the curative intent, and the patient is interested in proceeding. Dr. Lisbeth Renshaw discusses the delivery and logistics of radiotherapy and anticipates a course of 6 1/2 weeks of radiotherapy to the right breast and regional nodes. We will see her back a few weeks after surgery to discuss the simulation process and anticipate we starting radiotherapy about 4-6 weeks after surgery.  2. Contraceptive Counseling. She has had tubal ligation and does not need pregnancy testing prior to proceeding with radiotherapy treatment or planning.  In a visit lasting 60 minutes, greater than 50% of the time was spent face to face reviewing her case, as well as in preparation of, discussing, and coordinating the patient's care.  The above documentation reflects my direct findings during this shared patient visit. Please see the separate note by Dr. Lisbeth Renshaw on this date for the remainder of the patient's plan of care.    Carola Rhine, [facility]    **Disclaimer: This note was dictated with voice recognition software. Similar sounding words can inadvertently be transcribed and this note may contain transcription errors which may not have been corrected upon publication of note.**

## 2021-02-23 NOTE — Addendum Note (Signed)
Encounter addended by: Hayden Pedro, PA-C on: 02/23/2021 2:49 PM  Actions taken: Level of Service modified

## 2021-02-23 NOTE — Progress Notes (Signed)
[place] Psychosocial Distress Screening Spiritual Care  Met with Sandra Bishop and her sister Sandra Bishop in [facility] to introduce [facility] team/resources, reviewing distress screen per protocol.  The patient scored a 3 on the Psychosocial Distress Thermometer which indicates mild distress. Also assessed for distress and other psychosocial needs.   ONCBCN DISTRESS SCREENING 02/23/2021  Screening Type Initial Screening  Distress experienced in past week (1-10) 3  Emotional problem type Adjusting to illness  Referral to support programs Yes   [name] and her sister are using humor and faith to cope. They shared that their youngest sister just died of breast cancer on 2023-02-02. Provided empathic listening, normalization of feelings, and emotional support.   Follow up needed: Yes.  Plan to follow up by phone in ca 2 weeks for pastoral check-in.   [employer], [place], [facility] Pager [phone] Voicemail [phone]

## 2021-02-24 ENCOUNTER — Encounter: Payer: Self-pay | Admitting: Genetic Counselor

## 2021-02-24 ENCOUNTER — Encounter: Payer: Self-pay | Admitting: Hematology and Oncology

## 2021-02-24 ENCOUNTER — Encounter: Payer: Self-pay | Admitting: *Deleted

## 2021-02-24 ENCOUNTER — Telehealth: Payer: Self-pay | Admitting: Emergency Medicine

## 2021-02-24 DIAGNOSIS — Z803 Family history of malignant neoplasm of breast: Secondary | ICD-10-CM | POA: Insufficient documentation

## 2021-02-24 DIAGNOSIS — Z800 Family history of malignant neoplasm of digestive organs: Secondary | ICD-10-CM | POA: Insufficient documentation

## 2021-02-24 DIAGNOSIS — Z801 Family history of malignant neoplasm of trachea, bronchus and lung: Secondary | ICD-10-CM | POA: Insufficient documentation

## 2021-02-24 NOTE — Telephone Encounter (Signed)
PT:[id number] - TREATMENT OF REFRACTORY NAUSEA  02/24/21  The patient was referred to this study by Dr. Lindi Adie  yesterday when the patient was in the office for breast clinic.  The patient requested to be called at home.  Called patient to introduce study and answer any questions.  The study procedures were outlined with the patient over the phone.  The voluntary nature of research studies and informed consent process were discussed.  Patient states she is considering the study.  She was sent the informed consent form and HIPAA authorization via email at her request.  Will follow up with the patient on Monday 02/28/2021 in person while she is in the clinic for chemo education.  She was given this coordinator's contact information and encouraged to call with any questions.  Clabe Seal Clinical Research Coordinator I  02/24/21  3:58 PM

## 2021-02-24 NOTE — Progress Notes (Signed)
REFERRING PROVIDER: [name], MD [place],  [place] [id number]  PRIMARY PROVIDER:  Forrest Moron, MD  PRIMARY REASON FOR VISIT:  1. Malignant neoplasm of upper-outer quadrant of right breast in female, estrogen receptor positive ([place])   2. Family history of breast cancer   3. Family history of pancreatic cancer   4. Family history of colon cancer   5. Family history of lung cancer       HISTORY OF PRESENT ILLNESS:   Sandra Bishop, a 50 y.o. female, was seen for a [facility] cancer genetics consultation at the request of Dr. Lindi Adie due to a personal and family history of cancer.  Sandra Bishop presents to clinic today to discuss the possibility of a hereditary predisposition to cancer, genetic testing, and to further clarify her future cancer risks, as well as potential cancer risks for family members.   In July of 2022, at the age of 43, Sandra Bishop was diagnosed with invasive ductal carcinoma of the right breast. The tumor is ER+/PR-/Her2-. The treatment plan includes neoadjuvant chemotherapy, surgery, radiation therapy, and antiestrogen therapy.    CANCER HISTORY:  Oncology History  Malignant neoplasm of upper-outer quadrant of right breast in female, estrogen receptor positive ([place])  02/15/2021 Initial Diagnosis   Screening mammogram: a possible mass in the right breast  Diagnostic mammogram and [place]: a persistent irregular mass 1.2 cm within the upper-outer right breast. 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    02/23/2021 Cancer Staging   Staging form: Breast, AJCC 8th Edition - Clinical stage from 02/23/2021: Stage IIB (cT1c, cN1(f), cM0, G3, ER+, PR-, HER2-) - Signed by [name], MD on 02/23/2021 Stage prefix: Initial diagnosis Method of lymph node assessment: Core biopsy Histologic grading system: 3 grade system   03/03/2021 -  Chemotherapy    Patient is on Treatment Plan:  BREAST ADJUVANT DOSE DENSE AC Q14D / PACLITAXEL Q7D          RISK FACTORS:  Menarche was at age 41.  First live birth at age 74.  OCP use for approximately 0 years.  Ovaries intact: yes.  Hysterectomy: no.  Menopausal status: premenopausal.  HRT use: 0 years. Colonoscopy: no; not examined. Mammogram within the last year: yes.   Past Medical History:  Diagnosis Date   Allergy    Anemia    Breast cancer ([place])    Constipation    Diabetes mellitus without complication ([place])    Family history of breast cancer    Family history of colon cancer    Family history of lung cancer    Family history of pancreatic cancer    Obesity     Past Surgical History:  Procedure Laterality Date   EYE SURGERY     TUBAL LIGATION      Social History   Socioeconomic History   Marital status: Divorced    Spouse name: Not on file   Number of children: 2   Years of education: Not on file   Highest education level: Not on file  Occupational History   Not on file  Tobacco Use   Smoking status: Former    Types: Cigarettes    Quit date: 12/28/2010    Years since quitting: 10.1   Smokeless tobacco: Never   Tobacco comments:    quit in 2012, previously smoked 15 years  Vaping Use   Vaping Use: Not on file  Substance and Sexual  Activity   Alcohol use: Not Currently    Comment: occ   Drug use: No   Sexual activity: Yes    Birth control/protection: None  Other Topics Concern   Not on file  Social History Narrative   Not on file   Social Determinants of Health   Financial Resource Strain: Not on file  Food Insecurity: Not on file  Transportation Needs: Not on file  Physical Activity: Not on file  Stress: Not on file  Social Connections: Not on file     FAMILY HISTORY:  We obtained a detailed, 4-generation family history.  Significant diagnoses are listed below: Family History  Problem Relation Age of Onset   Hypertension Mother    Pancreatic cancer Father 45   Breast  cancer Half-Sister 3       negative genetic testing   Colon cancer Maternal Uncle        dx >50   Lung cancer Maternal Uncle        dx 81s, hx smoking   Cancer Paternal Aunt        unknown type, dx early 5s   Heart failure Maternal Grandmother    Breast cancer Other 28       mother's first cousin   Sandra Bishop has two sons (ages 43 and 55). She had two maternal half-sisters, two paternal half-sisters, and one paternal half-brother. One sister died recently from breast cancer that was diagnosed at age 18. This sister had negative genetic testing.  Sandra Bishop mother is alive at age 73 without cancer. There were four maternal aunts and five maternal uncles. One uncle had colon cancer older than 25. Another uncle had lung cancer diagnosed in his 69s. There is no known cancer among maternal first cousins. Sandra Bishop maternal grandmother died at age 93 without cancer. She does not have information about her maternal grandfather. Sandra Bishop mother has a first cousin was diagnosed with breast cancer at age 62.  Sandra Bishop father died in his early 64s with pancreatic cancer cancer. There were two paternal aunts and one paternal uncle. One aunt died from an unknown cancer in her early 45s. There is no known cancer among paternal cousins. Sandra Bishop paternal grandmother died older than 67 without cancer. She does not have information about her paternal grandfather.   Sandra Bishop is aware of previous family history of genetic testing for hereditary cancer risks. Patient's ancestors are of unknown descent. There is no reported Ashkenazi Jewish ancestry. There is no known consanguinity.  GENETIC COUNSELING ASSESSMENT: Sandra Bishop is a 50 y.o. female with a personal and family history of cancer which is somewhat suggestive of a hereditary cancer syndrome and predisposition to cancer. We, therefore, discussed and recommended the following at today's visit.   DISCUSSION:  We discussed that, in general, most  cancer is not inherited in families, but instead is sporadic or familial. Sporadic cancers occur by chance and typically happen at older ages (>50 years) as this type of cancer is caused by genetic changes acquired during an individual's lifetime. Some families have more cancers than would be expected by chance; however, the ages or types of cancer are not consistent with a known genetic mutation or known genetic mutations have been ruled out. This type of familial cancer is thought to be due to a combination of multiple genetic, environmental, hormonal, and lifestyle factors. While this combination of factors likely increases the risk of cancer, the exact source of this risk is not currently  identifiable or testable.    We discussed that approximately 5-10% of breast cancer is hereditary, meaning that it is due to a mutation in a single gene that is passed down from generation to generation in a family. Most hereditary cases of breast cancer are associated with the BRCA1 and BRCA2 genes. There are other genes that can be associated an increased risk for breast cancer, including ATM, CHEK2, PALB2, etc. We discussed that testing can be beneficial for several reasons, including knowing about other cancer risks, identifying potential screening and risk-reduction options that may be appropriate, and to understand if other family members could be at risk for cancer and allow them to undergo genetic testing.   We reviewed the characteristics, features and inheritance patterns of hereditary cancer syndromes. We also discussed genetic testing, including the appropriate family members to test, the process of testing, insurance coverage and turn-around-time for results. We discussed the implications of a negative, positive and/or variant of uncertain significant result. We recommended Sandra Bishop pursue genetic testing for the [facility] CancerNext-Expanded + RNAinsight panel.   The CancerNext-Expanded + RNAinsight gene panel  offered by [employer] and includes sequencing and rearrangement analysis for the following 77 genes: AIP, ALK, APC, ATM, AXIN2, BAP1, BARD1, BLM, BMPR1A, BRCA1, BRCA2, BRIP1, CDC73, CDH1, CDK4, CDKN1B, CDKN2A, CHEK2, CTNNA1, DICER1, FANCC, FH, FLCN, GALNT12, KIF1B, LZTR1, MAX, MEN1, MET, MLH1, MSH2, MSH3, MSH6, MUTYH, NBN, NF1, NF2, NTHL1, PALB2, PHOX2B, PMS2, POT1, PRKAR1A, PTCH1, PTEN, RAD51C, RAD51D, RB1, RECQL, RET, SDHA, SDHAF2, SDHB, SDHC, SDHD, SMAD4, SMARCA4, SMARCB1, SMARCE1, STK11, SUFU, TMEM127, TP53, TSC1, TSC2, VHL and XRCC2 (sequencing and deletion/duplication); EGFR, EGLN1, HOXB13, KIT, MITF, PDGFRA, POLD1 and POLE (sequencing only); EPCAM and GREM1 (deletion/duplication only). RNA data is routinely analyzed for use in variant interpretation for all genes.  Based on Sandra Bishop's personal and family history of cancer, she meets medical criteria for genetic testing. Despite that she meets criteria, there may still be an out of pocket cost.   PLAN: After considering the risks, benefits, and limitations, Sandra Bishop provided informed consent to pursue genetic testing and the blood sample was sent to [facility] for analysis of the CancerNext-Expanded + RNAinsight panel. Results should be available within approximately two-three weeks' time, at which point they will be disclosed by telephone to Sandra Bishop, as will any additional recommendations warranted by these results. Sandra Bishop will receive a summary of her genetic counseling visit and a copy of her results once available. This information will also be available in Epic.   Sandra Bishop questions were answered to her satisfaction today. Our contact information was provided should additional questions or concerns arise. Thank you for the referral and allowing [place] to share in the care of your patient.   Clint Guy, [place], [facility], [place] Licensed, Certified Dispensing [email or web address] Phone: [phone]  The patient  was seen for a total of 20 minutes in face-to-face genetic counseling. Patient was seen with her sister, Sandra Bishop. This patient was discussed with Drs. Magrinat, Lindi Adie and/or Burr Medico who agrees with the above.    _______________________________________________________________________ For Office Staff:  Number of people involved in session: 1 Was an Intern/ student involved with case: no

## 2021-02-24 NOTE — Progress Notes (Addendum)
COVID Vaccine Completed: Yes Date COVID Vaccine completed: x2 Has received booster:N/A COVID vaccine manufacturer: Pfizer      Date of COVID positive in last 90 days: No  PCP - Dr. Lindalou Hose Family Cardiologist - N/A Oncologist-Gudena, Loleta Dicker, MD   Chest x-ray - greater than 1 year in epic EKG - 12/24/2020 in epic Stress Test - greater than 2 years in epic ECHO - 06/26/2019 in epic Cardiac Cath - N/A Pacemaker/ICD device last checked: N/A  Sleep Study - N/A CPAP - N/A  Fasting Blood Sugar - 107-120 Checks Blood Sugar ___1-2__ times a day  Blood Thinner Instructions: N/A Aspirin Instructions: N/A Last Dose: N/A  Activity level:  Unable to go up a flight of stairs without shortness of breath, denies chest pain    Anesthesia review: N/A  Patient denies shortness of breath, fever, cough and chest pain at PAT appointment   Patient verbalized understanding of instructions that were given to them at the PAT appointment. Patient was also instructed that they will need to review over the PAT instructions again at home before surgery.

## 2021-02-25 ENCOUNTER — Encounter (HOSPITAL_COMMUNITY): Payer: Self-pay | Admitting: General Surgery

## 2021-02-25 ENCOUNTER — Other Ambulatory Visit: Payer: Self-pay

## 2021-02-25 NOTE — Progress Notes (Signed)
Spoke with Sharyn Lull at Dr. Marlowe Aschoff office to inform them Ms. Degidio has an ECHO appt at 8:50AM the same day as the surgery. Nahia is going to contact cancer center to see if the can reschedule the time of the ECHO.

## 2021-02-28 ENCOUNTER — Ambulatory Visit
Admission: RE | Admit: 2021-02-28 | Discharge: 2021-02-28 | Disposition: A | Discharge status: Home / Self Care | Payer: BC Managed Care – PPO | Source: Ambulatory Visit | Attending: Hematology and Oncology | Admitting: Hematology and Oncology

## 2021-02-28 ENCOUNTER — Encounter: Payer: Self-pay | Admitting: Emergency Medicine

## 2021-02-28 ENCOUNTER — Inpatient Hospital Stay: Payer: BC Managed Care – PPO

## 2021-02-28 ENCOUNTER — Other Ambulatory Visit: Payer: Self-pay

## 2021-02-28 DIAGNOSIS — C50411 Malignant neoplasm of upper-outer quadrant of right female breast: Secondary | ICD-10-CM

## 2021-02-28 DIAGNOSIS — Z170 Estrogen receptor positive status [ER+]: Secondary | ICD-10-CM

## 2021-02-28 IMAGING — MR MR BREAST BILAT WO/W CM
6 of 9 series · 25 of 48 positions shown · IV contrast (gadavist)
Comparison: No prior MRI available for comparison. Correlation made
with prior mammogram and ultrasound images.

CLINICAL DATA: 49-year-old female presenting for MRI secondary to a
recently diagnosed grade 3 invasive ductal carcinoma of the right
breast at 10 o'clock (ribbon clip), and a metastatic right axillary
lymph node (tribell clip). Her biopsies were performed [DATE].
She has family history of breast cancer in her sister at age 45.

LABS:  n/a
EXAM:
BILATERAL BREAST MRI WITH AND WITHOUT CONTRAST
TECHNIQUE: Multiplanar, multisequence MR images of both breasts were obtained
prior to and following the intravenous administration of 10 ml of
Gadavist

[Series 2: t2_tirm_tra ipat (a-p) · axial · 3.0mm · 0.74mm/px · 1 of 66 slices shown]
[im 1/66]
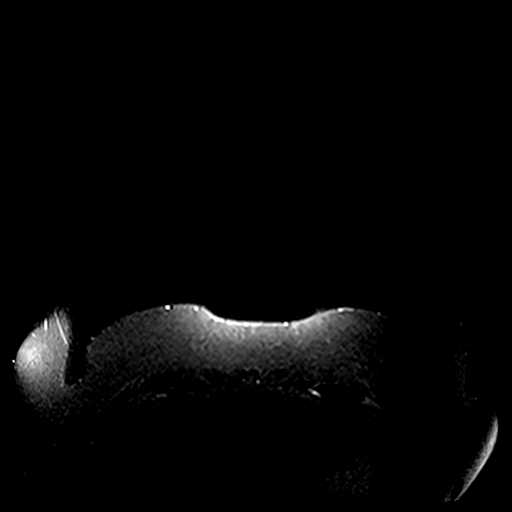

[Series 3: fl3d pre-cm no · axial · non-contrast · 0.9mm · 0.99mm/px · z∈[-96,+119]mm · 5 of 209 slices shown]
[im 1/209]
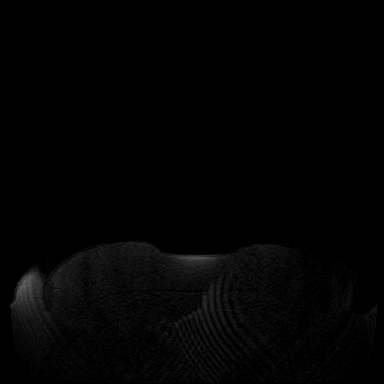
[im 53/209]
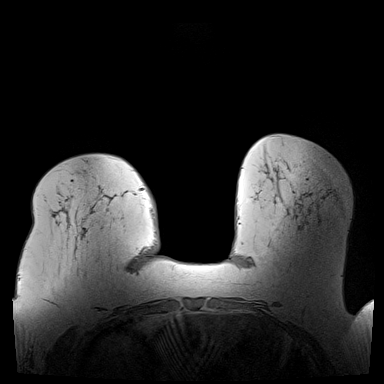
[im 105/209]
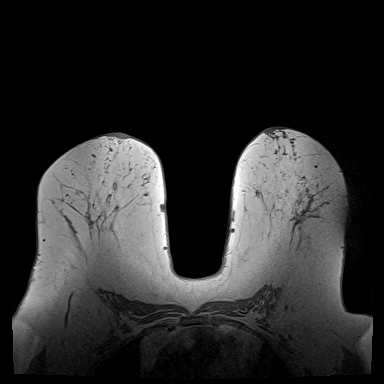
[im 157/209]
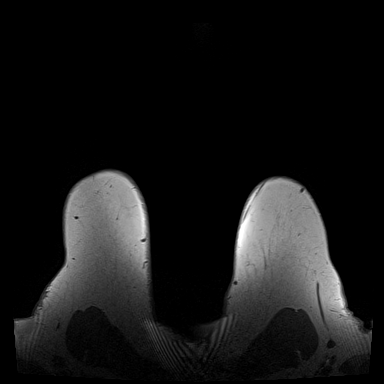
[im 209/209]
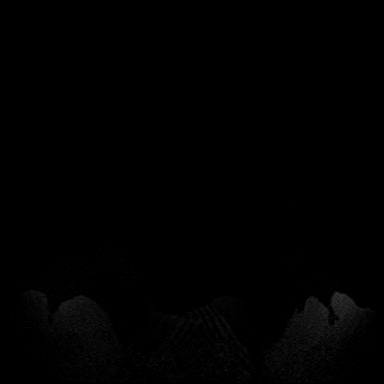

[Series 5: fl3d pre-cm · axial · non-contrast · 0.9mm · 0.91mm/px · z∈[-96,+119]mm · 6 of 240 slices shown]
[im 1/240]
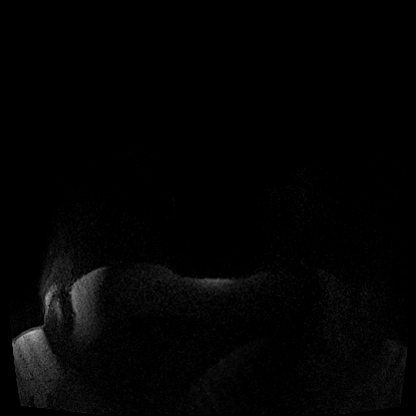
[im 48/240]
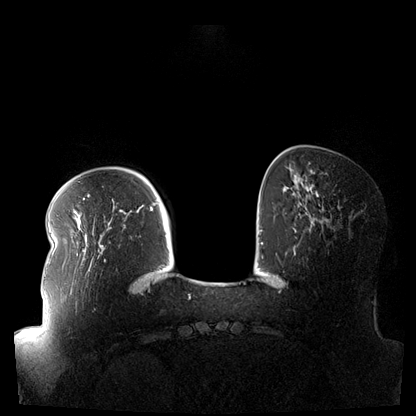
[im 96/240]
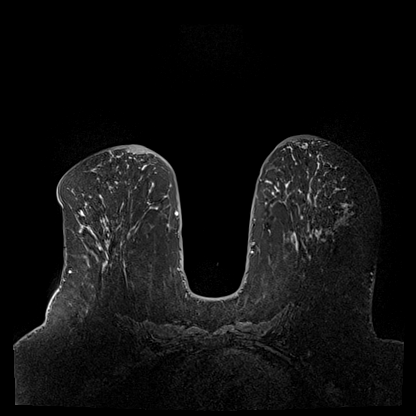
[im 144/240]
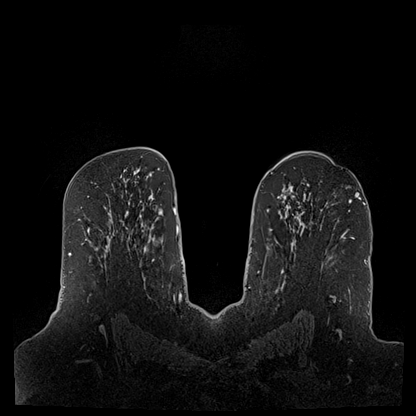
[im 192/240]
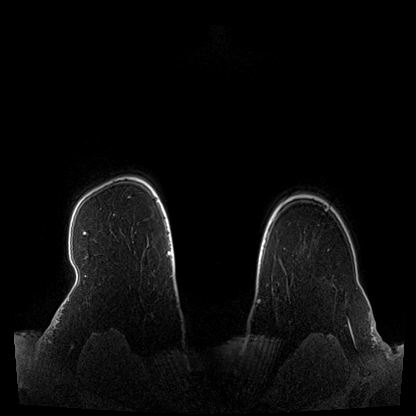
[im 240/240]
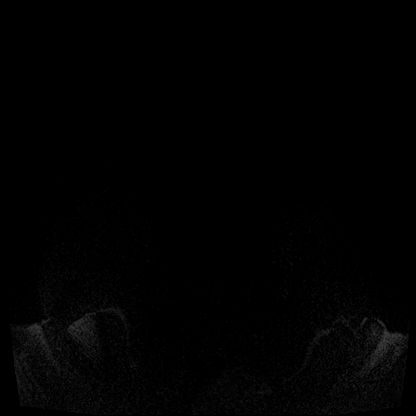

[Series 6: fl3d post-cm 20 · axial · 0.9mm · 0.91mm/px · z∈[-96,+119]mm · 6 of 240 slices shown (1 of 2)]
[im 1/240]
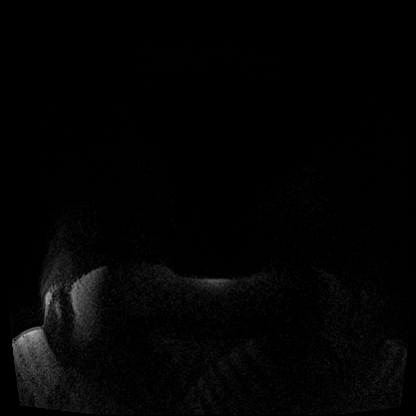
[im 48/240]
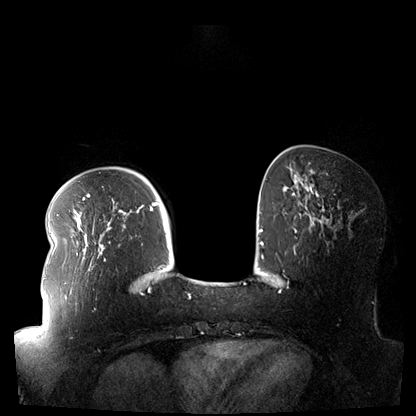
[im 96/240]
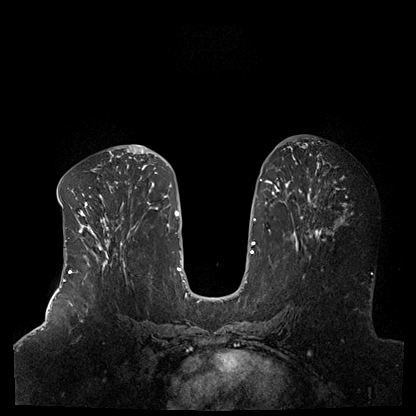
[im 144/240]
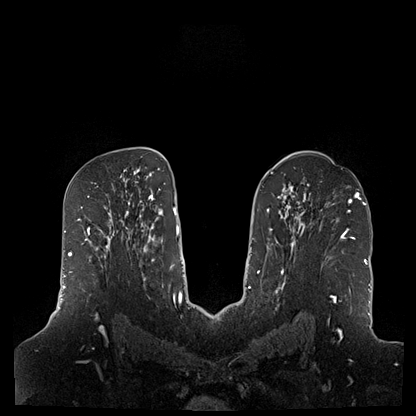
[im 192/240]
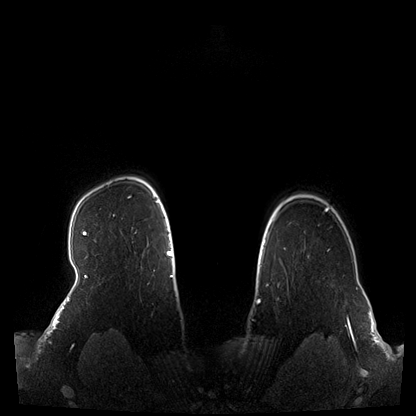
[im 240/240]
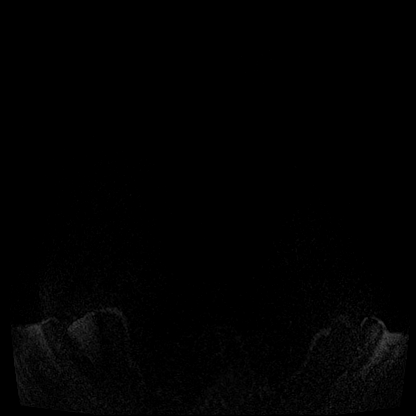

[Series 7: fl3d post-cm 20 · axial · 0.9mm · 0.91mm/px · z∈[-96,+119]mm · 6 of 240 slices shown (2 of 2)]
[im 1/240]
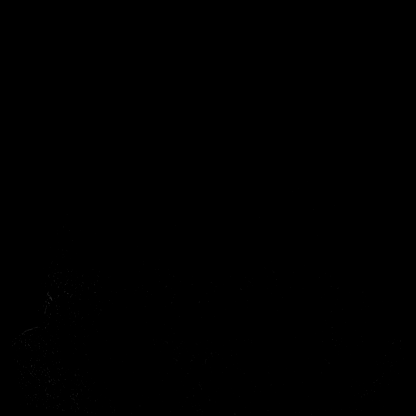
[im 48/240]
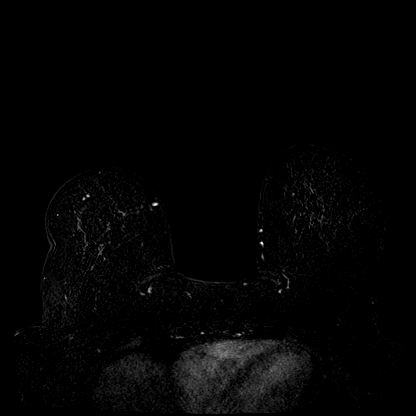
[im 96/240]
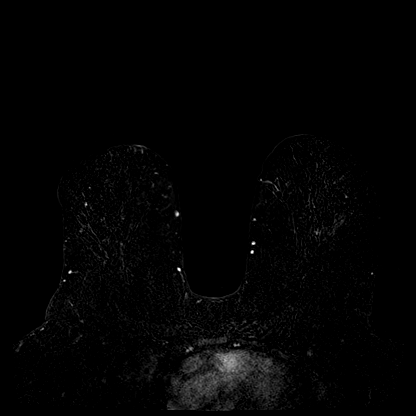
[im 144/240]
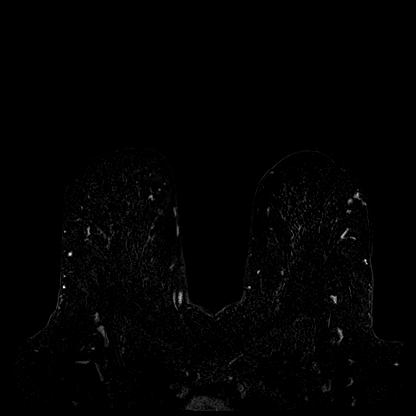
[im 192/240]
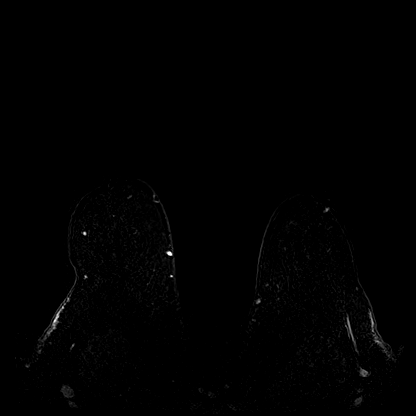
[im 240/240]
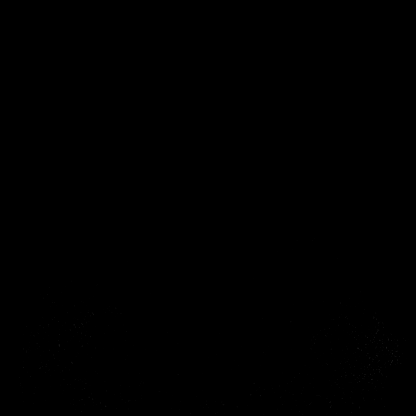

[Series 9: fl3d post-cm 3min · axial · 0.9mm · 0.91mm/px · 1 of 240 slices shown]
[im 1/240]
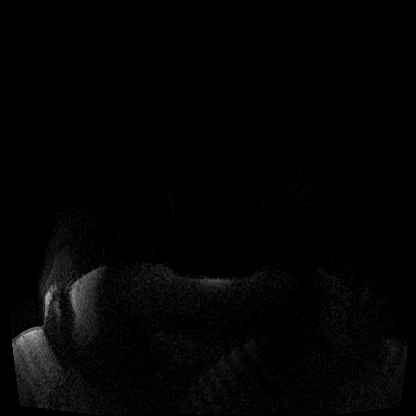

[25 of 48 positions shown; findings below may reference images not displayed]

Three-dimensional MR images were rendered by post-processing of the
original MR data on an independent workstation. The
three-dimensional MR images were interpreted, and findings are
reported in the following complete MRI report for this study. Three
dimensional images were evaluated at the independent interpreting
workstation using the DynaCAD thin client.
FINDINGS: Breast composition: c. Heterogeneous fibroglandular tissue.

Background parenchymal enhancement: Moderate.

Right breast:

In the upper outer middle depth of the right breast, there is an
irregular enhancing mass with associated susceptibility artifact
biopsy marking clip indicating the biopsy-proven site of malignancy.
The mass measures 1.8 cm (1.1 cm by ultrasound).

Additionally, in the lower outer quadrant of the right breast,
middle depth there is a 4.3 cm area of non mass enhancement. This
potentially could represent normal background parenchymal
enhancement.

There are multiple additional oval masses in the right breast as
follows:

1. In the superior central breast, middle depth there is a 6 mm
enhancing mass (series 7, image 67). This cannot be confidently
identified on the patient's mammogram, and is indeterminate.

2. Inferior to this, but also in the superior central breast, middle
depth there is a 8 mm enhancing mass which is T2 bright (series 7,
image 113). This cannot confidently be said to be stable
mammographically for over 2 years, and is therefore indeterminate.
The appearance however is suggestive of a fibroadenoma.

3. Medial and slightly anterior to this mass is a 8 mm oval mass
(series 7, image 118). This corresponds well with a stable mass in
the medial right breast seen on remote prior screening mammograms.

4. In the lateral middle depth of the right breast, there is a
cm enhancing mass which is T2 bright (series 7, image 137). This
corresponds well with a mass in the lateral right breast seen on
multiple prior screening mammograms and has not changed in size.
This most likely represents a lymph node or a fibroadenoma.

Left breast:

In the upper inner middle depth of the left breast, there is a
cm oval mass which is T2 bright (series 7, image 87). This cannot
confidently be said to be stable mammographically for greater than 2
years, and is therefore indeterminate. The appearance is suggestive
of a fibroadenoma however. No other mass or abnormal enhancement.

Lymph nodes: There is 1 enlarged right axillary lymph node
associated with susceptibility artifact from a biopsy marking clip.
No other abnormal lymph nodes are identified in the right or the
left axilla.

Ancillary findings:  None.
IMPRESSION: RIGHT BREAST:

1. The biopsy proven grade 3 invasive ductal carcinoma of the
upper-outer right breast measures 1.8 cm by MRI (1.1 cm by
ultrasound).

2. There is a 4.3 cm area of non mass enhancement in the lower outer
middle depth of the right breast. This could represent benign
background parenchymal enhancement, but further evaluation with
biopsy is warranted.

3. There are multiple small oval masses in the right breast, a few
of which in correlation with the patient's mammogram images have
been stable for several years. However two of these masses (labeled
1 and 2 in the body of the report), are indeterminate, but may
represent fibroadenomas.

4. One biopsy proven metastatic lymph node is seen in the right
axilla. No additional abnormal lymph nodes are identified.

LEFT BREAST:

1. There is 1 indeterminate mass in the upper inner left breast,
possibly a fibroadenoma.

2.  No evidence of left axillary lymphadenopathy.

RECOMMENDATION:
1. MRI guided biopsy is recommended for the non mass enhancement in
the lower outer quadrant of the right breast. If this persists the
day of biopsy, and still appears to span 4.3 cm, then biopsy of the
anterior and posterior margin would be recommended. If the
enhancement does not persist, than it would be consistent with
background parenchymal enhancement.

2. MRI guided biopsy is recommended for the 2 indeterminate right
breast masses (labeled 1 and 2 in the body of the report).

3. MRI guided biopsy is recommended for the indeterminate mass in
the upper inner left breast.

These biopsies may need to be scheduled on 2 separate days. However,
if the non mass enhancement in the lower outer right breast does not
persist, than the 3 remaining biopsies may be performed on the same
occasion.

BI-RADS CATEGORY  4: Suspicious.

## 2021-02-28 MED ORDER — GADOBUTROL 1 MMOL/ML IV SOLN
10.0000 mL | Freq: Once | INTRAVENOUS | Status: AC | PRN
  Administered 2021-02-28: 10 mL via INTRAVENOUS

## 2021-02-28 NOTE — Research (Signed)
[id number] - TREATMENT OF REFRACTORY NAUSEA  Patient Sandra Bishop was identified by Dr. Lindi Adie as a potential candidate for the above listed study.  This Clinical Research Coordinator met with Sandra Bishop, MRN [id number], on 02/28/21 in a manner and location that ensures patient privacy to discuss participation in the above listed research study.  A copy of the informed consent document and separate HIPAA Authorization was provided to the patient.  Patient reads, speaks, and understands [place].   Patient was provided with the business card of this Coordinator and encouraged to contact the research team with any questions.   The patient decided to not participate in this study. She was thanked for her time and consideration of this study.  Clabe Seal Clinical Research Coordinator I  02/28/21 1:30 PM

## 2021-03-01 ENCOUNTER — Other Ambulatory Visit: Payer: Self-pay | Admitting: *Deleted

## 2021-03-01 ENCOUNTER — Telehealth: Payer: Self-pay | Admitting: *Deleted

## 2021-03-01 ENCOUNTER — Encounter: Payer: Self-pay | Admitting: *Deleted

## 2021-03-01 ENCOUNTER — Telehealth: Payer: Self-pay

## 2021-03-01 ENCOUNTER — Telehealth: Payer: Self-pay | Admitting: Hematology and Oncology

## 2021-03-01 DIAGNOSIS — C50411 Malignant neoplasm of upper-outer quadrant of right female breast: Secondary | ICD-10-CM

## 2021-03-01 DIAGNOSIS — Z170 Estrogen receptor positive status [ER+]: Secondary | ICD-10-CM

## 2021-03-01 NOTE — Telephone Encounter (Signed)
Notified Luz Brazen of completion of FMLA paperwork. Fax transmission confirmation received and copy mailed to [place] as requested.

## 2021-03-01 NOTE — H&P (Signed)
REFERRING PHYSICIAN: Dr. Miquel Dunn  PROVIDER: Georgianne Fick, MD  Medical Oncology : Dr. Lindi Adie Radiation Oncology: Dr. Lisbeth Renshaw  Care Team: Patient Care Team: Sharyne Richters as PCP - General (Family Medicine)   MRN: [id number] DOB: 1970-08-04 DATE OF ENCOUNTER: 02/23/2021  Subjective   Chief Complaint: Breast Cancer   History of Present Illness: Sandra Bishop is a 50 y.o. female who is seen today as an office consultation at the request of Dr. [name] for evaluation of Breast Cancer  Pt is a 50 yo F who presents with a screening detected right breast cancer. She was found to have a mass on screening mammography. She underwent dx imaging that showed a 1.2 cm mass at 10 o'clock as well as a suspicious lymph node in the axilla. 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   Of note, she has a sister with stage IV breast cancer and a maternal uncle with colon cancer. Her father had possible pancreatic cancer.  She works as a Consulting civil engineer. She is a former smoker and doesn't drink alcohol. She had menarche at age 36 and is still having periods. She is a Advertising copywriter. She has not had a colonoscopy or bone density study before.   Diagnostic mammogram: [facility] 02/08/21 EXAM: DIGITAL DIAGNOSTIC UNILATERAL RIGHT MAMMOGRAM WITH TOMOSYNTHESIS AND CAD; ULTRASOUND RIGHT BREAST LIMITED   TECHNIQUE: Right digital diagnostic mammography and breast tomosynthesis was performed. The images were evaluated with computer-aided detection.; Targeted ultrasound examination of the right breast was performed   COMPARISON: Previous exam(s).   ACR Breast Density Category c: The breast tissue is heterogeneously dense, which may obscure small masses.   FINDINGS: There is a persistent irregular mass within the upper-outer right breast, further evaluated with spot compression views.   Targeted  ultrasound is performed, showing a 1.1 x 1.2 x 0.8 cm irregular hypoechoic mass right breast 10 o'clock position 12 cm from nipple.   There is a cortically thickened right axillary lymph node measuring 9 mm.   IMPRESSION: Suspicious right breast mass 10 o'clock position.   Cortically thickened right axillary lymph node.   RECOMMENDATION: Ultrasound-guided core needle biopsy right breast mass 10 o'clock position.   Ultrasound-guided core needle biopsy cortically thickened right axillary lymph node.   I have discussed the findings and recommendations with the patient. If applicable, a reminder letter will be sent to the patient regarding the next appointment.   BI-RADS CATEGORY 4: Suspicious.  Pathology core needle biopsy: 02/11/21 1. Breast, right, needle core biopsy, 10 o'clock - INVASIVE DUCTAL CARCINOMA. SEE NOTE 2. Lymph node, needle/core biopsy, right axilla - METASTATIC CARCINOMA INVOLVING A LYMPH NODE. SEE NOTE Diagnosis Note 1. Carcinoma measures 0.8 cm in greatest linear dimension and appears grade 3.  Receptors: Estrogen Receptor: 70%, POSITIVE, WEAK STAINING INTENSITY Progesterone Receptor: 0%, NEGATIVE Proliferation Marker Ki67: 80% GROUP 2: HER2 **NEGATIVE**  Review of Systems: A complete review of systems was obtained from the patient. I have reviewed this information and discussed as appropriate with the patient. See HPI as well for other ROS.  Review of Systems  Cardiovascular: Positive for palpitations.  Endo/Heme/Allergies:  Hot flashes  Psychiatric/Behavioral: The patient has insomnia.    Medical History: Past Medical History:  Diagnosis Date   Arthritis   COPD (chronic obstructive pulmonary disease) (CMS-HCC)   Diabetes mellitus without complication (CMS-HCC)   Patient Active Problem  List  Diagnosis   Malignant neoplasm of upper-outer quadrant of right breast in female, estrogen receptor positive (CMS-HCC)   Family history of breast cancer  in sister   Breast cancer metastasized to axillary lymph node, right (CMS-HCC)   Past Surgical History:  Procedure Laterality Date   LAPAROSCOPIC TUBAL LIGATION   PHOTOREFRACTIVE KERATOTOMY/LASIK   wisdom teeth removal    No Known Allergies  Current Outpatient Medications on File Prior to Visit  Medication Sig Dispense Refill   aspirin 81 MG chewable tablet Take 81 mg by mouth once daily   cholecalciferol (VITAMIN D3) 1000 unit capsule Take 1,000 Units by mouth once daily   rosuvastatin (CRESTOR) 20 MG tablet Take 20 mg by mouth once daily   semaglutide (RYBELSUS) 3 mg tablet Take 3 mg by mouth once daily Do not cut, crush, or chew   No current facility-administered medications on file prior to visit.   Family History  Problem Relation Age of Onset   Diabetes Mother   Pancreatic cancer Father   Breast cancer Sister    Social History   Tobacco Use  Smoking Status Former Smoker  Smokeless Tobacco Never Used    Social History   Socioeconomic History   Marital status: Unknown  Tobacco Use   Smoking status: Former Smoker   Smokeless tobacco: Never Used  Vaping Use   Vaping Use: Never used  Substance and Sexual Activity   Alcohol use: Never   Drug use: Never   Objective:   Vitals:  02/23/21 1522  BP: (!) 147/82  Pulse: 94  Temp: 36.6 C (97.8 F)  Weight: (!) 106.3 kg (234 lb 6.4 oz)  Height: 157.5 cm (5' 2")   Body mass index is 42.87 kg/m.  Gen: No acute distress. Well nourished and well groomed.  Neurological: Alert and oriented to person, place, and time. Coordination normal.  Head: Normocephalic and atraumatic.  Eyes: Conjunctivae are normal. Pupils are equal, round, and reactive to light. No scleral icterus.  Neck: Normal range of motion. Neck supple. No tracheal deviation or thyromegaly present.  Cardiovascular: Normal rate, regular rhythm, normal heart sounds and intact distal pulses. Exam reveals no gallop and no friction rub. No murmur  heard. Breast: breasts symmetric bilaterally. Ptotic. No palpable masses. No skin dimpling. Some faint bruising on right breast. No nipple retraction or nipple discharge. No LAD.  Respiratory: Effort normal. No respiratory distress. No chest wall tenderness. Breath sounds normal. No wheezes, rales or rhonchi.  GI: Soft. Bowel sounds are normal. The abdomen is soft and nontender. There is no rebound and no guarding.  Musculoskeletal: Normal range of motion. Extremities are nontender.  Lymphadenopathy: No cervical, preauricular, postauricular or axillary adenopathy is present Skin: Skin is warm and dry. No rash noted. No diaphoresis. No erythema. No pallor. No clubbing, cyanosis, or edema.  Psychiatric: Normal mood and affect. Behavior is normal. Judgment and thought content normal.   Labs CBC essentially normal 02/23/21. CMET near normal other than blood sugar 158.   Assessment and Plan:   Malignant neoplasm of upper-outer quadrant of right breast in female, estrogen receptor positive (CMS-HCC) Pt has a new diagnosis of a cT1N1 right breast cancer. Due to the positive node and prognostic profile, we will plan neoadjuvant chemotherapy.   I will plan port placement. I reviewed this with the patient including steps of the procedure, recovery, and risks including malfunction and pneumothorax. She wishes to proceed.  She is being set up for MRI, chemo class, echo as well.   Her treatment will likely consist of chemo, seed localized right lumpectomy, seed targeted right axillary lymph node biopsy, and sentinel node biopsy. This would be followed by radiation and anti hormone therapy.  The MRI may complicate matters. I advised patient that this may lead to other biopsies.   She is also seeing genetics today due to her age and family history.   I have submitted orders for port.   Family history of breast cancer in sister See above, genetics referral  Breast cancer metastasized to axillary lymph  node, right (CMS-HCC) See above.  No follow-ups on file.  Milus Height, MD FACS Surgical Oncology, General Surgery, Trauma and [facility] [place]

## 2021-03-01 NOTE — Telephone Encounter (Signed)
Scheduled appts per 8/15 sch msg. Pt aware.  

## 2021-03-01 NOTE — Telephone Encounter (Signed)
Spoke to pt regarding [facility] from 8.10.22. Denies questions or concerns regarding dx or treatment care plan. Discussed change in echo time at [facility] on 8/17 arriving at 7:50am. Received verbal understanding. Denies further questions or concerns. Encourage pt to call with needs.

## 2021-03-02 ENCOUNTER — Other Ambulatory Visit (HOSPITAL_COMMUNITY): Payer: BC Managed Care – PPO

## 2021-03-02 ENCOUNTER — Ambulatory Visit (HOSPITAL_COMMUNITY): Payer: BC Managed Care – PPO | Admitting: Anesthesiology

## 2021-03-02 ENCOUNTER — Encounter (HOSPITAL_COMMUNITY): Payer: Self-pay | Admitting: General Surgery

## 2021-03-02 ENCOUNTER — Ambulatory Visit (HOSPITAL_BASED_OUTPATIENT_CLINIC_OR_DEPARTMENT_OTHER)
Admission: RE | Admit: 2021-03-02 | Discharge: 2021-03-02 | Disposition: A | Discharge status: Home / Self Care | Payer: BC Managed Care – PPO | Source: Ambulatory Visit | Attending: Hematology and Oncology | Admitting: Hematology and Oncology

## 2021-03-02 ENCOUNTER — Ambulatory Visit (HOSPITAL_COMMUNITY)
Admission: RE | Admit: 2021-03-02 | Discharge: 2021-03-02 | Disposition: A | Discharge status: Home / Self Care | Payer: BC Managed Care – PPO | Attending: General Surgery | Admitting: General Surgery

## 2021-03-02 ENCOUNTER — Telehealth: Payer: Self-pay

## 2021-03-02 ENCOUNTER — Ambulatory Visit (HOSPITAL_COMMUNITY): Payer: BC Managed Care – PPO

## 2021-03-02 ENCOUNTER — Other Ambulatory Visit: Payer: Self-pay

## 2021-03-02 ENCOUNTER — Encounter (HOSPITAL_COMMUNITY)
Admission: RE | Disposition: A | Discharge status: Home / Self Care | Payer: Self-pay | Source: Home / Self Care | Attending: General Surgery

## 2021-03-02 DIAGNOSIS — C50411 Malignant neoplasm of upper-outer quadrant of right female breast: Secondary | ICD-10-CM

## 2021-03-02 DIAGNOSIS — Z833 Family history of diabetes mellitus: Secondary | ICD-10-CM | POA: Diagnosis not present

## 2021-03-02 DIAGNOSIS — Z0189 Encounter for other specified special examinations: Secondary | ICD-10-CM

## 2021-03-02 DIAGNOSIS — Z79899 Other long term (current) drug therapy: Secondary | ICD-10-CM | POA: Insufficient documentation

## 2021-03-02 DIAGNOSIS — Z87891 Personal history of nicotine dependence: Secondary | ICD-10-CM | POA: Insufficient documentation

## 2021-03-02 DIAGNOSIS — E119 Type 2 diabetes mellitus without complications: Secondary | ICD-10-CM | POA: Insufficient documentation

## 2021-03-02 DIAGNOSIS — Z7982 Long term (current) use of aspirin: Secondary | ICD-10-CM | POA: Insufficient documentation

## 2021-03-02 DIAGNOSIS — J449 Chronic obstructive pulmonary disease, unspecified: Secondary | ICD-10-CM | POA: Insufficient documentation

## 2021-03-02 DIAGNOSIS — Z170 Estrogen receptor positive status [ER+]: Secondary | ICD-10-CM | POA: Diagnosis not present

## 2021-03-02 DIAGNOSIS — Z800 Family history of malignant neoplasm of digestive organs: Secondary | ICD-10-CM | POA: Diagnosis not present

## 2021-03-02 DIAGNOSIS — Z803 Family history of malignant neoplasm of breast: Secondary | ICD-10-CM | POA: Insufficient documentation

## 2021-03-02 HISTORY — DX: Palpitations: R00.2

## 2021-03-02 HISTORY — DX: Personal history of other diseases of the nervous system and sense organs: Z86.69

## 2021-03-02 HISTORY — PX: PORTACATH PLACEMENT: SHX2246

## 2021-03-02 LAB — ECHOCARDIOGRAM COMPLETE
Area-P 1/2: 7.99 cm2
Calc EF: 59.5 %
S' Lateral: 2.8 cm
Single Plane A2C EF: 65.3 %
Single Plane A4C EF: 55.4 %

## 2021-03-02 LAB — GLUCOSE, CAPILLARY
Glucose-Capillary: 124 mg/dL — ABNORMAL HIGH (ref 70–99)
Glucose-Capillary: 133 mg/dL — ABNORMAL HIGH (ref 70–99)

## 2021-03-02 LAB — HEMOGLOBIN A1C
Hgb A1c MFr Bld: 7.1 % — ABNORMAL HIGH (ref 4.8–5.6)
Mean Plasma Glucose: 157.07 mg/dL

## 2021-03-02 LAB — PREGNANCY, URINE: Preg Test, Ur: NEGATIVE

## 2021-03-02 SURGERY — INSERTION, TUNNELED CENTRAL VENOUS DEVICE, WITH PORT
Anesthesia: General | Site: Chest

## 2021-03-02 MED ORDER — BUPIVACAINE-EPINEPHRINE (PF) 0.25% -1:200000 IJ SOLN
INTRAMUSCULAR | Status: AC
  Filled 2021-03-02: qty 30

## 2021-03-02 MED ORDER — BUPIVACAINE-EPINEPHRINE 0.25% -1:200000 IJ SOLN
INTRAMUSCULAR | Status: DC | PRN
  Administered 2021-03-02: 6.5 mL

## 2021-03-02 MED ORDER — FENTANYL CITRATE (PF) 100 MCG/2ML IJ SOLN
INTRAMUSCULAR | Status: AC
  Filled 2021-03-02: qty 2

## 2021-03-02 MED ORDER — LIDOCAINE 2% (20 MG/ML) 5 ML SYRINGE
INTRAMUSCULAR | Status: DC | PRN
  Administered 2021-03-02: 100 mg via INTRAVENOUS

## 2021-03-02 MED ORDER — PROMETHAZINE HCL 25 MG/ML IJ SOLN: 6.2500 mg | INTRAMUSCULAR | Status: DC | PRN

## 2021-03-02 MED ORDER — LIDOCAINE HCL 1 % IJ SOLN
INTRAMUSCULAR | Status: DC | PRN
  Administered 2021-03-02: 6.5 mL

## 2021-03-02 MED ORDER — HEPARIN SOD (PORK) LOCK FLUSH 100 UNIT/ML IV SOLN
INTRAVENOUS | Status: AC
  Filled 2021-03-02: qty 5

## 2021-03-02 MED ORDER — ACETAMINOPHEN 500 MG PO TABS
1000.0000 mg | ORAL_TABLET | ORAL | Status: AC
  Administered 2021-03-02: 1000 mg via ORAL
  Filled 2021-03-02: qty 2

## 2021-03-02 MED ORDER — HEPARIN SOD (PORK) LOCK FLUSH 100 UNIT/ML IV SOLN
INTRAVENOUS | Status: DC | PRN
  Administered 2021-03-02: 500 [IU]

## 2021-03-02 MED ORDER — DEXMEDETOMIDINE (PRECEDEX) IN NS 20 MCG/5ML (4 MCG/ML) IV SYRINGE
PREFILLED_SYRINGE | INTRAVENOUS | Status: AC
  Filled 2021-03-02: qty 5

## 2021-03-02 MED ORDER — CHLORHEXIDINE GLUCONATE 0.12 % MT SOLN
15.0000 mL | Freq: Once | OROMUCOSAL | Status: AC
  Administered 2021-03-02: 15 mL via OROMUCOSAL

## 2021-03-02 MED ORDER — HEPARIN 6000 UNIT IRRIGATION SOLUTION
Freq: Once | Status: AC
  Administered 2021-03-02: 1
  Filled 2021-03-02: qty 6000

## 2021-03-02 MED ORDER — FENTANYL CITRATE (PF) 100 MCG/2ML IJ SOLN
INTRAMUSCULAR | Status: DC | PRN
  Administered 2021-03-02: 25 ug via INTRAVENOUS
  Administered 2021-03-02: 50 ug via INTRAVENOUS
  Administered 2021-03-02: 25 ug via INTRAVENOUS

## 2021-03-02 MED ORDER — LIDOCAINE-PRILOCAINE 2.5-2.5 % EX CREA: 1.0000 "application " | TOPICAL_CREAM | CUTANEOUS | 0 refills | Status: DC | PRN

## 2021-03-02 MED ORDER — CHLORHEXIDINE GLUCONATE CLOTH 2 % EX PADS: 6.0000 | MEDICATED_PAD | Freq: Once | CUTANEOUS | Status: DC

## 2021-03-02 MED ORDER — OXYCODONE HCL 5 MG PO TABS: 5.0000 mg | ORAL_TABLET | Freq: Once | ORAL | Status: DC | PRN

## 2021-03-02 MED ORDER — LIDOCAINE HCL (PF) 1 % IJ SOLN
INTRAMUSCULAR | Status: AC
  Filled 2021-03-02: qty 30

## 2021-03-02 MED ORDER — PROPOFOL 500 MG/50ML IV EMUL
INTRAVENOUS | Status: DC | PRN
  Administered 2021-03-02: 200 mg via INTRAVENOUS

## 2021-03-02 MED ORDER — LIDOCAINE 2% (20 MG/ML) 5 ML SYRINGE
INTRAMUSCULAR | Status: AC
  Filled 2021-03-02: qty 5

## 2021-03-02 MED ORDER — PROPOFOL 10 MG/ML IV BOLUS
INTRAVENOUS | Status: AC
  Filled 2021-03-02: qty 20

## 2021-03-02 MED ORDER — MIDAZOLAM HCL 2 MG/2ML IJ SOLN
INTRAMUSCULAR | Status: DC | PRN
  Administered 2021-03-02: 2 mg via INTRAVENOUS

## 2021-03-02 MED ORDER — CEFAZOLIN SODIUM-DEXTROSE 2-4 GM/100ML-% IV SOLN
2.0000 g | INTRAVENOUS | Status: AC
  Administered 2021-03-02: 2 g via INTRAVENOUS
  Filled 2021-03-02: qty 100

## 2021-03-02 MED ORDER — ONDANSETRON HCL 4 MG/2ML IJ SOLN
INTRAMUSCULAR | Status: DC | PRN
  Administered 2021-03-02: 4 mg via INTRAVENOUS

## 2021-03-02 MED ORDER — DEXAMETHASONE SODIUM PHOSPHATE 10 MG/ML IJ SOLN
INTRAMUSCULAR | Status: AC
  Filled 2021-03-02: qty 1

## 2021-03-02 MED ORDER — DEXAMETHASONE SODIUM PHOSPHATE 10 MG/ML IJ SOLN
INTRAMUSCULAR | Status: DC | PRN
  Administered 2021-03-02: 10 mg via INTRAVENOUS

## 2021-03-02 MED ORDER — LACTATED RINGERS IV SOLN: INTRAVENOUS | Status: DC

## 2021-03-02 MED ORDER — ORAL CARE MOUTH RINSE: 15.0000 mL | Freq: Once | OROMUCOSAL | Status: AC

## 2021-03-02 MED ORDER — OXYCODONE HCL 5 MG PO TABS: 5.0000 mg | ORAL_TABLET | Freq: Four times a day (QID) | ORAL | 0 refills | Status: DC | PRN

## 2021-03-02 MED ORDER — MIDAZOLAM HCL 2 MG/2ML IJ SOLN
INTRAMUSCULAR | Status: AC
  Filled 2021-03-02: qty 2

## 2021-03-02 MED ORDER — OXYCODONE HCL 5 MG/5ML PO SOLN: 5.0000 mg | Freq: Once | ORAL | Status: DC | PRN

## 2021-03-02 MED ORDER — DEXMEDETOMIDINE (PRECEDEX) IN NS 20 MCG/5ML (4 MCG/ML) IV SYRINGE
PREFILLED_SYRINGE | INTRAVENOUS | Status: DC | PRN
  Administered 2021-03-02: 12 ug via INTRAVENOUS

## 2021-03-02 MED ORDER — ONDANSETRON HCL 4 MG/2ML IJ SOLN
INTRAMUSCULAR | Status: AC
  Filled 2021-03-02: qty 2

## 2021-03-02 MED ORDER — FENTANYL CITRATE (PF) 100 MCG/2ML IJ SOLN: 25.0000 ug | INTRAMUSCULAR | Status: DC | PRN

## 2021-03-02 MED ORDER — PHENYLEPHRINE 40 MCG/ML (10ML) SYRINGE FOR IV PUSH (FOR BLOOD PRESSURE SUPPORT)
PREFILLED_SYRINGE | INTRAVENOUS | Status: DC | PRN
  Administered 2021-03-02 (×3): 80 ug via INTRAVENOUS

## 2021-03-02 MED FILL — Fosaprepitant Dimeglumine For IV Infusion 150 MG (Base Eq): INTRAVENOUS | Qty: 5 | Status: AC

## 2021-03-02 MED FILL — Dexamethasone Sodium Phosphate Inj 100 MG/10ML: INTRAMUSCULAR | Qty: 1 | Status: AC

## 2021-03-02 SURGICAL SUPPLY — 37 items
ADH SKN CLS APL DERMABOND .7 (GAUZE/BANDAGES/DRESSINGS) ×1
APL PRP STRL LF DISP 70% ISPRP (MISCELLANEOUS) ×1
BAG COUNTER SPONGE SURGICOUNT (BAG) IMPLANT
BAG DECANTER FOR FLEXI CONT (MISCELLANEOUS) IMPLANT
BAG SPNG CNTER NS LX DISP (BAG)
BLADE HEX COATED 2.75 (ELECTRODE) IMPLANT
BLADE SURG 15 STRL LF DISP TIS (BLADE) ×1 IMPLANT
BLADE SURG 15 STRL SS (BLADE) ×2
BLADE SURG SZ11 CARB STEEL (BLADE) ×2 IMPLANT
CHLORAPREP W/TINT 26 (MISCELLANEOUS) ×2 IMPLANT
COVER SURGICAL LIGHT HANDLE (MISCELLANEOUS) ×2 IMPLANT
DECANTER SPIKE VIAL GLASS SM (MISCELLANEOUS) ×4 IMPLANT
DERMABOND ADVANCED (GAUZE/BANDAGES/DRESSINGS) ×1
DERMABOND ADVANCED .7 DNX12 (GAUZE/BANDAGES/DRESSINGS) ×1 IMPLANT
DRAPE C-ARM 42X120 X-RAY (DRAPES) ×2 IMPLANT
DRAPE LAPAROTOMY TRNSV 102X78 (DRAPES) ×2 IMPLANT
DRAPE UTILITY XL STRL (DRAPES) ×2 IMPLANT
ELECT REM PT RETURN 15FT ADLT (MISCELLANEOUS) ×2 IMPLANT
GAUZE 4X4 16PLY ~~LOC~~+RFID DBL (SPONGE) ×2 IMPLANT
GLOVE SURG ENC MOIS LTX SZ6 (GLOVE) ×2 IMPLANT
GLOVE SURG UNDER LTX SZ6.5 (GLOVE) ×2 IMPLANT
GOWN STRL REUS W/TWL 2XL LVL3 (GOWN DISPOSABLE) ×2 IMPLANT
GOWN STRL REUS W/TWL XL LVL3 (GOWN DISPOSABLE) ×4 IMPLANT
KIT BASIN OR (CUSTOM PROCEDURE TRAY) ×2 IMPLANT
KIT PORT POWER 8FR ISP CVUE (Port) ×2 IMPLANT
KIT TURNOVER KIT A (KITS) ×2 IMPLANT
NEEDLE HYPO 22GX1.5 SAFETY (NEEDLE) ×2 IMPLANT
PACK BASIC VI WITH GOWN DISP (CUSTOM PROCEDURE TRAY) ×2 IMPLANT
PENCIL SMOKE EVACUATOR (MISCELLANEOUS) IMPLANT
SUT MNCRL AB 4-0 PS2 18 (SUTURE) ×2 IMPLANT
SUT PROLENE 2 0 SH DA (SUTURE) ×4 IMPLANT
SUT VIC AB 3-0 SH 27 (SUTURE) ×2
SUT VIC AB 3-0 SH 27X BRD (SUTURE) ×1 IMPLANT
SYR 10ML LL (SYRINGE) ×2 IMPLANT
SYR CONTROL 10ML LL (SYRINGE) ×2 IMPLANT
TOWEL OR 17X26 10 PK STRL BLUE (TOWEL DISPOSABLE) ×2 IMPLANT
TOWEL OR NON WOVEN STRL DISP B (DISPOSABLE) ×2 IMPLANT

## 2021-03-02 NOTE — Transfer of Care (Signed)
Immediate Anesthesia Transfer of Care Note  Patient: Sandra Bishop  Procedure(s) Performed: Procedure(s): INSERTION PORT-A-CATH (N/A)  Patient Location: PACU  Anesthesia Type:General  Level of Consciousness: Alert, Awake, Oriented  Airway & Oxygen Therapy: Patient Spontanous Breathing  Post-op Assessment: Report given to RN  Post vital signs: Reviewed and stable  Last Vitals:  Vitals:   03/02/21 0857  BP: (!) 154/83  Pulse: 86  Resp: 15  Temp: 36.6 C  SpO2: [id number]    Complications: No apparent anesthesia complications

## 2021-03-02 NOTE — Telephone Encounter (Signed)
Called and left message for Mrs. Langdon about moving her appt up on 8/19 to earlier in the day.  I informed her that 1000 was the earliest I could move it and I did move it to that time.  I told her if that did not work, we could put her back in the afternoon. Gardiner Rhyme, RN

## 2021-03-02 NOTE — Progress Notes (Signed)
Patient Care Team: [name], MD as PCP - General (Family Medicine) [name], RN as Oncology Nurse Navigator [name], RN as Oncology Nurse Navigator [name], MD as Consulting Physician (General Surgery) [name], MD as Consulting Physician (Hematology and Oncology) [name], MD as Consulting Physician (Radiation Oncology)  DIAGNOSIS:    ICD-10-CM   1. Malignant neoplasm of upper-outer quadrant of right breast in female, estrogen receptor positive ([place])  C50.411    Z17.0       SUMMARY OF ONCOLOGIC HISTORY: Oncology History  Malignant neoplasm of upper-outer quadrant of right breast in female, estrogen receptor positive ([place])  02/15/2021 Initial Diagnosis   Screening mammogram: a possible mass in the right breast  Diagnostic mammogram and [place]: a persistent irregular mass 1.2 cm within the upper-outer right breast. 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    02/23/2021 Cancer Staging   Staging form: Breast, AJCC 8th Edition - Clinical stage from 02/23/2021: Stage IIB (cT1c, cN1(f), cM0, G3, ER+, PR-, HER2-) - Signed by [name], MD on 02/23/2021 Stage prefix: Initial diagnosis Method of lymph node assessment: Core biopsy Histologic grading system: 3 grade system   03/03/2021 -  Chemotherapy    Patient is on Treatment Plan: BREAST ADJUVANT DOSE DENSE AC Q14D / PACLITAXEL Q7D         CHIEF COMPLIANT: Cycle 1 AC (will start 03/04/2021)  INTERVAL HISTORY: Sandra Bishop is a 50 y.o. with above-mentioned history of invasive ductal carcinoma of the right breast, to start neoadjuvant chemotherapy with Adriamycin and Cytoxan dose dense. She we will start tomorrow with cycle 1 of chemo.  She is slightly anxious and was not able to sleep well.  Her blood pressure is running high.  ALLERGIES:  has No Known Allergies.  MEDICATIONS:  Current Outpatient Medications   Medication Sig Dispense Refill   aspirin EC 81 MG tablet Take 81 mg by mouth daily. Swallow whole.     blood glucose meter kit and supplies Dispense based on patient and insurance preference. Use up to four times daily as directed. (FOR ICD-10 E10.9, E11.9).  ( One-Touch Verio meter) 1 each 0   Cholecalciferol (VITAMIN D3) 50 MCG (2000 UT) TABS Take 2,000 Units by mouth daily.     dexamethasone (DECADRON) 4 MG tablet Take 1 tablet (4 mg total) by mouth daily. Take 1 tablet day after chemo and 1 tablet 2 days after chemo with food 8 tablet 0   Lancets (ONETOUCH ULTRASOFT) lancets Use to check blood glucose three times daily. Use as instructed. E11.9. 100 each 12   lidocaine-prilocaine (EMLA) cream Apply to affected area once 30 g 3   lidocaine-prilocaine (EMLA) cream Apply 1 application topically as needed. 30 g 0   naproxen sodium (ALEVE) 220 MG tablet Take 440 mg by mouth 2 (two) times daily as needed (pain.).     ondansetron (ZOFRAN) 8 MG tablet Take 1 tablet (8 mg total) by mouth 2 (two) times daily as needed. Start on the third day after chemotherapy. 30 tablet 1   ONETOUCH VERIO test strip TEST FOUR TIMES DAILY AS DIRECTED 100 strip 2   oxyCODONE (OXY IR/ROXICODONE) 5 MG immediate release tablet Take 1 tablet (5 mg total) by mouth every 6 (six) hours as needed for severe pain. 8 tablet 0   prochlorperazine (COMPAZINE) 10 MG tablet Take 1 tablet (10 mg total) by mouth every 6 (six) hours  as needed (Nausea or vomiting). 30 tablet 1   rosuvastatin (CRESTOR) 5 MG tablet Take 5 mg by mouth every Tuesday.     Semaglutide (RYBELSUS) 3 MG TABS Take 3 mg by mouth daily before breakfast.     Turmeric 450 MG CAPS Take 900 mg by mouth daily.     No current facility-administered medications for this visit.    PHYSICAL EXAMINATION: ECOG PERFORMANCE STATUS: 0 - Asymptomatic  There were no vitals filed for this visit. There were no vitals filed for this visit.    LABORATORY DATA:  I have  reviewed the data as listed CMP Latest Ref Rng & Units 02/23/2021 12/22/2020 08/11/2020  Glucose 70 - 99 mg/dL 158(H) 149(H) 119(H)  BUN 6 - 20 mg/dL _0 Creatinine 0.44 - 1.00 mg/dL 0.86 0.69 0.74  Sodium 135 - 145 mmol/L 136 135 140  Potassium 3.5 - 5.1 mmol/L 4.0 3.6 4.3  Chloride 98 - 111 mmol/L 106 101 104  CO2 22 - 32 mmol/L 21(L) 24 25  Calcium 8.9 - 10.3 mg/dL 9.4 8.9 9.2  Total Protein 6.5 - 8.1 g/dL 8.4(H) 7.3 8.0  Total Bilirubin 0.3 - 1.2 mg/dL 0.3 0.3 <0.2  Alkaline Phos 38 - 126 U/L 82 61 83  AST 15 - 41 U/L 12(L) 11(L) 14  ALT 0 - 44 U/L _1 Lab Results  Component Value Date   WBC 8.0 02/23/2021   HGB 11.9 (L) 02/23/2021   HCT 37.0 02/23/2021   MCV 79.7 (L) 02/23/2021   PLT 301 02/23/2021   NEUTROABS 4.9 02/23/2021    ASSESSMENT & PLAN:  Malignant neoplasm of upper-outer quadrant of right breast in female, estrogen receptor positive ([place]) 02/15/2021:Screening mammogram: a possible mass in the right breast  Diagnostic mammogram and [place]: a persistent irregular mass 1.2 cm within the upper-outer right breast. 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   Recommendation based on multidisciplinary tumor board: 1. Neoadjuvant chemotherapy with Adriamycin and Cytoxan dose dense 4 followed by Taxol weekly 12  2. Followed by breast conserving surgery with  targeted axillary dissection 3. Followed by adjuvant radiation therapy 4.  Followed by adjuvant antiestrogen therapy  Breast MRI 02/28/2021: Biopsy-proven malignancy 1.8 cm right breast, additional 4.3 cm area of non-mass enhancement (multiple additional oval masses in the right breast: 6 mm, 8 mm, 8 mm, 1 cm), left breast 0.8 cm oval mass indeterminate, 1 enlarged right axillary lymph node (recommendation: Biopsy of non-mass enhancement, biopsy of 2 indeterminate right breast masses and biopsy of left  breast) ------------------------------------------------------------------------------------------------------------------------------------ Current treatment: Cycle 1 day 1 dose dense Adriamycin and Cytoxan (she started 03/04/2021) Labs reviewed, chemo class completed, chemo consent obtained, antiemetics were reviewed. Echocardiogram 03/02/2021: EF 60 to 65%  Because of her diabetes we decided to discontinue dexamethasone.  Return to clinic in 1 week for toxicity check    No orders of the defined types were placed in this encounter.  The patient has a good understanding of the overall plan. she agrees with it. she will call with any problems that may develop before the next visit here.  Total time spent: 30 mins including face to face time and time spent for planning, charting and coordination of care  Rulon Eisenmenger, MD, MPH 03/03/2021  I, Thana Ates, am acting as scribe for Dr. [name].  I have reviewed the above documentation for accuracy and completeness, and I  agree with the above.

## 2021-03-02 NOTE — Anesthesia Procedure Notes (Signed)
Procedure Name: LMA Insertion Date/Time: 03/02/2021 10:22 AM Performed by: Gerald Leitz, CRNA Pre-anesthesia Checklist: Patient identified, Patient being monitored, Timeout performed, Emergency Drugs available and Suction available Patient Re-evaluated:Patient Re-evaluated prior to induction Oxygen Delivery Method: Circle system utilized Preoxygenation: Pre-oxygenation with 100% oxygen Induction Type: IV induction Ventilation: Mask ventilation without difficulty LMA: LMA inserted LMA Size: 4.0 Tube type: Oral Number of attempts: 1 Placement Confirmation: positive ETCO2 and breath sounds checked- equal and bilateral Tube secured with: Tape Dental Injury: Teeth and Oropharynx as per pre-operative assessment

## 2021-03-02 NOTE — Anesthesia Preprocedure Evaluation (Addendum)
Anesthesia Evaluation  Patient identified by MRN, date of birth, ID band Patient awake    Reviewed: Allergy & Precautions, NPO status , Patient's Chart, lab work & pertinent test results  History of Anesthesia Complications Negative for: history of anesthetic complications  Airway Mallampati: II  TM Distance: >3 FB Neck ROM: Full    Dental  (+) Dental Advisory Given, Teeth Intact   Pulmonary former smoker,    Pulmonary exam normal        Cardiovascular negative cardio ROS Normal cardiovascular exam     Neuro/Psych negative neurological ROS  negative psych ROS   GI/Hepatic negative GI ROS, Neg liver ROS,   Endo/Other  diabetes, Type 2, Oral Hypoglycemic AgentsMorbid obesity  Renal/GU negative Renal ROS     Musculoskeletal negative musculoskeletal ROS (+)   Abdominal   Peds  Hematology negative hematology ROS (+)   Anesthesia Other Findings   Reproductive/Obstetrics  Breast cancer                             Anesthesia Physical Anesthesia Plan  ASA: 3  Anesthesia Plan: General   Post-op Pain Management:    Induction: Intravenous  PONV Risk Score and Plan: 3 and Treatment may vary due to age or medical condition, Ondansetron, Dexamethasone and Midazolam  Airway Management Planned: LMA  Additional Equipment: None  Intra-op Plan:   Post-operative Plan: Extubation in OR  Informed Consent: I have reviewed the patients History and Physical, chart, labs and discussed the procedure including the risks, benefits and alternatives for the proposed anesthesia with the patient or authorized representative who has indicated his/her understanding and acceptance.     Dental advisory given  Plan Discussed with: CRNA and Anesthesiologist  Anesthesia Plan Comments:        Anesthesia Quick Evaluation

## 2021-03-02 NOTE — Discharge Instructions (Addendum)
[place] Office Phone Number [phone]   POST OP INSTRUCTIONS  Always review your discharge instruction sheet given to you by the facility where your surgery was performed.  IF YOU HAVE DISABILITY OR FAMILY LEAVE FORMS, YOU MUST BRING THEM TO THE OFFICE FOR PROCESSING.  DO NOT GIVE THEM TO YOUR DOCTOR.  A prescription for pain medication may be given to you upon discharge.  Take your pain medication as prescribed, if needed.  If narcotic pain medicine is not needed, then you may take acetaminophen (Tylenol) or ibuprofen (Advil) as needed. Take your usually prescribed medications unless otherwise directed If you need a refill on your pain medication, please contact your pharmacy.  They will contact our office to request authorization.  Prescriptions will not be filled after 5pm or on week-ends. You should eat very light the first 24 hours after surgery, such as soup, crackers, pudding, etc.  Resume your normal diet the day after surgery It is common to experience some constipation if taking pain medication after surgery.  Increasing fluid intake and taking a stool softener will usually help or prevent this problem from occurring.  A mild laxative (Milk of Magnesia or Miralax) should be taken according to package directions if there are no bowel movements after 48 hours. You may shower in 48 hours.  The surgical glue will flake off in 2-3 weeks.   ACTIVITIES:  No strenuous activity or heavy lifting for 1 week.   You may drive when you no longer are taking prescription pain medication, you can comfortably wear a seatbelt, and you can safely maneuver your car and apply brakes. RETURN TO WORK:  __________as tolerated if no lifting for 1 week.  _______________ Sandra Bishop should see your doctor in the office for a follow-up appointment approximately three-four weeks after your surgery.    WHEN TO CALL YOUR DOCTOR: Fever over 101.0 Nausea and/or vomiting. Extreme swelling or  bruising. Continued bleeding from incision. Increased pain, redness, or drainage from the incision.  The clinic staff is available to answer your questions during regular business hours.  Please don't hesitate to call and ask to speak to one of the nurses for clinical concerns.  If you have a medical emergency, go to the nearest emergency room or call 911.  A surgeon from [facility] is always on call at the hospital.  For further questions, please visit [email or web address]

## 2021-03-02 NOTE — Progress Notes (Signed)
  Echocardiogram 2D Echocardiogram has been performed.  Michiel Cowboy 03/02/2021, 8:41 AM

## 2021-03-02 NOTE — Op Note (Signed)
PREOPERATIVE DIAGNOSIS:  right breast cancer     POSTOPERATIVE DIAGNOSIS:  Same     PROCEDURE: Left subclavian port placement, Bard ClearVue Power port, 8-French.      SURGEON:  [name], MD      ANESTHESIA:  General   FINDINGS:  Good venous return, easy flush, and tip of the catheter      SPECIMEN:  None.      ESTIMATED BLOOD LOSS:  Minimal.      COMPLICATIONS:  None known.      PROCEDURE:  Pt was identified in the holding area and taken to   the operating room, where patient was placed supine on the operating room   table.  General anesthesia was induced.  Patient's arms were tucked and the upper   chest and neck were prepped and draped in sterile fashion.  Time-out was   performed according to the surgical safety check list.  When all was   correct, we continued.     Local anesthetic was administered in the skin just inferior to the angle of the clavicle.  The vein was accessed with 1 pass(es) of the needle. There was good venous return but the wire curled up in the neck and could not be salvaged.  One additional stick was required to access the left subclavian vein.  Brief ectopy occurred.   Fluoroscopy was used to confirm that the wire was in the vena cava.      The patient was placed back level and the area for the pocket was anethetized   with local anesthetic.  A 3-cm transverse incision was made with a #15   blade.  Cautery was used to divide the subcutaneous tissues down to the   pectoralis muscle.  An [employer] retractor was used to elevate the skin   while a pocket was created on top of the pectoralis fascia.  The port   was placed into the pocket to confirm that it was of adequate size.  The   catheter was preattached to the port.  The port was then secured to the   pectoralis fascia with four 2-0 Prolene sutures.  These were clamped and   not tied down yet.    The catheter was placed along the wire to determine what length it should be to be in the SVC.  The  catheter was cut at an appropriate language.  The tunneler sheath and dilator were passed over the wire and the dilator and wire were removed.  The catheter was advanced through the tunneler sheath and the tunneler sheath was pulled away.  Care was taken to keep the catheter in the tunneler sheath as this occurred. This was advanced and the tunneler sheath was removed.  There was good venous  return and easy flush of the catheter.  However, the catheter appeared to be in the brachiocephalic vein.  The wire was replaced in the catheter and a longer length of catheter was used.  This was a good length and was in the SVC.  The Prolene sutures were tied  down to the pectoral fascia.  The skin was reapproximated using 3-0   Vicryl interrupted deep dermal sutures.    Fluoroscopy was used to re-confirm good position of the catheter.  The skin   was then closed using 4-0 Monocryl in a subcuticular fashion.  The port was flushed with concentrated heparin flush as well.  The wounds were then cleaned, dried, and dressed with Dermabond.  The patient was  awakened from anesthesia and taken to the PACU in stable condition.  Needle, sponge, and instrument counts were correct.               [name], MD

## 2021-03-02 NOTE — Anesthesia Postprocedure Evaluation (Signed)
Anesthesia Post Note  Patient: Sandra Bishop  Procedure(s) Performed: INSERTION PORT-A-CATH (Chest)     Patient location during evaluation: PACU Anesthesia Type: General Level of consciousness: awake and alert Pain management: pain level controlled Vital Signs Assessment: post-procedure vital signs reviewed and stable Respiratory status: spontaneous breathing, nonlabored ventilation and respiratory function stable Cardiovascular status: stable and blood pressure returned to baseline Anesthetic complications: no   No notable events documented.  Last Vitals:  Vitals:   03/02/21 1200 03/02/21 1215  BP: 136/90 (!) 136/91  Pulse: 82 83  Resp: 19 19  Temp:  (!) 36.4 C  SpO2: 100% 97%    Last Pain:  Vitals:   03/02/21 1215  TempSrc:   PainSc: 0-No pain                 Audry Pili

## 2021-03-02 NOTE — Interval H&P Note (Signed)
History and Physical Interval Note:  03/02/2021 8:43 AM  Sandra Bishop  has presented today for surgery, with the diagnosis of RIGHT BREAST CANCER.  The various methods of treatment have been discussed with the patient and family. After consideration of risks, benefits and other options for treatment, the patient has consented to  Procedure(s): INSERTION PORT-A-CATH (N/A) as a surgical intervention.  The patient's history has been reviewed, patient examined, no change in status, stable for surgery.  I have reviewed the patient's chart and labs.  Questions were answered to the patient's satisfaction.     [name]

## 2021-03-03 ENCOUNTER — Encounter: Payer: Self-pay | Admitting: Emergency Medicine

## 2021-03-03 ENCOUNTER — Encounter: Payer: Self-pay | Admitting: Hematology and Oncology

## 2021-03-03 ENCOUNTER — Inpatient Hospital Stay (HOSPITAL_BASED_OUTPATIENT_CLINIC_OR_DEPARTMENT_OTHER): Payer: BC Managed Care – PPO | Admitting: Hematology and Oncology

## 2021-03-03 ENCOUNTER — Inpatient Hospital Stay: Payer: BC Managed Care – PPO

## 2021-03-03 ENCOUNTER — Encounter (HOSPITAL_COMMUNITY): Payer: Self-pay | Admitting: General Surgery

## 2021-03-03 DIAGNOSIS — C50411 Malignant neoplasm of upper-outer quadrant of right female breast: Secondary | ICD-10-CM

## 2021-03-03 DIAGNOSIS — Z95828 Presence of other vascular implants and grafts: Secondary | ICD-10-CM | POA: Insufficient documentation

## 2021-03-03 DIAGNOSIS — Z170 Estrogen receptor positive status [ER+]: Secondary | ICD-10-CM | POA: Diagnosis not present

## 2021-03-03 DIAGNOSIS — Z5111 Encounter for antineoplastic chemotherapy: Secondary | ICD-10-CM | POA: Diagnosis not present

## 2021-03-03 LAB — CBC WITH DIFFERENTIAL/PLATELET
Abs Immature Granulocytes: 0.05 10*3/uL (ref 0.00–0.07)
Basophils Absolute: 0 10*3/uL (ref 0.0–0.1)
Basophils Relative: 0 %
Eosinophils Absolute: 0 10*3/uL (ref 0.0–0.5)
Eosinophils Relative: 0 %
HCT: 32.9 % — ABNORMAL LOW (ref 36.0–46.0)
Hemoglobin: 10.4 g/dL — ABNORMAL LOW (ref 12.0–15.0)
Immature Granulocytes: 0 %
Lymphocytes Relative: 22 %
Lymphs Abs: 3 10*3/uL (ref 0.7–4.0)
MCH: 25.7 pg — ABNORMAL LOW (ref 26.0–34.0)
MCHC: 31.6 g/dL (ref 30.0–36.0)
MCV: 81.4 fL (ref 80.0–100.0)
Monocytes Absolute: 1.7 10*3/uL — ABNORMAL HIGH (ref 0.1–1.0)
Monocytes Relative: 13 %
Neutro Abs: 8.6 10*3/uL — ABNORMAL HIGH (ref 1.7–7.7)
Neutrophils Relative %: 65 %
Platelets: 286 10*3/uL (ref 150–400)
RBC: 4.04 MIL/uL (ref 3.87–5.11)
RDW: 14.7 % (ref 11.5–15.5)
WBC: 13.4 10*3/uL — ABNORMAL HIGH (ref 4.0–10.5)
nRBC: 0 % (ref 0.0–0.2)

## 2021-03-03 LAB — COMPREHENSIVE METABOLIC PANEL
ALT: 13 U/L (ref 0–44)
AST: 13 U/L — ABNORMAL LOW (ref 15–41)
Albumin: 3.4 g/dL — ABNORMAL LOW (ref 3.5–5.0)
Alkaline Phosphatase: 71 U/L (ref 38–126)
Anion gap: 8 (ref 5–15)
BUN: 10 mg/dL (ref 6–20)
CO2: 23 mmol/L (ref 22–32)
Calcium: 8.7 mg/dL — ABNORMAL LOW (ref 8.9–10.3)
Chloride: 107 mmol/L (ref 98–111)
Creatinine, Ser: 0.82 mg/dL (ref 0.44–1.00)
GFR, Estimated: >60 mL/min (ref >60)
Glucose, Bld: 137 mg/dL — ABNORMAL HIGH (ref 70–99)
Potassium: 3.6 mmol/L (ref 3.5–5.1)
Sodium: 138 mmol/L (ref 135–145)
Total Bilirubin: 0.2 mg/dL — ABNORMAL LOW (ref 0.3–1.2)
Total Protein: 7.5 g/dL (ref 6.5–8.1)

## 2021-03-03 MED ORDER — HEPARIN SOD (PORK) LOCK FLUSH 100 UNIT/ML IV SOLN
500.0000 [IU] | Freq: Once | INTRAVENOUS | Status: AC
  Administered 2021-03-03: 500 [IU]

## 2021-03-03 MED ORDER — SODIUM CHLORIDE 0.9% FLUSH
10.0000 mL | Freq: Once | INTRAVENOUS | Status: AC
  Administered 2021-03-03: 10 mL

## 2021-03-03 MED ORDER — TRAZODONE HCL 50 MG PO TABS: 50.0000 mg | ORAL_TABLET | Freq: Every day | ORAL | Status: DC

## 2021-03-03 NOTE — Research (Signed)
DCP-001: Use of a Clinical Trial Screening Tool to Address Cancer Health Disparities in the [facility])  Patient Sandra Bishop was identified by this Research officer, political party as a potential candidate for the above listed study.  This Clinical Research Coordinator met with Sandra Bishop, [id number], on 03/03/21 in a manner and location that ensures patient privacy to discuss participation in the above listed research study.  A copy of the informed consent document and separate HIPAA Authorization was provided to the patient.  Patient reads, speaks, and understands [place].    Patient has the contact information for this research coordinator and was encouraged to contact the research team with any questions.  Patient took the consent documents home to review.  Will follow up with patient at one of her next appointments to review consent documents and determine if patient wants to participate in this study.  Clabe Seal Clinical Research Coordinator I  03/03/21 12:04 PM

## 2021-03-03 NOTE — Assessment & Plan Note (Signed)
02/15/2021:Screening mammogram: a possible mass in the right breast  Diagnostic mammogram and [place]: a persistent irregular mass 1.2 cm within the upper-outer right breast. 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   Recommendation based on multidisciplinary tumor board: 1. Neoadjuvant chemotherapy with Adriamycin and Cytoxan dose dense 4 followed by Taxol weekly 12  2. Followed by breast conserving surgery with  targeted axillary dissection 3. Followed by adjuvant radiation therapy 4.  Followed by adjuvant antiestrogen therapy  Breast MRI 02/28/2021: Biopsy-proven malignancy 1.8 cm right breast, additional 4.3 cm area of non-mass enhancement (multiple additional oval masses in the right breast: 6 mm, 8 mm, 8 mm, 1 cm), left breast 0.8 cm oval mass indeterminate, 1 enlarged right axillary lymph node (recommendation: Biopsy of non-mass enhancement, biopsy of 2 indeterminate right breast masses and biopsy of left breast) ------------------------------------------------------------------------------------------------------------------------------------ Current treatment: Cycle 1 day 1 dose dense Adriamycin and Cytoxan Labs reviewed, chemo class completed, chemo consent obtained, antiemetics were reviewed. Echocardiogram 03/02/2021: EF 60 to 65%  Return to clinic in 1 week for toxicity check

## 2021-03-03 NOTE — Progress Notes (Signed)
Met with patient at registration to introduce myself as Financial Resource Specialist and to offer available resources.  Discussed one-time $1000 [employer] grant and qualifications to assist with personal expenses while going through treatment.  Also, discussed available copay assistance if she has not met her ded/OOP for this year for specific infused medication.  Gave her my card if interested in applying and for any additional financial questions or concerns. 

## 2021-03-04 ENCOUNTER — Other Ambulatory Visit: Payer: Self-pay | Admitting: General Surgery

## 2021-03-04 ENCOUNTER — Inpatient Hospital Stay: Payer: BC Managed Care – PPO

## 2021-03-04 ENCOUNTER — Other Ambulatory Visit: Payer: Self-pay

## 2021-03-04 ENCOUNTER — Encounter: Payer: Self-pay | Admitting: *Deleted

## 2021-03-04 VITALS — BP 142/88 | HR 93 | Temp 98.7°F | Resp 16 | Wt 236.0 lb

## 2021-03-04 DIAGNOSIS — Z5111 Encounter for antineoplastic chemotherapy: Secondary | ICD-10-CM | POA: Diagnosis not present

## 2021-03-04 DIAGNOSIS — Z170 Estrogen receptor positive status [ER+]: Secondary | ICD-10-CM

## 2021-03-04 DIAGNOSIS — C50411 Malignant neoplasm of upper-outer quadrant of right female breast: Secondary | ICD-10-CM

## 2021-03-04 MED ORDER — SODIUM CHLORIDE 0.9% FLUSH
10.0000 mL | INTRAVENOUS | Status: DC | PRN
  Administered 2021-03-04: 10 mL

## 2021-03-04 MED ORDER — HEPARIN SOD (PORK) LOCK FLUSH 100 UNIT/ML IV SOLN
500.0000 [IU] | Freq: Once | INTRAVENOUS | Status: AC | PRN
  Administered 2021-03-04: 500 [IU]

## 2021-03-04 MED ORDER — PALONOSETRON HCL INJECTION 0.25 MG/5ML
0.2500 mg | Freq: Once | INTRAVENOUS | Status: AC
  Administered 2021-03-04: 0.25 mg via INTRAVENOUS
  Filled 2021-03-04: qty 5

## 2021-03-04 MED ORDER — SODIUM CHLORIDE 0.9 % IV SOLN
600.0000 mg/m2 | Freq: Once | INTRAVENOUS | Status: AC
  Administered 2021-03-04: 1300 mg via INTRAVENOUS
  Filled 2021-03-04: qty 65

## 2021-03-04 MED ORDER — SODIUM CHLORIDE 0.9 % IV SOLN
Freq: Once | INTRAVENOUS | Status: AC
Start: 2021-03-04 — End: 2021-03-04

## 2021-03-04 MED ORDER — SODIUM CHLORIDE 0.9 % IV SOLN
10.0000 mg | Freq: Once | INTRAVENOUS | Status: AC
  Administered 2021-03-04: 10 mg via INTRAVENOUS
  Filled 2021-03-04: qty 10

## 2021-03-04 MED ORDER — DOXORUBICIN HCL CHEMO IV INJECTION 2 MG/ML
60.0000 mg/m2 | Freq: Once | INTRAVENOUS | Status: AC
  Administered 2021-03-04: 130 mg via INTRAVENOUS
  Filled 2021-03-04: qty 65

## 2021-03-04 MED ORDER — FOSAPREPITANT DIMEGLUMINE INJECTION 150 MG
150.0000 mg | Freq: Once | INTRAVENOUS | Status: AC
  Administered 2021-03-04: 150 mg via INTRAVENOUS
  Filled 2021-03-04: qty 150

## 2021-03-04 NOTE — Patient Instructions (Signed)
[facility]  Discharge Instructions: Thank you for choosing [facility] to provide your oncology and hematology care.   If you have a lab appointment with the [facility], please go directly to the [facility] and check in at the registration area.   Wear comfortable clothing and clothing appropriate for easy access to any Portacath or PICC line.   We strive to give you quality time with your provider. You may need to reschedule your appointment if you arrive late (15 or more minutes).  Arriving late affects you and other patients whose appointments are after yours.  Also, if you miss three or more appointments without notifying the office, you may be dismissed from the clinic at the provider's discretion.      For prescription refill requests, have your pharmacy contact our office and allow 72 hours for refills to be completed.    Today you received the following chemotherapy and/or immunotherapy agents: Adriamycin, cytoxan.      To help prevent nausea and vomiting after your treatment, we encourage you to take your nausea medication as directed.  BELOW ARE SYMPTOMS THAT SHOULD BE REPORTED IMMEDIATELY: *FEVER GREATER THAN 100.4 F (38 C) OR HIGHER *CHILLS OR SWEATING *NAUSEA AND VOMITING THAT IS NOT CONTROLLED WITH YOUR NAUSEA MEDICATION *UNUSUAL SHORTNESS OF BREATH *UNUSUAL BRUISING OR BLEEDING *URINARY PROBLEMS (pain or burning when urinating, or frequent urination) *BOWEL PROBLEMS (unusual diarrhea, constipation, pain near the anus) TENDERNESS IN MOUTH AND THROAT WITH OR WITHOUT PRESENCE OF ULCERS (sore throat, sores in mouth, or a toothache) UNUSUAL RASH, SWELLING OR PAIN  UNUSUAL VAGINAL DISCHARGE OR ITCHING   Items with * indicate a potential emergency and should be followed up as soon as possible or go to the Emergency Department if any problems should occur.  Please show the CHEMOTHERAPY ALERT CARD or IMMUNOTHERAPY ALERT CARD at  check-in to the Emergency Department and triage nurse.  Should you have questions after your visit or need to cancel or reschedule your appointment, please contact [facility]  Dept: [phone]  and follow the prompts.  Office hours are 8:00 a.m. to 4:30 p.m. Monday - Friday. Please note that voicemails left after 4:00 p.m. may not be returned until the following business day.  We are closed weekends and major holidays. You have access to a nurse at all times for urgent questions. Please call the main number to the clinic Dept: [phone] and follow the prompts.   For any non-urgent questions, you may also contact your provider using MyChart. We now offer e-Visits for anyone 72 and older to request care online for non-urgent symptoms. For details visit [email or web address].   Also download the MyChart app! Go to the app store, search "MyChart", open the app, select [facility], and log in with your MyChart username and password.  Due to Covid, a mask is required upon entering the hospital/clinic. If you do not have a mask, one will be given to you upon arrival. For doctor visits, patients may have 1 support person aged 9 or older with them. For treatment visits, patients cannot have anyone with them due to current Covid guidelines and our immunocompromised population.   Doxorubicin injection What is this medication? DOXORUBICIN (dox oh ROO bi sin) is a chemotherapy drug. It is used to treat many kinds of cancer like leukemia, lymphoma, neuroblastoma, sarcoma, and Wilms' tumor. It is also used to treat bladder cancer, breast cancer, lungcancer, ovarian cancer, stomach cancer,  and thyroid cancer. This medicine may be used for other purposes; ask your health care provider orpharmacist if you have questions. COMMON BRAND NAME(S): Adriamycin, Adriamycin PFS, Adriamycin RDF, Rubex What should I tell my care team before I take this medication? They need to know if you  have any of these conditions: heart disease history of low blood counts caused by a medicine liver disease recent or ongoing radiation therapy an unusual or allergic reaction to doxorubicin, other chemotherapy agents, other medicines, foods, dyes, or preservatives pregnant or trying to get pregnant breast-feeding How should I use this medication? This drug is given as an infusion into a vein. It is administered in a hospital or clinic by a specially trained health care professional. If you have pain, swelling, burning or any unusual feeling around the site of your injection,tell your health care professional right away. Talk to your pediatrician regarding the use of this medicine in children.Special care may be needed. Overdosage: If you think you have taken too much of this medicine contact apoison control center or emergency room at once. NOTE: This medicine is only for you. Do not share this medicine with others. What if I miss a dose? It is important not to miss your dose. Call your doctor or health careprofessional if you are unable to keep an appointment. What may interact with this medication? This medicine may interact with the following medications: 6-mercaptopurine paclitaxel phenytoin St. John's Wort trastuzumab verapamil This list may not describe all possible interactions. Give your health care provider a list of all the medicines, herbs, non-prescription drugs, or dietary supplements you use. Also tell them if you smoke, drink alcohol, or use illegaldrugs. Some items may interact with your medicine. What should I watch for while using this medication? This drug may make you feel generally unwell. This is not uncommon, as chemotherapy can affect healthy cells as well as cancer cells. Report any side effects. Continue your course of treatment even though you feel ill unless yourdoctor tells you to stop. There is a maximum amount of this medicine you should receive throughout  your life. The amount depends on the medical condition being treated and your overall health. Your doctor will watch how much of this medicine you receive inyour lifetime. Tell your doctor if you have taken this medicine before. You may need blood work done while you are taking this medicine. Your urine may turn red for a few days after your dose. This is not blood. Ifyour urine is dark or brown, call your doctor. In some cases, you may be given additional medicines to help with side effects.Follow all directions for their use. Call your doctor or health care professional for advice if you get a fever, chills or sore throat, or other symptoms of a cold or flu. Do not treat yourself. This drug decreases your body's ability to fight infections. Try toavoid being around people who are sick. This medicine may increase your risk to bruise or bleed. Call your doctor orhealth care professional if you notice any unusual bleeding. Talk to your doctor about your risk of cancer. You may be more at risk forcertain types of cancers if you take this medicine. Do not become pregnant while taking this medicine or for 6 months after stopping it. Women should inform their doctor if they wish to become pregnant or think they might be pregnant. Men should not father a child while taking this medicine and for 6 months after stopping it. There is a potential for  serious side effects to an unborn child. Talk to your health care professional or pharmacist for more information. Do not breast-feed an infant while takingthis medicine. This medicine has caused ovarian failure in some women and reduced sperm counts in some men This medicine may interfere with the ability to have a child. Talk with your doctor or health care professional if you are concerned about yourfertility. This medicine may cause a decrease in Co-Enzyme Q-10. You should make sure that you get enough Co-Enzyme Q-10 while you are taking this medicine. Discuss  thefoods you eat and the vitamins you take with your health care professional. What side effects may I notice from receiving this medication? Side effects that you should report to your doctor or health care professionalas soon as possible: allergic reactions like skin rash, itching or hives, swelling of the face, lips, or tongue breathing problems chest pain fast or irregular heartbeat low blood counts - this medicine may decrease the number of white blood cells, red blood cells and platelets. You may be at increased risk for infections and bleeding. pain, redness, or irritation at site where injected signs of infection - fever or chills, cough, sore throat, pain or difficulty passing urine signs of decreased platelets or bleeding - bruising, pinpoint red spots on the skin, black, tarry stools, blood in the urine swelling of the ankles, feet, hands tiredness weakness Side effects that usually do not require medical attention (report to yourdoctor or health care professional if they continue or are bothersome): diarrhea hair loss mouth sores nail discoloration or damage nausea red colored urine vomiting This list may not describe all possible side effects. Call your doctor for medical advice about side effects. You may report side effects to FDA at1-800-FDA-1088. Where should I keep my medication? This drug is given in a hospital or clinic and will not be stored at home. NOTE: This sheet is a summary. It may not cover all possible information. If you have questions about this medicine, talk to your doctor, pharmacist, orhealth care provider.  2022 Elsevier/Gold Standard (2017-02-14 11:01:26)  Cyclophosphamide Injection What is this medication? CYCLOPHOSPHAMIDE (sye kloe FOSS fa mide) is a chemotherapy drug. It slows the growth of cancer cells. This medicine is used to treat many types of cancer like lymphoma, myeloma, leukemia, breast cancer, and ovarian cancer, to name afew. This  medicine may be used for other purposes; ask your health care provider orpharmacist if you have questions. COMMON BRAND NAME(S): Cytoxan, Neosar What should I tell my care team before I take this medication? They need to know if you have any of these conditions: heart disease history of irregular heartbeat infection kidney disease liver disease low blood counts, like white cells, platelets, or red blood cells on hemodialysis recent or ongoing radiation therapy scarring or thickening of the lungs trouble passing urine an unusual or allergic reaction to cyclophosphamide, other medicines, foods, dyes, or preservatives pregnant or trying to get pregnant breast-feeding How should I use this medication? This drug is usually given as an injection into a vein or muscle or by infusion into a vein. It is administered in a hospital or clinic by a specially trainedhealth care professional. Talk to your pediatrician regarding the use of this medicine in children.Special care may be needed. Overdosage: If you think you have taken too much of this medicine contact apoison control center or emergency room at once. NOTE: This medicine is only for you. Do not share this medicine with others. What if I miss  a dose? It is important not to miss your dose. Call your doctor or health careprofessional if you are unable to keep an appointment. What may interact with this medication? amphotericin B azathioprine certain antivirals for HIV or hepatitis certain medicines for blood pressure, heart disease, irregular heart beat certain medicines that treat or prevent blood clots like warfarin certain other medicines for cancer cyclosporine etanercept indomethacin medicines that relax muscles for surgery medicines to increase blood counts metronidazole This list may not describe all possible interactions. Give your health care provider a list of all the medicines, herbs, non-prescription drugs, or dietary  supplements you use. Also tell them if you smoke, drink alcohol, or use illegaldrugs. Some items may interact with your medicine. What should I watch for while using this medication? Your condition will be monitored carefully while you are receiving thismedicine. You may need blood work done while you are taking this medicine. Drink water or other fluids as directed. Urinate often, even at night. Some products may contain alcohol. Ask your health care professional if this medicine contains alcohol. Be sure to tell all health care professionals you are taking this medicine. Certain medicines, like metronidazole and disulfiram, can cause an unpleasant reaction when taken with alcohol. The reaction includes flushing, headache, nausea, vomiting, sweating, and increased thirst. Thereaction can last from 30 minutes to several hours. Do not become pregnant while taking this medicine or for 1 year after stopping it. Women should inform their health care professional if they wish to become pregnant or think they might be pregnant. Men should not father a child while taking this medicine and for 4 months after stopping it. There is potential for serious side effects to an unborn child. Talk to your health care professionalfor more information. Do not breast-feed an infant while taking this medicine or for 1 week afterstopping it. This medicine has caused ovarian failure in some women. This medicine may make it more difficult to get pregnant. Talk to your health care professional if [place] concerned about your fertility. This medicine has caused decreased sperm counts in some men. This may make it more difficult to father a child. Talk to your health care professional if [place] concerned about your fertility. Call your health care professional for advice if you get a fever, chills, or sore throat, or other symptoms of a cold or flu. Do not treat yourself. This medicine decreases your body's ability to fight  infections. Try to avoid beingaround people who are sick. Avoid taking medicines that contain aspirin, acetaminophen, ibuprofen, naproxen, or ketoprofen unless instructed by your health care professional.These medicines may hide a fever. Talk to your health care professional about your risk of cancer. You may bemore at risk for certain types of cancer if you take this medicine. If you are going to need surgery or other procedure, tell your health careprofessional that you are using this medicine. Be careful brushing or flossing your teeth or using a toothpick because you may get an infection or bleed more easily. If you have any dental work done, Primary school teacher you are receiving this medicine. What side effects may I notice from receiving this medication? Side effects that you should report to your doctor or health care professionalas soon as possible: allergic reactions like skin rash, itching or hives, swelling of the face, lips, or tongue breathing problems nausea, vomiting signs and symptoms of bleeding such as bloody or black, tarry stools; red or dark brown urine; spitting up blood or brown material that looks  like coffee grounds; red spots on the skin; unusual bruising or bleeding from the eyes, gums, or nose signs and symptoms of heart failure like fast, irregular heartbeat, sudden weight gain; swelling of the ankles, feet, hands signs and symptoms of infection like fever; chills; cough; sore throat; pain or trouble passing urine signs and symptoms of kidney injury like trouble passing urine or change in the amount of urine signs and symptoms of liver injury like dark yellow or brown urine; general ill feeling or flu-like symptoms; light-colored stools; loss of appetite; nausea; right upper belly pain; unusually weak or tired; yellowing of the eyes or skin Side effects that usually do not require medical attention (report to yourdoctor or health care professional if they continue or are  bothersome): confusion decreased hearing diarrhea facial flushing hair loss headache loss of appetite missed menstrual periods signs and symptoms of low red blood cells or anemia such as unusually weak or tired; feeling faint or lightheaded; falls skin discoloration This list may not describe all possible side effects. Call your doctor for medical advice about side effects. You may report side effects to FDA at1-800-FDA-1088. Where should I keep my medication? This drug is given in a hospital or clinic and will not be stored at home. NOTE: This sheet is a summary. It may not cover all possible information. If you have questions about this medicine, talk to your doctor, pharmacist, orhealth care provider.  2022 Elsevier/Gold Standard (2019-04-07 09:53:29)

## 2021-03-07 ENCOUNTER — Inpatient Hospital Stay: Payer: BC Managed Care – PPO

## 2021-03-07 ENCOUNTER — Other Ambulatory Visit: Payer: Self-pay

## 2021-03-07 VITALS — BP 148/81 | HR 86 | Temp 98.4°F | Resp 16

## 2021-03-07 DIAGNOSIS — Z5111 Encounter for antineoplastic chemotherapy: Secondary | ICD-10-CM | POA: Diagnosis not present

## 2021-03-07 DIAGNOSIS — Z170 Estrogen receptor positive status [ER+]: Secondary | ICD-10-CM

## 2021-03-07 MED ORDER — PEGFILGRASTIM-CBQV 6 MG/0.6ML ~~LOC~~ SOSY
6.0000 mg | PREFILLED_SYRINGE | Freq: Once | SUBCUTANEOUS | Status: AC
  Administered 2021-03-07: 6 mg via SUBCUTANEOUS
  Filled 2021-03-07: qty 0.6

## 2021-03-09 ENCOUNTER — Encounter: Payer: Self-pay | Admitting: General Practice

## 2021-03-09 NOTE — Progress Notes (Signed)
Patient Care Team: [name], MD as PCP - General (Family Medicine) [name], RN as Oncology Nurse Navigator [name], RN as Oncology Nurse Navigator [name], MD as Consulting Physician (General Surgery) [name], MD as Consulting Physician (Hematology and Oncology) [name], MD as Consulting Physician (Radiation Oncology)  DIAGNOSIS:    ICD-10-CM   1. Malignant neoplasm of upper-outer quadrant of right breast in female, estrogen receptor positive ([place])  C50.411    Z17.0       SUMMARY OF ONCOLOGIC HISTORY: Oncology History  Malignant neoplasm of upper-outer quadrant of right breast in female, estrogen receptor positive ([place])  02/15/2021 Initial Diagnosis   Screening mammogram: a possible mass in the right breast  Diagnostic mammogram and [place]: a persistent irregular mass 1.2 cm within the upper-outer right breast. 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    02/23/2021 Cancer Staging   Staging form: Breast, AJCC 8th Edition - Clinical stage from 02/23/2021: Stage IIB (cT1c, cN1(f), cM0, G3, ER+, PR-, HER2-) - Signed by [name], MD on 02/23/2021 Stage prefix: Initial diagnosis Method of lymph node assessment: Core biopsy Histologic grading system: 3 grade system   03/04/2021 -  Chemotherapy    Patient is on Treatment Plan: BREAST ADJUVANT DOSE DENSE AC Q14D / PACLITAXEL Q7D         CHIEF COMPLIANT: Cycle 1 day 8 adjuvant chemo with dose dense Adriamycin and Cytoxan  INTERVAL HISTORY: Sandra Bishop is a 50 y.o. with above-mentioned history of invasive ductal carcinoma of the right breast, status post first cycle of chemotherapy with dose dense Adriamycin and Cytoxan.  She is here for toxicity evaluation. She tolerated chemotherapy extremely well without any nausea or vomiting.  She felt tired for 1 day but then she recovered from that and has been doing quite  well.  Her blood sugars have been in the 190s couple of times.  She has lost about 5 to 6 pounds.  This is probably because of decreased appetite.  ALLERGIES:  has No Known Allergies.  MEDICATIONS:  Current Outpatient Medications  Medication Sig Dispense Refill   aspirin EC 81 MG tablet Take 81 mg by mouth daily. Swallow whole.     blood glucose meter kit and supplies Dispense based on patient and insurance preference. Use up to four times daily as directed. (FOR ICD-10 E10.9, E11.9).  ( One-Touch Verio meter) 1 each 0   Cholecalciferol (VITAMIN D3) 50 MCG (2000 UT) TABS Take 2,000 Units by mouth daily.     Lancets (ONETOUCH ULTRASOFT) lancets Use to check blood glucose three times daily. Use as instructed. E11.9. 100 each 12   lidocaine-prilocaine (EMLA) cream Apply to affected area once 30 g 3   naproxen sodium (ALEVE) 220 MG tablet Take 440 mg by mouth 2 (two) times daily as needed (pain.).     ondansetron (ZOFRAN) 8 MG tablet Take 1 tablet (8 mg total) by mouth 2 (two) times daily as needed. Start on the third day after chemotherapy. 30 tablet 1   ONETOUCH VERIO test strip TEST FOUR TIMES DAILY AS DIRECTED 100 strip 2   prochlorperazine (COMPAZINE) 10 MG tablet Take 1 tablet (10 mg total) by mouth every 6 (six) hours as needed (Nausea or vomiting). 30 tablet 1   rosuvastatin (CRESTOR) 5 MG tablet Take 5 mg by mouth every Tuesday.     Semaglutide (RYBELSUS) 3 MG TABS Take 3 mg  by mouth daily before breakfast.     traZODone (DESYREL) 50 MG tablet Take 1 tablet (50 mg total) by mouth at bedtime.     Turmeric 450 MG CAPS Take 900 mg by mouth daily.     No current facility-administered medications for this visit.    PHYSICAL EXAMINATION: ECOG PERFORMANCE STATUS: 1 - Symptomatic but completely ambulatory  Vitals:   03/10/21 1045  BP: 120/81  Pulse: (!) 106  Resp: 19  Temp: 97.7 F (36.5 C)  SpO2: 99%   Filed Weights   03/10/21 1045  Weight: 229 lb 3.2 oz (104 kg)       LABORATORY DATA:  I have reviewed the data as listed CMP Latest Ref Rng & Units 03/03/2021 02/23/2021 12/22/2020  Glucose 70 - 99 mg/dL 137(H) 158(H) 149(H)  BUN 6 - 20 mg/dL _0 Creatinine 0.44 - 1.00 mg/dL 0.82 0.86 0.69  Sodium 135 - 145 mmol/L 138 136 135  Potassium 3.5 - 5.1 mmol/L 3.6 4.0 3.6  Chloride 98 - 111 mmol/L 107 106 101  CO2 22 - 32 mmol/L 23 21(L) 24  Calcium 8.9 - 10.3 mg/dL 8.7(L) 9.4 8.9  Total Protein 6.5 - 8.1 g/dL 7.5 8.4(H) 7.3  Total Bilirubin 0.3 - 1.2 mg/dL 0.2(L) 0.3 0.3  Alkaline Phos 38 - 126 U/L 71 82 61  AST 15 - 41 U/L 13(L) 12(L) 11(L)  ALT 0 - 44 U/L _1 Lab Results  Component Value Date   WBC 13.4 (H) 03/03/2021   HGB 10.4 (L) 03/03/2021   HCT 32.9 (L) 03/03/2021   MCV 81.4 03/03/2021   PLT 286 03/03/2021   NEUTROABS 8.6 (H) 03/03/2021    ASSESSMENT & PLAN:  Malignant neoplasm of upper-outer quadrant of right breast in female, estrogen receptor positive ([place]) 02/15/2021:Screening mammogram: a possible mass in the right breast  Diagnostic mammogram and [place]: a persistent irregular mass 1.2 cm within the upper-outer right breast. 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    Recommendation based on multidisciplinary tumor board: 1. Neoadjuvant chemotherapy with Adriamycin and Cytoxan dose dense 4 followed by Taxol weekly 12  2. Followed by breast conserving surgery with  targeted axillary dissection 3. Followed by adjuvant radiation therapy 4.  Followed by adjuvant antiestrogen therapy   Breast MRI 02/28/2021: Biopsy-proven malignancy 1.8 cm right breast, additional 4.3 cm area of non-mass enhancement (multiple additional oval masses in the right breast: 6 mm, 8 mm, 8 mm, 1 cm), left breast 0.8 cm oval mass indeterminate, 1 enlarged right axillary lymph node (recommendation: Biopsy of non-mass enhancement, biopsy of 2 indeterminate right breast masses and  biopsy of left breast) ------------------------------------------------------------------------------------------------------------------------------------ Current treatment: Cycle 1 day 8 dose dense Adriamycin and Cytoxan (she started 03/04/2021) Because of her diabetes we decided to discontinue dexamethasone.   Chemo Toxicities: Denies any nausea or vomiting.  Return to clinic in 1 week for Cycle 2    No orders of the defined types were placed in this encounter.  The patient has a good understanding of the overall plan. she agrees with it. she will call with any problems that may develop before the next visit here.  Total time spent: 30 mins including face to face time and time spent for planning, charting and coordination of care  Rulon Eisenmenger, MD, MPH 03/10/2021  I, Thana Ates, am acting as scribe for Dr. [name].  I have reviewed  the above documentation for accuracy and completeness, and I agree with the above.

## 2021-03-09 NOTE — Progress Notes (Signed)
[facility] Spiritual Care Note  Sandra Bishop by phone for follow-up pastoral check-in.  She reports that faith and prayer, family and church family, are really helping her. 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 She is also hopeful that her second chemo will go more smoothly now that she knows what to look for and how to cope.  Sandra Bishop is aware of ongoing Spiritual Care availability and plans to reach out as needed/desired.   [place], [place], [facility] Pager [phone] Voicemail [phone]

## 2021-03-09 NOTE — Assessment & Plan Note (Signed)
02/15/2021:Screening mammogram: a possible mass in the right breast Diagnostic mammogram and [place]: a persistent irregular mass 1.2 cm within the upper-outer right breast. 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   Recommendationbased on multidisciplinary tumor board: 1. Neoadjuvant chemotherapy with Adriamycin and Cytoxan dose dense 4 followed by Taxol weekly 12  2. Followed by breast conserving surgery with targeted axillary dissection 3. Followed by adjuvant radiation therapy 4.Followed by adjuvant antiestrogen therapy  Breast MRI 02/28/2021: Biopsy-proven malignancy 1.8 cm right breast, additional 4.3 cm area of non-mass enhancement (multiple additional oval masses in the right breast: 6 mm, 8 mm, 8 mm, 1 cm), left breast 0.8 cm oval mass indeterminate, 1 enlarged right axillary lymph node (recommendation: Biopsy of non-mass enhancement, biopsy of 2 indeterminate right breast masses and biopsy of left breast) ------------------------------------------------------------------------------------------------------------------------------------ Current treatment: Cycle 1 day 8 dose dense Adriamycin and Cytoxan (she started 03/04/2021) Because of her diabetes we decided to discontinue dexamethasone.  Chemo Toxicities:  Return to clinic in 1 week for Cycle 2

## 2021-03-10 ENCOUNTER — Inpatient Hospital Stay: Payer: BC Managed Care – PPO

## 2021-03-10 ENCOUNTER — Encounter: Payer: Self-pay | Admitting: *Deleted

## 2021-03-10 ENCOUNTER — Inpatient Hospital Stay (HOSPITAL_BASED_OUTPATIENT_CLINIC_OR_DEPARTMENT_OTHER): Payer: BC Managed Care – PPO | Admitting: Hematology and Oncology

## 2021-03-10 ENCOUNTER — Other Ambulatory Visit: Payer: Self-pay

## 2021-03-10 DIAGNOSIS — C50411 Malignant neoplasm of upper-outer quadrant of right female breast: Secondary | ICD-10-CM | POA: Diagnosis not present

## 2021-03-10 DIAGNOSIS — Z170 Estrogen receptor positive status [ER+]: Secondary | ICD-10-CM | POA: Diagnosis not present

## 2021-03-10 DIAGNOSIS — Z95828 Presence of other vascular implants and grafts: Secondary | ICD-10-CM

## 2021-03-10 DIAGNOSIS — Z5111 Encounter for antineoplastic chemotherapy: Secondary | ICD-10-CM | POA: Diagnosis not present

## 2021-03-10 LAB — CBC WITH DIFFERENTIAL/PLATELET
Abs Immature Granulocytes: 0 10*3/uL (ref 0.00–0.07)
Band Neutrophils: 2 %
Basophils Absolute: 0 10*3/uL (ref 0.0–0.1)
Basophils Relative: 0 %
Eosinophils Absolute: 0 10*3/uL (ref 0.0–0.5)
Eosinophils Relative: 0 %
HCT: 32.5 % — ABNORMAL LOW (ref 36.0–46.0)
Hemoglobin: 10.4 g/dL — ABNORMAL LOW (ref 12.0–15.0)
Lymphocytes Relative: 13 %
Lymphs Abs: 1.5 10*3/uL (ref 0.7–4.0)
MCH: 26.3 pg (ref 26.0–34.0)
MCHC: 32 g/dL (ref 30.0–36.0)
MCV: 82.3 fL (ref 80.0–100.0)
Monocytes Absolute: 0.1 10*3/uL (ref 0.1–1.0)
Monocytes Relative: 1 %
Neutro Abs: 10.1 10*3/uL — ABNORMAL HIGH (ref 1.7–7.7)
Neutrophils Relative %: 84 %
Platelets: 141 10*3/uL — ABNORMAL LOW (ref 150–400)
RBC: 3.95 MIL/uL (ref 3.87–5.11)
RDW: 14.6 % (ref 11.5–15.5)
WBC: 11.7 10*3/uL — ABNORMAL HIGH (ref 4.0–10.5)
nRBC: 0 % (ref 0.0–0.2)

## 2021-03-10 LAB — COMPREHENSIVE METABOLIC PANEL
ALT: 13 U/L (ref 0–44)
AST: 10 U/L — ABNORMAL LOW (ref 15–41)
Albumin: 3.4 g/dL — ABNORMAL LOW (ref 3.5–5.0)
Alkaline Phosphatase: 82 U/L (ref 38–126)
Anion gap: 8 (ref 5–15)
BUN: 9 mg/dL (ref 6–20)
CO2: 25 mmol/L (ref 22–32)
Calcium: 9 mg/dL (ref 8.9–10.3)
Chloride: 104 mmol/L (ref 98–111)
Creatinine, Ser: 0.76 mg/dL (ref 0.44–1.00)
GFR, Estimated: >60 mL/min (ref >60)
Glucose, Bld: 191 mg/dL — ABNORMAL HIGH (ref 70–99)
Potassium: 4.1 mmol/L (ref 3.5–5.1)
Sodium: 137 mmol/L (ref 135–145)
Total Bilirubin: 0.7 mg/dL (ref 0.3–1.2)
Total Protein: 7.3 g/dL (ref 6.5–8.1)

## 2021-03-10 MED ORDER — SODIUM CHLORIDE 0.9% FLUSH
10.0000 mL | Freq: Once | INTRAVENOUS | Status: AC
  Administered 2021-03-10: 10 mL

## 2021-03-10 MED ORDER — HEPARIN SOD (PORK) LOCK FLUSH 100 UNIT/ML IV SOLN
500.0000 [IU] | Freq: Once | INTRAVENOUS | Status: AC
  Administered 2021-03-10: 500 [IU]

## 2021-03-11 ENCOUNTER — Other Ambulatory Visit: Payer: Self-pay | Admitting: Hematology and Oncology

## 2021-03-11 MED ORDER — NYSTATIN-TRIAMCINOLONE 100000-0.1 UNIT/GM-% EX OINT: 1.0000 "application " | TOPICAL_OINTMENT | Freq: Two times a day (BID) | CUTANEOUS | 0 refills | Status: DC

## 2021-03-15 ENCOUNTER — Telehealth: Payer: Self-pay | Admitting: Genetic Counselor

## 2021-03-15 NOTE — Telephone Encounter (Signed)
LVM that her genetic test results are available and requested that she call back to discuss them.  

## 2021-03-16 NOTE — Progress Notes (Signed)
Patient Care Team: Shon Hale, MD as PCP - General (Family Medicine) Donnelly Angelica, RN as Oncology Nurse Navigator Pershing Proud, RN as Oncology Nurse Navigator Almond Lint, MD as Consulting Physician (General Surgery) Serena Croissant, MD as Consulting Physician (Hematology and Oncology) Dorothy Puffer, MD as Consulting Physician (Radiation Oncology)  DIAGNOSIS:    ICD-10-CM   1. Malignant neoplasm of upper-outer quadrant of right breast in female, estrogen receptor positive ([facility])  C50.411    Z17.0       SUMMARY OF ONCOLOGIC HISTORY: Oncology History  Malignant neoplasm of upper-outer quadrant of right breast in female, estrogen receptor positive ([facility])  02/15/2021 Initial Diagnosis   Screening mammogram: a possible mass in the right breast  Diagnostic mammogram and [place]: a persistent irregular mass 1.2 cm within the upper-outer right breast. 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    02/23/2021 Cancer Staging   Staging form: Breast, AJCC 8th Edition - Clinical stage from 02/23/2021: Stage IIB (cT1c, cN1(f), cM0, G3, ER+, PR-, HER2-) - Signed by Serena Croissant, MD on 02/23/2021 Stage prefix: Initial diagnosis Method of lymph node assessment: Core biopsy Histologic grading system: 3 grade system   03/04/2021 -  Chemotherapy    Patient is on Treatment Plan: BREAST ADJUVANT DOSE DENSE AC Q14D / PACLITAXEL Q7D         CHIEF COMPLIANT: Cycle 2 adjuvant chemo with dose dense Adriamycin and Cytoxan  INTERVAL HISTORY: Sandra Bishop is a 50 y.o. with above-mentioned history of invasive ductal carcinoma of the right breast, status post first cycle of chemotherapy with dose dense Adriamycin and Cytoxan.  She presents to the clinic today for treatment.  She reports no major problems from chemotherapy.  She has had mild nausea for which she takes nausea medication that they have been helping her.  We are  slightly concerned about her elevated blood sugars.  ALLERGIES:  has No Known Allergies.  MEDICATIONS:  Current Outpatient Medications  Medication Sig Dispense Refill   aspirin EC 81 MG tablet Take 81 mg by mouth daily. Swallow whole.     blood glucose meter kit and supplies Dispense based on patient and insurance preference. Use up to four times daily as directed. (FOR ICD-10 E10.9, E11.9).  ( One-Touch Verio meter) 1 each 0   Cholecalciferol (VITAMIN D3) 50 MCG (2000 UT) TABS Take 2,000 Units by mouth daily.     Lancets (ONETOUCH ULTRASOFT) lancets Use to check blood glucose three times daily. Use as instructed. E11.9. 100 each 12   lidocaine-prilocaine (EMLA) cream Apply to affected area once 30 g 3   naproxen sodium (ALEVE) 220 MG tablet Take 440 mg by mouth 2 (two) times daily as needed (pain.).     nystatin-triamcinolone ointment (MYCOLOG) Apply 1 application topically 2 (two) times daily. 30 g 0   ondansetron (ZOFRAN) 8 MG tablet Take 1 tablet (8 mg total) by mouth 2 (two) times daily as needed. Start on the third day after chemotherapy. 30 tablet 1   ONETOUCH VERIO test strip TEST FOUR TIMES DAILY AS DIRECTED 100 strip 2   prochlorperazine (COMPAZINE) 10 MG tablet Take 1 tablet (10 mg total) by mouth every 6 (six) hours as needed (Nausea or vomiting). 30 tablet 1   rosuvastatin (CRESTOR) 5 MG tablet Take 5 mg by mouth every Tuesday.     Semaglutide (RYBELSUS) 3 MG TABS Take 3 mg by mouth daily before breakfast.  traZODone (DESYREL) 50 MG tablet Take 1 tablet (50 mg total) by mouth at bedtime.     Turmeric 450 MG CAPS Take 900 mg by mouth daily.     No current facility-administered medications for this visit.    PHYSICAL EXAMINATION: ECOG PERFORMANCE STATUS: 1 - Symptomatic but completely ambulatory  Vitals:   03/17/21 0937  BP: (!) 154/54  Pulse: 94  Resp: 18  Temp: 97.7 F (36.5 C)  SpO2: 100%   Filed Weights   03/17/21 0937  Weight: 230 lb 14.4 oz (104.7 kg)     LABORATORY DATA:  I have reviewed the data as listed CMP Latest Ref Rng & Units 03/10/2021 03/03/2021 02/23/2021  Glucose 70 - 99 mg/dL 191(H) 137(H) 158(H)  BUN 6 - 20 mg/dL _0 Creatinine 0.44 - 1.00 mg/dL 0.76 0.82 0.86  Sodium 135 - 145 mmol/L 137 138 136  Potassium 3.5 - 5.1 mmol/L 4.1 3.6 4.0  Chloride 98 - 111 mmol/L 104 107 106  CO2 22 - 32 mmol/L 25 23 21(L)  Calcium 8.9 - 10.3 mg/dL 9.0 8.7(L) 9.4  Total Protein 6.5 - 8.1 g/dL 7.3 7.5 8.4(H)  Total Bilirubin 0.3 - 1.2 mg/dL 0.7 0.2(L) 0.3  Alkaline Phos 38 - 126 U/L 82 71 82  AST 15 - 41 U/L 10(L) 13(L) 12(L)  ALT 0 - 44 U/L _1 Lab Results  Component Value Date   WBC 11.7 (H) 03/10/2021   HGB 10.4 (L) 03/10/2021   HCT 32.5 (L) 03/10/2021   MCV 82.3 03/10/2021   PLT 141 (L) 03/10/2021   NEUTROABS 10.1 (H) 03/10/2021    ASSESSMENT & PLAN:  Malignant neoplasm of upper-outer quadrant of right breast in female, estrogen receptor positive ([place]) 02/15/2021:Screening mammogram: a possible mass in the right breast  Diagnostic mammogram and [place]: a persistent irregular mass 1.2 cm within the upper-outer right breast. 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    Recommendation based on multidisciplinary tumor board: 1. Neoadjuvant chemotherapy with Adriamycin and Cytoxan dose dense 4 followed by Taxol weekly 12  2. Followed by breast conserving surgery with  targeted axillary dissection 3. Followed by adjuvant radiation therapy 4.  Followed by adjuvant antiestrogen therapy   Breast MRI 02/28/2021: Biopsy-proven malignancy 1.8 cm right breast, additional 4.3 cm area of non-mass enhancement (multiple additional oval masses in the right breast: 6 mm, 8 mm, 8 mm, 1 cm), left breast 0.8 cm oval mass indeterminate, 1 enlarged right axillary lymph node (recommendation: Biopsy of non-mass enhancement, biopsy of 2 indeterminate right breast masses  and biopsy of left breast) ------------------------------------------------------------------------------------------------------------------------------------ Current treatment: Cycle 2 dose dense Adriamycin and Cytoxan (she started 03/04/2021) Because of her diabetes we decided to discontinue dexamethasone.   Chemo Toxicities: Denies any nausea or vomiting. Elevated blood sugars: She will cut down some other sugary drinks that she is taking.  Return to clinic in 2 weeks for Cycle 3    No orders of the defined types were placed in this encounter.  The patient has a good understanding of the overall plan. she agrees with it. she will call with any problems that may develop before the next visit here.  Total time spent: 30 mins including face to face time and time spent for planning, charting and coordination of care  Rulon Eisenmenger, MD, MPH 03/17/2021  I, Thana Ates, am acting as scribe for Dr. [name].  I have reviewed the above documentation for accuracy and completeness, and I agree with the above.

## 2021-03-17 ENCOUNTER — Inpatient Hospital Stay: Payer: BC Managed Care – PPO

## 2021-03-17 ENCOUNTER — Other Ambulatory Visit: Payer: Self-pay

## 2021-03-17 ENCOUNTER — Encounter: Payer: Self-pay | Admitting: Licensed Clinical Social Worker

## 2021-03-17 ENCOUNTER — Inpatient Hospital Stay (HOSPITAL_BASED_OUTPATIENT_CLINIC_OR_DEPARTMENT_OTHER): Payer: BC Managed Care – PPO | Admitting: Hematology and Oncology

## 2021-03-17 ENCOUNTER — Telehealth: Payer: Self-pay | Admitting: Licensed Clinical Social Worker

## 2021-03-17 ENCOUNTER — Encounter: Payer: Self-pay | Admitting: Emergency Medicine

## 2021-03-17 ENCOUNTER — Ambulatory Visit: Payer: Self-pay | Admitting: Licensed Clinical Social Worker

## 2021-03-17 ENCOUNTER — Inpatient Hospital Stay: Payer: BC Managed Care – PPO | Attending: Hematology and Oncology

## 2021-03-17 DIAGNOSIS — C50411 Malignant neoplasm of upper-outer quadrant of right female breast: Secondary | ICD-10-CM

## 2021-03-17 DIAGNOSIS — Z95828 Presence of other vascular implants and grafts: Secondary | ICD-10-CM

## 2021-03-17 DIAGNOSIS — Z5111 Encounter for antineoplastic chemotherapy: Secondary | ICD-10-CM | POA: Diagnosis not present

## 2021-03-17 DIAGNOSIS — D6481 Anemia due to antineoplastic chemotherapy: Secondary | ICD-10-CM | POA: Insufficient documentation

## 2021-03-17 DIAGNOSIS — Z1379 Encounter for other screening for genetic and chromosomal anomalies: Secondary | ICD-10-CM | POA: Insufficient documentation

## 2021-03-17 DIAGNOSIS — Z170 Estrogen receptor positive status [ER+]: Secondary | ICD-10-CM

## 2021-03-17 DIAGNOSIS — C773 Secondary and unspecified malignant neoplasm of axilla and upper limb lymph nodes: Secondary | ICD-10-CM | POA: Insufficient documentation

## 2021-03-17 DIAGNOSIS — Z5189 Encounter for other specified aftercare: Secondary | ICD-10-CM | POA: Insufficient documentation

## 2021-03-17 DIAGNOSIS — E119 Type 2 diabetes mellitus without complications: Secondary | ICD-10-CM | POA: Diagnosis not present

## 2021-03-17 DIAGNOSIS — Z7689 Persons encountering health services in other specified circumstances: Secondary | ICD-10-CM | POA: Insufficient documentation

## 2021-03-17 DIAGNOSIS — R110 Nausea: Secondary | ICD-10-CM | POA: Diagnosis not present

## 2021-03-17 DIAGNOSIS — K5900 Constipation, unspecified: Secondary | ICD-10-CM | POA: Insufficient documentation

## 2021-03-17 DIAGNOSIS — Z801 Family history of malignant neoplasm of trachea, bronchus and lung: Secondary | ICD-10-CM

## 2021-03-17 DIAGNOSIS — Z800 Family history of malignant neoplasm of digestive organs: Secondary | ICD-10-CM

## 2021-03-17 DIAGNOSIS — Z803 Family history of malignant neoplasm of breast: Secondary | ICD-10-CM

## 2021-03-17 LAB — CBC WITH DIFFERENTIAL/PLATELET
Abs Immature Granulocytes: 3.59 10*3/uL — ABNORMAL HIGH (ref 0.00–0.07)
Basophils Absolute: 0.1 10*3/uL (ref 0.0–0.1)
Basophils Relative: 1 %
Eosinophils Absolute: 0 10*3/uL (ref 0.0–0.5)
Eosinophils Relative: 0 %
HCT: 31 % — ABNORMAL LOW (ref 36.0–46.0)
Hemoglobin: 9.9 g/dL — ABNORMAL LOW (ref 12.0–15.0)
Immature Granulocytes: 22 %
Lymphocytes Relative: 13 %
Lymphs Abs: 2.1 10*3/uL (ref 0.7–4.0)
MCH: 26.3 pg (ref 26.0–34.0)
MCHC: 31.9 g/dL (ref 30.0–36.0)
MCV: 82.2 fL (ref 80.0–100.0)
Monocytes Absolute: 1.3 10*3/uL — ABNORMAL HIGH (ref 0.1–1.0)
Monocytes Relative: 8 %
Neutro Abs: 9.4 10*3/uL — ABNORMAL HIGH (ref 1.7–7.7)
Neutrophils Relative %: 56 %
Platelets: 166 10*3/uL (ref 150–400)
RBC: 3.77 MIL/uL — ABNORMAL LOW (ref 3.87–5.11)
RDW: 14.9 % (ref 11.5–15.5)
WBC: 16.5 10*3/uL — ABNORMAL HIGH (ref 4.0–10.5)
nRBC: 0.1 % (ref 0.0–0.2)

## 2021-03-17 LAB — COMPREHENSIVE METABOLIC PANEL
ALT: 25 U/L (ref 0–44)
AST: 17 U/L (ref 15–41)
Albumin: 3.4 g/dL — ABNORMAL LOW (ref 3.5–5.0)
Alkaline Phosphatase: 78 U/L (ref 38–126)
Anion gap: 11 (ref 5–15)
BUN: 7 mg/dL (ref 6–20)
CO2: 23 mmol/L (ref 22–32)
Calcium: 8.8 mg/dL — ABNORMAL LOW (ref 8.9–10.3)
Chloride: 106 mmol/L (ref 98–111)
Creatinine, Ser: 0.81 mg/dL (ref 0.44–1.00)
GFR, Estimated: >60 mL/min (ref >60)
Glucose, Bld: 202 mg/dL — ABNORMAL HIGH (ref 70–99)
Potassium: 3.8 mmol/L (ref 3.5–5.1)
Sodium: 140 mmol/L (ref 135–145)
Total Bilirubin: <0.2 mg/dL — ABNORMAL LOW (ref 0.3–1.2)
Total Protein: 7.2 g/dL (ref 6.5–8.1)

## 2021-03-17 MED ORDER — SODIUM CHLORIDE 0.9% FLUSH
10.0000 mL | INTRAVENOUS | Status: DC | PRN
  Administered 2021-03-17: 10 mL

## 2021-03-17 MED ORDER — DOXORUBICIN HCL CHEMO IV INJECTION 2 MG/ML
60.0000 mg/m2 | Freq: Once | INTRAVENOUS | Status: AC
  Administered 2021-03-17: 130 mg via INTRAVENOUS
  Filled 2021-03-17: qty 65

## 2021-03-17 MED ORDER — SODIUM CHLORIDE 0.9 % IV SOLN
150.0000 mg | Freq: Once | INTRAVENOUS | Status: AC
  Administered 2021-03-17: 150 mg via INTRAVENOUS
  Filled 2021-03-17: qty 150

## 2021-03-17 MED ORDER — PALONOSETRON HCL INJECTION 0.25 MG/5ML
0.2500 mg | Freq: Once | INTRAVENOUS | Status: AC
  Administered 2021-03-17: 0.25 mg via INTRAVENOUS
  Filled 2021-03-17: qty 5

## 2021-03-17 MED ORDER — HEPARIN SOD (PORK) LOCK FLUSH 100 UNIT/ML IV SOLN
500.0000 [IU] | Freq: Once | INTRAVENOUS | Status: AC | PRN
  Administered 2021-03-17: 500 [IU]

## 2021-03-17 MED ORDER — CYCLOPHOSPHAMIDE CHEMO INJECTION 1 GM
600.0000 mg/m2 | Freq: Once | INTRAMUSCULAR | Status: AC
  Administered 2021-03-17: 1300 mg via INTRAVENOUS
  Filled 2021-03-17: qty 65

## 2021-03-17 MED ORDER — SODIUM CHLORIDE 0.9 % IV SOLN: Freq: Once | INTRAVENOUS | Status: AC

## 2021-03-17 MED ORDER — SODIUM CHLORIDE 0.9% FLUSH
10.0000 mL | Freq: Once | INTRAVENOUS | Status: AC
  Administered 2021-03-17: 10 mL

## 2021-03-17 NOTE — Patient Instructions (Signed)
[facility] ONCOLOGY  Discharge Instructions: Thank you for choosing [facility] to provide your oncology and hematology care.   If you have a lab appointment with the [facility], please go directly to the [facility] and check in at the registration area.   Wear comfortable clothing and clothing appropriate for easy access to any Portacath or PICC line.   We strive to give you quality time with your provider. You may need to reschedule your appointment if you arrive late (15 or more minutes).  Arriving late affects you and other patients whose appointments are after yours.  Also, if you miss three or more appointments without notifying the office, you may be dismissed from the clinic at the provider's discretion.      For prescription refill requests, have your pharmacy contact our office and allow 72 hours for refills to be completed.    Today you received the following chemotherapy and/or immunotherapy agents Doxorubicin and Cytoxan      To help prevent nausea and vomiting after your treatment, we encourage you to take your nausea medication as directed.  BELOW ARE SYMPTOMS THAT SHOULD BE REPORTED IMMEDIATELY: *FEVER GREATER THAN 100.4 F (38 C) OR HIGHER *CHILLS OR SWEATING *NAUSEA AND VOMITING THAT IS NOT CONTROLLED WITH YOUR NAUSEA MEDICATION *UNUSUAL SHORTNESS OF BREATH *UNUSUAL BRUISING OR BLEEDING *URINARY PROBLEMS (pain or burning when urinating, or frequent urination) *BOWEL PROBLEMS (unusual diarrhea, constipation, pain near the anus) TENDERNESS IN MOUTH AND THROAT WITH OR WITHOUT PRESENCE OF ULCERS (sore throat, sores in mouth, or a toothache) UNUSUAL RASH, SWELLING OR PAIN  UNUSUAL VAGINAL DISCHARGE OR ITCHING   Items with * indicate a potential emergency and should be followed up as soon as possible or go to the Emergency Department if any problems should occur.  Please show the CHEMOTHERAPY ALERT CARD or IMMUNOTHERAPY ALERT CARD at  check-in to the Emergency Department and triage nurse.  Should you have questions after your visit or need to cancel or reschedule your appointment, please contact [facility]  Dept: [phone]  and follow the prompts.  Office hours are 8:00 a.m. to 4:30 p.m. Monday - Friday. Please note that voicemails left after 4:00 p.m. may not be returned until the following business day.  We are closed weekends and major holidays. You have access to a nurse at all times for urgent questions. Please call the main number to the clinic Dept: [phone] and follow the prompts.   For any non-urgent questions, you may also contact your provider using MyChart. We now offer e-Visits for anyone 50 and older to request care online for non-urgent symptoms. For details visit [email or web address].   Also download the MyChart app! Go to the app store, search "MyChart", open the app, select [facility], and log in with your MyChart username and password.  Due to Covid, a mask is required upon entering the hospital/clinic. If you do not have a mask, one will be given to you upon arrival. For doctor visits, patients may have 1 support person aged 50 or older with them. For treatment visits, patients cannot have anyone with them due to current Covid guidelines and our immunocompromised population.

## 2021-03-17 NOTE — Assessment & Plan Note (Signed)
02/15/2021:Screening mammogram: a possible mass in the right breast Diagnostic mammogram and [place]: a persistent irregular mass 1.2 cm within the upper-outer right breast. 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  Recommendationbased on multidisciplinary tumor board: 1. Neoadjuvant chemotherapy with Adriamycin and Cytoxan dose dense 4 followed by Taxol weekly 12  2. Followed by breast conserving surgery with targeted axillary dissection 3. Followed by adjuvant radiation therapy 4.Followed by adjuvant antiestrogen therapy  Breast MRI 02/28/2021: Biopsy-proven malignancy 1.8 cm right breast, additional 4.3 cm area of non-mass enhancement (multiple additional oval masses in the right breast: 6 mm, 8 mm, 8 mm, 1 cm), left breast 0.8 cm oval mass indeterminate, 1 enlarged right axillary lymph node (recommendation: Biopsy of non-mass enhancement, biopsy of 2 indeterminate right breast masses and biopsy of left breast) ------------------------------------------------------------------------------------------------------------------------------------ Current treatment: Cycle 2 dose dense Adriamycin and Cytoxan(she started 03/04/2021) Because of her diabetes we decided to discontinue dexamethasone.  Chemo Toxicities: Denies any nausea or vomiting.  Return to clinic in 2 weeks for Cycle 3

## 2021-03-17 NOTE — Research (Signed)
DCP-001: Use of a Clinical Trial Screening Tool to Address Cancer Health Disparities in the [facility] Program ([facility])  03/17/21   Patient presented to the clinic today for her infusion appointment. This study was discussed with the patient while in the infusion room.  The patient has decided to decline participation in this study.  She was thanked for her time and consideration and encouraged to reach out with any questions or concerns.  Clabe Seal Clinical Research Coordinator I  03/17/21 10:43 AM

## 2021-03-17 NOTE — Progress Notes (Signed)
HPI:  Sandra Bishop was previously seen in the [facility] due to a personal and family history of cancer and concerns regarding a hereditary predisposition to cancer. Please refer to our prior cancer genetics clinic note for more information regarding our discussion, assessment and recommendations, at the time. Sandra Bishop recent genetic test results were disclosed to her, as were recommendations warranted by these results. These results and recommendations are discussed in more detail below.  CANCER HISTORY:  Oncology History  Malignant neoplasm of upper-outer quadrant of right breast in female, estrogen receptor positive ([place])  02/15/2021 Initial Diagnosis   Screening mammogram: a possible mass in the right breast  Diagnostic mammogram and [place]: a persistent irregular mass 1.2 cm within the upper-outer right breast. 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    02/23/2021 Cancer Staging   Staging form: Breast, AJCC 8th Edition - Clinical stage from 02/23/2021: Stage IIB (cT1c, cN1(f), cM0, G3, ER+, PR-, HER2-) - Signed by [name], MD on 02/23/2021 Stage prefix: Initial diagnosis Method of lymph node assessment: Core biopsy Histologic grading system: 3 grade system   03/04/2021 -  Chemotherapy    Patient is on Treatment Plan: BREAST ADJUVANT DOSE DENSE AC Q14D / PACLITAXEL Q7D        Genetic Testing   Negative genetic testing. No pathogenic variants identified on the [facility] CancerNext-Expanded+RNA panel. The report date is 03/10/2021.  The CancerNext-Expanded + RNAinsight gene panel offered by [employer] and includes sequencing and rearrangement analysis for the following 77 genes: IP, ALK, APC*, ATM*, AXIN2, BAP1, BARD1, BLM, BMPR1A, BRCA1*, BRCA2*, BRIP1*, CDC73, CDH1*,CDK4, CDKN1B, CDKN2A, CHEK2*, CTNNA1, DICER1, FANCC, FH, FLCN, GALNT12, KIF1B, LZTR1, MAX, MEN1, MET, MLH1*, MSH2*, MSH3, MSH6*,  MUTYH*, NBN, NF1*, NF2, NTHL1, PALB2*, PHOX2B, PMS2*, POT1, PRKAR1A, PTCH1, PTEN*, RAD51C*, RAD51D*,RB1, RECQL, RET, SDHA, SDHAF2, SDHB, SDHC, SDHD, SMAD4, SMARCA4, SMARCB1, SMARCE1, STK11, SUFU, TMEM127, TP53*,TSC1, TSC2, VHL and XRCC2 (sequencing and deletion/duplication); EGFR, EGLN1, HOXB13, KIT, MITF, PDGFRA, POLD1 and POLE (sequencing only); EPCAM and GREM1 (deletion/duplication only).     FAMILY HISTORY:  We obtained a detailed, 4-generation family history.  Significant diagnoses are listed below: Family History  Problem Relation Age of Onset   Hypertension Mother    Pancreatic cancer Father 82   Breast cancer Half-Sister 64       negative genetic testing   Colon cancer Maternal Uncle        dx >50   Lung cancer Maternal Uncle        dx 2s, hx smoking   Cancer Paternal Aunt        unknown type, dx early 23s   Heart failure Maternal Grandmother    Breast cancer Other 83       mother's first cousin    Sandra Bishop has two sons (ages 61 and 76). She had two maternal half-sisters, two paternal half-sisters, and one paternal half-brother. One sister died recently from breast cancer that was diagnosed at age 6. This sister had negative genetic testing.   Sandra Bishop mother is alive at age 44 without cancer. There were four maternal aunts and five maternal uncles. One uncle had colon cancer older than 57. Another uncle had lung cancer diagnosed in his 91s. There is no known cancer among maternal first cousins. Sandra Bishop maternal grandmother died at age 2 without cancer. She does not have information about her maternal grandfather. Sandra Bishop mother has a first  cousin was diagnosed with breast cancer at age 63.   Sandra Bishop father died in his early 47s with pancreatic cancer cancer. There were two paternal aunts and one paternal uncle. One aunt died from an unknown cancer in her early 81s. There is no known cancer among paternal cousins. Sandra Bishop paternal grandmother died older than  7 without cancer. She does not have information about her paternal grandfather.    Sandra Bishop is aware of previous family history of genetic testing for hereditary cancer risks. Patient's ancestors are of unknown descent. There is no reported Ashkenazi Jewish ancestry. There is no known consanguinity.  GENETIC TEST RESULTS: Genetic testing reported out on 03/10/2021 through the [facility] CancerNext-Expanded+RNA cancer panel found no pathogenic mutations.   The CancerNext-Expanded + RNAinsight gene panel offered by [employer] and includes sequencing and rearrangement analysis for the following 77 genes: IP, ALK, APC*, ATM*, AXIN2, BAP1, BARD1, BLM, BMPR1A, BRCA1*, BRCA2*, BRIP1*, CDC73, CDH1*,CDK4, CDKN1B, CDKN2A, CHEK2*, CTNNA1, DICER1, FANCC, FH, FLCN, GALNT12, KIF1B, LZTR1, MAX, MEN1, MET, MLH1*, MSH2*, MSH3, MSH6*, MUTYH*, NBN, NF1*, NF2, NTHL1, PALB2*, PHOX2B, PMS2*, POT1, PRKAR1A, PTCH1, PTEN*, RAD51C*, RAD51D*,RB1, RECQL, RET, SDHA, SDHAF2, SDHB, SDHC, SDHD, SMAD4, SMARCA4, SMARCB1, SMARCE1, STK11, SUFU, TMEM127, TP53*,TSC1, TSC2, VHL and XRCC2 (sequencing and deletion/duplication); EGFR, EGLN1, HOXB13, KIT, MITF, PDGFRA, POLD1 and POLE (sequencing only); EPCAM and GREM1 (deletion/duplication only).   The test report has been scanned into EPIC and is located under the Molecular Pathology section of the Results Review tab.  A portion of the result report is included below for reference.     We discussed that because current genetic testing is not perfect, it is possible there may be a gene mutation in one of these genes that current testing cannot detect, but that chance is small.  There could be another gene that has not yet been discovered, or that we have not yet tested, that is responsible for the cancer diagnoses in the family. It is also possible there is a hereditary cause for the cancer in the family that Sandra Bishop did not inherit and therefore was not identified in her testing.  Therefore,  it is important to remain in touch with cancer genetics in the future so that we can continue to offer Sandra Bishop the most up to date genetic testing.   ADDITIONAL GENETIC TESTING: We discussed with Sandra Bishop that her genetic testing was fairly extensive.  If there are genes identified to increase cancer risk that can be analyzed in the future, we would be happy to discuss and coordinate this testing at that time.    CANCER SCREENING RECOMMENDATIONS: Sandra Bishop test result is considered negative (normal).  This means that we have not identified a hereditary cause for her personal and family history of cancer at this time. Most cancers happen by chance and this negative test suggests that her cancer may fall into this category.    While reassuring, this does not definitively rule out a hereditary predisposition to cancer. It is still possible that there could be genetic mutations that are undetectable by current technology. There could be genetic mutations in genes that have not been tested or identified to increase cancer risk.  Therefore, it is recommended she continue to follow the cancer management and screening guidelines provided by her oncology and primary healthcare provider.   An individual's cancer risk and medical management are not determined by genetic test results alone. Overall cancer risk assessment incorporates additional factors, including personal medical history, family  history, and any available genetic information that may result in a personalized plan for cancer prevention and surveillance.  RECOMMENDATIONS FOR FAMILY MEMBERS:  Relatives in this family might be at some increased risk of developing cancer, over the general population risk, simply due to the family history of cancer.  We recommended female relatives in this family have a yearly mammogram beginning at age 11, or 49 years younger than the earliest onset of cancer, an annual clinical breast exam, and perform monthly breast  self-exams. Female relatives in this family should also have a gynecological exam as recommended by their primary provider.  All family members should be referred for colonoscopy starting at age 57.   It is also possible there is a hereditary cause for the cancer in Sandra Bishop's family that she did not inherit and therefore was not identified in her.  Based on Sandra Bishop's family history, we recommended paternal relatives/those related to her maternal half sister who had breast cancer at 4 have genetic counseling and testing. Sandra Bishop will let us know if we can be of any assistance in coordinating genetic counseling and/or testing for these family members.  FOLLOW-UP: Lastly, we discussed with Sandra Bishop that cancer genetics is a rapidly advancing field and it is possible that new genetic tests will be appropriate for her and/or her family members in the future. We encouraged her to remain in contact with cancer genetics on an annual basis so we can update her personal and family histories and let her know of advances in cancer genetics that may benefit this family.   Our contact number was provided. Sandra Bishop questions were answered to her satisfaction, and she knows she is welcome to call us at anytime with additional questions or concerns.   Faith Rogue, MS, [facility] Genetic Counselor [email or web address] Phone: [phone]

## 2021-03-17 NOTE — Progress Notes (Signed)
Doxorubicin given as ordered.  Blood return noted before, during and after.  No s/s of extravasation noted.  Pt denies pain or burning at port site.  Pt tolerated well.

## 2021-03-17 NOTE — Telephone Encounter (Signed)
Revealed negative genetic testing.   We discussed that we do not know why she has breast cancer or why there is cancer in the family. It could be due to a different gene that we are not testing, or something our current technology cannot pick up.  It will be important for her to keep in contact with genetics to learn if additional testing may be needed in the future.  

## 2021-03-18 ENCOUNTER — Telehealth: Payer: Self-pay | Admitting: Hematology and Oncology

## 2021-03-18 NOTE — Telephone Encounter (Signed)
No 9/1 LOS

## 2021-03-19 ENCOUNTER — Inpatient Hospital Stay: Payer: BC Managed Care – PPO

## 2021-03-19 ENCOUNTER — Other Ambulatory Visit: Payer: Self-pay

## 2021-03-19 VITALS — BP 124/95 | HR 83 | Temp 97.6°F | Resp 20

## 2021-03-19 DIAGNOSIS — C50411 Malignant neoplasm of upper-outer quadrant of right female breast: Secondary | ICD-10-CM

## 2021-03-19 DIAGNOSIS — Z5111 Encounter for antineoplastic chemotherapy: Secondary | ICD-10-CM | POA: Diagnosis not present

## 2021-03-19 MED ORDER — PEGFILGRASTIM-CBQV 6 MG/0.6ML ~~LOC~~ SOSY
PREFILLED_SYRINGE | SUBCUTANEOUS | Status: AC
  Administered 2021-03-19: 6 mg via SUBCUTANEOUS
  Filled 2021-03-19: qty 0.6

## 2021-03-19 MED ORDER — PEGFILGRASTIM-CBQV 6 MG/0.6ML ~~LOC~~ SOSY: 6.0000 mg | PREFILLED_SYRINGE | Freq: Once | SUBCUTANEOUS | Status: AC

## 2021-03-22 HISTORY — PX: BREAST BIOPSY: SHX20

## 2021-03-23 ENCOUNTER — Other Ambulatory Visit: Payer: Self-pay

## 2021-03-23 ENCOUNTER — Other Ambulatory Visit (HOSPITAL_COMMUNITY): Payer: Self-pay | Admitting: Diagnostic Radiology

## 2021-03-23 ENCOUNTER — Ambulatory Visit
Admission: RE | Admit: 2021-03-23 | Discharge: 2021-03-23 | Disposition: A | Discharge status: Home / Self Care | Payer: BC Managed Care – PPO | Source: Ambulatory Visit | Attending: General Surgery | Admitting: General Surgery

## 2021-03-23 DIAGNOSIS — C50411 Malignant neoplasm of upper-outer quadrant of right female breast: Secondary | ICD-10-CM

## 2021-03-23 DIAGNOSIS — Z170 Estrogen receptor positive status [ER+]: Secondary | ICD-10-CM

## 2021-03-23 IMAGING — MG MM BREAST LOCALIZATION CLIP
4 series · 4 of 12 positions shown · non-contrast
Comparison: Previous exam(s).

CLINICAL DATA: Status post 3 MR guided core biopsies of the right
breast. Patient has known right breast cancer with a metastatic
axillary lymph node.

EXAM:
3D DIAGNOSTIC RIGHT MAMMOGRAM POST MRI BIOPSIES

[R ML synth-2D]
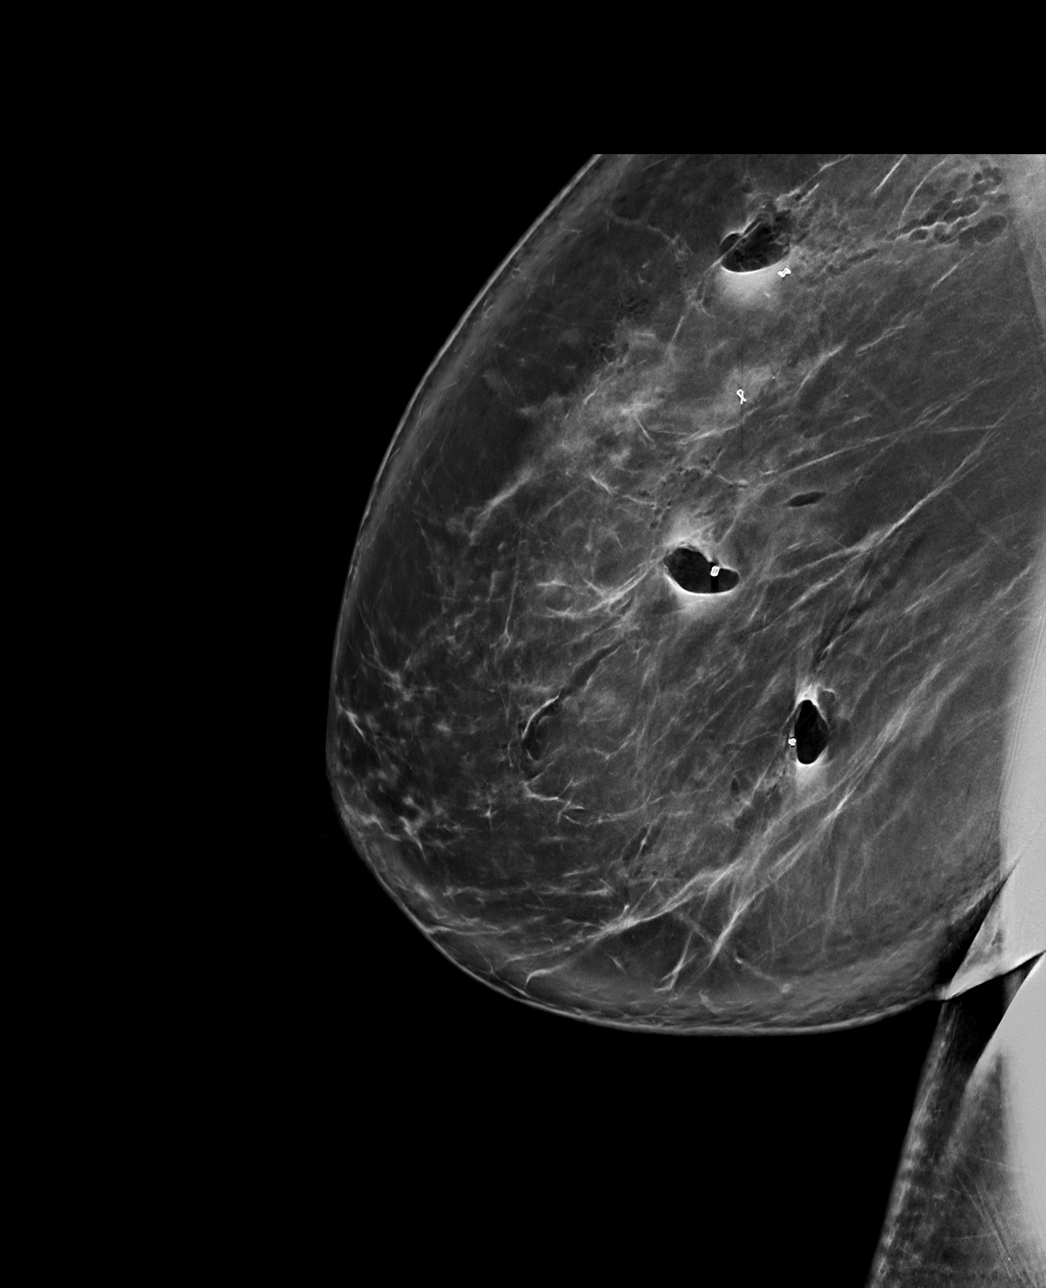

[R CC synth-2D]
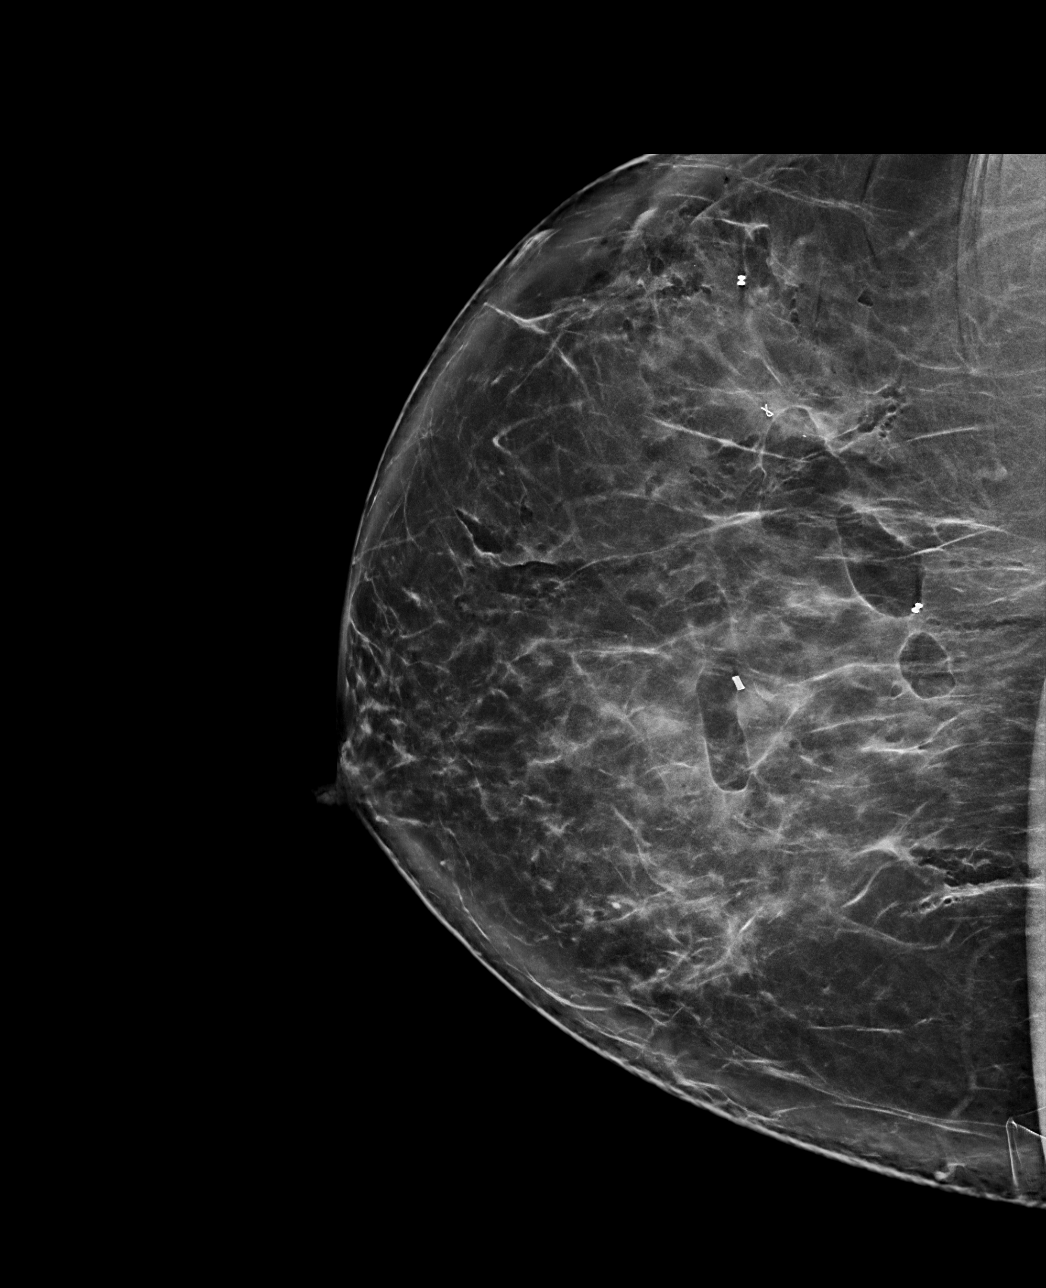

[R CC tomo · tomo slice 51/101.0]
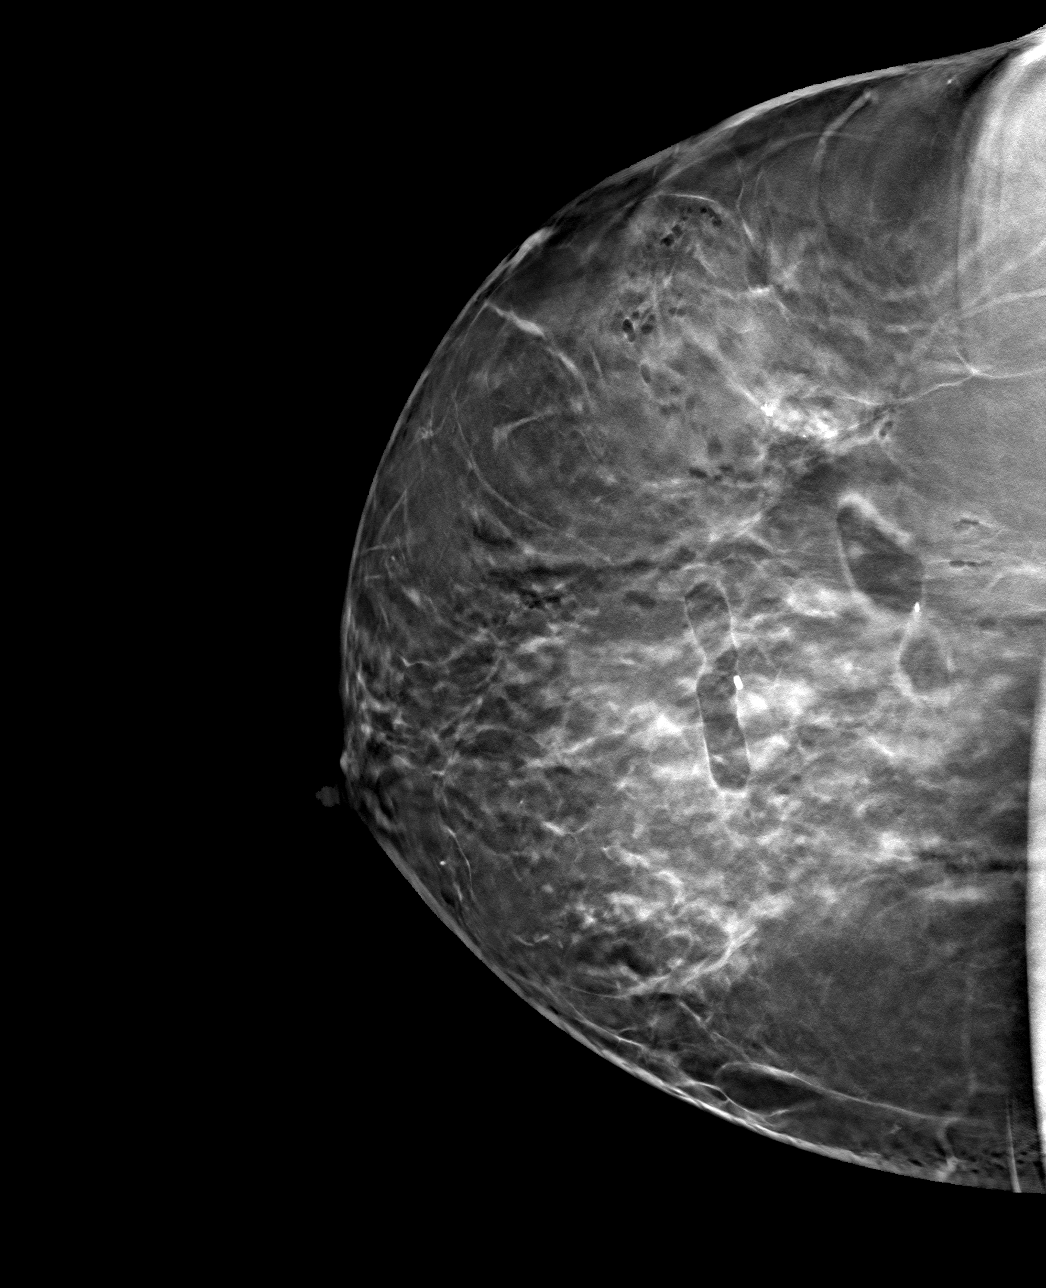

[R ML tomo · tomo slice 55/109.0]
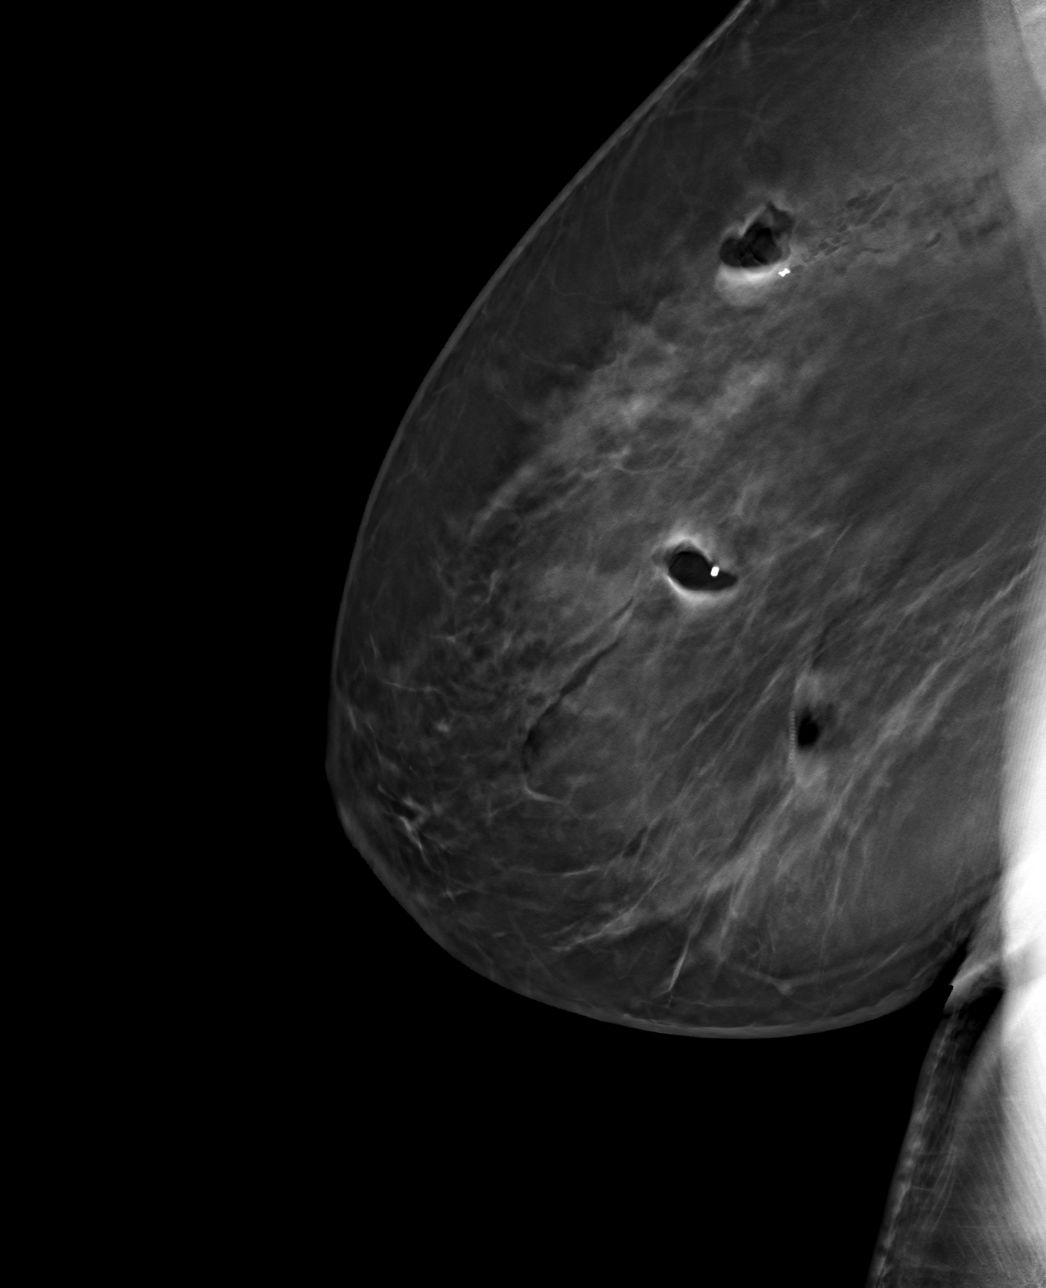

[4 of 12 positions shown; findings below may reference images not displayed]

FINDINGS: 3D Mammographic images were obtained following MR guided biopsies of
the right breast. The biopsy marking clips are in appropriate
position. There is a cylindrical shaped clip in the upper central
right breast, dumbbell-shaped clip located more superiorly in the
upper central right breast and a dumbbell-shaped clip in the lower
outer quadrant of the right breast.
IMPRESSION: Appropriate positioning of the cylindrical and 2 dumbbell shaped
clips in the right breast.

Final Assessment: Post Procedure Mammograms for Marker Placement

## 2021-03-23 IMAGING — MR MR BREAST BX W/ LOC DEV 1ST LEASION IMAGE BX SPEC MR GUIDE*R*
9 of 14 series · 27 of 48 positions shown · IV contrast (10 ml gadavist)
Comparison: Previous exams.
COMPARISON: Previous exams.

Addendum:
CLINICAL DATA: Biopsy proven invasive mammary cancer in the right
breast with a metastatic axillary lymph node. MRI showed 3 areas of
abnormal enhancement in the right breast.
TECHNIQUE: Multiplanar, multisequence MR imaging of the right breast was
performed both before and after administration of intravenous
contrast.

CONTRAST:  10mL GADAVIST GADOBUTROL 1 MMOL/ML IV SOLN

[Series 2: fiducial unilateral · sagittal · 2.0mm · 1.33mm/px · 1 of 60 slices shown]
[im 1/60]
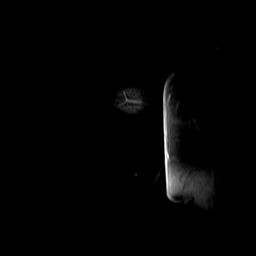

[Series 3: dynamic pre · axial · non-contrast · 1.3mm · 0.73mm/px · z∈[-80,+106]mm · 3 of 144 slices shown]
[im 1/144]
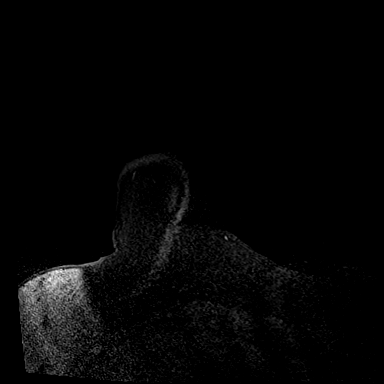
[im 72/144]
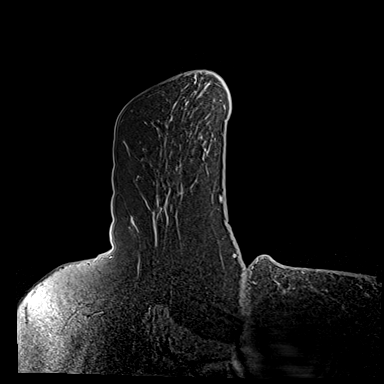
[im 144/144]
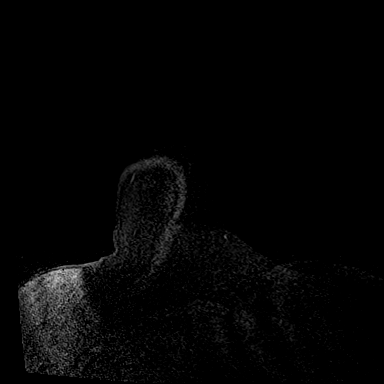

[Series 4: dynamic post 20 · axial · 1.3mm · 0.73mm/px · z∈[-80,+106]mm · 3 of 144 slices shown (1 of 2)]
[im 1/144]
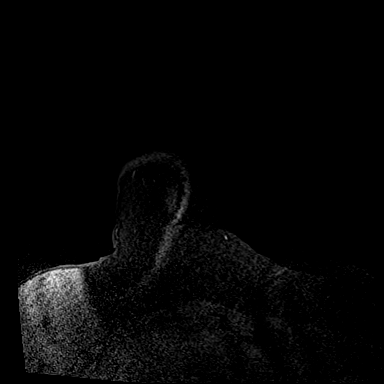
[im 72/144]
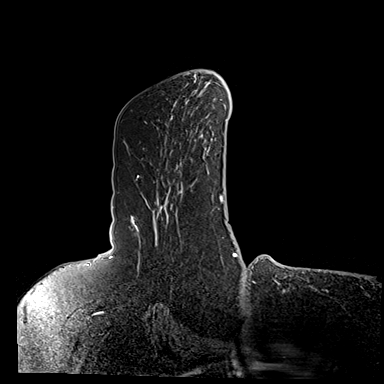
[im 144/144]
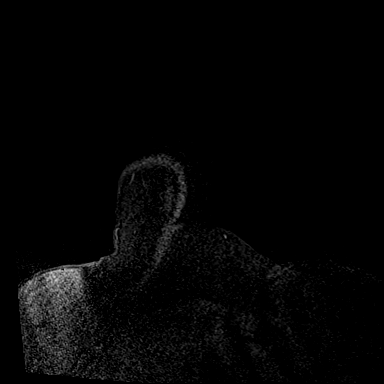

[Series 5: dynamic post 20 · axial · 1.3mm · 0.73mm/px · z∈[-80,+106]mm · 3 of 144 slices shown (2 of 2)]
[im 1/144]
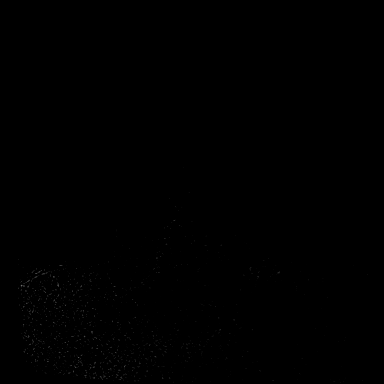
[im 72/144]
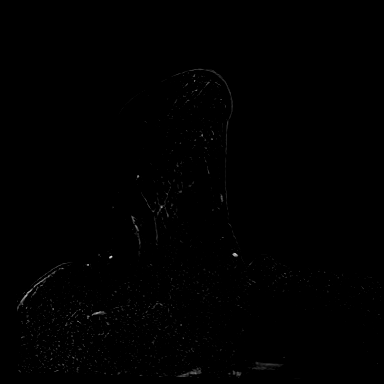
[im 144/144]
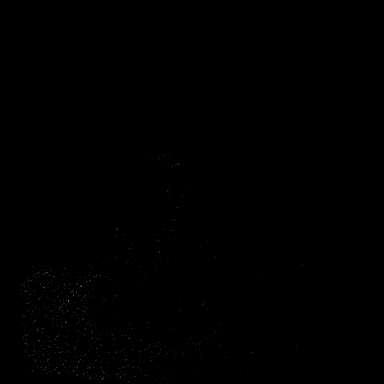

[Series 6: dynamic post 3 · axial · 1.3mm · 0.73mm/px · z∈[-80,+106]mm · 3 of 144 slices shown (1 of 2)]
[im 1/144]
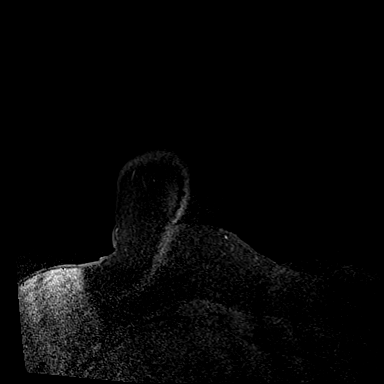
[im 72/144]
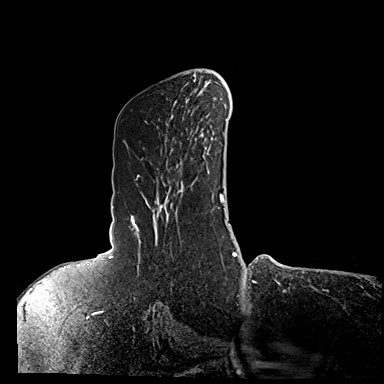
[im 144/144]
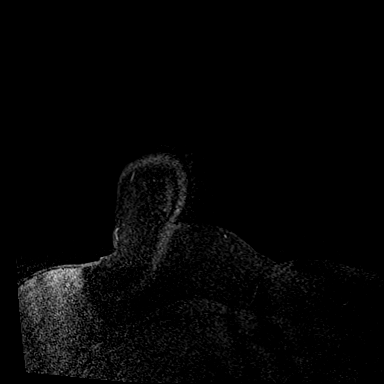

[Series 7: dynamic post 3 · axial · 1.3mm · 0.73mm/px · z∈[-80,+106]mm · 3 of 144 slices shown (2 of 2)]
[im 1/144]
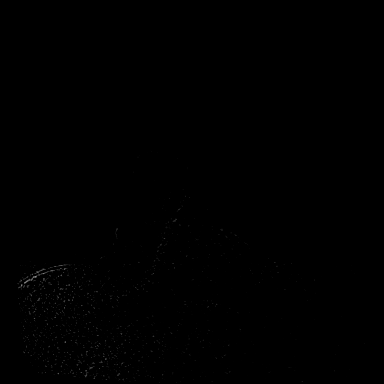
[im 72/144]
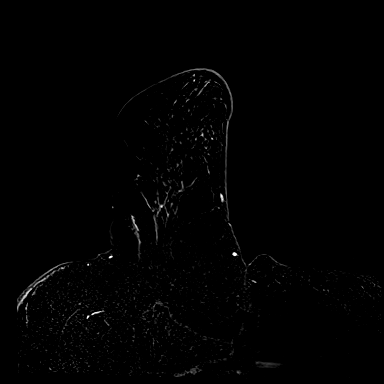
[im 144/144]
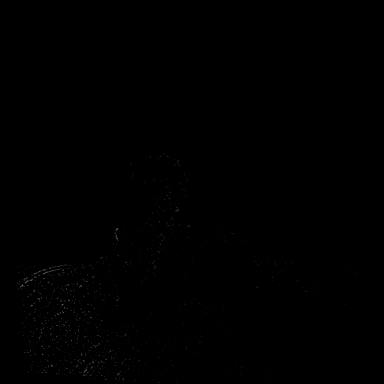

[Series 8: needle confirmation · axial · 1.3mm · 0.73mm/px · z∈[-80,+106]mm · 4 of 144 slices shown]
[im 1/144]
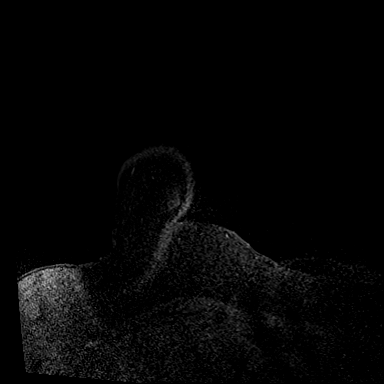
[im 48/144]
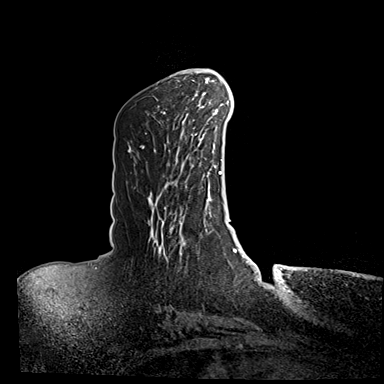
[im 96/144]
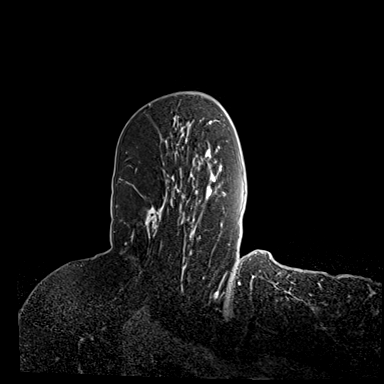
[im 144/144]
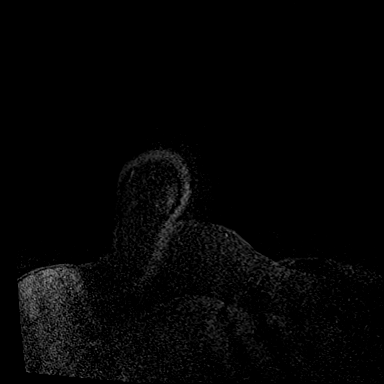

[Series 9: needle confirmation_sub · axial · 1.3mm · 0.73mm/px · z∈[-80,+106]mm · 4 of 144 slices shown]
[im 1/144]
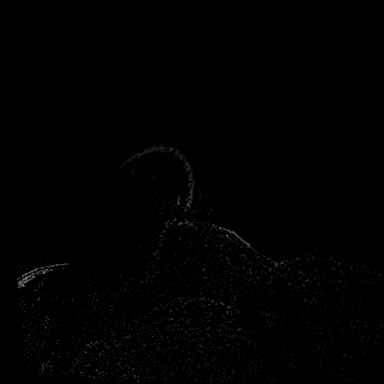
[im 48/144]
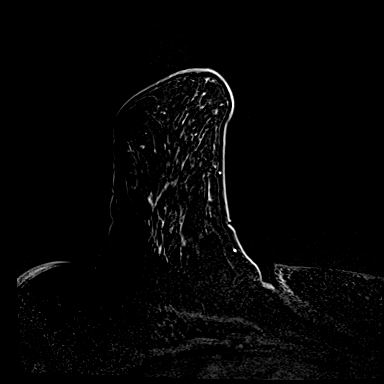
[im 96/144]
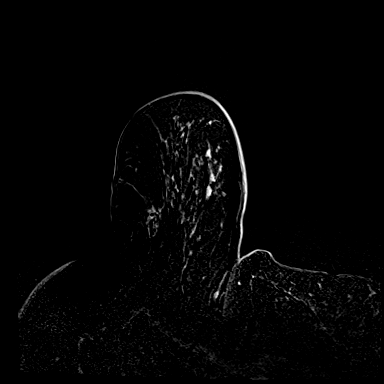
[im 144/144]
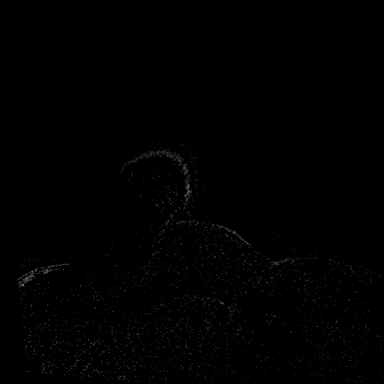

[Series 10: post bx · axial · 1.3mm · 0.73mm/px · z∈[-80,+44]mm · 3 of 144 slices shown]
[im 1/144]
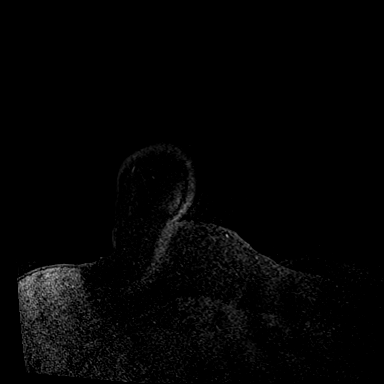
[im 48/144]
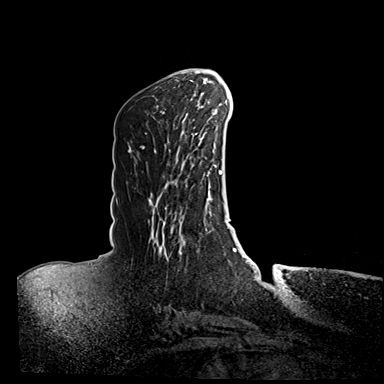
[im 96/144]
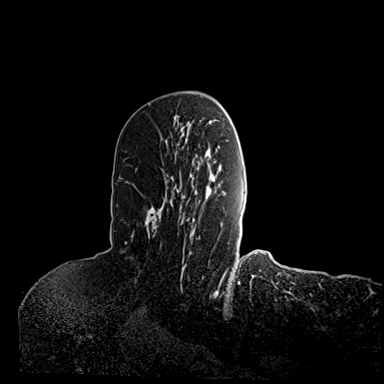

[27 of 48 positions shown; findings below may reference images not displayed]

MR report dated [DATE] recommended biopsies of:

1.   8 mm mass in the superior central right breast.

2.   6 mm mass in the superior central right breast.

3. Non masslike enhancement in the lower outer quadrant of the
breast. It was recommended the anterior and posterior margins be
biopsied. Today MR guided core biopsy the central aspect of the non
masslike enhancement was biopsied instead of the anterior and
posterior margins.

The patient is scheduled for a left breast MR guided core biopsy on
[DATE].

EXAM:
MRI GUIDED CORE NEEDLE BIOPSIES OF THE RIGHT BREAST
FINDINGS: I met with the patient, and we discussed the procedure of MRI guided
biopsy, including risks, benefits, and alternatives. Specifically,
we discussed the risks of infection, bleeding, tissue injury, clip
migration, and inadequate sampling. Informed, written consent was
given. The usual time out protocol was performed immediately prior
to the procedure.

Using sterile technique, 1% lidocaine and 1% lidocaine with
epinephrine, MRI guidance, and a 9 gauge vacuum assisted device,
biopsy was performed of a enhancing mass in the upper central right
breast using a lateral to medial approach. At the conclusion of the
procedure, a cylindrical shaped tissue marker clip was deployed into
the biopsy cavity. Follow-up 2-view mammogram was performed and
dictated separately.

Using sterile technique, 1% lidocaine and 1% lidocaine with
epinephrine, MRI guidance, and a 9 gauge vacuum assisted device,
biopsy was performed of a nodule in the superior central right
breast (posterior depth) using a lateral to medial approach. At the
conclusion of the procedure, dumbbell shaped tissue marker clip was
deployed into the biopsy cavity. Follow-up 2-view mammogram was
performed and dictated separately.

Using sterile technique, 1% lidocaine and 1% lidocaine with
epinephrine, MRI guidance, and a 9 gauge vacuum assisted device,
biopsy was performed of non masslike enhancement in the lower outer
quadrant of the right breast using a lateral to medial approach. At
the conclusion of the procedure, a dumbbell-shaped tissue marker
clip was deployed into the biopsy cavity. Follow-up 2-view mammogram
was performed and dictated separately.
IMPRESSION: Three MR guided core biopsies of the right breast. No apparent
complications.

ADDENDUM:
Pathology revealed FIBROCYSTIC CHANGE WITH ADENOSIS AND USUAL DUCTAL
HYPERPLASIA of the RIGHT breast, upper central (cylindrical clip).
This was found to be concordant by Dr. MAFMUDI.

Pathology revealed FIBROCYSTIC CHANGE WITH FIBROADENOMATOID CHANGE
of the RIGHT breast, upper posterior central (dumbbell clip). This
was found to be concordant by Dr. MAFMUDI.

Pathology revealed FIBROCYSTIC CHANGE WITH ADENOSIS, USUAL DUCTAL
HYPERPLASIA AND CALCIFICATIONS of the RIGHT breast, lower outer
quadrant (dumbbell clip). This was found to be concordant by Dr.
MAFMUDI.

Pathology results were discussed with the patient by telephone. The
patient reported doing well after the biopsies with tenderness at
the sites. Post biopsy instructions and care were reviewed and
questions were answered. The patient was encouraged to call The
direct phone number was provided.

The patient has a recent diagnosis of RIGHT breast cancer and should
follow her outlined treatment plan.

The patient had a LEFT breast MRI guided biopsy on [DATE]. Further recommendations will be guided by the results of this
biopsy.

Dr. MAFMUDI was notified of biopsy results via [REDACTED] message on
[DATE].

Pathology results reported by MAFMUDI, RN on [DATE].

*** End of Addendum ***
MR report dated [DATE] recommended biopsies of:

1.   8 mm mass in the superior central right breast.

2.   6 mm mass in the superior central right breast.

3. Non masslike enhancement in the lower outer quadrant of the
breast. It was recommended the anterior and posterior margins be
biopsied. Today MR guided core biopsy the central aspect of the non
masslike enhancement was biopsied instead of the anterior and
posterior margins.

The patient is scheduled for a left breast MR guided core biopsy on
[DATE].

EXAM:
MRI GUIDED CORE NEEDLE BIOPSIES OF THE RIGHT BREAST
FINDINGS: I met with the patient, and we discussed the procedure of MRI guided
biopsy, including risks, benefits, and alternatives. Specifically,
we discussed the risks of infection, bleeding, tissue injury, clip
migration, and inadequate sampling. Informed, written consent was
given. The usual time out protocol was performed immediately prior
to the procedure.

Using sterile technique, 1% lidocaine and 1% lidocaine with
epinephrine, MRI guidance, and a 9 gauge vacuum assisted device,
biopsy was performed of a enhancing mass in the upper central right
breast using a lateral to medial approach. At the conclusion of the
procedure, a cylindrical shaped tissue marker clip was deployed into
the biopsy cavity. Follow-up 2-view mammogram was performed and
dictated separately.

Using sterile technique, 1% lidocaine and 1% lidocaine with
epinephrine, MRI guidance, and a 9 gauge vacuum assisted device,
biopsy was performed of a nodule in the superior central right
breast (posterior depth) using a lateral to medial approach. At the
conclusion of the procedure, dumbbell shaped tissue marker clip was
deployed into the biopsy cavity. Follow-up 2-view mammogram was
performed and dictated separately.

Using sterile technique, 1% lidocaine and 1% lidocaine with
epinephrine, MRI guidance, and a 9 gauge vacuum assisted device,
biopsy was performed of non masslike enhancement in the lower outer
quadrant of the right breast using a lateral to medial approach. At
the conclusion of the procedure, a dumbbell-shaped tissue marker
clip was deployed into the biopsy cavity. Follow-up 2-view mammogram
was performed and dictated separately.
IMPRESSION: Three MR guided core biopsies of the right breast. No apparent
complications.

## 2021-03-23 MED ORDER — GADOBUTROL 1 MMOL/ML IV SOLN
10.0000 mL | Freq: Once | INTRAVENOUS | Status: AC | PRN
  Administered 2021-03-23: 10 mL via INTRAVENOUS

## 2021-03-24 ENCOUNTER — Ambulatory Visit
Admission: RE | Admit: 2021-03-24 | Discharge: 2021-03-24 | Disposition: A | Discharge status: Home / Self Care | Payer: BC Managed Care – PPO | Source: Ambulatory Visit | Attending: General Surgery | Admitting: General Surgery

## 2021-03-24 ENCOUNTER — Other Ambulatory Visit: Payer: Self-pay

## 2021-03-24 DIAGNOSIS — Z170 Estrogen receptor positive status [ER+]: Secondary | ICD-10-CM

## 2021-03-24 DIAGNOSIS — C50411 Malignant neoplasm of upper-outer quadrant of right female breast: Secondary | ICD-10-CM

## 2021-03-24 IMAGING — MR MR BREAST BX W LOC DEV 1ST LESION IMAGE BX SPEC MR GUIDE*L*
7 of 10 series · 33 of 48 positions shown · IV contrast (10 ml gadavist)
Comparison: Previous exams.
COMPARISON: Previous exams.

Addendum:
CLINICAL DATA: 49-year-old female presents for MRI guided biopsy of
an enhancing mass in the upper inner left breast.

EXAM:
MRI GUIDED CORE NEEDLE BIOPSY OF THE LEFT BREAST
TECHNIQUE: Multiplanar, multisequence MR imaging of the left breast was
performed both before and after administration of intravenous
contrast.
CONTRAST:  10mL GADAVIST GADOBUTROL 1 MMOL/ML IV SOLN

[Series 2: fiducial unilateral · sagittal · 2.0mm · 1.33mm/px · 3 of 56 slices shown]
[im 1/56]
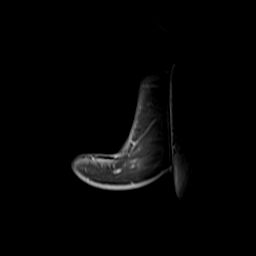
[im 28/56]
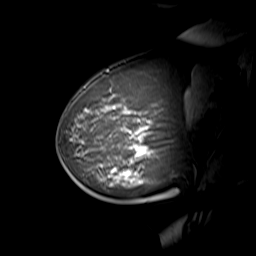
[im 56/56]
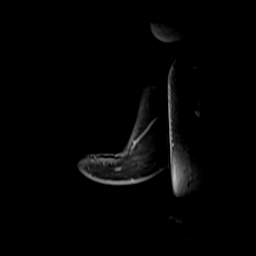

[Series 3: dynamic pre · axial · non-contrast · 1.3mm · 0.73mm/px · z∈[-87,+99]mm · 5 of 144 slices shown]
[im 1/144]
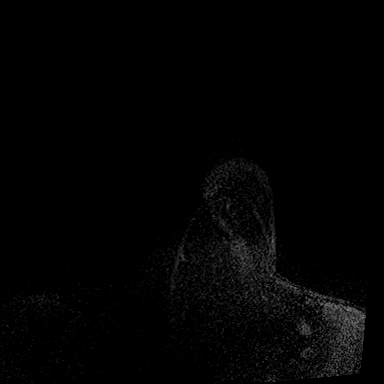
[im 36/144]
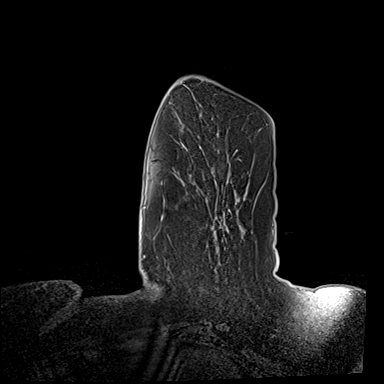
[im 72/144]
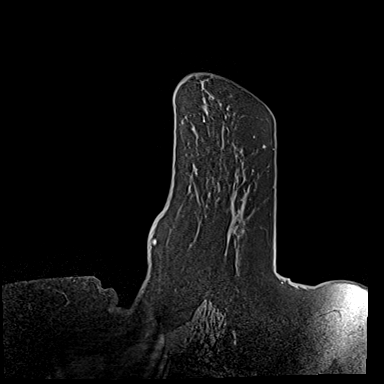
[im 108/144]
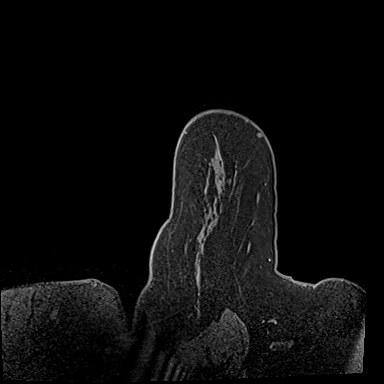
[im 144/144]
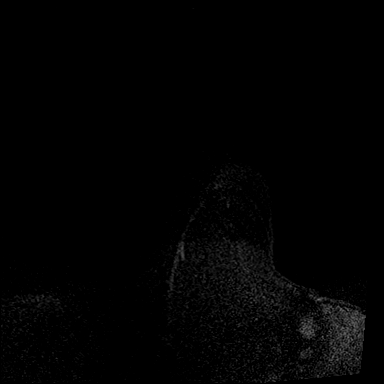

[Series 4: dynamic post 20 · axial · 1.3mm · 0.73mm/px · z∈[-87,+99]mm · 5 of 144 slices shown (1 of 2)]
[im 1/144]
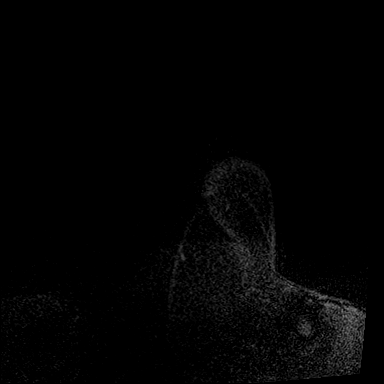
[im 36/144]
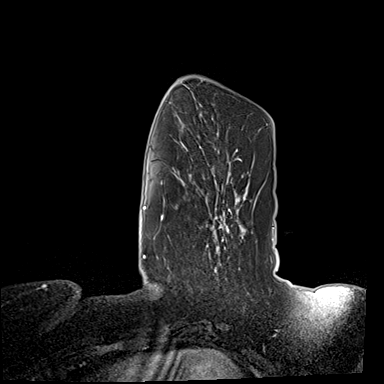
[im 72/144]
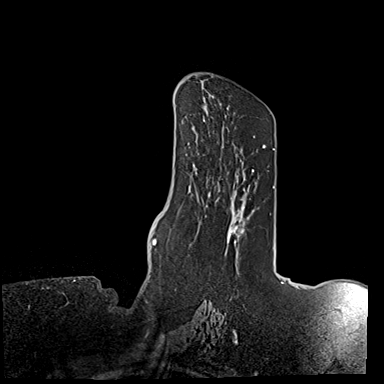
[im 108/144]
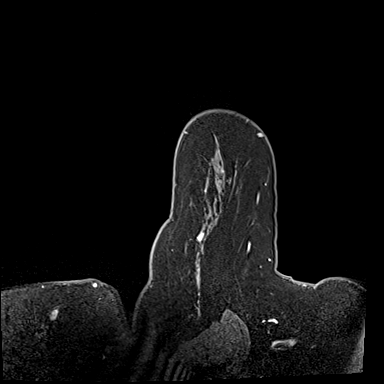
[im 144/144]
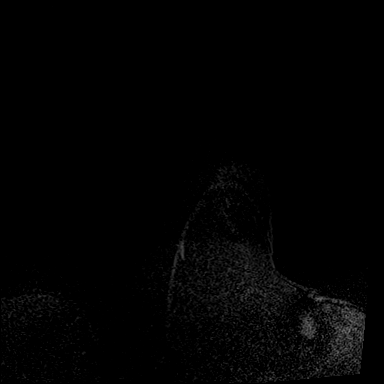

[Series 5: dynamic post 20 · axial · 1.3mm · 0.73mm/px · z∈[-87,+99]mm · 5 of 144 slices shown (2 of 2)]
[im 1/144]
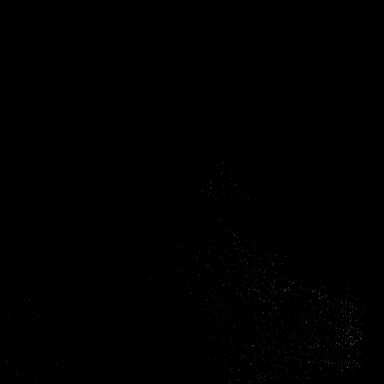
[im 36/144]
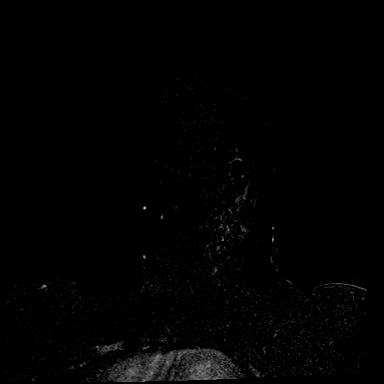
[im 72/144]
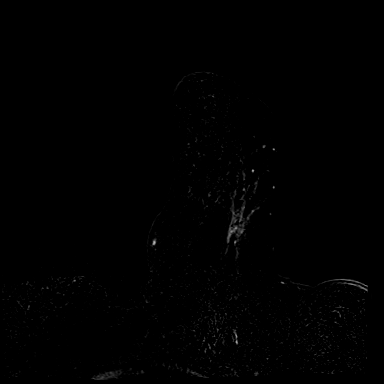
[im 108/144]
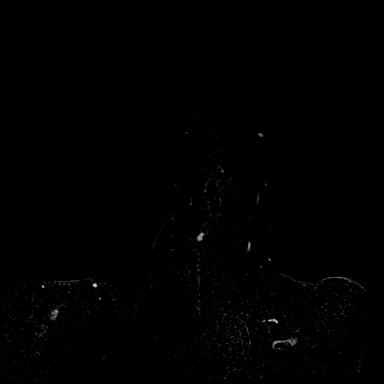
[im 144/144]
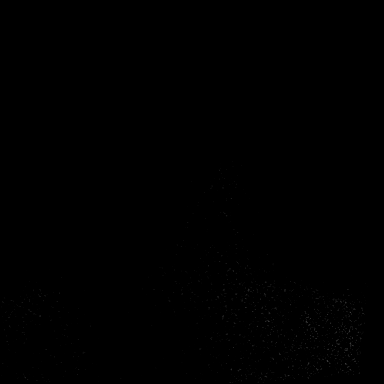

[Series 6: dynamic post 3 · axial · 1.3mm · 0.73mm/px · z∈[-87,+99]mm · 5 of 144 slices shown (1 of 2)]
[im 1/144]
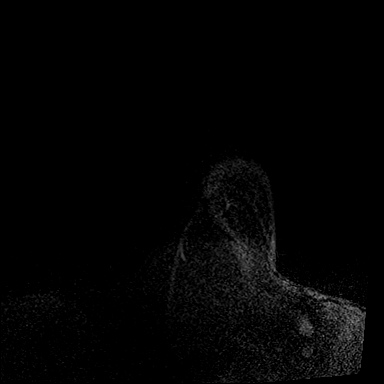
[im 36/144]
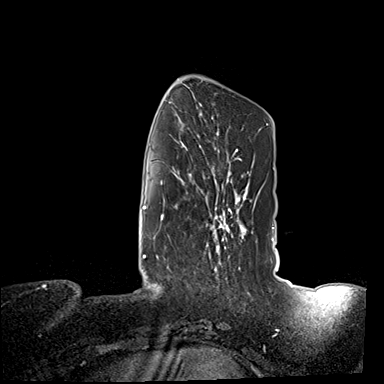
[im 72/144]
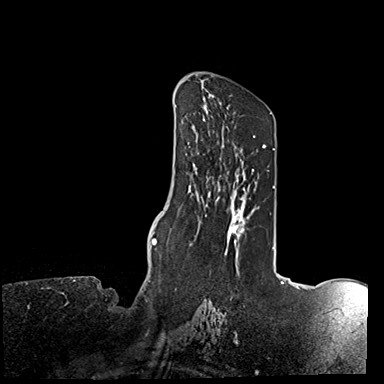
[im 108/144]
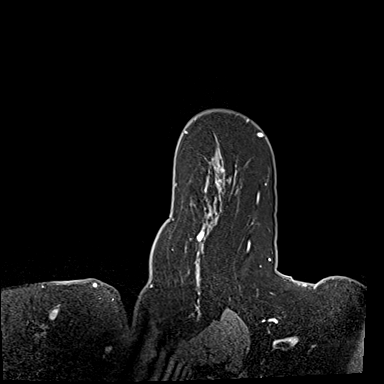
[im 144/144]
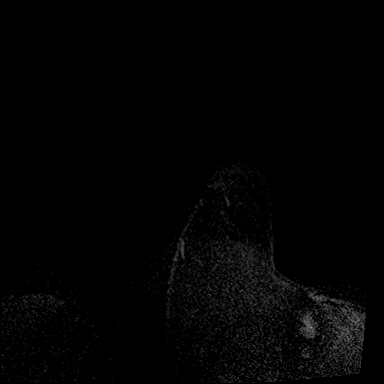

[Series 7: dynamic post 3 · axial · 1.3mm · 0.73mm/px · z∈[-87,+99]mm · 5 of 144 slices shown (2 of 2)]
[im 1/144]
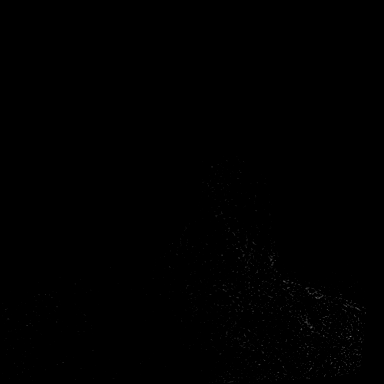
[im 36/144]
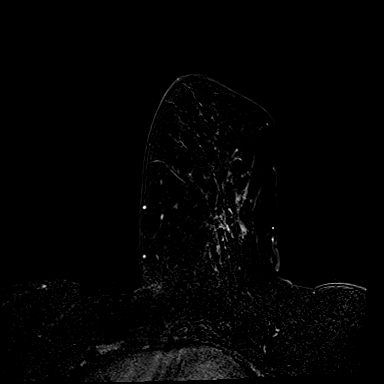
[im 72/144]
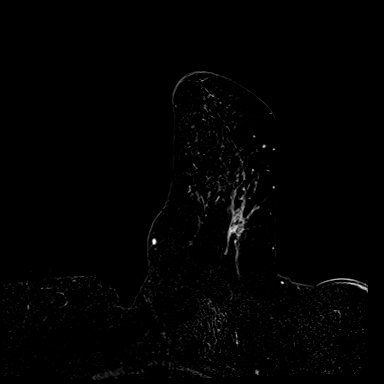
[im 108/144]
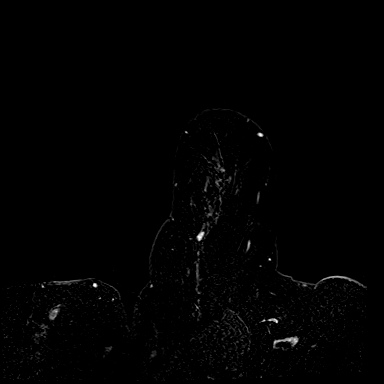
[im 144/144]
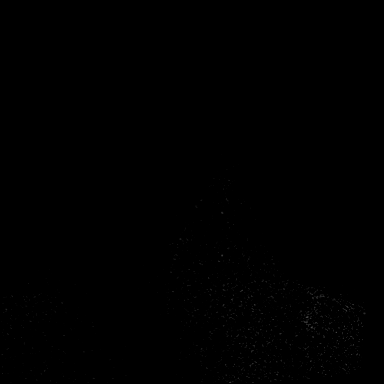

[Series 8: needle confirmation · axial · 1.3mm · 0.73mm/px · z∈[-87,+99]mm · 5 of 144 slices shown]
[im 1/144]
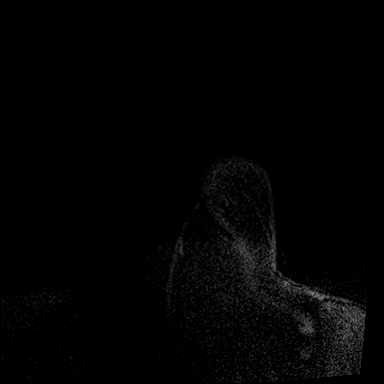
[im 36/144]
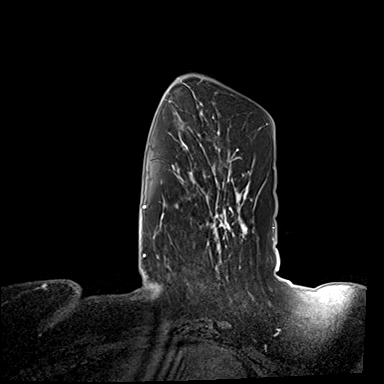
[im 72/144]
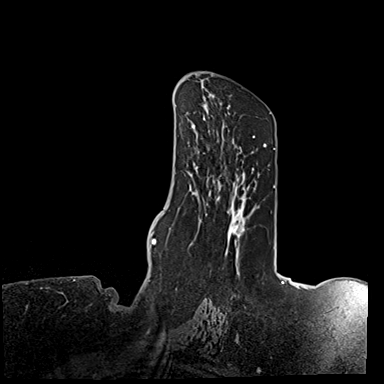
[im 108/144]
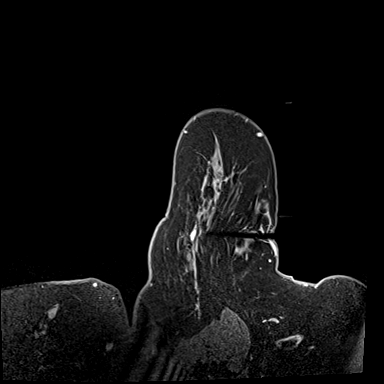
[im 144/144]
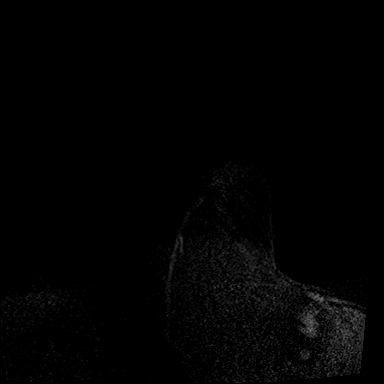

[33 of 48 positions shown; findings below may reference images not displayed]

FINDINGS: I met with the patient, and we discussed the procedure of MRI guided
biopsy, including risks, benefits, and alternatives. Specifically,
we discussed the risks of infection, bleeding, tissue injury, clip
migration, and inadequate sampling. Informed, written consent was
given. The usual time out protocol was performed immediately prior
to the procedure.

Using sterile technique, 1% Lidocaine, MRI guidance, and a 9 gauge
vacuum assisted device, biopsy was performed of the enhancing mass
in the upper inner left breast using a lateral to medial approach.
At the conclusion of the procedure, a a dumbbell shaped tissue
marker clip was deployed into the biopsy cavity. Follow-up 2-view
mammogram was performed and dictated separately.
IMPRESSION: MRI guided biopsy of the enhancing mass in the upper inner left
breast. No apparent complications.

ADDENDUM:
Pathology revealed FIBROADENOMA WITH LOBULAR NEOPLASIA (ATYPICAL
LOBULAR HYPERPLASIA) of the LEFT breast, upper inner quadrant,
(dumbbell clip). This was found to be concordant by Dr. NORMILAH
NORMILAH, with excision recommended.

Pathology results were discussed with the patient by telephone. The
patient reported doing well after the biopsy with tenderness at the
site. Post biopsy instructions and care were reviewed and questions
were answered. The patient was encouraged to call The [REDACTED] for any additional concerns. My direct phone
number was provided.

The patient has a recent diagnosis of RIGHT breast cancer and should
follow her outlined treatment plan.

Dr. NORMILAH was notified of biopsy results on [DATE].

Pathology results reported by NORMILAH, RN on [DATE].

*** End of Addendum ***
FINDINGS: I met with the patient, and we discussed the procedure of MRI guided
biopsy, including risks, benefits, and alternatives. Specifically,
we discussed the risks of infection, bleeding, tissue injury, clip
migration, and inadequate sampling. Informed, written consent was
given. The usual time out protocol was performed immediately prior
to the procedure.

Using sterile technique, 1% Lidocaine, MRI guidance, and a 9 gauge
vacuum assisted device, biopsy was performed of the enhancing mass
in the upper inner left breast using a lateral to medial approach.
At the conclusion of the procedure, a a dumbbell shaped tissue
marker clip was deployed into the biopsy cavity. Follow-up 2-view
mammogram was performed and dictated separately.
IMPRESSION: MRI guided biopsy of the enhancing mass in the upper inner left
breast. No apparent complications.

## 2021-03-24 IMAGING — MG MM BREAST LOCALIZATION CLIP
4 series · 4 of 12 positions shown · non-contrast
Comparison: none

CLINICAL DATA: Post MRI guided biopsy of an enhancing mass in the
upper inner left breast.

EXAM:
3D DIAGNOSTIC LEFT MAMMOGRAM POST MRI BIOPSY

[L ML synth-2D]
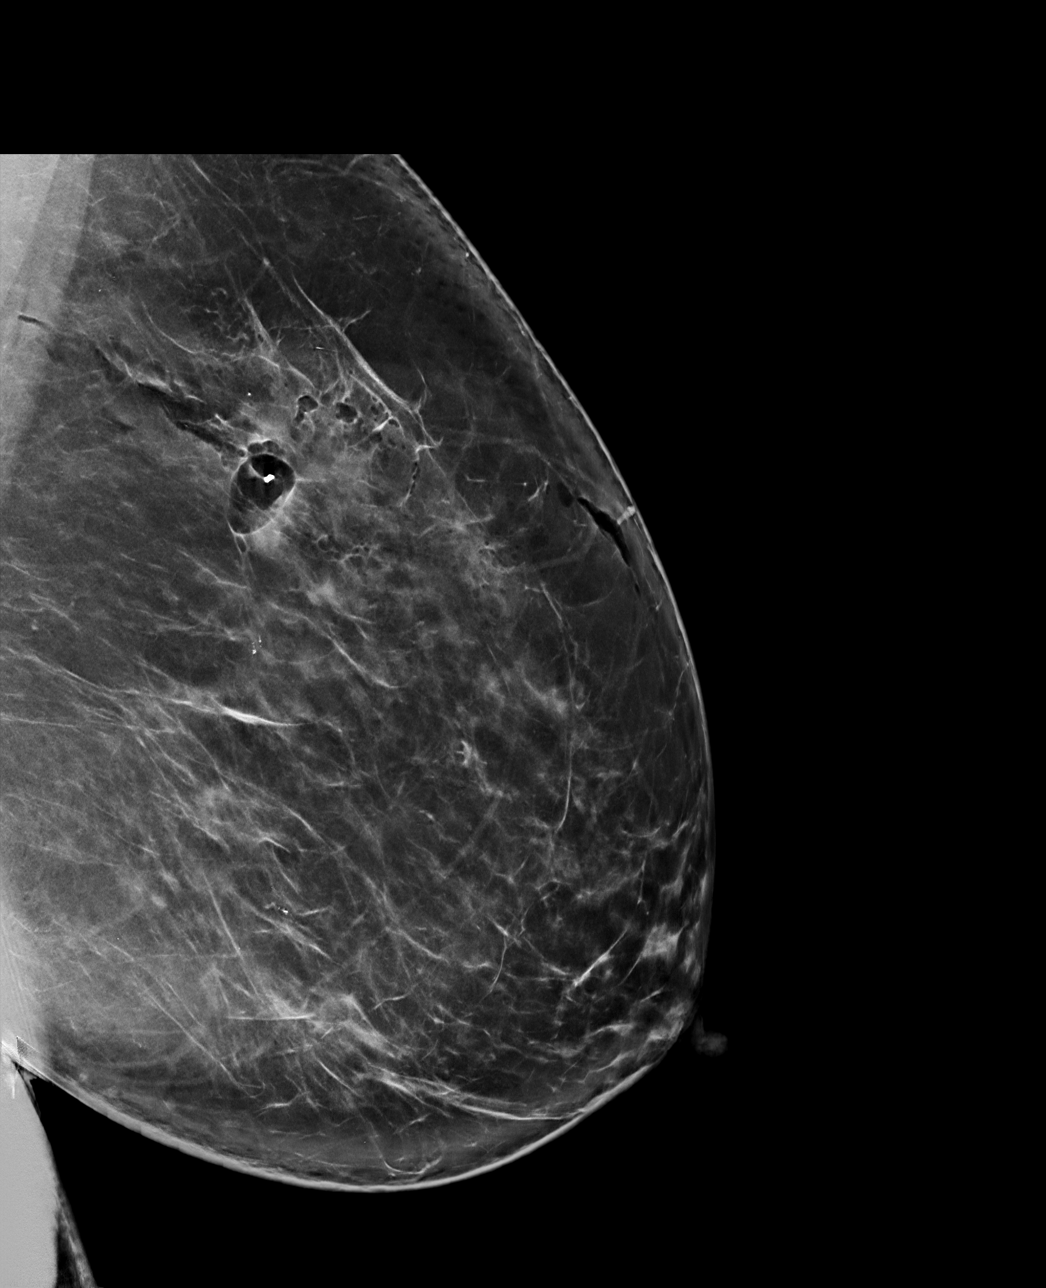

[L CC synth-2D]
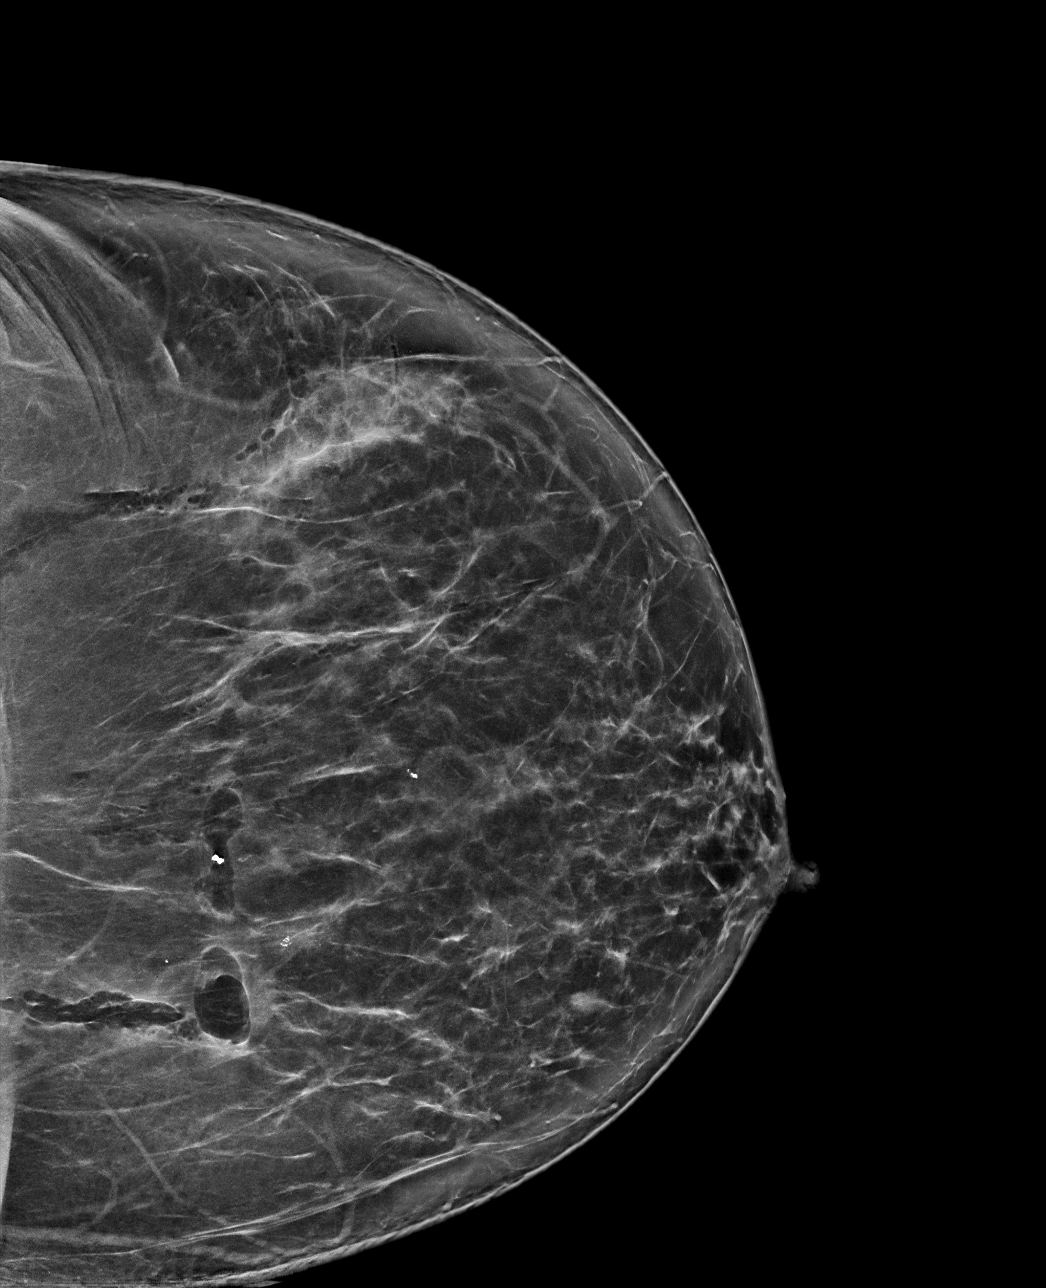

[L ML tomo · tomo slice 51/101.0]
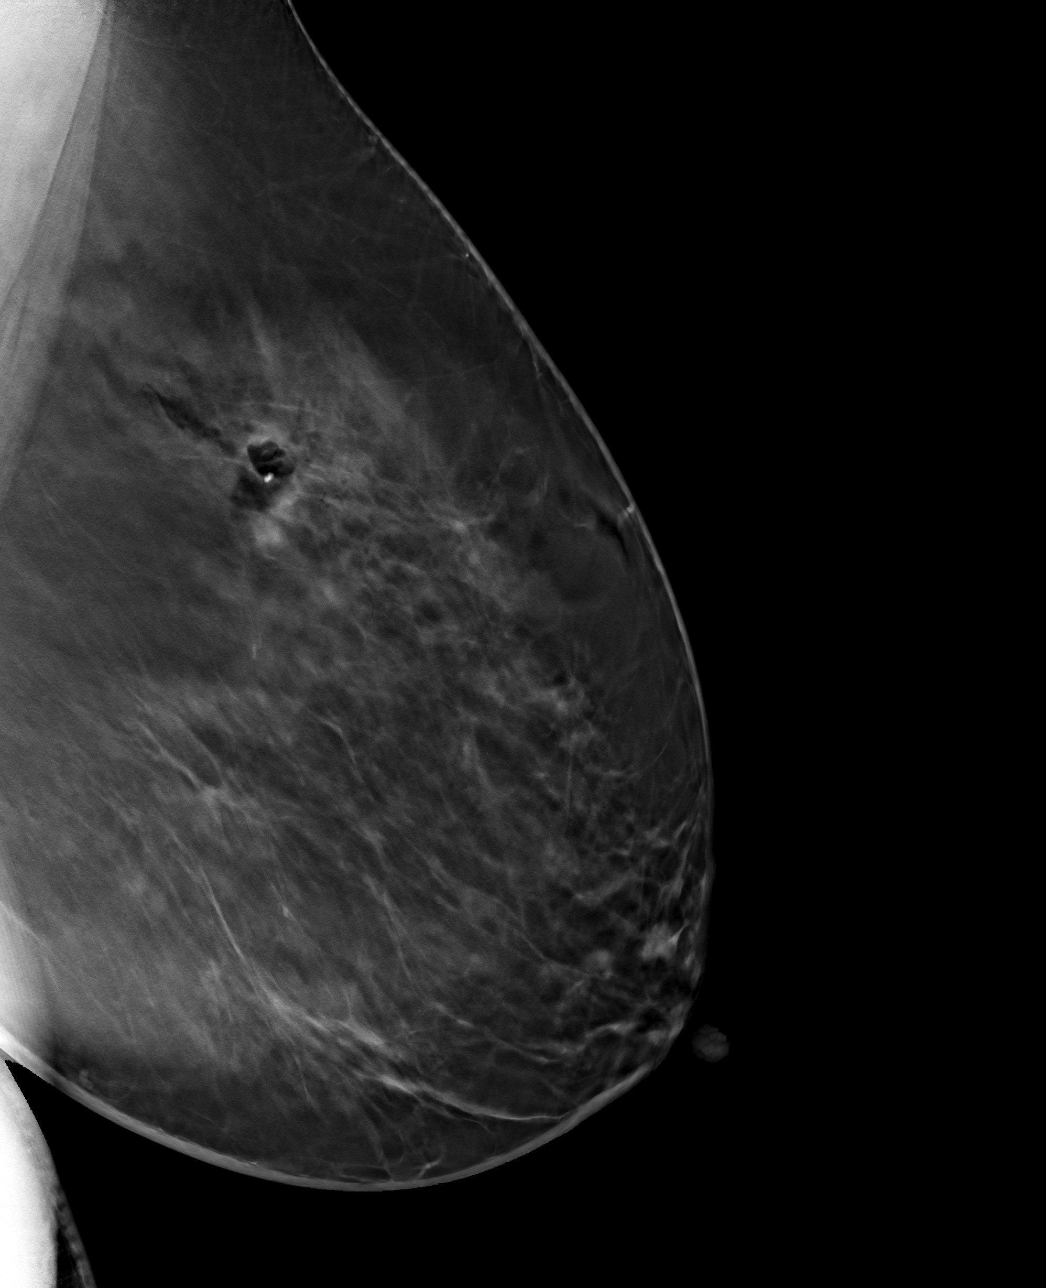

[L CC tomo · tomo slice 47/92.0]
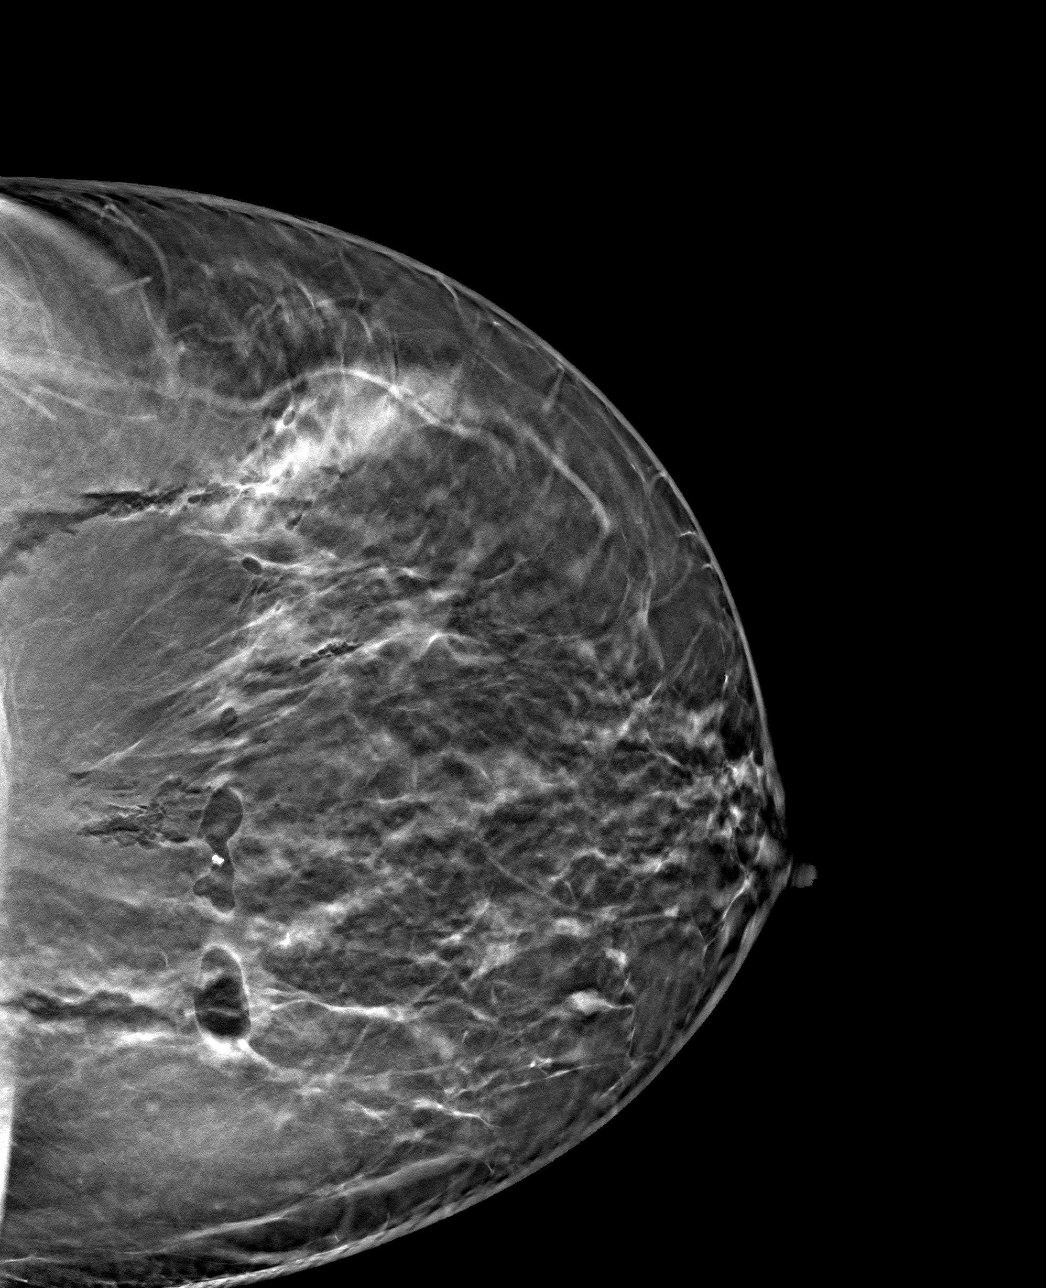

[4 of 12 positions shown; findings below may reference images not displayed]

FINDINGS: 3D Mammographic images were obtained following MRI guided biopsy of
an enhancing mass in the upper inner left breast. A dumbbell shaped
biopsy marking clip is present and felt to be positioned at the
expected site of the biopsied mass in the upper inner left breast.
IMPRESSION: Dumbbell shaped biopsy marking clip at site of biopsied mass in the
upper inner left breast.

Final Assessment: Post Procedure Mammograms for Marker Placement

## 2021-03-24 MED ORDER — GADOBUTROL 1 MMOL/ML IV SOLN
10.0000 mL | Freq: Once | INTRAVENOUS | Status: AC | PRN
  Administered 2021-03-24: 10 mL via INTRAVENOUS

## 2021-03-25 ENCOUNTER — Other Ambulatory Visit: Payer: Self-pay | Admitting: *Deleted

## 2021-03-25 ENCOUNTER — Encounter: Payer: Self-pay | Admitting: *Deleted

## 2021-03-25 MED ORDER — FLUCONAZOLE 100 MG PO TABS: 100.0000 mg | ORAL_TABLET | Freq: Every day | ORAL | 0 refills | Status: DC

## 2021-03-25 NOTE — Progress Notes (Signed)
Received call from pt with complaint of vaginal itching, with white discharge.  Per MD pt to take Diflucan 100 mg p.o tablet today and repeat in 3 days.  Prescription sent to pharmacy on file.  RN educated pt on medication, wearing cotton underwear, and eating yogurt to promote vaginal health.  Pt verbalized understanding and appreciative of advice.

## 2021-03-30 MED FILL — Fosaprepitant Dimeglumine For IV Infusion 150 MG (Base Eq): INTRAVENOUS | Qty: 5 | Status: AC

## 2021-03-30 NOTE — Progress Notes (Signed)
Patient Care Team: [name], MD as PCP - General (Family Medicine) [name], RN as Oncology Nurse Navigator [name], RN as Oncology Nurse Navigator [name], MD as Consulting Physician (General Surgery) [name], MD as Consulting Physician (Hematology and Oncology) [name], MD as Consulting Physician (Radiation Oncology)  DIAGNOSIS:    ICD-10-CM   1. Malignant neoplasm of upper-outer quadrant of right breast in female, estrogen receptor positive ([name])  C50.411    Z17.0       SUMMARY OF ONCOLOGIC HISTORY: Oncology History  Malignant neoplasm of upper-outer quadrant of right breast in female, estrogen receptor positive ([name])  02/15/2021 Initial Diagnosis   Screening mammogram: a possible mass in the right breast  Diagnostic mammogram and [place]: a persistent irregular mass 1.2 cm within the upper-outer right breast. 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    02/23/2021 Cancer Staging   Staging form: Breast, AJCC 8th Edition - Clinical stage from 02/23/2021: Stage IIB (cT1c, cN1(f), cM0, G3, ER+, PR-, HER2-) - Signed by [name], MD on 02/23/2021 Stage prefix: Initial diagnosis Method of lymph node assessment: Core biopsy Histologic grading system: 3 grade system   03/04/2021 -  Chemotherapy    Patient is on Treatment Plan: BREAST ADJUVANT DOSE DENSE AC Q14D / PACLITAXEL Q7D        Genetic Testing   Negative genetic testing. No pathogenic variants identified on the [facility] CancerNext-Expanded+RNA panel. The report date is 03/10/2021.  The CancerNext-Expanded + RNAinsight gene panel offered by [employer] and includes sequencing and rearrangement analysis for the following 77 genes: IP, ALK, APC*, ATM*, AXIN2, BAP1, BARD1, BLM, BMPR1A, BRCA1*, BRCA2*, BRIP1*, CDC73, CDH1*,CDK4, CDKN1B, CDKN2A, CHEK2*, CTNNA1, DICER1, FANCC, FH, FLCN, GALNT12, KIF1B, LZTR1, MAX,  MEN1, MET, MLH1*, MSH2*, MSH3, MSH6*, MUTYH*, NBN, NF1*, NF2, NTHL1, PALB2*, PHOX2B, PMS2*, POT1, PRKAR1A, PTCH1, PTEN*, RAD51C*, RAD51D*,RB1, RECQL, RET, SDHA, SDHAF2, SDHB, SDHC, SDHD, SMAD4, SMARCA4, SMARCB1, SMARCE1, STK11, SUFU, TMEM127, TP53*,TSC1, TSC2, VHL and XRCC2 (sequencing and deletion/duplication); EGFR, EGLN1, HOXB13, KIT, MITF, PDGFRA, POLD1 and POLE (sequencing only); EPCAM and GREM1 (deletion/duplication only).     CHIEF COMPLIANT: Cycle 3 adjuvant chemo with dose dense Adriamycin and Cytoxan  INTERVAL HISTORY: Sandra Bishop is a 50 y.o. with above-mentioned history of invasive ductal carcinoma of the right breast, status post first cycle of chemotherapy with dose dense Adriamycin and Cytoxan.  She presents to the clinic today for treatment.  After last cycle of chemotherapy she had mild nausea for which she took antiemetics.  Also had constipation.  Body aches and pains as well.  ALLERGIES:  has No Known Allergies.  MEDICATIONS:  Current Outpatient Medications  Medication Sig Dispense Refill   aspirin EC 81 MG tablet Take 81 mg by mouth daily. Swallow whole.     blood glucose meter kit and supplies Dispense based on patient and insurance preference. Use up to four times daily as directed. (FOR ICD-10 E10.9, E11.9).  ( One-Touch Verio meter) 1 each 0   Cholecalciferol (VITAMIN D3) 50 MCG (2000 UT) TABS Take 2,000 Units by mouth daily.     fluconazole (DIFLUCAN) 100 MG tablet Take 1 tablet (100 mg total) by mouth daily. Take one tablet today 03/25/21 and second tablet on 03/27/21. 2 tablet 0   Lancets (ONETOUCH ULTRASOFT) lancets Use to check blood glucose three times daily. Use as instructed. E11.9. 100 each 12   lidocaine-prilocaine (EMLA) cream Apply to  affected area once 30 g 3   naproxen sodium (ALEVE) 220 MG tablet Take 440 mg by mouth 2 (two) times daily as needed (pain.).     nystatin-triamcinolone ointment (MYCOLOG) Apply 1 application topically 2 (two) times daily. 30  g 0   ondansetron (ZOFRAN) 8 MG tablet Take 1 tablet (8 mg total) by mouth 2 (two) times daily as needed. Start on the third day after chemotherapy. 30 tablet 1   ONETOUCH VERIO test strip TEST FOUR TIMES DAILY AS DIRECTED 100 strip 2   prochlorperazine (COMPAZINE) 10 MG tablet Take 1 tablet (10 mg total) by mouth every 6 (six) hours as needed (Nausea or vomiting). 30 tablet 1   rosuvastatin (CRESTOR) 5 MG tablet Take 5 mg by mouth every Tuesday.     Semaglutide (RYBELSUS) 3 MG TABS Take 3 mg by mouth daily before breakfast.     traZODone (DESYREL) 50 MG tablet Take 1 tablet (50 mg total) by mouth at bedtime.     Turmeric 450 MG CAPS Take 900 mg by mouth daily.     No current facility-administered medications for this visit.    PHYSICAL EXAMINATION: ECOG PERFORMANCE STATUS: 1 - Symptomatic but completely ambulatory  Vitals:   03/31/21 0911  BP: 120/83  Pulse: 100  Resp: 19  Temp: (!) 97.3 F (36.3 C)  SpO2: 100%   Filed Weights   03/31/21 0911  Weight: 229 lb 14.4 oz (104.3 kg)     LABORATORY DATA:  I have reviewed the data as listed CMP Latest Ref Rng & Units 03/17/2021 03/10/2021 03/03/2021  Glucose 70 - 99 mg/dL 202(H) 191(H) 137(H)  BUN 6 - 20 mg/dL _0 Creatinine 0.44 - 1.00 mg/dL 0.81 0.76 0.82  Sodium 135 - 145 mmol/L 140 137 138  Potassium 3.5 - 5.1 mmol/L 3.8 4.1 3.6  Chloride 98 - 111 mmol/L 106 104 107  CO2 22 - 32 mmol/L _1 Calcium 8.9 - 10.3 mg/dL 8.8(L) 9.0 8.7(L)  Total Protein 6.5 - 8.1 g/dL 7.2 7.3 7.5  Total Bilirubin 0.3 - 1.2 mg/dL <0.2(L) 0.7 0.2(L)  Alkaline Phos 38 - 126 U/L 78 82 71  AST 15 - 41 U/L 17 10(L) 13(L)  ALT 0 - 44 U/L _2 Lab Results  Component Value Date   WBC 17.3 (H) 03/31/2021   HGB 9.9 (L) 03/31/2021   HCT 31.0 (L) 03/31/2021   MCV 81.6 03/31/2021   PLT 230 03/31/2021   NEUTROABS PENDING 03/31/2021    ASSESSMENT & PLAN:  Malignant neoplasm of upper-outer quadrant of right breast in female, estrogen  receptor positive ([name]) 02/15/2021:Screening mammogram: a possible mass in the right breast  Diagnostic mammogram and [place]: a persistent irregular mass 1.2 cm within the upper-outer right breast. 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    Recommendation based on multidisciplinary tumor board: 1. Neoadjuvant chemotherapy with Adriamycin and Cytoxan dose dense 4 followed by Taxol weekly 12  2. Followed by breast conserving surgery with  targeted axillary dissection 3. Followed by adjuvant radiation therapy 4.  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   Chemo Toxicities:  Mild nausea for which she takes nausea medication Constipation Body aches and pains Elevated blood sugars: Unfortunately she ate lot of high carb foods yesterday.   I reviewed the results of the biopsies.  We will have to decide in the tumor board whether or not to remove the left-sided ALH. Return to clinic in 2 weeks for Cycle 4    No orders of the defined types were placed in this encounter.  The patient has a good understanding of the overall plan. she agrees with it. she will call with any  problems that may develop before the next visit here.  Total time spent: 30 mins including face to face time and time spent for planning, charting and coordination of care  Rulon Eisenmenger, MD, MPH 03/31/2021  I, Thana Ates, am acting as scribe for Dr. [name].  I have reviewed the above documentation for accuracy and completeness, and I agree with the above.

## 2021-03-31 ENCOUNTER — Inpatient Hospital Stay: Payer: BC Managed Care – PPO

## 2021-03-31 ENCOUNTER — Other Ambulatory Visit: Payer: Self-pay

## 2021-03-31 ENCOUNTER — Inpatient Hospital Stay (HOSPITAL_BASED_OUTPATIENT_CLINIC_OR_DEPARTMENT_OTHER): Payer: BC Managed Care – PPO | Admitting: Hematology and Oncology

## 2021-03-31 ENCOUNTER — Encounter: Payer: Self-pay | Admitting: Hematology and Oncology

## 2021-03-31 DIAGNOSIS — C50411 Malignant neoplasm of upper-outer quadrant of right female breast: Secondary | ICD-10-CM

## 2021-03-31 DIAGNOSIS — Z170 Estrogen receptor positive status [ER+]: Secondary | ICD-10-CM

## 2021-03-31 DIAGNOSIS — Z95828 Presence of other vascular implants and grafts: Secondary | ICD-10-CM

## 2021-03-31 DIAGNOSIS — Z5111 Encounter for antineoplastic chemotherapy: Secondary | ICD-10-CM | POA: Diagnosis not present

## 2021-03-31 LAB — COMPREHENSIVE METABOLIC PANEL
ALT: 17 U/L (ref 0–44)
AST: 13 U/L — ABNORMAL LOW (ref 15–41)
Albumin: 3.7 g/dL (ref 3.5–5.0)
Alkaline Phosphatase: 84 U/L (ref 38–126)
Anion gap: 9 (ref 5–15)
BUN: 8 mg/dL (ref 6–20)
CO2: 24 mmol/L (ref 22–32)
Calcium: 9.3 mg/dL (ref 8.9–10.3)
Chloride: 105 mmol/L (ref 98–111)
Creatinine, Ser: 0.82 mg/dL (ref 0.44–1.00)
GFR, Estimated: >60 mL/min (ref >60)
Glucose, Bld: 171 mg/dL — ABNORMAL HIGH (ref 70–99)
Potassium: 4 mmol/L (ref 3.5–5.1)
Sodium: 138 mmol/L (ref 135–145)
Total Bilirubin: <0.2 mg/dL — ABNORMAL LOW (ref 0.3–1.2)
Total Protein: 7.6 g/dL (ref 6.5–8.1)

## 2021-03-31 LAB — CBC WITH DIFFERENTIAL/PLATELET
Abs Immature Granulocytes: 3.44 10*3/uL — ABNORMAL HIGH (ref 0.00–0.07)
Basophils Absolute: 0.1 10*3/uL (ref 0.0–0.1)
Basophils Relative: 1 %
Eosinophils Absolute: 0 10*3/uL (ref 0.0–0.5)
Eosinophils Relative: 0 %
HCT: 31 % — ABNORMAL LOW (ref 36.0–46.0)
Hemoglobin: 9.9 g/dL — ABNORMAL LOW (ref 12.0–15.0)
Immature Granulocytes: 20 %
Lymphocytes Relative: 11 %
Lymphs Abs: 1.9 10*3/uL (ref 0.7–4.0)
MCH: 26.1 pg (ref 26.0–34.0)
MCHC: 31.9 g/dL (ref 30.0–36.0)
MCV: 81.6 fL (ref 80.0–100.0)
Monocytes Absolute: 1.8 10*3/uL — ABNORMAL HIGH (ref 0.1–1.0)
Monocytes Relative: 11 %
Neutro Abs: 10.1 10*3/uL — ABNORMAL HIGH (ref 1.7–7.7)
Neutrophils Relative %: 57 %
Platelets: 230 10*3/uL (ref 150–400)
RBC: 3.8 MIL/uL — ABNORMAL LOW (ref 3.87–5.11)
RDW: 16.2 % — ABNORMAL HIGH (ref 11.5–15.5)
WBC: 17.3 10*3/uL — ABNORMAL HIGH (ref 4.0–10.5)
nRBC: 0.1 % (ref 0.0–0.2)

## 2021-03-31 MED ORDER — SODIUM CHLORIDE 0.9% FLUSH
10.0000 mL | INTRAVENOUS | Status: DC | PRN
  Administered 2021-03-31: 10 mL

## 2021-03-31 MED ORDER — PALONOSETRON HCL INJECTION 0.25 MG/5ML
0.2500 mg | Freq: Once | INTRAVENOUS | Status: AC
  Administered 2021-03-31: 0.25 mg via INTRAVENOUS
  Filled 2021-03-31: qty 5

## 2021-03-31 MED ORDER — SODIUM CHLORIDE 0.9 % IV SOLN: Freq: Once | INTRAVENOUS | Status: AC

## 2021-03-31 MED ORDER — SODIUM CHLORIDE 0.9% FLUSH
10.0000 mL | Freq: Once | INTRAVENOUS | Status: AC
  Administered 2021-03-31: 10 mL

## 2021-03-31 MED ORDER — DOXORUBICIN HCL CHEMO IV INJECTION 2 MG/ML
60.0000 mg/m2 | Freq: Once | INTRAVENOUS | Status: AC
  Administered 2021-03-31: 130 mg via INTRAVENOUS
  Filled 2021-03-31: qty 65

## 2021-03-31 MED ORDER — SODIUM CHLORIDE 0.9 % IV SOLN
600.0000 mg/m2 | Freq: Once | INTRAVENOUS | Status: AC
  Administered 2021-03-31: 1300 mg via INTRAVENOUS
  Filled 2021-03-31: qty 65

## 2021-03-31 MED ORDER — SODIUM CHLORIDE 0.9 % IV SOLN
150.0000 mg | Freq: Once | INTRAVENOUS | Status: AC
  Administered 2021-03-31: 150 mg via INTRAVENOUS
  Filled 2021-03-31: qty 150

## 2021-03-31 MED ORDER — HEPARIN SOD (PORK) LOCK FLUSH 100 UNIT/ML IV SOLN
500.0000 [IU] | Freq: Once | INTRAVENOUS | Status: AC | PRN
  Administered 2021-03-31: 500 [IU]

## 2021-03-31 NOTE — Assessment & Plan Note (Signed)
02/15/2021:Screening mammogram: a possible mass in the right breast Diagnostic mammogram and [place]: a persistent irregular mass 1.2 cm within the upper-outer right breast. 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  Recommendationbased on multidisciplinary tumor board: 1. Neoadjuvant chemotherapy with Adriamycin and Cytoxan dose dense 4 followed by Taxol weekly 12  2. Followed by breast conserving surgery with targeted axillary dissection 3. Followed by adjuvant radiation therapy 4.Followed by adjuvant antiestrogen therapy  Breast MRI 02/28/2021: Biopsy-proven malignancy 1.8 cm right breast, additional 4.3 cm area of non-mass enhancement (multiple additional oval masses in the right breast: 6 mm, 8 mm, 8 mm, 1 cm), left breast 0.8 cm oval mass indeterminate, 1 enlarged right axillary lymph node (recommendation: Biopsy of non-mass enhancement, biopsy of 2 indeterminate right breast masses and biopsy of left breast) ------------------------------------------------------------------------------------------------------------------------------------ Current treatment: Cycle 3dose dense Adriamycin and Cytoxan(she started 03/04/2021) Because of her diabetes we decided to discontinue dexamethasone.  Chemo Toxicities:Denies any nausea or vomiting. Elevated blood sugars: She will cut down some other sugary drinks that she is taking.  Return to clinic in 2 weeks forCycle 4

## 2021-03-31 NOTE — Patient Instructions (Signed)
[facility]   Discharge Instructions: Thank you for choosing [facility] to provide your oncology and hematology care.   If you have a lab appointment with the [facility], please go directly to the [facility] and check in at the registration area.   Wear comfortable clothing and clothing appropriate for easy access to any Portacath or PICC line.   We strive to give you quality time with your provider. You may need to reschedule your appointment if you arrive late (15 or more minutes).  Arriving late affects you and other patients whose appointments are after yours.  Also, if you miss three or more appointments without notifying the office, you may be dismissed from the clinic at the provider's discretion.      For prescription refill requests, have your pharmacy contact our office and allow 72 hours for refills to be completed.    Today you received the following chemotherapy and/or immunotherapy agents: cyclophosphamide and doxorubicin.      To help prevent nausea and vomiting after your treatment, we encourage you to take your nausea medication as directed.  BELOW ARE SYMPTOMS THAT SHOULD BE REPORTED IMMEDIATELY: *FEVER GREATER THAN 100.4 F (38 C) OR HIGHER *CHILLS OR SWEATING *NAUSEA AND VOMITING THAT IS NOT CONTROLLED WITH YOUR NAUSEA MEDICATION *UNUSUAL SHORTNESS OF BREATH *UNUSUAL BRUISING OR BLEEDING *URINARY PROBLEMS (pain or burning when urinating, or frequent urination) *BOWEL PROBLEMS (unusual diarrhea, constipation, pain near the anus) TENDERNESS IN MOUTH AND THROAT WITH OR WITHOUT PRESENCE OF ULCERS (sore throat, sores in mouth, or a toothache) UNUSUAL RASH, SWELLING OR PAIN  UNUSUAL VAGINAL DISCHARGE OR ITCHING   Items with * indicate a potential emergency and should be followed up as soon as possible or go to the Emergency Department if any problems should occur.  Please show the CHEMOTHERAPY ALERT CARD or IMMUNOTHERAPY  ALERT CARD at check-in to the Emergency Department and triage nurse.  Should you have questions after your visit or need to cancel or reschedule your appointment, please contact [facility]  Dept: [phone]  and follow the prompts.  Office hours are 8:00 a.m. to 4:30 p.m. Monday - Friday. Please note that voicemails left after 4:00 p.m. may not be returned until the following business day.  We are closed weekends and major holidays. You have access to a nurse at all times for urgent questions. Please call the main number to the clinic Dept: [phone] and follow the prompts.   For any non-urgent questions, you may also contact your provider using MyChart. We now offer e-Visits for anyone 61 and older to request care online for non-urgent symptoms. For details visit [email or web address].   Also download the MyChart app! Go to the app store, search "MyChart", open the app, select [facility], and log in with your MyChart username and password.  Due to Covid, a mask is required upon entering the hospital/clinic. If you do not have a mask, one will be given to you upon arrival. For doctor visits, patients may have 1 support person aged 66 or older with them. For treatment visits, patients cannot have anyone with them due to current Covid guidelines and our immunocompromised population.

## 2021-03-31 NOTE — Progress Notes (Signed)
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  Patient approved for grant. Discussed in detail expenses and how they are covered. She has a copy of approval letter and expense sheet along with the Outpatient pharmacy information. She received a gift card today from grant.  She has my card for any additional financial questions or concerns.

## 2021-04-02 ENCOUNTER — Inpatient Hospital Stay: Payer: BC Managed Care – PPO

## 2021-04-02 ENCOUNTER — Other Ambulatory Visit: Payer: Self-pay

## 2021-04-02 VITALS — BP 138/101 | HR 95 | Temp 98.5°F | Resp 18

## 2021-04-02 DIAGNOSIS — C50411 Malignant neoplasm of upper-outer quadrant of right female breast: Secondary | ICD-10-CM

## 2021-04-02 DIAGNOSIS — Z5111 Encounter for antineoplastic chemotherapy: Secondary | ICD-10-CM | POA: Diagnosis not present

## 2021-04-02 MED ORDER — PEGFILGRASTIM-CBQV 6 MG/0.6ML ~~LOC~~ SOSY
6.0000 mg | PREFILLED_SYRINGE | Freq: Once | SUBCUTANEOUS | Status: AC
  Administered 2021-04-02: 6 mg via SUBCUTANEOUS

## 2021-04-02 MED ORDER — PEGFILGRASTIM-CBQV 6 MG/0.6ML ~~LOC~~ SOSY
PREFILLED_SYRINGE | SUBCUTANEOUS | Status: AC
  Filled 2021-04-02: qty 0.6

## 2021-04-13 ENCOUNTER — Encounter: Payer: Self-pay | Admitting: *Deleted

## 2021-04-13 MED FILL — Fosaprepitant Dimeglumine For IV Infusion 150 MG (Base Eq): INTRAVENOUS | Qty: 5 | Status: AC

## 2021-04-13 NOTE — Progress Notes (Signed)
Patient Care Team: [name], MD as PCP - General (Family Medicine) [name], RN as Oncology Nurse Navigator [name], RN as Oncology Nurse Navigator [name], MD as Consulting Physician (General Surgery) [name], MD as Consulting Physician (Hematology and Oncology) [name], MD as Consulting Physician (Radiation Oncology)  DIAGNOSIS:    ICD-10-CM   1. Malignant neoplasm of upper-outer quadrant of right breast in female, estrogen receptor positive ([place])  C50.411    Z17.0       SUMMARY OF ONCOLOGIC HISTORY: Oncology History  Malignant neoplasm of upper-outer quadrant of right breast in female, estrogen receptor positive ([place])  02/15/2021 Initial Diagnosis   Screening mammogram: a possible mass in the right breast  Diagnostic mammogram and [place]: a persistent irregular mass 1.2 cm within the upper-outer right breast. 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    02/23/2021 Cancer Staging   Staging form: Breast, AJCC 8th Edition - Clinical stage from 02/23/2021: Stage IIB (cT1c, cN1(f), cM0, G3, ER+, PR-, HER2-) - Signed by [name], MD on 02/23/2021 Stage prefix: Initial diagnosis Method of lymph node assessment: Core biopsy Histologic grading system: 3 grade system   03/04/2021 -  Chemotherapy    Patient is on Treatment Plan: BREAST ADJUVANT DOSE DENSE AC Q14D / PACLITAXEL Q7D        Genetic Testing   Negative genetic testing. No pathogenic variants identified on the [facility] CancerNext-Expanded+RNA panel. The report date is 03/10/2021.  The CancerNext-Expanded + RNAinsight gene panel offered by [employer] and includes sequencing and rearrangement analysis for the following 77 genes: IP, ALK, APC*, ATM*, AXIN2, BAP1, BARD1, BLM, BMPR1A, BRCA1*, BRCA2*, BRIP1*, CDC73, CDH1*,CDK4, CDKN1B, CDKN2A, CHEK2*, CTNNA1, DICER1, FANCC, FH, FLCN, GALNT12, KIF1B, LZTR1, MAX,  MEN1, MET, MLH1*, MSH2*, MSH3, MSH6*, MUTYH*, NBN, NF1*, NF2, NTHL1, PALB2*, PHOX2B, PMS2*, POT1, PRKAR1A, PTCH1, PTEN*, RAD51C*, RAD51D*,RB1, RECQL, RET, SDHA, SDHAF2, SDHB, SDHC, SDHD, SMAD4, SMARCA4, SMARCB1, SMARCE1, STK11, SUFU, TMEM127, TP53*,TSC1, TSC2, VHL and XRCC2 (sequencing and deletion/duplication); EGFR, EGLN1, HOXB13, KIT, MITF, PDGFRA, POLD1 and POLE (sequencing only); EPCAM and GREM1 (deletion/duplication only).     CHIEF COMPLIANT: Cycle 4 adjuvant chemo with dose dense Adriamycin and Cytoxan  INTERVAL HISTORY: Sandra Bishop is a 50 y.o. with above-mentioned history of invasive ductal carcinoma of the right breast, status post first cycle of chemotherapy with dose dense Adriamycin and Cytoxan. She presents to the clinic today for treatment.  She tolerated the treatment fairly well.  Did not have any nausea or vomiting.  She does today have a slight nausea unclear on etiology.  She is also been feeling very emotional.  She tells me that when she receives the Adriamycin she feels very unsettled.  ALLERGIES:  has No Known Allergies.  MEDICATIONS:  Current Outpatient Medications  Medication Sig Dispense Refill   aspirin EC 81 MG tablet Take 81 mg by mouth daily. Swallow whole.     blood glucose meter kit and supplies Dispense based on patient and insurance preference. Use up to four times daily as directed. (FOR ICD-10 E10.9, E11.9).  ( One-Touch Verio meter) 1 each 0   Cholecalciferol (VITAMIN D3) 50 MCG (2000 UT) TABS Take 2,000 Units by mouth daily.     fluconazole (DIFLUCAN) 100 MG tablet Take 1 tablet (100 mg total) by mouth daily. Take one tablet today 03/25/21 and second tablet on 03/27/21. 2 tablet 0   Lancets (ONETOUCH ULTRASOFT) lancets Use  to check blood glucose three times daily. Use as instructed. E11.9. 100 each 12   lidocaine-prilocaine (EMLA) cream Apply to affected area once 30 g 3   naproxen sodium (ALEVE) 220 MG tablet Take 440 mg by mouth 2 (two) times daily as  needed (pain.).     nystatin-triamcinolone ointment (MYCOLOG) Apply 1 application topically 2 (two) times daily. 30 g 0   ondansetron (ZOFRAN) 8 MG tablet Take 1 tablet (8 mg total) by mouth 2 (two) times daily as needed. Start on the third day after chemotherapy. 30 tablet 1   ONETOUCH VERIO test strip TEST FOUR TIMES DAILY AS DIRECTED 100 strip 2   prochlorperazine (COMPAZINE) 10 MG tablet Take 1 tablet (10 mg total) by mouth every 6 (six) hours as needed (Nausea or vomiting). 30 tablet 1   rosuvastatin (CRESTOR) 5 MG tablet Take 5 mg by mouth every Tuesday.     Semaglutide (RYBELSUS) 3 MG TABS Take 3 mg by mouth daily before breakfast.     traZODone (DESYREL) 50 MG tablet Take 1 tablet (50 mg total) by mouth at bedtime.     Turmeric 450 MG CAPS Take 900 mg by mouth daily.     No current facility-administered medications for this visit.    PHYSICAL EXAMINATION: ECOG PERFORMANCE STATUS: 1 - Symptomatic but completely ambulatory  Vitals:   04/14/21 1036  BP: (!) 160/75  Pulse: 99  Resp: 18  Temp: (!) 97.5 F (36.4 C)  SpO2: 100%   Filed Weights   04/14/21 1036  Weight: 230 lb 12.8 oz (104.7 kg)    LABORATORY DATA:  I have reviewed the data as listed CMP Latest Ref Rng & Units 03/31/2021 03/17/2021 03/10/2021  Glucose 70 - 99 mg/dL 171(H) 202(H) 191(H)  BUN 6 - 20 mg/dL 8 7 9  Creatinine 0.44 - 1.00 mg/dL 0.82 0.81 0.76  Sodium 135 - 145 mmol/L 138 140 137  Potassium 3.5 - 5.1 mmol/L 4.0 3.8 4.1  Chloride 98 - 111 mmol/L 105 106 104  CO2 22 - 32 mmol/L 24 23 25  Calcium 8.9 - 10.3 mg/dL 9.3 8.8(L) 9.0  Total Protein 6.5 - 8.1 g/dL 7.6 7.2 7.3  Total Bilirubin 0.3 - 1.2 mg/dL <0.2(L) <0.2(L) 0.7  Alkaline Phos 38 - 126 U/L 84 78 82  AST 15 - 41 U/L 13(L) 17 10(L)  ALT 0 - 44 U/L 17 25 13    Lab Results  Component Value Date   WBC 8.8 04/14/2021   HGB 8.9 (L) 04/14/2021   HCT 27.2 (L) 04/14/2021   MCV 82.9 04/14/2021   PLT 230 04/14/2021   NEUTROABS PENDING 04/14/2021     ASSESSMENT & PLAN:  Malignant neoplasm of upper-outer quadrant of right breast in female, estrogen receptor positive ([facility]) 02/15/2021:Screening mammogram: a possible mass in the right breast  Diagnostic mammogram and US: a persistent irregular mass 1.2 cm within the upper-outer right breast. 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    Recommendation based on multidisciplinary tumor board: 1. Neoadjuvant chemotherapy with Adriamycin and Cytoxan dose dense 4 followed by Taxol weekly 12  2. Followed by breast conserving surgery with  targeted axillary dissection 3. Followed by adjuvant radiation therapy 4.  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   Chemo Toxicities:  Mild nausea for which she takes nausea medication Constipation Body aches and pains Nausea Hair loss Emotional issues: Patient appears to be very emotional today. Chemotherapy-induced anemia: Monitoring closely today's hemoglobin is 8.9.  Elevated blood sugars: Monitoring closely  We will have to decide in the tumor board whether or not to remove the left-sided ALH. Return to clinic in 2 weeks for  Cycle 1 Taxol.     No orders of the defined types were placed in this encounter.  The patient has a good understanding of the overall plan. she agrees with it. she will call with any problems that may develop before the next visit here.  Total time spent: 30 mins including face to face time and time spent for planning, charting and coordination of care  Vinay K Gudena, MD, MPH 04/14/2021  I, Kirstyn Archbold, am acting as scribe for Dr. Vinay Gudena.  I have reviewed the above documentation for accuracy and completeness, and I agree with the above.       

## 2021-04-14 ENCOUNTER — Telehealth: Payer: Self-pay | Admitting: Hematology and Oncology

## 2021-04-14 ENCOUNTER — Inpatient Hospital Stay (HOSPITAL_BASED_OUTPATIENT_CLINIC_OR_DEPARTMENT_OTHER): Payer: BC Managed Care – PPO | Admitting: Hematology and Oncology

## 2021-04-14 ENCOUNTER — Inpatient Hospital Stay: Payer: BC Managed Care – PPO

## 2021-04-14 ENCOUNTER — Other Ambulatory Visit: Payer: Self-pay

## 2021-04-14 DIAGNOSIS — C50411 Malignant neoplasm of upper-outer quadrant of right female breast: Secondary | ICD-10-CM

## 2021-04-14 DIAGNOSIS — Z170 Estrogen receptor positive status [ER+]: Secondary | ICD-10-CM | POA: Diagnosis not present

## 2021-04-14 DIAGNOSIS — Z5111 Encounter for antineoplastic chemotherapy: Secondary | ICD-10-CM | POA: Diagnosis not present

## 2021-04-14 DIAGNOSIS — Z95828 Presence of other vascular implants and grafts: Secondary | ICD-10-CM

## 2021-04-14 LAB — CBC WITH DIFFERENTIAL/PLATELET
Abs Immature Granulocytes: 1.28 10*3/uL — ABNORMAL HIGH (ref 0.00–0.07)
Basophils Absolute: 0 10*3/uL (ref 0.0–0.1)
Basophils Relative: 0 %
Eosinophils Absolute: 0 10*3/uL (ref 0.0–0.5)
Eosinophils Relative: 0 %
HCT: 27.2 % — ABNORMAL LOW (ref 36.0–46.0)
Hemoglobin: 8.9 g/dL — ABNORMAL LOW (ref 12.0–15.0)
Immature Granulocytes: 15 %
Lymphocytes Relative: 13 %
Lymphs Abs: 1.1 10*3/uL (ref 0.7–4.0)
MCH: 27.1 pg (ref 26.0–34.0)
MCHC: 32.7 g/dL (ref 30.0–36.0)
MCV: 82.9 fL (ref 80.0–100.0)
Monocytes Absolute: 1.5 10*3/uL — ABNORMAL HIGH (ref 0.1–1.0)
Monocytes Relative: 17 %
Neutro Abs: 4.9 10*3/uL (ref 1.7–7.7)
Neutrophils Relative %: 55 %
Platelets: 230 10*3/uL (ref 150–400)
RBC: 3.28 MIL/uL — ABNORMAL LOW (ref 3.87–5.11)
RDW: 18.2 % — ABNORMAL HIGH (ref 11.5–15.5)
WBC: 8.8 10*3/uL (ref 4.0–10.5)
nRBC: 0.2 % (ref 0.0–0.2)

## 2021-04-14 LAB — COMPREHENSIVE METABOLIC PANEL
ALT: 11 U/L (ref 0–44)
AST: 11 U/L — ABNORMAL LOW (ref 15–41)
Albumin: 3.5 g/dL (ref 3.5–5.0)
Alkaline Phosphatase: 79 U/L (ref 38–126)
Anion gap: 10 (ref 5–15)
BUN: 8 mg/dL (ref 6–20)
CO2: 24 mmol/L (ref 22–32)
Calcium: 9.1 mg/dL (ref 8.9–10.3)
Chloride: 106 mmol/L (ref 98–111)
Creatinine, Ser: 0.82 mg/dL (ref 0.44–1.00)
GFR, Estimated: >60 mL/min (ref >60)
Glucose, Bld: 214 mg/dL — ABNORMAL HIGH (ref 70–99)
Potassium: 3.9 mmol/L (ref 3.5–5.1)
Sodium: 140 mmol/L (ref 135–145)
Total Bilirubin: <0.2 mg/dL — ABNORMAL LOW (ref 0.3–1.2)
Total Protein: 7.3 g/dL (ref 6.5–8.1)

## 2021-04-14 MED ORDER — PALONOSETRON HCL INJECTION 0.25 MG/5ML
INTRAVENOUS | Status: AC
  Filled 2021-04-14: qty 5

## 2021-04-14 MED ORDER — DOXORUBICIN HCL CHEMO IV INJECTION 2 MG/ML
60.0000 mg/m2 | Freq: Once | INTRAVENOUS | Status: AC
  Administered 2021-04-14: 130 mg via INTRAVENOUS
  Filled 2021-04-14: qty 65

## 2021-04-14 MED ORDER — PALONOSETRON HCL INJECTION 0.25 MG/5ML
0.2500 mg | Freq: Once | INTRAVENOUS | Status: AC
  Administered 2021-04-14: 0.25 mg via INTRAVENOUS

## 2021-04-14 MED ORDER — HEPARIN SOD (PORK) LOCK FLUSH 100 UNIT/ML IV SOLN
500.0000 [IU] | Freq: Once | INTRAVENOUS | Status: AC | PRN
  Administered 2021-04-14: 500 [IU]

## 2021-04-14 MED ORDER — SODIUM CHLORIDE 0.9 % IV SOLN: Freq: Once | INTRAVENOUS | Status: AC

## 2021-04-14 MED ORDER — SODIUM CHLORIDE 0.9 % IV SOLN
150.0000 mg | Freq: Once | INTRAVENOUS | Status: AC
  Administered 2021-04-14: 150 mg via INTRAVENOUS
  Filled 2021-04-14: qty 150

## 2021-04-14 MED ORDER — SODIUM CHLORIDE 0.9% FLUSH
10.0000 mL | INTRAVENOUS | Status: DC | PRN
  Administered 2021-04-14: 10 mL

## 2021-04-14 MED ORDER — SODIUM CHLORIDE 0.9% FLUSH
10.0000 mL | Freq: Once | INTRAVENOUS | Status: AC
  Administered 2021-04-14: 10 mL

## 2021-04-14 MED ORDER — SODIUM CHLORIDE 0.9 % IV SOLN
600.0000 mg/m2 | Freq: Once | INTRAVENOUS | Status: AC
  Administered 2021-04-14: 1300 mg via INTRAVENOUS
  Filled 2021-04-14: qty 65

## 2021-04-14 NOTE — Patient Instructions (Signed)
[facility] ONCOLOGY  Discharge Instructions: Thank you for choosing [facility] to provide your oncology and hematology care.   If you have a lab appointment with the [facility], please go directly to the [facility] and check in at the registration area.   Wear comfortable clothing and clothing appropriate for easy access to any Portacath or PICC line.   We strive to give you quality time with your provider. You may need to reschedule your appointment if you arrive late (15 or more minutes).  Arriving late affects you and other patients whose appointments are after yours.  Also, if you miss three or more appointments without notifying the office, you may be dismissed from the clinic at the provider's discretion.      For prescription refill requests, have your pharmacy contact our office and allow 72 hours for refills to be completed.    Today you received the following chemotherapy and/or immunotherapy agents Doxorubicin and Cyclophosphamide      To help prevent nausea and vomiting after your treatment, we encourage you to take your nausea medication as directed.  BELOW ARE SYMPTOMS THAT SHOULD BE REPORTED IMMEDIATELY: *FEVER GREATER THAN 100.4 F (38 C) OR HIGHER *CHILLS OR SWEATING *NAUSEA AND VOMITING THAT IS NOT CONTROLLED WITH YOUR NAUSEA MEDICATION *UNUSUAL SHORTNESS OF BREATH *UNUSUAL BRUISING OR BLEEDING *URINARY PROBLEMS (pain or burning when urinating, or frequent urination) *BOWEL PROBLEMS (unusual diarrhea, constipation, pain near the anus) TENDERNESS IN MOUTH AND THROAT WITH OR WITHOUT PRESENCE OF ULCERS (sore throat, sores in mouth, or a toothache) UNUSUAL RASH, SWELLING OR PAIN  UNUSUAL VAGINAL DISCHARGE OR ITCHING   Items with * indicate a potential emergency and should be followed up as soon as possible or go to the Emergency Department if any problems should occur.  Please show the CHEMOTHERAPY ALERT CARD or IMMUNOTHERAPY  ALERT CARD at check-in to the Emergency Department and triage nurse.  Should you have questions after your visit or need to cancel or reschedule your appointment, please contact [facility]  Dept: [phone]  and follow the prompts.  Office hours are 8:00 a.m. to 4:30 p.m. Monday - Friday. Please note that voicemails left after 4:00 p.m. may not be returned until the following business day.  We are closed weekends and major holidays. You have access to a nurse at all times for urgent questions. Please call the main number to the clinic Dept: [phone] and follow the prompts.   For any non-urgent questions, you may also contact your provider using MyChart. We now offer e-Visits for anyone 46 and older to request care online for non-urgent symptoms. For details visit [email or web address].   Also download the MyChart app! Go to the app store, search "MyChart", open the app, select [facility], and log in with your MyChart username and password.  Due to Covid, a mask is required upon entering the hospital/clinic. If you do not have a mask, one will be given to you upon arrival. For doctor visits, patients may have 1 support person aged 62 or older with them. For treatment visits, patients cannot have anyone with them due to current Covid guidelines and our immunocompromised population.

## 2021-04-14 NOTE — Assessment & Plan Note (Signed)
02/15/2021:Screening mammogram: a possible mass in the right breast Diagnostic mammogram and [place]: a persistent irregular mass 1.2 cm within the upper-outer right breast. 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  Recommendationbased on multidisciplinary tumor board: 1. Neoadjuvant chemotherapy with Adriamycin and Cytoxan dose dense 4 followed by Taxol weekly 12  2. Followed by breast conserving surgery with targeted axillary dissection 3. 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  Chemo Toxicities: 1. Mild nausea for which she takes nausea medication 2. Constipation 3. Body aches and pains Elevated blood sugars: Unfortunately she ate lot of high carb foods yesterday.  I reviewed the results of the biopsies.  We will have to decide in the tumor board whether or not to remove the left-sided ALH. Return to clinic in2weeksforCycle1Taxol.

## 2021-04-14 NOTE — Telephone Encounter (Signed)
Scheduled per sch msg. Patient will get printout today

## 2021-04-16 ENCOUNTER — Other Ambulatory Visit: Payer: Self-pay

## 2021-04-16 ENCOUNTER — Inpatient Hospital Stay: Payer: BC Managed Care – PPO | Attending: Hematology and Oncology

## 2021-04-16 VITALS — BP 125/77 | HR 91 | Temp 97.7°F | Resp 16

## 2021-04-16 DIAGNOSIS — Z5111 Encounter for antineoplastic chemotherapy: Secondary | ICD-10-CM | POA: Diagnosis not present

## 2021-04-16 DIAGNOSIS — Z170 Estrogen receptor positive status [ER+]: Secondary | ICD-10-CM | POA: Diagnosis not present

## 2021-04-16 DIAGNOSIS — Z5189 Encounter for other specified aftercare: Secondary | ICD-10-CM | POA: Insufficient documentation

## 2021-04-16 DIAGNOSIS — D6481 Anemia due to antineoplastic chemotherapy: Secondary | ICD-10-CM | POA: Diagnosis not present

## 2021-04-16 DIAGNOSIS — C50411 Malignant neoplasm of upper-outer quadrant of right female breast: Secondary | ICD-10-CM | POA: Insufficient documentation

## 2021-04-16 MED ORDER — PEGFILGRASTIM-CBQV 6 MG/0.6ML ~~LOC~~ SOSY
6.0000 mg | PREFILLED_SYRINGE | Freq: Once | SUBCUTANEOUS | Status: AC
  Administered 2021-04-16: 6 mg via SUBCUTANEOUS

## 2021-04-16 MED ORDER — PEGFILGRASTIM-CBQV 6 MG/0.6ML ~~LOC~~ SOSY
PREFILLED_SYRINGE | SUBCUTANEOUS | Status: AC
  Filled 2021-04-16: qty 0.6

## 2021-04-20 ENCOUNTER — Encounter: Payer: Self-pay | Admitting: *Deleted

## 2021-04-22 ENCOUNTER — Other Ambulatory Visit: Payer: Self-pay | Admitting: *Deleted

## 2021-04-22 DIAGNOSIS — C50411 Malignant neoplasm of upper-outer quadrant of right female breast: Secondary | ICD-10-CM

## 2021-04-22 DIAGNOSIS — Z170 Estrogen receptor positive status [ER+]: Secondary | ICD-10-CM

## 2021-04-22 MED ORDER — DICYCLOMINE HCL 10 MG PO CAPS: 10.0000 mg | ORAL_CAPSULE | Freq: Three times a day (TID) | ORAL | 0 refills | Status: DC

## 2021-04-22 MED ORDER — ONDANSETRON 8 MG PO TBDP: 8.0000 mg | ORAL_TABLET | Freq: Three times a day (TID) | ORAL | 0 refills | Status: DC | PRN

## 2021-04-22 NOTE — Progress Notes (Signed)
Received call from pt with complaint of constant stomach cramping, nausea and vomiting not alleviated by antiemetics.  Pt states she is unable to eat and drink due to symptoms.  Pt denies headache or dizziness.  Per MD pt to be prescribed Bentyl 10 mg 3 times daily with meals and before bed for abdominal spasms and Zofran ODT q 8 hrs PRN to alleviate nausea.  MD also suggests pt f/u with Wilber Bihari NP on Monday with lab work to assess dehydration.  Prescriptions sent to pharmacy on file.  Pt educated to go to the emergency room if symptoms worsen over the weekend.  Pt verbalized understanding.

## 2021-04-25 ENCOUNTER — Encounter: Payer: BC Managed Care – PPO | Admitting: Adult Health

## 2021-04-25 ENCOUNTER — Other Ambulatory Visit: Payer: BC Managed Care – PPO

## 2021-04-25 ENCOUNTER — Telehealth: Payer: Self-pay | Admitting: Hematology and Oncology

## 2021-04-25 NOTE — Telephone Encounter (Signed)
Per 10/10 sch msg, appt was cancelled, attempted to call pt, no answer

## 2021-04-27 NOTE — Progress Notes (Signed)
Patient Care Team: [name], MD as PCP - General (Family Medicine) [name], RN as Oncology Nurse Navigator [name], RN as Oncology Nurse Navigator [name], MD as Consulting Physician (General Surgery) [name], MD as Consulting Physician (Hematology and Oncology) [name], MD as Consulting Physician (Radiation Oncology)  DIAGNOSIS:    ICD-10-CM   1. Malignant neoplasm of upper-outer quadrant of right breast in female, estrogen receptor positive ([place])  C50.411    Z17.0       SUMMARY OF ONCOLOGIC HISTORY: Oncology History  Malignant neoplasm of upper-outer quadrant of right breast in female, estrogen receptor positive ([place])  02/15/2021 Initial Diagnosis   Screening mammogram: a possible mass in the right breast  Diagnostic mammogram and [place]: a persistent irregular mass 1.2 cm within the upper-outer right breast. 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    02/23/2021 Cancer Staging   Staging form: Breast, AJCC 8th Edition - Clinical stage from 02/23/2021: Stage IIB (cT1c, cN1(f), cM0, G3, ER+, PR-, HER2-) - Signed by [name], MD on 02/23/2021 Stage prefix: Initial diagnosis Method of lymph node assessment: Core biopsy Histologic grading system: 3 grade system   03/04/2021 -  Chemotherapy   Patient is on Treatment Plan : BREAST ADJUVANT DOSE DENSE AC q14d / PACLitaxel q7d      Genetic Testing   Negative genetic testing. No pathogenic variants identified on the [facility] CancerNext-Expanded+RNA panel. The report date is 03/10/2021.  The CancerNext-Expanded + RNAinsight gene panel offered by [employer] and includes sequencing and rearrangement analysis for the following 77 genes: IP, ALK, APC*, ATM*, AXIN2, BAP1, BARD1, BLM, BMPR1A, BRCA1*, BRCA2*, BRIP1*, CDC73, CDH1*,CDK4, CDKN1B, CDKN2A, CHEK2*, CTNNA1, DICER1, FANCC, FH, FLCN, GALNT12, KIF1B, LZTR1, MAX, MEN1,  MET, MLH1*, MSH2*, MSH3, MSH6*, MUTYH*, NBN, NF1*, NF2, NTHL1, PALB2*, PHOX2B, PMS2*, POT1, PRKAR1A, PTCH1, PTEN*, RAD51C*, RAD51D*,RB1, RECQL, RET, SDHA, SDHAF2, SDHB, SDHC, SDHD, SMAD4, SMARCA4, SMARCB1, SMARCE1, STK11, SUFU, TMEM127, TP53*,TSC1, TSC2, VHL and XRCC2 (sequencing and deletion/duplication); EGFR, EGLN1, HOXB13, KIT, MITF, PDGFRA, POLD1 and POLE (sequencing only); EPCAM and GREM1 (deletion/duplication only).     CHIEF COMPLIANT: Cycle 1 Taxol  INTERVAL HISTORY: Sandra Bishop is a 50 y.o. with above-mentioned history of invasive ductal carcinoma of the right breast having undergone chemotherapy with dose dense Adriamycin and Cytoxan, currently on chemotherapy with Taxol. She presents to the clinic today for treatment.  She has tolerated chemotherapy reasonably well with exception of poor taste because of that she is not able to drink water.  She has been drinking at [place] lemonade a whole bottle of it every day.  Her sugars are running higher because of that.  Denies any nausea or vomiting denies neuropathy.  The hand discoloration is also bothering her.  ALLERGIES:  has No Known Allergies.  MEDICATIONS:  Current Outpatient Medications  Medication Sig Dispense Refill   aspirin EC 81 MG tablet Take 81 mg by mouth daily. Swallow whole.     blood glucose meter kit and supplies Dispense based on patient and insurance preference. Use up to four times daily as directed. (FOR ICD-10 E10.9, E11.9).  ( One-Touch Verio meter) 1 each 0   Cholecalciferol (VITAMIN D3) 50 MCG (2000 UT) TABS Take 2,000 Units by mouth daily.     dicyclomine (BENTYL) 10 MG capsule Take 1 capsule (10 mg total) by mouth 4 (four) times daily -  before meals and at bedtime. 14 capsule 0  fluconazole (DIFLUCAN) 100 MG tablet Take 1 tablet (100 mg total) by mouth daily. Take one tablet today 03/25/21 and second tablet on 03/27/21. 2 tablet 0   Lancets (ONETOUCH ULTRASOFT) lancets Use to check blood glucose three  times daily. Use as instructed. E11.9. 100 each 12   lidocaine-prilocaine (EMLA) cream Apply to affected area once 30 g 3   naproxen sodium (ALEVE) 220 MG tablet Take 440 mg by mouth 2 (two) times daily as needed (pain.).     nystatin-triamcinolone ointment (MYCOLOG) Apply 1 application topically 2 (two) times daily. 30 g 0   ondansetron (ZOFRAN ODT) 8 MG disintegrating tablet Take 1 tablet (8 mg total) by mouth every 8 (eight) hours as needed for nausea or vomiting. 20 tablet 0   ondansetron (ZOFRAN) 8 MG tablet Take 1 tablet (8 mg total) by mouth 2 (two) times daily as needed. Start on the third day after chemotherapy. 30 tablet 1   ONETOUCH VERIO test strip TEST FOUR TIMES DAILY AS DIRECTED 100 strip 2   prochlorperazine (COMPAZINE) 10 MG tablet Take 1 tablet (10 mg total) by mouth every 6 (six) hours as needed (Nausea or vomiting). 30 tablet 1   rosuvastatin (CRESTOR) 5 MG tablet Take 5 mg by mouth every Tuesday.     Semaglutide (RYBELSUS) 3 MG TABS Take 3 mg by mouth daily before breakfast.     traZODone (DESYREL) 50 MG tablet Take 1 tablet (50 mg total) by mouth at bedtime.     Turmeric 450 MG CAPS Take 900 mg by mouth daily.     No current facility-administered medications for this visit.    PHYSICAL EXAMINATION: ECOG PERFORMANCE STATUS: 1 - Symptomatic but completely ambulatory  Vitals:   04/28/21 0835  BP: (!) 154/86  Pulse: (!) 104  Resp: 19  Temp: 97.9 F (36.6 C)  SpO2: 100%   Filed Weights   04/28/21 0835  Weight: 226 lb 14.4 oz (102.9 kg)     LABORATORY DATA:  I have reviewed the data as listed CMP Latest Ref Rng & Units 04/14/2021 03/31/2021 03/17/2021  Glucose 70 - 99 mg/dL 214(H) 171(H) 202(H)  BUN 6 - 20 mg/dL '8 8 7  ' Creatinine 0.44 - 1.00 mg/dL 0.82 0.82 0.81  Sodium 135 - 145 mmol/L 140 138 140  Potassium 3.5 - 5.1 mmol/L 3.9 4.0 3.8  Chloride 98 - 111 mmol/L 106 105 106  CO2 22 - 32 mmol/L '24 24 23  ' Calcium 8.9 - 10.3 mg/dL 9.1 9.3 8.8(L)  Total Protein  6.5 - 8.1 g/dL 7.3 7.6 7.2  Total Bilirubin 0.3 - 1.2 mg/dL <0.2(L) <0.2(L) <0.2(L)  Alkaline Phos 38 - 126 U/L 79 84 78  AST 15 - 41 U/L 11(L) 13(L) 17  ALT 0 - 44 U/L '11 17 25    ' Lab Results  Component Value Date   WBC 6.4 04/28/2021   HGB 8.4 (L) 04/28/2021   HCT 26.0 (L) 04/28/2021   MCV 85.5 04/28/2021   PLT 135 (L) 04/28/2021   NEUTROABS 3.6 04/28/2021    ASSESSMENT & PLAN:  Malignant neoplasm of upper-outer quadrant of right breast in female, estrogen receptor positive ([place]) 02/15/2021:Screening mammogram: a possible mass in the right breast  Diagnostic mammogram and [place]: a persistent irregular mass 1.2 cm within the upper-outer right breast. 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    Recommendation based on multidisciplinary tumor board: 1. Neoadjuvant chemotherapy with Adriamycin and Cytoxan  dose dense 4 followed by Taxol weekly 12  2. Followed by breast conserving surgery with  targeted axillary dissection 3. Followed by adjuvant radiation therapy 4.  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   Chemo Toxicities:   Nausea  chemotherapy-induced anemia: Monitoring closely today's hemoglobin is 8.4. Abdominal cramps: Resolved Elevated blood sugars: Monitoring closely, discontinued Decadron   We will have to decide in the tumor board whether or not to remove the left-sided ALH. She does not want to receive treatment on the week of Thanksgiving. Return to clinic in 1 week for cycle 2 of Taxol    No orders of the defined types were placed in this encounter.  The patient has a good understanding of the overall plan. she agrees with it. she will call with any problems that may develop before the next visit here.  Total time spent: 30 mins including face to face time and time spent for planning, charting and coordination of care  Rulon Eisenmenger, MD, MPH 04/28/2021  I, Thana Ates, am acting as scribe for Dr. [name].  I have reviewed the above documentation for accuracy and completeness, and I agree with the above.

## 2021-04-28 ENCOUNTER — Other Ambulatory Visit: Payer: Self-pay

## 2021-04-28 ENCOUNTER — Inpatient Hospital Stay: Payer: BC Managed Care – PPO

## 2021-04-28 ENCOUNTER — Inpatient Hospital Stay (HOSPITAL_BASED_OUTPATIENT_CLINIC_OR_DEPARTMENT_OTHER): Payer: BC Managed Care – PPO | Admitting: Hematology and Oncology

## 2021-04-28 VITALS — BP 142/90 | HR 79 | Temp 98.2°F | Resp 18

## 2021-04-28 DIAGNOSIS — C50411 Malignant neoplasm of upper-outer quadrant of right female breast: Secondary | ICD-10-CM

## 2021-04-28 DIAGNOSIS — Z95828 Presence of other vascular implants and grafts: Secondary | ICD-10-CM

## 2021-04-28 DIAGNOSIS — Z170 Estrogen receptor positive status [ER+]: Secondary | ICD-10-CM

## 2021-04-28 DIAGNOSIS — Z5111 Encounter for antineoplastic chemotherapy: Secondary | ICD-10-CM | POA: Diagnosis not present

## 2021-04-28 LAB — CBC WITH DIFFERENTIAL/PLATELET
Abs Immature Granulocytes: 0.53 10*3/uL — ABNORMAL HIGH (ref 0.00–0.07)
Basophils Absolute: 0 10*3/uL (ref 0.0–0.1)
Basophils Relative: 0 %
Eosinophils Absolute: 0 10*3/uL (ref 0.0–0.5)
Eosinophils Relative: 0 %
HCT: 26 % — ABNORMAL LOW (ref 36.0–46.0)
Hemoglobin: 8.4 g/dL — ABNORMAL LOW (ref 12.0–15.0)
Immature Granulocytes: 8 %
Lymphocytes Relative: 15 %
Lymphs Abs: 0.9 10*3/uL (ref 0.7–4.0)
MCH: 27.6 pg (ref 26.0–34.0)
MCHC: 32.3 g/dL (ref 30.0–36.0)
MCV: 85.5 fL (ref 80.0–100.0)
Monocytes Absolute: 1.3 10*3/uL — ABNORMAL HIGH (ref 0.1–1.0)
Monocytes Relative: 21 %
Neutro Abs: 3.6 10*3/uL (ref 1.7–7.7)
Neutrophils Relative %: 56 %
Platelets: 135 10*3/uL — ABNORMAL LOW (ref 150–400)
RBC: 3.04 MIL/uL — ABNORMAL LOW (ref 3.87–5.11)
RDW: 21.5 % — ABNORMAL HIGH (ref 11.5–15.5)
WBC: 6.4 10*3/uL (ref 4.0–10.5)
nRBC: 0.5 % — ABNORMAL HIGH (ref 0.0–0.2)

## 2021-04-28 LAB — CMP (CANCER CENTER ONLY)
ALT: 14 U/L (ref 0–44)
AST: 14 U/L — ABNORMAL LOW (ref 15–41)
Albumin: 3.7 g/dL (ref 3.5–5.0)
Alkaline Phosphatase: 69 U/L (ref 38–126)
Anion gap: 6 (ref 5–15)
BUN: 10 mg/dL (ref 6–20)
CO2: 28 mmol/L (ref 22–32)
Calcium: 8.9 mg/dL (ref 8.9–10.3)
Chloride: 104 mmol/L (ref 98–111)
Creatinine: 0.81 mg/dL (ref 0.44–1.00)
GFR, Estimated: >60 mL/min (ref >60)
Glucose, Bld: 178 mg/dL — ABNORMAL HIGH (ref 70–99)
Potassium: 3.7 mmol/L (ref 3.5–5.1)
Sodium: 138 mmol/L (ref 135–145)
Total Bilirubin: 0.3 mg/dL (ref 0.3–1.2)
Total Protein: 7.3 g/dL (ref 6.5–8.1)

## 2021-04-28 LAB — MAGNESIUM: Magnesium: 1.8 mg/dL (ref 1.7–2.4)

## 2021-04-28 MED ORDER — SODIUM CHLORIDE 0.9 % IV SOLN
80.0000 mg/m2 | Freq: Once | INTRAVENOUS | Status: AC
  Administered 2021-04-28: 174 mg via INTRAVENOUS
  Filled 2021-04-28: qty 29

## 2021-04-28 MED ORDER — DIPHENHYDRAMINE HCL 50 MG/ML IJ SOLN
25.0000 mg | Freq: Once | INTRAMUSCULAR | Status: AC
  Administered 2021-04-28: 25 mg via INTRAVENOUS
  Filled 2021-04-28: qty 1

## 2021-04-28 MED ORDER — SODIUM CHLORIDE 0.9 % IV SOLN: Freq: Once | INTRAVENOUS | Status: AC

## 2021-04-28 MED ORDER — HEPARIN SOD (PORK) LOCK FLUSH 100 UNIT/ML IV SOLN
500.0000 [IU] | Freq: Once | INTRAVENOUS | Status: AC | PRN
  Administered 2021-04-28: 500 [IU]

## 2021-04-28 MED ORDER — SODIUM CHLORIDE 0.9 % IV SOLN: 8.0000 mg | Freq: Once | INTRAVENOUS | Status: DC

## 2021-04-28 MED ORDER — SODIUM CHLORIDE 0.9% FLUSH
10.0000 mL | Freq: Once | INTRAVENOUS | Status: AC
  Administered 2021-04-28: 10 mL

## 2021-04-28 MED ORDER — SODIUM CHLORIDE 0.9% FLUSH
10.0000 mL | INTRAVENOUS | Status: DC | PRN
  Administered 2021-04-28: 10 mL

## 2021-04-28 MED ORDER — FAMOTIDINE 20 MG IN NS 100 ML IVPB
20.0000 mg | Freq: Once | INTRAVENOUS | Status: AC
Start: 2021-04-28 — End: 2021-04-28
  Administered 2021-04-28: 20 mg via INTRAVENOUS
  Filled 2021-04-28: qty 100

## 2021-04-28 MED ORDER — ONDANSETRON HCL 4 MG/2ML IJ SOLN
8.0000 mg | Freq: Once | INTRAMUSCULAR | Status: AC
  Administered 2021-04-28: 8 mg via INTRAVENOUS
  Filled 2021-04-28: qty 4

## 2021-04-28 NOTE — Patient Instructions (Signed)
[facility]  Discharge Instructions: Thank you for choosing [facility] to provide your oncology and hematology care.   If you have a lab appointment with the [facility], please go directly to the [facility] and check in at the registration area.   Wear comfortable clothing and clothing appropriate for easy access to any Portacath or PICC line.   We strive to give you quality time with your provider. You may need to reschedule your appointment if you arrive late (15 or more minutes).  Arriving late affects you and other patients whose appointments are after yours.  Also, if you miss three or more appointments without notifying the office, you may be dismissed from the clinic at the provider's discretion.      For prescription refill requests, have your pharmacy contact our office and allow 72 hours for refills to be completed.    Today you received the following chemotherapy and/or immunotherapy agents Paclitaxel (Taxol)   To help prevent nausea and vomiting after your treatment, we encourage you to take your nausea medication as directed.  BELOW ARE SYMPTOMS THAT SHOULD BE REPORTED IMMEDIATELY: *FEVER GREATER THAN 100.4 F (38 C) OR HIGHER *CHILLS OR SWEATING *NAUSEA AND VOMITING THAT IS NOT CONTROLLED WITH YOUR NAUSEA MEDICATION *UNUSUAL SHORTNESS OF BREATH *UNUSUAL BRUISING OR BLEEDING *URINARY PROBLEMS (pain or burning when urinating, or frequent urination) *BOWEL PROBLEMS (unusual diarrhea, constipation, pain near the anus) TENDERNESS IN MOUTH AND THROAT WITH OR WITHOUT PRESENCE OF ULCERS (sore throat, sores in mouth, or a toothache) UNUSUAL RASH, SWELLING OR PAIN  UNUSUAL VAGINAL DISCHARGE OR ITCHING   Items with * indicate a potential emergency and should be followed up as soon as possible or go to the Emergency Department if any problems should occur.  Please show the CHEMOTHERAPY ALERT CARD or IMMUNOTHERAPY ALERT CARD at  check-in to the Emergency Department and triage nurse.  Should you have questions after your visit or need to cancel or reschedule your appointment, please contact [facility]  Dept: [phone]  and follow the prompts.  Office hours are 8:00 a.m. to 4:30 p.m. Monday - Friday. Please note that voicemails left after 4:00 p.m. may not be returned until the following business day.  We are closed weekends and major holidays. You have access to a nurse at all times for urgent questions. Please call the main number to the clinic Dept: [phone] and follow the prompts.   For any non-urgent questions, you may also contact your provider using MyChart. We now offer e-Visits for anyone 21 and older to request care online for non-urgent symptoms. For details visit [email or web address].   Also download the MyChart app! Go to the app store, search "MyChart", open the app, select [facility], and log in with your MyChart username and password.  Due to Covid, a mask is required upon entering the hospital/clinic. If you do not have a mask, one will be given to you upon arrival. For doctor visits, patients may have 1 support person aged 42 or older with them. For treatment visits, patients cannot have anyone with them due to current Covid guidelines and our immunocompromised population.   Paclitaxel injection What is this medication? PACLITAXEL (PAK li TAX el) is a chemotherapy drug. It targets fast dividing cells, like cancer cells, and causes these cells to die. This medicine is used to treat ovarian cancer, breast cancer, lung cancer, Kaposi's sarcoma, and other cancers. This medicine may be used  for other purposes; ask your health care provider or pharmacist if you have questions. COMMON BRAND NAME(S): Onxol, Taxol What should I tell my care team before I take this medication? They need to know if you have any of these conditions: history of irregular heartbeat liver  disease low blood counts, like low white cell, platelet, or red cell counts lung or breathing disease, like asthma tingling of the fingers or toes, or other nerve disorder an unusual or allergic reaction to paclitaxel, alcohol, polyoxyethylated castor oil, other chemotherapy, other medicines, foods, dyes, or preservatives pregnant or trying to get pregnant breast-feeding How should I use this medication? This drug is given as an infusion into a vein. It is administered in a hospital or clinic by a specially trained health care professional. Talk to your pediatrician regarding the use of this medicine in children. Special care may be needed. Overdosage: If you think you have taken too much of this medicine contact a poison control center or emergency room at once. NOTE: This medicine is only for you. Do not share this medicine with others. What if I miss a dose? It is important not to miss your dose. Call your doctor or health care professional if you are unable to keep an appointment. What may interact with this medication? Do not take this medicine with any of the following medications: live virus vaccines This medicine may also interact with the following medications: antiviral medicines for hepatitis, HIV or AIDS certain antibiotics like erythromycin and clarithromycin certain medicines for fungal infections like ketoconazole and itraconazole certain medicines for seizures like carbamazepine, phenobarbital, phenytoin gemfibrozil nefazodone rifampin St. John's wort This list may not describe all possible interactions. Give your health care provider a list of all the medicines, herbs, non-prescription drugs, or dietary supplements you use. Also tell them if you smoke, drink alcohol, or use illegal drugs. Some items may interact with your medicine. What should I watch for while using this medication? Your condition will be monitored carefully while you are receiving this medicine. You  will need important blood work done while you are taking this medicine. This medicine can cause serious allergic reactions. To reduce your risk you will need to take other medicine(s) before treatment with this medicine. If you experience allergic reactions like skin rash, itching or hives, swelling of the face, lips, or tongue, tell your doctor or health care professional right away. In some cases, you may be given additional medicines to help with side effects. Follow all directions for their use. This drug may make you feel generally unwell. This is not uncommon, as chemotherapy can affect healthy cells as well as cancer cells. Report any side effects. Continue your course of treatment even though you feel ill unless your doctor tells you to stop. Call your doctor or health care professional for advice if you get a fever, chills or sore throat, or other symptoms of a cold or flu. Do not treat yourself. This drug decreases your body's ability to fight infections. Try to avoid being around people who are sick. This medicine may increase your risk to bruise or bleed. Call your doctor or health care professional if you notice any unusual bleeding. Be careful brushing and flossing your teeth or using a toothpick because you may get an infection or bleed more easily. If you have any dental work done, tell your dentist you are receiving this medicine. Avoid taking products that contain aspirin, acetaminophen, ibuprofen, naproxen, or ketoprofen unless instructed by your doctor. These  medicines may hide a fever. Do not become pregnant while taking this medicine. Women should inform their doctor if they wish to become pregnant or think they might be pregnant. There is a potential for serious side effects to an unborn child. Talk to your health care professional or pharmacist for more information. Do not breast-feed an infant while taking this medicine. Men are advised not to father a child while receiving this  medicine. This product may contain alcohol. Ask your pharmacist or healthcare provider if this medicine contains alcohol. Be sure to tell all healthcare providers you are taking this medicine. Certain medicines, like metronidazole and disulfiram, can cause an unpleasant reaction when taken with alcohol. The reaction includes flushing, headache, nausea, vomiting, sweating, and increased thirst. The reaction can last from 30 minutes to several hours. What side effects may I notice from receiving this medication? Side effects that you should report to your doctor or health care professional as soon as possible: allergic reactions like skin rash, itching or hives, swelling of the face, lips, or tongue breathing problems changes in vision fast, irregular heartbeat high or low blood pressure mouth sores pain, tingling, numbness in the hands or feet signs of decreased platelets or bleeding - bruising, pinpoint red spots on the skin, black, tarry stools, blood in the urine signs of decreased red blood cells - unusually weak or tired, feeling faint or lightheaded, falls signs of infection - fever or chills, cough, sore throat, pain or difficulty passing urine signs and symptoms of liver injury like dark yellow or brown urine; general ill feeling or flu-like symptoms; light-colored stools; loss of appetite; nausea; right upper belly pain; unusually weak or tired; yellowing of the eyes or skin swelling of the ankles, feet, hands unusually slow heartbeat Side effects that usually do not require medical attention (report to your doctor or health care professional if they continue or are bothersome): diarrhea hair loss loss of appetite muscle or joint pain nausea, vomiting pain, redness, or irritation at site where injected tiredness This list may not describe all possible side effects. Call your doctor for medical advice about side effects. You may report side effects to FDA at 1-800-FDA-1088. Where  should I keep my medication? This drug is given in a hospital or clinic and will not be stored at home. NOTE: This sheet is a summary. It may not cover all possible information. If you have questions about this medicine, talk to your doctor, pharmacist, or health care provider.  2022 Elsevier/Gold Standard (2019-06-04 13:37:23)

## 2021-04-28 NOTE — Assessment & Plan Note (Signed)
02/15/2021:Screening mammogram: a possible mass in the right breast Diagnostic mammogram and [place]: a persistent irregular mass 1.2 cm within the upper-outer right breast. 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  Recommendationbased on multidisciplinary tumor board: 1. Neoadjuvant chemotherapy with Adriamycin and Cytoxan dose dense 4 followed by Taxol weekly 12  2. Followed by breast conserving surgery with targeted axillary dissection 3. 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  Chemo Toxicities: 1. Nausea  2. chemotherapy-induced anemia: Monitoring closely today's hemoglobin is 8.9. 3. Abdominal cramps: Sent prescription for Bentyl Elevated blood sugars:Monitoring closely  We will have to decide in the tumor board whether or not to remove the left-sided ALH. Return to clinic in1 week for cycle 2 of Taxol

## 2021-04-29 ENCOUNTER — Telehealth: Payer: Self-pay | Admitting: *Deleted

## 2021-04-29 NOTE — Telephone Encounter (Signed)
RN contact pt to f/u on how she is feeling after receiving first dose of IV taxol yesterday.  Pt states she feels great, no complaints.  RN reminded pt to increase oral fluid intake, take prescription nausea medication as needed and to call the office with any complaints.  Pt verbalized understanding.

## 2021-05-03 ENCOUNTER — Telehealth: Payer: Self-pay | Admitting: *Deleted

## 2021-05-03 ENCOUNTER — Other Ambulatory Visit: Payer: Self-pay | Admitting: Hematology and Oncology

## 2021-05-03 NOTE — Telephone Encounter (Signed)
Received call from pt with complaint of cramping in bilateral legs as well as aching in bilateral hips.  Pt states cramping and aches occur at night while laying in bed.  Per MD pt to take Ibuprofen as directed as well as increasing fluid intake and stretching over the next 24 hours to see if symptoms resolve. Pt scheduled for weekly tx this week and will f.u with MD to discuss symptoms.  Pt verbalized understanding and appreciative of advice.

## 2021-05-04 NOTE — Progress Notes (Signed)
Patient Care Team: [name], MD as PCP - General (Family Medicine) [name], RN as Oncology Nurse Navigator [name], RN as Oncology Nurse Navigator [name], MD as Consulting Physician (General Surgery) [name], MD as Consulting Physician (Hematology and Oncology) [name], MD as Consulting Physician (Radiation Oncology)  DIAGNOSIS:    ICD-10-CM   1. Malignant neoplasm of upper-outer quadrant of right breast in female, estrogen receptor positive ([place])  C50.411    Z17.0       SUMMARY OF ONCOLOGIC HISTORY: Oncology History  Malignant neoplasm of upper-outer quadrant of right breast in female, estrogen receptor positive ([place])  02/15/2021 Initial Diagnosis   Screening mammogram: a possible mass in the right breast  Diagnostic mammogram and [place]: a persistent irregular mass 1.2 cm within the upper-outer right breast. 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    02/23/2021 Cancer Staging   Staging form: Breast, AJCC 8th Edition - Clinical stage from 02/23/2021: Stage IIB (cT1c, cN1(f), cM0, G3, ER+, PR-, HER2-) - Signed by [name], MD on 02/23/2021 Stage prefix: Initial diagnosis Method of lymph node assessment: Core biopsy Histologic grading system: 3 grade system   03/04/2021 -  Chemotherapy   Patient is on Treatment Plan : BREAST ADJUVANT DOSE DENSE AC q14d / PACLitaxel q7d      Genetic Testing   Negative genetic testing. No pathogenic variants identified on the [facility] CancerNext-Expanded+RNA panel. The report date is 03/10/2021.  The CancerNext-Expanded + RNAinsight gene panel offered by [employer] and includes sequencing and rearrangement analysis for the following 77 genes: IP, ALK, APC*, ATM*, AXIN2, BAP1, BARD1, BLM, BMPR1A, BRCA1*, BRCA2*, BRIP1*, CDC73, CDH1*,CDK4, CDKN1B, CDKN2A, CHEK2*, CTNNA1, DICER1, FANCC, FH, FLCN, GALNT12, KIF1B, LZTR1, MAX, MEN1,  MET, MLH1*, MSH2*, MSH3, MSH6*, MUTYH*, NBN, NF1*, NF2, NTHL1, PALB2*, PHOX2B, PMS2*, POT1, PRKAR1A, PTCH1, PTEN*, RAD51C*, RAD51D*,RB1, RECQL, RET, SDHA, SDHAF2, SDHB, SDHC, SDHD, SMAD4, SMARCA4, SMARCB1, SMARCE1, STK11, SUFU, TMEM127, TP53*,TSC1, TSC2, VHL and XRCC2 (sequencing and deletion/duplication); EGFR, EGLN1, HOXB13, KIT, MITF, PDGFRA, POLD1 and POLE (sequencing only); EPCAM and GREM1 (deletion/duplication only).     CHIEF COMPLIANT: Cycle 2 Taxol  INTERVAL HISTORY: Sandra Bishop is a 50 y.o. with above-mentioned history of invasive ductal carcinoma of the right breast having undergone chemotherapy with dose dense Adriamycin and Cytoxan, currently on chemotherapy with Taxol. She presents to the clinic today for treatment.  She complained of muscle cramps after the last cycle of chemo especially in her legs.  It felt like more like achiness rather than cramps.  She has not been drinking enough water.  Did not have any other side effects with Taxol.  ALLERGIES:  has No Known Allergies.  MEDICATIONS:  Current Outpatient Medications  Medication Sig Dispense Refill   aspirin EC 81 MG tablet Take 81 mg by mouth daily. Swallow whole.     blood glucose meter kit and supplies Dispense based on patient and insurance preference. Use up to four times daily as directed. (FOR ICD-10 E10.9, E11.9).  ( One-Touch Verio meter) 1 each 0   Cholecalciferol (VITAMIN D3) 50 MCG (2000 UT) TABS Take 2,000 Units by mouth daily.     dicyclomine (BENTYL) 10 MG capsule TAKE 1 CAPSULE(10 MG) BY MOUTH FOUR TIMES DAILY BEFORE MEALS AND AT BEDTIME 14 capsule 0   fluconazole (DIFLUCAN) 100 MG tablet Take 1 tablet (100 mg total) by mouth daily. Take one tablet today 03/25/21 and second  tablet on 03/27/21. 2 tablet 0   Lancets (ONETOUCH ULTRASOFT) lancets Use to check blood glucose three times daily. Use as instructed. E11.9. 100 each 12   lidocaine-prilocaine (EMLA) cream Apply to affected area once 30 g 3   naproxen  sodium (ALEVE) 220 MG tablet Take 440 mg by mouth 2 (two) times daily as needed (pain.).     nystatin-triamcinolone ointment (MYCOLOG) Apply 1 application topically 2 (two) times daily. 30 g 0   ondansetron (ZOFRAN ODT) 8 MG disintegrating tablet Take 1 tablet (8 mg total) by mouth every 8 (eight) hours as needed for nausea or vomiting. 20 tablet 0   ondansetron (ZOFRAN) 8 MG tablet Take 1 tablet (8 mg total) by mouth 2 (two) times daily as needed. Start on the third day after chemotherapy. 30 tablet 1   ONETOUCH VERIO test strip TEST FOUR TIMES DAILY AS DIRECTED 100 strip 2   prochlorperazine (COMPAZINE) 10 MG tablet Take 1 tablet (10 mg total) by mouth every 6 (six) hours as needed (Nausea or vomiting). 30 tablet 1   rosuvastatin (CRESTOR) 5 MG tablet Take 5 mg by mouth every Tuesday.     Semaglutide (RYBELSUS) 3 MG TABS Take 3 mg by mouth daily before breakfast.     traZODone (DESYREL) 50 MG tablet Take 1 tablet (50 mg total) by mouth at bedtime.     Turmeric 450 MG CAPS Take 900 mg by mouth daily.     No current facility-administered medications for this visit.    PHYSICAL EXAMINATION: ECOG PERFORMANCE STATUS: 1 - Symptomatic but completely ambulatory  Vitals:   05/05/21 0830  BP: (!) 148/88  Pulse: 99  Resp: 18  Temp: 97.9 F (36.6 C)  SpO2: 100%   Filed Weights   05/05/21 0830  Weight: 229 lb 11.2 oz (104.2 kg)    LABORATORY DATA:  I have reviewed the data as listed CMP Latest Ref Rng & Units 04/28/2021 04/14/2021 03/31/2021  Glucose 70 - 99 mg/dL 178(H) 214(H) 171(H)  BUN 6 - 20 mg/dL '10 8 8  ' Creatinine 0.44 - 1.00 mg/dL 0.81 0.82 0.82  Sodium 135 - 145 mmol/L 138 140 138  Potassium 3.5 - 5.1 mmol/L 3.7 3.9 4.0  Chloride 98 - 111 mmol/L 104 106 105  CO2 22 - 32 mmol/L '28 24 24  ' Calcium 8.9 - 10.3 mg/dL 8.9 9.1 9.3  Total Protein 6.5 - 8.1 g/dL 7.3 7.3 7.6  Total Bilirubin 0.3 - 1.2 mg/dL 0.3 <0.2(L) <0.2(L)  Alkaline Phos 38 - 126 U/L 69 79 84  AST 15 - 41 U/L 14(L)  11(L) 13(L)  ALT 0 - 44 U/L '14 11 17    ' Lab Results  Component Value Date   WBC 6.4 04/28/2021   HGB 8.4 (L) 04/28/2021   HCT 26.0 (L) 04/28/2021   MCV 85.5 04/28/2021   PLT 135 (L) 04/28/2021   NEUTROABS 3.6 04/28/2021    ASSESSMENT & PLAN:  Malignant neoplasm of upper-outer quadrant of right breast in female, estrogen receptor positive ([place]) 02/15/2021:Screening mammogram: a possible mass in the right breast  Diagnostic mammogram and [place]: a persistent irregular mass 1.2 cm within the upper-outer right breast. 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    Recommendation based on multidisciplinary tumor board: 1. Neoadjuvant chemotherapy with Adriamycin and Cytoxan dose dense 4 followed by Taxol weekly 12  2. Followed by breast conserving surgery with  targeted axillary dissection 3. Followed by  adjuvant radiation therapy 4.  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   Chemo Toxicities:  Nausea  chemotherapy-induced anemia: Monitoring closely today's hemoglobin is 8.4.  Stable Abdominal cramps: Takes Bentyl as needed Bilateral  lower extremity leg pains: Encouraged her to drink more water and take Tylenol as needed. Patient is slightly constipated.  She will take laxative when she gets home.  Elevated blood sugars: Monitoring closely, discontinued Decadron   We will have to decide in the tumor board whether or not to remove the left-sided ALH. She does not want to receive treatment on the week of Thanksgiving.  Return to clinic weekly for Taxol treatments every other week for follow-up with me    No orders of the defined types were placed in this encounter.  The patient has a good understanding of the overall plan. she agrees with it. she will call with any problems that may develop before the next visit here.  Total time spent: 30 mins including face to face time and time spent for planning, charting and coordination of care  Rulon Eisenmenger, MD, MPH 05/05/2021  I, Thana Ates, am acting as scribe for Dr. [name].  I have reviewed the above documentation for accuracy and completeness, and I agree with the above.

## 2021-05-05 ENCOUNTER — Inpatient Hospital Stay: Payer: BC Managed Care – PPO

## 2021-05-05 ENCOUNTER — Inpatient Hospital Stay (HOSPITAL_BASED_OUTPATIENT_CLINIC_OR_DEPARTMENT_OTHER): Payer: BC Managed Care – PPO | Admitting: Hematology and Oncology

## 2021-05-05 ENCOUNTER — Other Ambulatory Visit: Payer: Self-pay

## 2021-05-05 DIAGNOSIS — C50411 Malignant neoplasm of upper-outer quadrant of right female breast: Secondary | ICD-10-CM

## 2021-05-05 DIAGNOSIS — Z5111 Encounter for antineoplastic chemotherapy: Secondary | ICD-10-CM | POA: Diagnosis not present

## 2021-05-05 DIAGNOSIS — Z170 Estrogen receptor positive status [ER+]: Secondary | ICD-10-CM | POA: Diagnosis not present

## 2021-05-05 DIAGNOSIS — Z95828 Presence of other vascular implants and grafts: Secondary | ICD-10-CM

## 2021-05-05 LAB — CMP (CANCER CENTER ONLY)
ALT: 29 U/L (ref 0–44)
AST: 24 U/L (ref 15–41)
Albumin: 3.5 g/dL (ref 3.5–5.0)
Alkaline Phosphatase: 73 U/L (ref 38–126)
Anion gap: 9 (ref 5–15)
BUN: 11 mg/dL (ref 6–20)
CO2: 24 mmol/L (ref 22–32)
Calcium: 9.1 mg/dL (ref 8.9–10.3)
Chloride: 107 mmol/L (ref 98–111)
Creatinine: 0.78 mg/dL (ref 0.44–1.00)
GFR, Estimated: >60 mL/min (ref >60)
Glucose, Bld: 177 mg/dL — ABNORMAL HIGH (ref 70–99)
Potassium: 4.1 mmol/L (ref 3.5–5.1)
Sodium: 140 mmol/L (ref 135–145)
Total Bilirubin: <0.2 mg/dL — ABNORMAL LOW (ref 0.3–1.2)
Total Protein: 7.4 g/dL (ref 6.5–8.1)

## 2021-05-05 LAB — CBC WITH DIFFERENTIAL (CANCER CENTER ONLY)
Abs Immature Granulocytes: 0.08 10*3/uL — ABNORMAL HIGH (ref 0.00–0.07)
Basophils Absolute: 0 10*3/uL (ref 0.0–0.1)
Basophils Relative: 1 %
Eosinophils Absolute: 0 10*3/uL (ref 0.0–0.5)
Eosinophils Relative: 0 %
HCT: 25.9 % — ABNORMAL LOW (ref 36.0–46.0)
Hemoglobin: 8.4 g/dL — ABNORMAL LOW (ref 12.0–15.0)
Immature Granulocytes: 1 %
Lymphocytes Relative: 14 %
Lymphs Abs: 1 10*3/uL (ref 0.7–4.0)
MCH: 28.2 pg (ref 26.0–34.0)
MCHC: 32.4 g/dL (ref 30.0–36.0)
MCV: 86.9 fL (ref 80.0–100.0)
Monocytes Absolute: 0.9 10*3/uL (ref 0.1–1.0)
Monocytes Relative: 13 %
Neutro Abs: 4.9 10*3/uL (ref 1.7–7.7)
Neutrophils Relative %: 71 %
Platelet Count: 355 10*3/uL (ref 150–400)
RBC: 2.98 MIL/uL — ABNORMAL LOW (ref 3.87–5.11)
RDW: 22.7 % — ABNORMAL HIGH (ref 11.5–15.5)
WBC Count: 7 10*3/uL (ref 4.0–10.5)
nRBC: 0 % (ref 0.0–0.2)

## 2021-05-05 LAB — MAGNESIUM: Magnesium: 1.6 mg/dL — ABNORMAL LOW (ref 1.7–2.4)

## 2021-05-05 MED ORDER — SODIUM CHLORIDE 0.9 % IV SOLN: 8.0000 mg | Freq: Once | INTRAVENOUS | Status: DC

## 2021-05-05 MED ORDER — SODIUM CHLORIDE 0.9% FLUSH
10.0000 mL | Freq: Once | INTRAVENOUS | Status: AC
  Administered 2021-05-05: 10 mL

## 2021-05-05 MED ORDER — ONDANSETRON HCL 4 MG/2ML IJ SOLN
8.0000 mg | Freq: Once | INTRAMUSCULAR | Status: AC
  Administered 2021-05-05: 8 mg via INTRAVENOUS
  Filled 2021-05-05: qty 4

## 2021-05-05 MED ORDER — DIPHENHYDRAMINE HCL 50 MG/ML IJ SOLN
25.0000 mg | Freq: Once | INTRAMUSCULAR | Status: AC
  Administered 2021-05-05: 25 mg via INTRAVENOUS
  Filled 2021-05-05: qty 1

## 2021-05-05 MED ORDER — SODIUM CHLORIDE 0.9 % IV SOLN: Freq: Once | INTRAVENOUS | Status: AC

## 2021-05-05 MED ORDER — FAMOTIDINE 20 MG IN NS 100 ML IVPB
20.0000 mg | Freq: Once | INTRAVENOUS | Status: AC
  Administered 2021-05-05: 20 mg via INTRAVENOUS
  Filled 2021-05-05: qty 100

## 2021-05-05 MED ORDER — HEPARIN SOD (PORK) LOCK FLUSH 100 UNIT/ML IV SOLN
500.0000 [IU] | Freq: Once | INTRAVENOUS | Status: AC | PRN
  Administered 2021-05-05: 500 [IU]

## 2021-05-05 MED ORDER — SODIUM CHLORIDE 0.9 % IV SOLN
80.0000 mg/m2 | Freq: Once | INTRAVENOUS | Status: AC
  Administered 2021-05-05: 174 mg via INTRAVENOUS
  Filled 2021-05-05: qty 29

## 2021-05-05 MED ORDER — SODIUM CHLORIDE 0.9% FLUSH
10.0000 mL | INTRAVENOUS | Status: DC | PRN
  Administered 2021-05-05: 10 mL

## 2021-05-05 NOTE — Patient Instructions (Signed)
[facility]   Discharge Instructions: Thank you for choosing [facility] to provide your oncology and hematology care.   If you have a lab appointment with the Cancer Center, please go directly to the Cancer Center and check in at the registration area.   Wear comfortable clothing and clothing appropriate for easy access to any Portacath or PICC line.   We strive to give you quality time with your provider. You may need to reschedule your appointment if you arrive late (15 or more minutes).  Arriving late affects you and other patients whose appointments are after yours.  Also, if you miss three or more appointments without notifying the office, you may be dismissed from the clinic at the provider's discretion.      For prescription refill requests, have your pharmacy contact our office and allow 72 hours for refills to be completed.    Today you received the following chemotherapy and/or immunotherapy agents: paclitaxel.      To help prevent nausea and vomiting after your treatment, we encourage you to take your nausea medication as directed.  BELOW ARE SYMPTOMS THAT SHOULD BE REPORTED IMMEDIATELY: *FEVER GREATER THAN 100.4 F (38 C) OR HIGHER *CHILLS OR SWEATING *NAUSEA AND VOMITING THAT IS NOT CONTROLLED WITH YOUR NAUSEA MEDICATION *UNUSUAL SHORTNESS OF BREATH *UNUSUAL BRUISING OR BLEEDING *URINARY PROBLEMS (pain or burning when urinating, or frequent urination) *BOWEL PROBLEMS (unusual diarrhea, constipation, pain near the anus) TENDERNESS IN MOUTH AND THROAT WITH OR WITHOUT PRESENCE OF ULCERS (sore throat, sores in mouth, or a toothache) UNUSUAL RASH, SWELLING OR PAIN  UNUSUAL VAGINAL DISCHARGE OR ITCHING   Items with * indicate a potential emergency and should be followed up as soon as possible or go to the Emergency Department if any problems should occur.  Please show the CHEMOTHERAPY ALERT CARD or IMMUNOTHERAPY ALERT CARD at check-in  to the Emergency Department and triage nurse.  Should you have questions after your visit or need to cancel or reschedule your appointment, please contact [facility]  Dept: [phone]  and follow the prompts.  Office hours are 8:00 a.m. to 4:30 p.m. Monday - Friday. Please note that voicemails left after 4:00 p.m. may not be returned until the following business day.  We are closed weekends and major holidays. You have access to a nurse at all times for urgent questions. Please call the main number to the clinic Dept: [phone] and follow the prompts.   For any non-urgent questions, you may also contact your provider using MyChart. We now offer e-Visits for anyone 18 and older to request care online for non-urgent symptoms. For details visit [email or web address].   Also download the MyChart app! Go to the app store, search "MyChart", open the app, select [facility], and log in with your MyChart username and password.  Due to Covid, a mask is required upon entering the hospital/clinic. If you do not have a mask, one will be given to you upon arrival. For doctor visits, patients may have 1 support person aged 18 or older with them. For treatment visits, patients cannot have anyone with them due to current Covid guidelines and our immunocompromised population.   

## 2021-05-05 NOTE — Assessment & Plan Note (Addendum)
02/15/2021:Screening mammogram: a possible mass in the right breast Diagnostic mammogram and [place]: a persistent irregular mass 1.2 cm within the upper-outer right breast. 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  Recommendationbased on multidisciplinary tumor board: 1. Neoadjuvant chemotherapy with Adriamycin and Cytoxan dose dense 4 followed by Taxol weekly 12  2. Followed by breast conserving surgery with targeted axillary dissection 3. 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  Chemo Toxicities: 1. Nausea  2. chemotherapy-induced anemia: Monitoring closely today's hemoglobin is 8.4. 3. Abdominal cramps: Resolved  4. Bilateral lower extremity leg cramps: Sent a prescription for muscle relaxant   Elevated blood sugars:Monitoring closely, discontinued Decadron  We will have to decide in the tumor board whether or not to remove the left-sided ALH. She does not want to receive treatment on the week of Thanksgiving.  Return to clinic weekly for Taxol treatments every other week for  follow-up with me

## 2021-05-10 ENCOUNTER — Telehealth: Payer: Self-pay | Admitting: Oncology

## 2021-05-10 NOTE — Telephone Encounter (Signed)
Scheduled appointment per melanie. Patient aware.

## 2021-05-11 ENCOUNTER — Telehealth: Payer: Self-pay | Admitting: *Deleted

## 2021-05-11 NOTE — Telephone Encounter (Signed)
Received call from pt with complaint of dry cough x1 day.  Pt denies fever or shortness of breath.  Per MD pt needing to take OTC cough syrup as directed on the bottle as well as utilize lozenges.  If symptoms do not improve or become worse, pt educated to call the office. Pt verbalized understanding.

## 2021-05-12 ENCOUNTER — Inpatient Hospital Stay: Payer: BC Managed Care – PPO

## 2021-05-12 ENCOUNTER — Other Ambulatory Visit: Payer: Self-pay

## 2021-05-12 ENCOUNTER — Other Ambulatory Visit: Payer: Self-pay | Admitting: Hematology and Oncology

## 2021-05-12 VITALS — BP 139/86 | HR 93 | Temp 98.2°F | Resp 18 | Wt 227.5 lb

## 2021-05-12 DIAGNOSIS — C50411 Malignant neoplasm of upper-outer quadrant of right female breast: Secondary | ICD-10-CM

## 2021-05-12 DIAGNOSIS — Z5111 Encounter for antineoplastic chemotherapy: Secondary | ICD-10-CM | POA: Diagnosis not present

## 2021-05-12 DIAGNOSIS — Z170 Estrogen receptor positive status [ER+]: Secondary | ICD-10-CM

## 2021-05-12 DIAGNOSIS — Z95828 Presence of other vascular implants and grafts: Secondary | ICD-10-CM

## 2021-05-12 LAB — CBC WITH DIFFERENTIAL/PLATELET
Abs Immature Granulocytes: 0.04 10*3/uL (ref 0.00–0.07)
Basophils Absolute: 0 10*3/uL (ref 0.0–0.1)
Basophils Relative: 0 %
Eosinophils Absolute: 0.1 10*3/uL (ref 0.0–0.5)
Eosinophils Relative: 1 %
HCT: 26.9 % — ABNORMAL LOW (ref 36.0–46.0)
Hemoglobin: 8.7 g/dL — ABNORMAL LOW (ref 12.0–15.0)
Immature Granulocytes: 1 %
Lymphocytes Relative: 19 %
Lymphs Abs: 1 10*3/uL (ref 0.7–4.0)
MCH: 28.7 pg (ref 26.0–34.0)
MCHC: 32.3 g/dL (ref 30.0–36.0)
MCV: 88.8 fL (ref 80.0–100.0)
Monocytes Absolute: 0.9 10*3/uL (ref 0.1–1.0)
Monocytes Relative: 16 %
Neutro Abs: 3.3 10*3/uL (ref 1.7–7.7)
Neutrophils Relative %: 63 %
Platelets: 340 10*3/uL (ref 150–400)
RBC: 3.03 MIL/uL — ABNORMAL LOW (ref 3.87–5.11)
RDW: 22.9 % — ABNORMAL HIGH (ref 11.5–15.5)
WBC: 5.3 10*3/uL (ref 4.0–10.5)
nRBC: 0 % (ref 0.0–0.2)

## 2021-05-12 LAB — COMPREHENSIVE METABOLIC PANEL
ALT: 26 U/L (ref 0–44)
AST: 18 U/L (ref 15–41)
Albumin: 3.6 g/dL (ref 3.5–5.0)
Alkaline Phosphatase: 77 U/L (ref 38–126)
Anion gap: 10 (ref 5–15)
BUN: 10 mg/dL (ref 6–20)
CO2: 22 mmol/L (ref 22–32)
Calcium: 9.2 mg/dL (ref 8.9–10.3)
Chloride: 106 mmol/L (ref 98–111)
Creatinine, Ser: 0.8 mg/dL (ref 0.44–1.00)
GFR, Estimated: >60 mL/min (ref >60)
Glucose, Bld: 202 mg/dL — ABNORMAL HIGH (ref 70–99)
Potassium: 3.9 mmol/L (ref 3.5–5.1)
Sodium: 138 mmol/L (ref 135–145)
Total Bilirubin: 0.3 mg/dL (ref 0.3–1.2)
Total Protein: 7.7 g/dL (ref 6.5–8.1)

## 2021-05-12 MED ORDER — SODIUM CHLORIDE 0.9 % IV SOLN
Freq: Once | INTRAVENOUS | Status: AC
Start: 2021-05-12 — End: 2021-05-12

## 2021-05-12 MED ORDER — FAMOTIDINE 20 MG IN NS 100 ML IVPB
20.0000 mg | Freq: Once | INTRAVENOUS | Status: AC
  Administered 2021-05-12: 20 mg via INTRAVENOUS
  Filled 2021-05-12: qty 100

## 2021-05-12 MED ORDER — ONDANSETRON HCL 4 MG/2ML IJ SOLN
8.0000 mg | Freq: Once | INTRAMUSCULAR | Status: AC
  Administered 2021-05-12: 8 mg via INTRAVENOUS
  Filled 2021-05-12: qty 4

## 2021-05-12 MED ORDER — SODIUM CHLORIDE 0.9% FLUSH
10.0000 mL | Freq: Once | INTRAVENOUS | Status: AC
  Administered 2021-05-12: 10 mL

## 2021-05-12 MED ORDER — SODIUM CHLORIDE 0.9 % IV SOLN
80.0000 mg/m2 | Freq: Once | INTRAVENOUS | Status: AC
  Administered 2021-05-12: 174 mg via INTRAVENOUS
  Filled 2021-05-12: qty 29

## 2021-05-17 ENCOUNTER — Telehealth: Payer: Self-pay

## 2021-05-17 NOTE — Telephone Encounter (Signed)
Pt called and states she still has a cough, denies fever but states she is sweating at night. Pt states she has taken OTC cough expectorant and it is not helping, reports clear mucus she is coughing up. Denies sore throat. Pt also states she is taking OTC herbal supplement for cold & flu but can not recall the name. Advised pt I would inform MD of concerns.

## 2021-05-18 ENCOUNTER — Telehealth: Payer: Self-pay

## 2021-05-18 ENCOUNTER — Other Ambulatory Visit: Payer: Self-pay

## 2021-05-18 MED ORDER — AZITHROMYCIN 250 MG PO TABS: ORAL_TABLET | ORAL | 0 refills | Status: DC

## 2021-05-18 NOTE — Telephone Encounter (Signed)
Called pt to inform of Rx sent in for z pack per MD.  Pt to take as directed on the packaging, pt VU and agrees to call back if symptoms do not improve.

## 2021-05-19 ENCOUNTER — Inpatient Hospital Stay: Payer: BC Managed Care – PPO

## 2021-05-19 ENCOUNTER — Ambulatory Visit: Payer: BC Managed Care – PPO | Admitting: Physician Assistant

## 2021-05-19 ENCOUNTER — Encounter: Payer: Self-pay | Admitting: Hematology and Oncology

## 2021-05-19 ENCOUNTER — Other Ambulatory Visit: Payer: Self-pay

## 2021-05-19 ENCOUNTER — Inpatient Hospital Stay: Payer: BC Managed Care – PPO | Attending: Hematology and Oncology | Admitting: Adult Health

## 2021-05-19 ENCOUNTER — Encounter: Payer: Self-pay | Admitting: Adult Health

## 2021-05-19 VITALS — BP 158/95 | HR 96 | Temp 97.9°F | Resp 18 | Ht 61.0 in | Wt 228.4 lb

## 2021-05-19 DIAGNOSIS — Z95828 Presence of other vascular implants and grafts: Secondary | ICD-10-CM

## 2021-05-19 DIAGNOSIS — C50411 Malignant neoplasm of upper-outer quadrant of right female breast: Secondary | ICD-10-CM | POA: Diagnosis not present

## 2021-05-19 DIAGNOSIS — C773 Secondary and unspecified malignant neoplasm of axilla and upper limb lymph nodes: Secondary | ICD-10-CM | POA: Insufficient documentation

## 2021-05-19 DIAGNOSIS — Z5111 Encounter for antineoplastic chemotherapy: Secondary | ICD-10-CM | POA: Insufficient documentation

## 2021-05-19 DIAGNOSIS — D6481 Anemia due to antineoplastic chemotherapy: Secondary | ICD-10-CM | POA: Insufficient documentation

## 2021-05-19 DIAGNOSIS — E119 Type 2 diabetes mellitus without complications: Secondary | ICD-10-CM | POA: Insufficient documentation

## 2021-05-19 DIAGNOSIS — M7989 Other specified soft tissue disorders: Secondary | ICD-10-CM | POA: Diagnosis not present

## 2021-05-19 DIAGNOSIS — G2581 Restless legs syndrome: Secondary | ICD-10-CM | POA: Insufficient documentation

## 2021-05-19 DIAGNOSIS — Z170 Estrogen receptor positive status [ER+]: Secondary | ICD-10-CM | POA: Diagnosis not present

## 2021-05-19 LAB — COMPREHENSIVE METABOLIC PANEL
ALT: 25 U/L (ref 0–44)
AST: 17 U/L (ref 15–41)
Albumin: 3.7 g/dL (ref 3.5–5.0)
Alkaline Phosphatase: 83 U/L (ref 38–126)
Anion gap: 9 (ref 5–15)
BUN: 11 mg/dL (ref 6–20)
CO2: 24 mmol/L (ref 22–32)
Calcium: 9.4 mg/dL (ref 8.9–10.3)
Chloride: 105 mmol/L (ref 98–111)
Creatinine, Ser: 0.83 mg/dL (ref 0.44–1.00)
GFR, Estimated: >60 mL/min (ref >60)
Glucose, Bld: 270 mg/dL — ABNORMAL HIGH (ref 70–99)
Potassium: 4.1 mmol/L (ref 3.5–5.1)
Sodium: 138 mmol/L (ref 135–145)
Total Bilirubin: 0.3 mg/dL (ref 0.3–1.2)
Total Protein: 7.9 g/dL (ref 6.5–8.1)

## 2021-05-19 LAB — CBC WITH DIFFERENTIAL/PLATELET
Abs Immature Granulocytes: 0.05 10*3/uL (ref 0.00–0.07)
Basophils Absolute: 0 10*3/uL (ref 0.0–0.1)
Basophils Relative: 1 %
Eosinophils Absolute: 0.1 10*3/uL (ref 0.0–0.5)
Eosinophils Relative: 2 %
HCT: 28.2 % — ABNORMAL LOW (ref 36.0–46.0)
Hemoglobin: 9.1 g/dL — ABNORMAL LOW (ref 12.0–15.0)
Immature Granulocytes: 1 %
Lymphocytes Relative: 18 %
Lymphs Abs: 1.1 10*3/uL (ref 0.7–4.0)
MCH: 29.4 pg (ref 26.0–34.0)
MCHC: 32.3 g/dL (ref 30.0–36.0)
MCV: 91.3 fL (ref 80.0–100.0)
Monocytes Absolute: 0.7 10*3/uL (ref 0.1–1.0)
Monocytes Relative: 11 %
Neutro Abs: 4 10*3/uL (ref 1.7–7.7)
Neutrophils Relative %: 67 %
Platelets: 252 10*3/uL (ref 150–400)
RBC: 3.09 MIL/uL — ABNORMAL LOW (ref 3.87–5.11)
RDW: 22.5 % — ABNORMAL HIGH (ref 11.5–15.5)
WBC: 6 10*3/uL (ref 4.0–10.5)
nRBC: 0 % (ref 0.0–0.2)

## 2021-05-19 MED ORDER — SODIUM CHLORIDE 0.9 % IV SOLN
Freq: Once | INTRAVENOUS | Status: AC
Start: 2021-05-19 — End: 2021-05-19

## 2021-05-19 MED ORDER — SODIUM CHLORIDE 0.9% FLUSH
10.0000 mL | INTRAVENOUS | Status: DC | PRN
  Administered 2021-05-19: 10 mL

## 2021-05-19 MED ORDER — HEPARIN SOD (PORK) LOCK FLUSH 100 UNIT/ML IV SOLN: 500.0000 [IU] | Freq: Once | INTRAVENOUS | Status: DC

## 2021-05-19 MED ORDER — HEPARIN SOD (PORK) LOCK FLUSH 100 UNIT/ML IV SOLN
500.0000 [IU] | Freq: Once | INTRAVENOUS | Status: AC | PRN
  Administered 2021-05-19: 500 [IU]

## 2021-05-19 MED ORDER — SODIUM CHLORIDE 0.9 % IV SOLN
80.0000 mg/m2 | Freq: Once | INTRAVENOUS | Status: AC
  Administered 2021-05-19: 174 mg via INTRAVENOUS
  Filled 2021-05-19: qty 29

## 2021-05-19 MED ORDER — FAMOTIDINE 20 MG IN NS 100 ML IVPB
20.0000 mg | Freq: Once | INTRAVENOUS | Status: AC
  Administered 2021-05-19: 20 mg via INTRAVENOUS
  Filled 2021-05-19: qty 100

## 2021-05-19 MED ORDER — SODIUM CHLORIDE 0.9% FLUSH: 10.0000 mL | Freq: Once | INTRAVENOUS | Status: DC

## 2021-05-19 MED ORDER — ONDANSETRON HCL 4 MG/2ML IJ SOLN
8.0000 mg | Freq: Once | INTRAMUSCULAR | Status: AC
  Administered 2021-05-19: 8 mg via INTRAVENOUS
  Filled 2021-05-19: qty 4

## 2021-05-19 NOTE — Progress Notes (Signed)
Sandra Bishop:    [name], MD [name] [address]   DIAGNOSIS: Cancer Staging Malignant neoplasm of upper-outer quadrant of right breast in female, estrogen receptor positive ([name]) Staging form: Breast, AJCC 8th Edition - Clinical stage from 02/23/2021: Stage IIB (cT1c, cN1(f), cM0, G3, ER+, PR-, HER2-) - Signed by [name], MD on 02/23/2021 Stage prefix: Initial diagnosis Method of lymph node assessment: Core biopsy Histologic grading system: 3 grade system   SUMMARY OF ONCOLOGIC HISTORY: Oncology History  Malignant neoplasm of upper-outer quadrant of right breast in female, estrogen receptor positive ([name])  02/15/2021 Initial Diagnosis   Screening mammogram: a possible mass in the right breast  Diagnostic mammogram and [place]: a persistent irregular mass 1.2 cm within the upper-outer right breast. 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    02/23/2021 Cancer Staging   Staging form: Breast, AJCC 8th Edition - Clinical stage from 02/23/2021: Stage IIB (cT1c, cN1(f), cM0, G3, ER+, PR-, HER2-) - Signed by [name], MD on 02/23/2021 Stage prefix: Initial diagnosis Method of lymph node assessment: Core biopsy Histologic grading system: 3 grade system    03/04/2021 -  Chemotherapy   Patient is on Treatment Plan : BREAST ADJUVANT DOSE DENSE AC q14d / PACLitaxel q7d      Genetic Testing   Negative genetic testing. No pathogenic variants identified on the [facility] CancerNext-Expanded+RNA panel. The report date is 03/10/2021.  The CancerNext-Expanded + RNAinsight gene panel offered by [employer] and includes sequencing and rearrangement analysis for the following 77 genes: IP, ALK, APC*, ATM*, AXIN2, BAP1, BARD1, BLM, BMPR1A, BRCA1*, BRCA2*, BRIP1*, CDC73, CDH1*,CDK4, CDKN1B, CDKN2A, CHEK2*, CTNNA1, DICER1, FANCC, FH, FLCN, GALNT12, KIF1B, LZTR1,  MAX, MEN1, MET, MLH1*, MSH2*, MSH3, MSH6*, MUTYH*, NBN, NF1*, NF2, NTHL1, PALB2*, PHOX2B, PMS2*, POT1, PRKAR1A, PTCH1, PTEN*, RAD51C*, RAD51D*,RB1, RECQL, RET, SDHA, SDHAF2, SDHB, SDHC, SDHD, SMAD4, SMARCA4, SMARCB1, SMARCE1, STK11, SUFU, TMEM127, TP53*,TSC1, TSC2, VHL and XRCC2 (sequencing and deletion/duplication); EGFR, EGLN1, HOXB13, KIT, MITF, PDGFRA, POLD1 and POLE (sequencing only); EPCAM and GREM1 (deletion/duplication only).     CURRENT THERAPY:  Weekly Taxol  INTERVAL HISTORY: Sandra Bishop 50 y.o. female returns for evaluation prior to receiving her weekly Taxol.  This week is week #4.  She notes that she is doing well.  She has had a cough and is taking azithromycin for this.  She notes the cough is improving.  She met with her PCP and notes her blood sugar has been slowly increasing.  Her PCP suggested she go back on metformin.  [name] does not want to go back on metformin because she had difficulty tolerating the medication.  [name] also notes that she has had left neck swelling.  Her port is on the left side.  She thinks it may be due to her coughing.  But is unsure.  She had 1 episode of peripheral neuropathy in her second third and fourth digits of her right hand.  This happened for a brief period of time a few days ago and has resolved since.  She has had no further peripheral neuropathy in her fingertips or toes.   Patient Active Problem List   Diagnosis Date Noted   Genetic testing 03/17/2021   Port-A-Cath in place 03/03/2021   Family history of breast cancer 02/24/2021   Family history of pancreatic cancer 02/24/2021   Family history of colon cancer 02/24/2021   Family history  of lung cancer 02/24/2021   Malignant neoplasm of upper-outer quadrant of right breast in female, estrogen receptor positive ([name]) 02/15/2021   Vaginal discharge 01/08/2019   Bacterial vaginosis 01/08/2019   Anemia    Obesity    Pancreatitis 09/02/2011   Constipation 08/30/2011     has No Known Allergies.  MEDICAL HISTORY: Past Medical History:  Diagnosis Date   Allergy    Anemia    Breast cancer ([place])    Right   Constipation    Diabetes mellitus without complication ([name])    Family history of breast cancer    Family history of colon cancer    Family history of lung cancer    Family history of pancreatic cancer    History of migraine    Obesity    Palpitations     SURGICAL HISTORY: Past Surgical History:  Procedure Laterality Date   BREAST BIOPSY Right 02/11/2021   x2   BREAST BIOPSY Right 03/22/2021   x3   EYE SURGERY Right    PORTACATH PLACEMENT N/A 03/02/2021   Procedure: INSERTION PORT-A-CATH;  Surgeon: [name], MD;  Location: [facility];  Service: General;  Laterality: N/A;   TUBAL LIGATION      SOCIAL HISTORY: Social History   Socioeconomic History   Marital status: Divorced    Spouse name: Not on file   Number of children: 2   Years of education: Not on file   Highest education level: Not on file  Occupational History   Not on file  Tobacco Use   Smoking status: Former    Types: Cigarettes    Quit date: 12/28/2010    Years since quitting: 10.3   Smokeless tobacco: Never   Tobacco comments:    quit in 2012, previously smoked 15 years  Vaping Use   Vaping Use: Not on file  Substance and Sexual Activity   Alcohol use: Not Currently    Comment: occ   Drug use: No   Sexual activity: Yes    Birth control/protection: Surgical  Other Topics Concern   Not on file  Social History Narrative   Not on file   Social Determinants of Health   Financial Resource Strain: Not on file  Food Insecurity: Not on file  Transportation Needs: Not on file  Physical Activity: Not on file  Stress: Not on file  Social Connections: Not on file  Intimate Partner Violence: Not on file    FAMILY HISTORY: Family History  Problem Relation Age of Onset   Hypertension Mother    Pancreatic cancer Father 98   Breast cancer Half-Sister 45        negative genetic testing   Colon cancer Maternal Uncle        dx >50   Lung cancer Maternal Uncle        dx 66s, hx smoking   Cancer Paternal Aunt        unknown type, dx early 73s   Heart failure Maternal Grandmother    Breast cancer Other 78       mother's first cousin    Review of Systems  Constitutional:  Positive for fatigue. Negative for appetite change, chills, fever and unexpected weight change.  HENT:   Negative for hearing loss, lump/mass, mouth sores and trouble swallowing.   Eyes:  Negative for eye problems and icterus.  Respiratory:  Positive for cough. Negative for chest tightness and shortness of breath.   Cardiovascular:  Negative for chest pain, leg swelling and palpitations.  Gastrointestinal:  Negative for abdominal distention, abdominal pain, constipation, diarrhea, nausea and vomiting.  Endocrine: Negative for hot flashes.  Genitourinary:  Negative for difficulty urinating.   Musculoskeletal:  Negative for arthralgias.  Skin:  Negative for itching and rash.  Neurological:  Negative for dizziness, extremity weakness, headaches and numbness.  Hematological:  Negative for adenopathy. Does not bruise/bleed easily.  Psychiatric/Behavioral:  Negative for depression. The patient is not nervous/anxious.      PHYSICAL EXAMINATION  ECOG PERFORMANCE STATUS: 1 - Symptomatic but completely ambulatory  Vitals:   05/19/21 1137  BP: (!) 158/95  Pulse: 96  Resp: 18  Temp: 97.9 F (36.6 C)  SpO2: 99%    Physical Exam Constitutional:      General: She is not in acute distress.    Appearance: Normal appearance. She is not toxic-appearing.  HENT:     Head: Normocephalic and atraumatic.  Eyes:     General: No scleral icterus. Cardiovascular:     Rate and Rhythm: Normal rate and regular rhythm.     Pulses: Normal pulses.     Heart sounds: Normal heart sounds.  Pulmonary:     Effort: Pulmonary effort is normal.     Breath sounds: Normal breath sounds.   Abdominal:     General: Abdomen is flat. Bowel sounds are normal. There is no distension.     Palpations: Abdomen is soft.     Tenderness: There is no abdominal tenderness.  Musculoskeletal:        General: No swelling.     Cervical back: Neck supple.  Lymphadenopathy:     Cervical: No cervical adenopathy.  Skin:    General: Skin is warm and dry.     Findings: No rash.  Neurological:     General: No focal deficit present.     Mental Status: She is alert.  Psychiatric:        Mood and Affect: Mood normal.        Behavior: Behavior normal.    LABORATORY DATA:  CBC    Component Value Date/Time   WBC 6.0 05/19/2021 1128   RBC 3.09 (L) 05/19/2021 1128   HGB 9.1 (L) 05/19/2021 1128   HGB 8.4 (L) 05/05/2021 0816   HGB 11.5 08/11/2020 1654   HCT 28.2 (L) 05/19/2021 1128   HCT 35.9 08/11/2020 1654   PLT 252 05/19/2021 1128   PLT 355 05/05/2021 0816   PLT 330 10/06/2019 1516   MCV 91.3 05/19/2021 1128   MCV 81 08/11/2020 1654   MCH 29.4 05/19/2021 1128   MCHC 32.3 05/19/2021 1128   RDW 22.5 (H) 05/19/2021 1128   RDW 14.1 08/11/2020 1654   LYMPHSABS 1.1 05/19/2021 1128   LYMPHSABS 2.9 08/11/2020 1654   MONOABS 0.7 05/19/2021 1128   EOSABS 0.1 05/19/2021 1128   EOSABS 0.2 08/11/2020 1654   BASOSABS 0.0 05/19/2021 1128   BASOSABS 0.0 08/11/2020 1654    CMP     Component Value Date/Time   NA 138 05/19/2021 1128   NA 140 08/11/2020 1654   K 4.1 05/19/2021 1128   CL 105 05/19/2021 1128   CO2 24 05/19/2021 1128   GLUCOSE 270 (H) 05/19/2021 1128   BUN 11 05/19/2021 1128   BUN 12 08/11/2020 1654   CREATININE 0.83 05/19/2021 1128   CREATININE 0.78 05/05/2021 0816   CREATININE 0.78 09/29/2015 1621   CALCIUM 9.4 05/19/2021 1128   PROT 7.9 05/19/2021 1128   PROT 8.0 08/11/2020 1654   ALBUMIN 3.7 05/19/2021 1128  ALBUMIN 4.2 08/11/2020 1654   AST 17 05/19/2021 1128   AST 24 05/05/2021 0816   ALT 25 05/19/2021 1128   ALT 29 05/05/2021 0816   ALKPHOS 83 05/19/2021  1128   BILITOT 0.3 05/19/2021 1128   BILITOT <0.2 (L) 05/05/2021 0816   GFRNONAA >60 05/19/2021 1128   GFRNONAA >60 05/05/2021 0816   GFRAA 110 08/11/2020 1654       ASSESSMENT and THERAPY PLAN:   Malignant neoplasm of upper-outer quadrant of right breast in female, estrogen receptor positive ([place]) 02/15/2021:Screening mammogram: a possible mass in the right breast  Diagnostic mammogram and [place]: a persistent irregular mass 1.2 cm within the upper-outer right breast. 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    Recommendation based on multidisciplinary tumor board: 1. Neoadjuvant chemotherapy with Adriamycin and Cytoxan dose dense 4 followed by Taxol weekly 12  2. Followed by breast conserving surgery with  targeted axillary dissection 3. Followed by adjuvant radiation therapy 4.  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   Saachi will continue on her current chemotherapy with weekly paclitaxel.  I reviewed her blood sugars.  Her Decadron has been discontinued however her blood sugars remain persistently elevated.  I have reached  out to our clinical pharmacy practitioner John and cochlea to determine if she can restart the Rybelsus that she was taking.  She is not having any further peripheral neuropathy.  We will continue to monitor this closely.  She will continue with paclitaxel weekly.  We discussed her neck swelling.  She will undergo a Doppler.  I have asked that my nurse help get this scheduled.  We will see Mariacristina back weekly for her Taxol and every other week for follow-Bishop.  She knows to call us if her sugars continue to increase and also if her cough worsens or if she develops any other concerns.   Return to clinic weekly for Taxol treatments every other week for follow-Bishop    All questions were answered. The patient knows to call the clinic with any problems, questions or concerns. We can certainly see the patient much sooner if necessary.  Total encounter time: 30 minutes of face-to-face visit time, chart review, lab review, order entry, care coordination, and documentation of the encounter.  Wilber Bihari, NP 05/19/21 12:32 PM Medical Oncology and Hematology [facility] [place], [place] [address] Tel. [phone]    Fax. [phone]  *Total Encounter Time as defined by the Centers for Medicare and Medicaid Services includes, in addition to the face-to-face time of a patient visit (documented in the note above) non-face-to-face time: obtaining and reviewing outside history, ordering and reviewing medications, tests or procedures, care coordination (communications with other health care professionals or caregivers) and documentation in the medical record.

## 2021-05-19 NOTE — Assessment & Plan Note (Addendum)
02/15/2021:Screening mammogram: a possible mass in the right breast Diagnostic mammogram and [place]: a persistent irregular mass 1.2 cm within the upper-outer right breast. 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  Recommendationbased on multidisciplinary tumor board: 1. Neoadjuvant chemotherapy with Adriamycin and Cytoxan dose dense 4 followed by Taxol weekly 12  2. Followed by breast conserving surgery with targeted axillary dissection 3. 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  Sandra Bishop will continue on her current chemotherapy with weekly paclitaxel.  I reviewed her blood sugars.  Her Decadron has been discontinued however her blood sugars remain persistently elevated.  I have reached out to our clinical pharmacy practitioner John and cochlea to determine if she can restart the Rybelsus that she was taking.  She is not having any further peripheral neuropathy.  We will continue to monitor this closely.  She will continue with paclitaxel weekly.  We discussed her neck swelling.  She will  undergo a Doppler.  I have asked that my nurse help get this scheduled.  We will see Sandra Bishop back weekly for her Taxol and every other week for follow-up.  She knows to call us if her sugars continue to increase and also if her cough worsens or if she develops any other concerns.   Return to clinic weekly for Taxol treatments every other week for follow-up

## 2021-05-19 NOTE — Patient Instructions (Signed)
Implanted Port Home Guide An implanted port is a device that is placed under the skin. It is usually placed in the chest. The device can be used to give IV medicine, to take blood, or for dialysis. You may have an implanted port if: You need IV medicine that would be irritating to the small veins in your hands or arms. You need IV medicines, such as antibiotics, for a long period of time. You need IV nutrition for a long period of time. You need dialysis. When you have a port, your health care provider can choose to use the port instead of veins in your arms for these procedures. You may have fewer limitations when using a port than you would if you used other types of long-term IVs, and you will likely be able to return to normal activities after your incision heals. An implanted port has two main parts: Reservoir. The reservoir is the part where a needle is inserted to give medicines or draw blood. The reservoir is round. After it is placed, it appears as a small, raised area under your skin. Catheter. The catheter is a thin, flexible tube that connects the reservoir to a vein. Medicine that is inserted into the reservoir goes into the catheter and then into the vein. How is my port accessed? To access your port: A numbing cream may be placed on the skin over the port site. Your health care provider will put on a mask and sterile gloves. The skin over your port will be cleaned carefully with a germ-killing soap and allowed to dry. Your health care provider will gently pinch the port and insert a needle into it. Your health care provider will check for a blood return to make sure the port is in the vein and is not clogged. If your port needs to remain accessed to get medicine continuously (constant infusion), your health care provider will place a clear bandage (dressing) over the needle site. The dressing and needle will need to be changed every week, or as told by your health care provider. What  is flushing? Flushing helps keep the port from getting clogged. Follow instructions from your health care provider about how and when to flush the port. Ports are usually flushed with saline solution or a medicine called heparin. The need for flushing will depend on how the port is used: If the port is only used from time to time to give medicines or draw blood, the port may need to be flushed: Before and after medicines have been given. Before and after blood has been drawn. As part of routine maintenance. Flushing may be recommended every 4-6 weeks. If a constant infusion is running, the port may not need to be flushed. Throw away any syringes in a disposal container that is meant for sharp items (sharps container). You can buy a sharps container from a pharmacy, or you can make one by using an empty hard plastic bottle with a cover. How long will my port stay implanted? The port can stay in for as long as your health care provider thinks it is needed. When it is time for the port to come out, a surgery will be done to remove it. The surgery will be similar to the procedure that was done to put the port in. Follow these instructions at home:  Flush your port as told by your health care provider. If you need an infusion over several days, follow instructions from your health care provider about how   to take care of your port site. Make sure you: Wash your hands with soap and water before you change your dressing. If soap and water are not available, use alcohol-based hand sanitizer. Change your dressing as told by your health care provider. Place any used dressings or infusion bags into a plastic bag. Throw that bag in the trash. Keep the dressing that covers the needle clean and dry. Do not get it wet. Do not use scissors or sharp objects near the tube. Keep the tube clamped, unless it is being used. Check your port site every day for signs of infection. Check for: Redness, swelling, or  pain. Fluid or blood. Pus or a bad smell. Protect the skin around the port site. Avoid wearing bra straps that rub or irritate the site. Protect the skin around your port from seat belts. Place a soft pad over your chest if needed. Bathe or shower as told by your health care provider. The site may get wet as long as you are not actively receiving an infusion. Return to your normal activities as told by your health care provider. Ask your health care provider what activities are safe for you. Carry a medical alert card or wear a medical alert bracelet at all times. This will let health care providers know that you have an implanted port in case of an emergency. Get help right away if: You have redness, swelling, or pain at the port site. You have fluid or blood coming from your port site. You have pus or a bad smell coming from the port site. You have a fever. Summary Implanted ports are usually placed in the chest for long-term IV access. Follow instructions from your health care provider about flushing the port and changing bandages (dressings). Take care of the area around your port by avoiding clothing that puts pressure on the area, and by watching for signs of infection. Protect the skin around your port from seat belts. Place a soft pad over your chest if needed. Get help right away if you have a fever or you have redness, swelling, pain, drainage, or a bad smell at the port site. This information is not intended to replace advice given to you by your health care provider. Make sure you discuss any questions you have with your health care provider. Document Revised: 09/22/2020 Document Reviewed: 11/17/2019 Elsevier Patient Education  2022 Elsevier Inc.  

## 2021-05-20 ENCOUNTER — Ambulatory Visit (HOSPITAL_COMMUNITY)
Admission: RE | Admit: 2021-05-20 | Discharge: 2021-05-20 | Disposition: A | Discharge status: Home / Self Care | Payer: BC Managed Care – PPO | Source: Ambulatory Visit | Attending: Adult Health | Admitting: Adult Health

## 2021-05-20 ENCOUNTER — Inpatient Hospital Stay (HOSPITAL_BASED_OUTPATIENT_CLINIC_OR_DEPARTMENT_OTHER): Payer: BC Managed Care – PPO | Admitting: Physician Assistant

## 2021-05-20 VITALS — BP 141/89 | HR 97 | Temp 98.0°F | Resp 18 | Wt 230.6 lb

## 2021-05-20 DIAGNOSIS — Z170 Estrogen receptor positive status [ER+]: Secondary | ICD-10-CM

## 2021-05-20 DIAGNOSIS — R221 Localized swelling, mass and lump, neck: Secondary | ICD-10-CM

## 2021-05-20 DIAGNOSIS — C50411 Malignant neoplasm of upper-outer quadrant of right female breast: Secondary | ICD-10-CM | POA: Insufficient documentation

## 2021-05-20 DIAGNOSIS — M7989 Other specified soft tissue disorders: Secondary | ICD-10-CM | POA: Diagnosis not present

## 2021-05-20 DIAGNOSIS — Z5111 Encounter for antineoplastic chemotherapy: Secondary | ICD-10-CM | POA: Diagnosis not present

## 2021-05-20 NOTE — Progress Notes (Signed)
Symptom Management Consult note [facility]    Patient Care Team: [name], MD as PCP - General (Family Medicine) [name], RN as Oncology Nurse Navigator [name], RN as Oncology Nurse Navigator [name], MD as Consulting Physician (General Surgery) [name], MD as Consulting Physician (Hematology and Oncology) [name], MD as Consulting Physician (Radiation Oncology)    Name of the patient: Sandra Bishop  [id number]  1970-09-05   Date of visit: 05/20/2021    Chief complaint/ Reason for visit- left neck swelling  Oncology History  Malignant neoplasm of upper-outer quadrant of right breast in female, estrogen receptor positive ([place])  02/15/2021 Initial Diagnosis   Screening mammogram: a possible mass in the right breast  Diagnostic mammogram and [place]: a persistent irregular mass 1.2 cm within the upper-outer right breast. 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    02/23/2021 Cancer Staging   Staging form: Breast, AJCC 8th Edition - Clinical stage from 02/23/2021: Stage IIB (cT1c, cN1(f), cM0, G3, ER+, PR-, HER2-) - Signed by [name], MD on 02/23/2021 Stage prefix: Initial diagnosis Method of lymph node assessment: Core biopsy Histologic grading system: 3 grade system    03/04/2021 -  Chemotherapy   Patient is on Treatment Plan : BREAST ADJUVANT DOSE DENSE AC q14d / PACLitaxel q7d      Genetic Testing   Negative genetic testing. No pathogenic variants identified on the [facility] CancerNext-Expanded+RNA panel. The report date is 03/10/2021.  The CancerNext-Expanded + RNAinsight gene panel offered by [employer] and includes sequencing and rearrangement analysis for the following 77 genes: IP, ALK, APC*, ATM*, AXIN2, BAP1, BARD1, BLM, BMPR1A, BRCA1*, BRCA2*, BRIP1*, CDC73, CDH1*,CDK4, CDKN1B, CDKN2A, CHEK2*, CTNNA1, DICER1, FANCC, FH, FLCN,  GALNT12, KIF1B, LZTR1, MAX, MEN1, MET, MLH1*, MSH2*, MSH3, MSH6*, MUTYH*, NBN, NF1*, NF2, NTHL1, PALB2*, PHOX2B, PMS2*, POT1, PRKAR1A, PTCH1, PTEN*, RAD51C*, RAD51D*,RB1, RECQL, RET, SDHA, SDHAF2, SDHB, SDHC, SDHD, SMAD4, SMARCA4, SMARCB1, SMARCE1, STK11, SUFU, TMEM127, TP53*,TSC1, TSC2, VHL and XRCC2 (sequencing and deletion/duplication); EGFR, EGLN1, HOXB13, KIT, MITF, PDGFRA, POLD1 and POLE (sequencing only); EPCAM and GREM1 (deletion/duplication only).     Current Therapy: taxol last received day 1 cycle 4 05/19/21. Received pegfilgrastim-cbqv 04/16/21.  Interval history- Sandra Bishop is a 50 yo female with history as above presenting to [facility] today with chief complaint of left neck swelling x approximately 2 weeks. Patient states she has noticed the swelling after a nonproductive cough started. She started z-pak x 2 days ago and feel that the cough is improving. She put a cold compress on her neck last night and swelling resolved. No OTC medications for symptoms prior to arrival. She denies being in any pain. Denies fever, chills, weight loss, night sweats, shortness of breath, chest pain, nausea, vomiting, abdominal pain. Patient admits to good appetite.   ROS  All other systems are reviewed and are negative for acute change except as noted in the HPI.    No Known Allergies   Past Medical History:  Diagnosis Date   Allergy    Anemia    Breast cancer ([place])    Right   Constipation    Diabetes mellitus without complication ([place])    Family history of breast cancer    Family history of colon cancer    Family history of lung cancer    Family history of pancreatic cancer    History of migraine  Obesity    Palpitations      Past Surgical History:  Procedure Laterality Date   BREAST BIOPSY Right 02/11/2021   x2   BREAST BIOPSY Right 03/22/2021   x3   EYE SURGERY Right    PORTACATH PLACEMENT N/A 03/02/2021   Procedure: INSERTION PORT-A-CATH;  Surgeon: [name], MD;   Location: [facility];  Service: General;  Laterality: N/A;   TUBAL LIGATION      Social History   Socioeconomic History   Marital status: Divorced    Spouse name: Not on file   Number of children: 2   Years of education: Not on file   Highest education level: Not on file  Occupational History   Not on file  Tobacco Use   Smoking status: Former    Types: Cigarettes    Quit date: 12/28/2010    Years since quitting: 10.4   Smokeless tobacco: Never   Tobacco comments:    quit in 2012, previously smoked 15 years  Vaping Use   Vaping Use: Not on file  Substance and Sexual Activity   Alcohol use: Not Currently    Comment: occ   Drug use: No   Sexual activity: Yes    Birth control/protection: Surgical  Other Topics Concern   Not on file  Social History Narrative   Not on file   Social Determinants of Health   Financial Resource Strain: Not on file  Food Insecurity: Not on file  Transportation Needs: Not on file  Physical Activity: Not on file  Stress: Not on file  Social Connections: Not on file  Intimate Partner Violence: Not on file    Family History  Problem Relation Age of Onset   Hypertension Mother    Pancreatic cancer Father 45   Breast cancer Half-Sister 88       negative genetic testing   Colon cancer Maternal Uncle        dx >50   Lung cancer Maternal Uncle        dx 70s, hx smoking   Cancer Paternal Aunt        unknown type, dx early 82s   Heart failure Maternal Grandmother    Breast cancer Other 58       mother's first cousin     Current Outpatient Medications:    aspirin EC 81 MG tablet, Take 81 mg by mouth daily. Swallow whole., Disp: , Rfl:    azithromycin (ZITHROMAX Z-PAK) 250 MG tablet, Take as directed, Disp: 6 each, Rfl: 0   blood glucose meter kit and supplies, Dispense based on patient and insurance preference. Use up to four times daily as directed. (FOR ICD-10 E10.9, E11.9).  ( One-Touch Verio meter), Disp: 1 each, Rfl: 0   Cholecalciferol  (VITAMIN D3) 50 MCG (2000 UT) TABS, Take 2,000 Units by mouth daily., Disp: , Rfl:    dicyclomine (BENTYL) 10 MG capsule, TAKE 1 CAPSULE(10 MG) BY MOUTH FOUR TIMES DAILY BEFORE MEALS AND AT BEDTIME, Disp: 14 capsule, Rfl: 0   fluconazole (DIFLUCAN) 100 MG tablet, Take 1 tablet (100 mg total) by mouth daily. Take one tablet today 03/25/21 and second tablet on 03/27/21., Disp: 2 tablet, Rfl: 0   Lancets (ONETOUCH ULTRASOFT) lancets, Use to check blood glucose three times daily. Use as instructed. E11.9., Disp: 100 each, Rfl: 12   lidocaine-prilocaine (EMLA) cream, Apply to affected area once, Disp: 30 g, Rfl: 3   nystatin-triamcinolone ointment (MYCOLOG), Apply 1 application topically 2 (two) times daily., Disp: 30  g, Rfl: 0   ondansetron (ZOFRAN ODT) 8 MG disintegrating tablet, Take 1 tablet (8 mg total) by mouth every 8 (eight) hours as needed for nausea or vomiting., Disp: 20 tablet, Rfl: 0   ondansetron (ZOFRAN) 8 MG tablet, Take 1 tablet (8 mg total) by mouth 2 (two) times daily as needed. Start on the third day after chemotherapy., Disp: 30 tablet, Rfl: 1   ONETOUCH VERIO test strip, TEST FOUR TIMES DAILY AS DIRECTED, Disp: 100 strip, Rfl: 2   prochlorperazine (COMPAZINE) 10 MG tablet, Take 1 tablet (10 mg total) by mouth every 6 (six) hours as needed (Nausea or vomiting)., Disp: 30 tablet, Rfl: 1   rosuvastatin (CRESTOR) 5 MG tablet, Take 5 mg by mouth every Tuesday., Disp: , Rfl:    Semaglutide (RYBELSUS) 3 MG TABS, Take 3 mg by mouth daily before breakfast., Disp: , Rfl:    traZODone (DESYREL) 50 MG tablet, Take 1 tablet (50 mg total) by mouth at bedtime., Disp: , Rfl:    Turmeric 450 MG CAPS, Take 900 mg by mouth daily., Disp: , Rfl:   PHYSICAL EXAM: ECOG FS:1 - Symptomatic but completely ambulatory    Vitals:   05/20/21 0952  BP: (!) 141/89  Pulse: 97  Resp: 18  Temp: 98 F (36.7 C)  TempSrc: Oral  SpO2: 97%  Weight: 230 lb 9.6 oz (104.6 kg)   Physical Exam Vitals and nursing  note reviewed.  Constitutional:      Appearance: She is well-developed. She is not ill-appearing or toxic-appearing.  HENT:     Head: Normocephalic and atraumatic.     Comments: No erythema to oropharynx, no edema, no exudate, no tonsillar swelling, voice normal, neck supple without lymphadenopathy      Right Ear: External ear normal.     Left Ear: External ear normal.     Nose: Nose normal.  Eyes:     General: No scleral icterus.       Right eye: No discharge.        Left eye: No discharge.     Conjunctiva/sclera: Conjunctivae normal.  Neck:     Thyroid: No thyromegaly.     Vascular: No JVD.     Trachea: Trachea and phonation normal.  Cardiovascular:     Rate and Rhythm: Normal rate and regular rhythm.     Pulses: Normal pulses.          Radial pulses are 2+ on the right side and 2+ on the left side.     Heart sounds: Normal heart sounds.  Pulmonary:     Effort: Pulmonary effort is normal.     Breath sounds: Normal breath sounds.  Chest:     Comments: Port in left chest without tenderness. No signs of surrounding infection. Abdominal:     General: There is no distension.  Musculoskeletal:        General: Normal range of motion.     Cervical back: Normal range of motion. No muscular tenderness.     Right lower leg: No edema.     Left lower leg: No edema.     Comments: Full ROM of all extremities. Compartments soft in all extremities. Equal tactile temperature in all extremities.  Lymphadenopathy:     Head:     Right side of head: No submental adenopathy.     Left side of head: No submental adenopathy.     Cervical:     Right cervical: No superficial cervical adenopathy.    Left cervical:  No superficial cervical adenopathy.     Upper Body:     Right upper body: No supraclavicular adenopathy.     Left upper body: No supraclavicular adenopathy.  Skin:    General: Skin is warm and dry.  Neurological:     Mental Status: She is oriented to person, place, and time.      GCS: GCS eye subscore is 4. GCS verbal subscore is 5. GCS motor subscore is 6.     Comments: Fluent speech, no facial droop.  Psychiatric:        Behavior: Behavior normal.       LABORATORY DATA: I have reviewed the data as listed CBC Latest Ref Rng & Units 05/19/2021 05/12/2021 05/05/2021  WBC 4.0 - 10.5 K/uL 6.0 5.3 7.0  Hemoglobin 12.0 - 15.0 g/dL 9.1(L) 8.7(L) 8.4(L)  Hematocrit 36.0 - 46.0 % 28.2(L) 26.9(L) 25.9(L)  Platelets 150 - 400 K/uL 252 340 355     CMP Latest Ref Rng & Units 05/19/2021 05/12/2021 05/05/2021  Glucose 70 - 99 mg/dL 270(H) 202(H) 177(H)  BUN 6 - 20 mg/dL '11 10 11  ' Creatinine 0.44 - 1.00 mg/dL 0.83 0.80 0.78  Sodium 135 - 145 mmol/L 138 138 140  Potassium 3.5 - 5.1 mmol/L 4.1 3.9 4.1  Chloride 98 - 111 mmol/L 105 106 107  CO2 22 - 32 mmol/L '24 22 24  ' Calcium 8.9 - 10.3 mg/dL 9.4 9.2 9.1  Total Protein 6.5 - 8.1 g/dL 7.9 7.7 7.4  Total Bilirubin 0.3 - 1.2 mg/dL 0.3 0.3 <0.2(L)  Alkaline Phos 38 - 126 U/L 83 77 73  AST 15 - 41 U/L '17 18 24  ' ALT 0 - 44 U/L '25 26 29       ' RADIOGRAPHIC STUDIES: I have personally reviewed the radiological images as listed and agreed with the findings in the report. No images are attached to the encounter. VAS [place] UPPER EXTREMITY VENOUS DUPLEX  Result Date: 05/20/2021 UPPER VENOUS STUDY  Patient Name:  Sandra Bishop  Date of Exam:   05/20/2021 Medical Rec #: [id number]         Accession #:    [id number] Date of Birth: 1971-01-18        Patient Gender: F Patient Age:   36 years Exam Location[facility]  [facility] Procedure:      VAS [place] UPPER EXTREMITY VENOUS DUPLEX Referring Phys: Mendel Ryder CAUSEY --------------------------------------------------------------------------------  Indications: Swelling Risk Factors: Cancer. Comparison Study: No prior studies. Performing Technologist: Oliver Hum RVT  Examination Guidelines: A complete evaluation includes B-mode imaging, spectral Doppler, color Doppler, and power Doppler as  needed of all accessible portions of each vessel. Bilateral testing is considered an integral part of a complete examination. Limited examinations for reoccurring indications may be performed as noted.  Right Findings: +----------+------------+---------+-----------+----------+-------+ RIGHT     CompressiblePhasicitySpontaneousPropertiesSummary +----------+------------+---------+-----------+----------+-------+ Subclavian    Full       Yes       Yes                      +----------+------------+---------+-----------+----------+-------+  Left Findings: +----------+------------+---------+-----------+----------+-------+ LEFT      CompressiblePhasicitySpontaneousPropertiesSummary +----------+------------+---------+-----------+----------+-------+ IJV           Full       Yes       Yes                      +----------+------------+---------+-----------+----------+-------+ Subclavian    Full       Yes  Yes                      +----------+------------+---------+-----------+----------+-------+ Axillary      Full       Yes       Yes                      +----------+------------+---------+-----------+----------+-------+ Brachial      Full       Yes       Yes                      +----------+------------+---------+-----------+----------+-------+ Radial        Full                                          +----------+------------+---------+-----------+----------+-------+ Ulnar         Full                                          +----------+------------+---------+-----------+----------+-------+ Cephalic      Full                                          +----------+------------+---------+-----------+----------+-------+ Basilic       Full                                          +----------+------------+---------+-----------+----------+-------+  Summary:  Right: No evidence of thrombosis in the subclavian.  Left: No evidence of deep vein thrombosis  in the upper extremity. No evidence of superficial vein thrombosis in the upper extremity.  *See table(s) above for measurements and observations.    Preliminary      ASSESSMENT & PLAN: Patient is a 50 y.o. female with history of malignant neoplasm of upper-outer quadrant of right breast, estrogen receptor positive on weekly Taxol followed by oncologist Dr. Lindi Adie.  #)Left neck swelling- no swelling seen on exam.  Lymph nodes feel normal.  Exam is unremarkable overall.  She is afebrile with stable vital signs.  No swelling noted to left upper extremity.  Patient had ultrasound prior to arrival.  I viewed prelim report and there is no evidence of deep vein thrombosis in left upper extremity.  No evidence of superficial vein thrombosis.  No evidence of thrombosis and subclavian.  Discussed results with patient.  She admits that ever since starting azithromycin the swelling has improved. Encouraged patient to continue to monitor for swelling of neck and lymph nodes. She knows to return or see oncologist if swelling continues or worsens for recheck.  #) malignant neoplasm of upper-outer quadrant of right breast, estrogen receptor-continue treatment per primary oncologist..   Visit Diagnosis: 1. Neck swelling   2. Malignant neoplasm of upper-outer quadrant of right breast in female, estrogen receptor positive ([place])      No orders of the defined types were placed in this encounter.   All questions were answered. The patient knows to call the clinic with any problems, questions or concerns. No barriers to learning was detected.  I have spent a total of 10  minutes minutes of face-to-face and non-face-to-face time, preparing to see the patient, obtaining and/or reviewing separately obtained history, performing a medically appropriate examination, counseling and educating the patient, ordering tests,  documenting clinical information in the electronic health record, and care coordination.     Thank you  for allowing me to participate in the care of this patient.    Barrie Folk, PA-C Department of Hematology/Oncology [facility] Phone: [phone]  [phone]    05/20/2021 10:57 AM

## 2021-05-20 NOTE — Progress Notes (Signed)
Left upper extremity venous duplex has been completed. Preliminary results can be found in CV Proc through chart review.  Results were given to Wilber Bihari NP.  05/20/21 8:35 AM Carlos Levering RVT

## 2021-05-25 ENCOUNTER — Encounter: Payer: Self-pay | Admitting: *Deleted

## 2021-05-26 ENCOUNTER — Other Ambulatory Visit: Payer: Self-pay

## 2021-05-26 ENCOUNTER — Inpatient Hospital Stay: Payer: BC Managed Care – PPO

## 2021-05-26 VITALS — BP 141/85 | HR 99 | Temp 98.3°F | Resp 18

## 2021-05-26 DIAGNOSIS — Z170 Estrogen receptor positive status [ER+]: Secondary | ICD-10-CM

## 2021-05-26 DIAGNOSIS — Z95828 Presence of other vascular implants and grafts: Secondary | ICD-10-CM

## 2021-05-26 DIAGNOSIS — Z5111 Encounter for antineoplastic chemotherapy: Secondary | ICD-10-CM | POA: Diagnosis not present

## 2021-05-26 LAB — COMPREHENSIVE METABOLIC PANEL
ALT: 27 U/L (ref 0–44)
AST: 20 U/L (ref 15–41)
Albumin: 3.7 g/dL (ref 3.5–5.0)
Alkaline Phosphatase: 73 U/L (ref 38–126)
Anion gap: 9 (ref 5–15)
BUN: 10 mg/dL (ref 6–20)
CO2: 24 mmol/L (ref 22–32)
Calcium: 9.3 mg/dL (ref 8.9–10.3)
Chloride: 106 mmol/L (ref 98–111)
Creatinine, Ser: 0.81 mg/dL (ref 0.44–1.00)
GFR, Estimated: >60 mL/min (ref >60)
Glucose, Bld: 184 mg/dL — ABNORMAL HIGH (ref 70–99)
Potassium: 4 mmol/L (ref 3.5–5.1)
Sodium: 139 mmol/L (ref 135–145)
Total Bilirubin: 0.4 mg/dL (ref 0.3–1.2)
Total Protein: 7.8 g/dL (ref 6.5–8.1)

## 2021-05-26 LAB — CBC WITH DIFFERENTIAL/PLATELET
Abs Immature Granulocytes: 0.03 10*3/uL (ref 0.00–0.07)
Basophils Absolute: 0 10*3/uL (ref 0.0–0.1)
Basophils Relative: 0 %
Eosinophils Absolute: 0.1 10*3/uL (ref 0.0–0.5)
Eosinophils Relative: 1 %
HCT: 28.5 % — ABNORMAL LOW (ref 36.0–46.0)
Hemoglobin: 9.3 g/dL — ABNORMAL LOW (ref 12.0–15.0)
Immature Granulocytes: 1 %
Lymphocytes Relative: 21 %
Lymphs Abs: 1.1 10*3/uL (ref 0.7–4.0)
MCH: 30 pg (ref 26.0–34.0)
MCHC: 32.6 g/dL (ref 30.0–36.0)
MCV: 91.9 fL (ref 80.0–100.0)
Monocytes Absolute: 0.6 10*3/uL (ref 0.1–1.0)
Monocytes Relative: 11 %
Neutro Abs: 3.7 10*3/uL (ref 1.7–7.7)
Neutrophils Relative %: 66 %
Platelets: 270 10*3/uL (ref 150–400)
RBC: 3.1 MIL/uL — ABNORMAL LOW (ref 3.87–5.11)
RDW: 21.7 % — ABNORMAL HIGH (ref 11.5–15.5)
WBC: 5.5 10*3/uL (ref 4.0–10.5)
nRBC: 0 % (ref 0.0–0.2)

## 2021-05-26 MED ORDER — SODIUM CHLORIDE 0.9% FLUSH
10.0000 mL | INTRAVENOUS | Status: DC | PRN
  Administered 2021-05-26: 10 mL

## 2021-05-26 MED ORDER — SODIUM CHLORIDE 0.9 % IV SOLN: Freq: Once | INTRAVENOUS | Status: AC

## 2021-05-26 MED ORDER — ONDANSETRON HCL 4 MG/2ML IJ SOLN
8.0000 mg | Freq: Once | INTRAMUSCULAR | Status: AC
  Administered 2021-05-26: 8 mg via INTRAVENOUS
  Filled 2021-05-26: qty 4

## 2021-05-26 MED ORDER — SODIUM CHLORIDE 0.9% FLUSH
10.0000 mL | Freq: Once | INTRAVENOUS | Status: AC
  Administered 2021-05-26: 10 mL

## 2021-05-26 MED ORDER — HEPARIN SOD (PORK) LOCK FLUSH 100 UNIT/ML IV SOLN
500.0000 [IU] | Freq: Once | INTRAVENOUS | Status: AC | PRN
  Administered 2021-05-26: 500 [IU]

## 2021-05-26 MED ORDER — FAMOTIDINE 20 MG IN NS 100 ML IVPB
20.0000 mg | Freq: Once | INTRAVENOUS | Status: AC
  Administered 2021-05-26: 20 mg via INTRAVENOUS
  Filled 2021-05-26: qty 100

## 2021-05-26 MED ORDER — SODIUM CHLORIDE 0.9 % IV SOLN
80.0000 mg/m2 | Freq: Once | INTRAVENOUS | Status: AC
  Administered 2021-05-26: 174 mg via INTRAVENOUS
  Filled 2021-05-26: qty 29

## 2021-05-26 NOTE — Patient Instructions (Signed)
[facility]  Discharge Instructions: Thank you for choosing [facility] to provide your oncology and hematology care.   If you have a lab appointment with the [facility], please go directly to the [facility] and check in at the registration area.   Wear comfortable clothing and clothing appropriate for easy access to any Portacath or PICC line.   We strive to give you quality time with your provider. You may need to reschedule your appointment if you arrive late (15 or more minutes).  Arriving late affects you and other patients whose appointments are after yours.  Also, if you miss three or more appointments without notifying the office, you may be dismissed from the clinic at the provider's discretion.      For prescription refill requests, have your pharmacy contact our office and allow 72 hours for refills to be completed.    Today you received the following chemotherapy and/or immunotherapy agents paclitaxol      To help prevent nausea and vomiting after your treatment, we encourage you to take your nausea medication as directed.  BELOW ARE SYMPTOMS THAT SHOULD BE REPORTED IMMEDIATELY: *FEVER GREATER THAN 100.4 F (38 C) OR HIGHER *CHILLS OR SWEATING *NAUSEA AND VOMITING THAT IS NOT CONTROLLED WITH YOUR NAUSEA MEDICATION *UNUSUAL SHORTNESS OF BREATH *UNUSUAL BRUISING OR BLEEDING *URINARY PROBLEMS (pain or burning when urinating, or frequent urination) *BOWEL PROBLEMS (unusual diarrhea, constipation, pain near the anus) TENDERNESS IN MOUTH AND THROAT WITH OR WITHOUT PRESENCE OF ULCERS (sore throat, sores in mouth, or a toothache) UNUSUAL RASH, SWELLING OR PAIN  UNUSUAL VAGINAL DISCHARGE OR ITCHING   Items with * indicate a potential emergency and should be followed up as soon as possible or go to the Emergency Department if any problems should occur.  Please show the CHEMOTHERAPY ALERT CARD or IMMUNOTHERAPY ALERT CARD at check-in to  the Emergency Department and triage nurse.  Should you have questions after your visit or need to cancel or reschedule your appointment, please contact [facility]  Dept: [phone]  and follow the prompts.  Office hours are 8:00 a.m. to 4:30 p.m. Monday - Friday. Please note that voicemails left after 4:00 p.m. may not be returned until the following business day.  We are closed weekends and major holidays. You have access to a nurse at all times for urgent questions. Please call the main number to the clinic Dept: [phone] and follow the prompts.   For any non-urgent questions, you may also contact your provider using MyChart. We now offer e-Visits for anyone 34 and older to request care online for non-urgent symptoms. For details visit [email or web address].   Also download the MyChart app! Go to the app store, search "MyChart", open the app, select [facility], and log in with your MyChart username and password.  Due to Covid, a mask is required upon entering the hospital/clinic. If you do not have a mask, one will be given to you upon arrival. For doctor visits, patients may have 1 support person aged 23 or older with them. For treatment visits, patients cannot have anyone with them due to current Covid guidelines and our immunocompromised population.

## 2021-06-01 NOTE — Progress Notes (Signed)
Patient Care Team: [name], MD as PCP - General (Family Medicine) [name], RN as Oncology Nurse Navigator [name], RN as Oncology Nurse Navigator [name], MD as Consulting Physician (General Surgery) [name], MD as Consulting Physician (Hematology and Oncology) [name], MD as Consulting Physician (Radiation Oncology)  DIAGNOSIS:    ICD-10-CM   1. Malignant neoplasm of upper-outer quadrant of right breast in female, estrogen receptor positive ([place])  C50.411    Z17.0       SUMMARY OF ONCOLOGIC HISTORY: Oncology History  Malignant neoplasm of upper-outer quadrant of right breast in female, estrogen receptor positive ([place])  02/15/2021 Initial Diagnosis   Screening mammogram: a possible mass in the right breast  Diagnostic mammogram and [place]: a persistent irregular mass 1.2 cm within the upper-outer right breast. 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    02/23/2021 Cancer Staging   Staging form: Breast, AJCC 8th Edition - Clinical stage from 02/23/2021: Stage IIB (cT1c, cN1(f), cM0, G3, ER+, PR-, HER2-) - Signed by [name], MD on 02/23/2021 Stage prefix: Initial diagnosis Method of lymph node assessment: Core biopsy Histologic grading system: 3 grade system    03/04/2021 -  Chemotherapy   Patient is on Treatment Plan : BREAST ADJUVANT DOSE DENSE AC q14d / PACLitaxel q7d      Genetic Testing   Negative genetic testing. No pathogenic variants identified on the [facility] CancerNext-Expanded+RNA panel. The report date is 03/10/2021.  The CancerNext-Expanded + RNAinsight gene panel offered by [employer] and includes sequencing and rearrangement analysis for the following 77 genes: IP, ALK, APC*, ATM*, AXIN2, BAP1, BARD1, BLM, BMPR1A, BRCA1*, BRCA2*, BRIP1*, CDC73, CDH1*,CDK4, CDKN1B, CDKN2A, CHEK2*, CTNNA1, DICER1, FANCC, FH, FLCN, GALNT12, KIF1B, LZTR1, MAX, MEN1,  MET, MLH1*, MSH2*, MSH3, MSH6*, MUTYH*, NBN, NF1*, NF2, NTHL1, PALB2*, PHOX2B, PMS2*, POT1, PRKAR1A, PTCH1, PTEN*, RAD51C*, RAD51D*,RB1, RECQL, RET, SDHA, SDHAF2, SDHB, SDHC, SDHD, SMAD4, SMARCA4, SMARCB1, SMARCE1, STK11, SUFU, TMEM127, TP53*,TSC1, TSC2, VHL and XRCC2 (sequencing and deletion/duplication); EGFR, EGLN1, HOXB13, KIT, MITF, PDGFRA, POLD1 and POLE (sequencing only); EPCAM and GREM1 (deletion/duplication only).     CHIEF COMPLIANT: Cycle 6 Taxol  INTERVAL HISTORY: Sandra Bishop is a 50 y.o. with above-mentioned history of  invasive ductal carcinoma of the right breast having undergone chemotherapy with dose dense Adriamycin and Cytoxan, currently on chemotherapy with Taxol. She presents to the clinic today for treatment.   ALLERGIES:  has No Known Allergies.  MEDICATIONS:  Current Outpatient Medications  Medication Sig Dispense Refill   aspirin EC 81 MG tablet Take 81 mg by mouth daily. Swallow whole.     azithromycin (ZITHROMAX Z-PAK) 250 MG tablet Take as directed 6 each 0   blood glucose meter kit and supplies Dispense based on patient and insurance preference. Use up to four times daily as directed. (FOR ICD-10 E10.9, E11.9).  ( One-Touch Verio meter) 1 each 0   Cholecalciferol (VITAMIN D3) 50 MCG (2000 UT) TABS Take 2,000 Units by mouth daily.     dicyclomine (BENTYL) 10 MG capsule TAKE 1 CAPSULE(10 MG) BY MOUTH FOUR TIMES DAILY BEFORE MEALS AND AT BEDTIME 14 capsule 0   fluconazole (DIFLUCAN) 100 MG tablet Take 1 tablet (100 mg total) by mouth daily. Take one tablet today 03/25/21 and second tablet on 03/27/21. 2 tablet 0   Lancets (ONETOUCH ULTRASOFT) lancets Use to check blood glucose three times daily. Use as instructed. E11.9. 100 each 12  lidocaine-prilocaine (EMLA) cream Apply to affected area once 30 g 3   nystatin-triamcinolone ointment (MYCOLOG) Apply 1 application topically 2 (two) times daily. 30 g 0   ondansetron (ZOFRAN ODT) 8 MG disintegrating tablet Take 1  tablet (8 mg total) by mouth every 8 (eight) hours as needed for nausea or vomiting. 20 tablet 0   ondansetron (ZOFRAN) 8 MG tablet Take 1 tablet (8 mg total) by mouth 2 (two) times daily as needed. Start on the third day after chemotherapy. 30 tablet 1   ONETOUCH VERIO test strip TEST FOUR TIMES DAILY AS DIRECTED 100 strip 2   prochlorperazine (COMPAZINE) 10 MG tablet Take 1 tablet (10 mg total) by mouth every 6 (six) hours as needed (Nausea or vomiting). 30 tablet 1   rosuvastatin (CRESTOR) 5 MG tablet Take 5 mg by mouth every Tuesday.     Semaglutide (RYBELSUS) 3 MG TABS Take 3 mg by mouth daily before breakfast.     traZODone (DESYREL) 50 MG tablet Take 1 tablet (50 mg total) by mouth at bedtime.     Turmeric 450 MG CAPS Take 900 mg by mouth daily.     No current facility-administered medications for this visit.    PHYSICAL EXAMINATION: ECOG PERFORMANCE STATUS: 1 - Symptomatic but completely ambulatory  Vitals:   06/02/21 1038  BP: (!) 156/81  Pulse: (!) 109  Resp: 18  Temp: (!) 97.3 F (36.3 C)  SpO2: 100%   Filed Weights   06/02/21 1038  Weight: 228 lb 11.2 oz (103.7 kg)    LABORATORY DATA:  I have reviewed the data as listed CMP Latest Ref Rng & Units 05/26/2021 05/19/2021 05/12/2021  Glucose 70 - 99 mg/dL 184(H) 270(H) 202(H)  BUN 6 - 20 mg/dL _0 Creatinine 0.44 - 1.00 mg/dL 0.81 0.83 0.80  Sodium 135 - 145 mmol/L 139 138 138  Potassium 3.5 - 5.1 mmol/L 4.0 4.1 3.9  Chloride 98 - 111 mmol/L 106 105 106  CO2 22 - 32 mmol/L _1 Calcium 8.9 - 10.3 mg/dL 9.3 9.4 9.2  Total Protein 6.5 - 8.1 g/dL 7.8 7.9 7.7  Total Bilirubin 0.3 - 1.2 mg/dL 0.4 0.3 0.3  Alkaline Phos 38 - 126 U/L 73 83 77  AST 15 - 41 U/L _2 ALT 0 - 44 U/L _3 Lab Results  Component Value Date   WBC 5.5 06/02/2021   HGB 9.4 (L) 06/02/2021   HCT 29.3 (L) 06/02/2021   MCV 93.0 06/02/2021   PLT 307 06/02/2021   NEUTROABS 3.5 06/02/2021    ASSESSMENT & PLAN:   Malignant neoplasm of upper-outer quadrant of right breast in female, estrogen receptor positive ([place]) 02/15/2021:Screening mammogram: a possible mass in the right breast  Diagnostic mammogram and [place]: a persistent irregular mass 1.2 cm within the upper-outer right breast. 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    Recommendation based on multidisciplinary tumor board: 1. Neoadjuvant chemotherapy with Adriamycin and Cytoxan dose dense 4 followed by Taxol weekly 12  2. Followed by breast conserving surgery with  targeted axillary dissection 3. Followed by adjuvant radiation therapy 4.  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   Chemo toxicities Severe restless legs: I sent a prescription for magnesium.  We also reduce the dosage of Taxol. If the symptoms continue to get worse then we may have to stop her treatment. Chemo induced anemia: Hemoglobin is steady at 9.4.  Return to clinic weekly for Taxol treatments. If you end up stopping her chemo sooner than she will need MRI and surgery evaluations. No orders of the defined types were  placed in this encounter.  The patient has a good understanding of the overall plan. she agrees with it. she will call with any problems that may develop before the next visit here.  Total time spent: 30 mins including face to face time and time spent for planning, charting and coordination of care  Rulon Eisenmenger, MD, MPH 06/02/2021  I, Thana Ates, am acting as scribe for Dr. [name].  I have reviewed the above documentation for accuracy and completeness, and I agree with the above.

## 2021-06-02 ENCOUNTER — Inpatient Hospital Stay (HOSPITAL_BASED_OUTPATIENT_CLINIC_OR_DEPARTMENT_OTHER): Payer: BC Managed Care – PPO | Admitting: Hematology and Oncology

## 2021-06-02 ENCOUNTER — Inpatient Hospital Stay: Payer: BC Managed Care – PPO

## 2021-06-02 ENCOUNTER — Other Ambulatory Visit: Payer: Self-pay

## 2021-06-02 VITALS — HR 100

## 2021-06-02 DIAGNOSIS — C50411 Malignant neoplasm of upper-outer quadrant of right female breast: Secondary | ICD-10-CM

## 2021-06-02 DIAGNOSIS — Z5111 Encounter for antineoplastic chemotherapy: Secondary | ICD-10-CM | POA: Diagnosis not present

## 2021-06-02 DIAGNOSIS — Z170 Estrogen receptor positive status [ER+]: Secondary | ICD-10-CM | POA: Diagnosis not present

## 2021-06-02 LAB — CBC WITH DIFFERENTIAL/PLATELET
Abs Immature Granulocytes: 0.04 10*3/uL (ref 0.00–0.07)
Basophils Absolute: 0 10*3/uL (ref 0.0–0.1)
Basophils Relative: 0 %
Eosinophils Absolute: 0.1 10*3/uL (ref 0.0–0.5)
Eosinophils Relative: 1 %
HCT: 29.3 % — ABNORMAL LOW (ref 36.0–46.0)
Hemoglobin: 9.4 g/dL — ABNORMAL LOW (ref 12.0–15.0)
Immature Granulocytes: 1 %
Lymphocytes Relative: 21 %
Lymphs Abs: 1.2 10*3/uL (ref 0.7–4.0)
MCH: 29.8 pg (ref 26.0–34.0)
MCHC: 32.1 g/dL (ref 30.0–36.0)
MCV: 93 fL (ref 80.0–100.0)
Monocytes Absolute: 0.8 10*3/uL (ref 0.1–1.0)
Monocytes Relative: 14 %
Neutro Abs: 3.5 10*3/uL (ref 1.7–7.7)
Neutrophils Relative %: 63 %
Platelets: 307 10*3/uL (ref 150–400)
RBC: 3.15 MIL/uL — ABNORMAL LOW (ref 3.87–5.11)
RDW: 19.9 % — ABNORMAL HIGH (ref 11.5–15.5)
WBC: 5.5 10*3/uL (ref 4.0–10.5)
nRBC: 0 % (ref 0.0–0.2)

## 2021-06-02 LAB — COMPREHENSIVE METABOLIC PANEL
ALT: 22 U/L (ref 0–44)
AST: 18 U/L (ref 15–41)
Albumin: 3.7 g/dL (ref 3.5–5.0)
Alkaline Phosphatase: 90 U/L (ref 38–126)
Anion gap: 8 (ref 5–15)
BUN: 11 mg/dL (ref 6–20)
CO2: 24 mmol/L (ref 22–32)
Calcium: 8.9 mg/dL (ref 8.9–10.3)
Chloride: 106 mmol/L (ref 98–111)
Creatinine, Ser: 0.79 mg/dL (ref 0.44–1.00)
GFR, Estimated: >60 mL/min (ref >60)
Glucose, Bld: 182 mg/dL — ABNORMAL HIGH (ref 70–99)
Potassium: 3.7 mmol/L (ref 3.5–5.1)
Sodium: 138 mmol/L (ref 135–145)
Total Bilirubin: <0.2 mg/dL — ABNORMAL LOW (ref 0.3–1.2)
Total Protein: 7.8 g/dL (ref 6.5–8.1)

## 2021-06-02 MED ORDER — HEPARIN SOD (PORK) LOCK FLUSH 100 UNIT/ML IV SOLN
500.0000 [IU] | Freq: Once | INTRAVENOUS | Status: AC | PRN
  Administered 2021-06-02: 14:00:00 500 [IU]

## 2021-06-02 MED ORDER — MAGNESIUM OXIDE -MG SUPPLEMENT 400 (240 MG) MG PO TABS: 400.0000 mg | ORAL_TABLET | Freq: Every day | ORAL | 2 refills | Status: DC

## 2021-06-02 MED ORDER — SODIUM CHLORIDE 0.9 % IV SOLN
50.0000 mg/m2 | Freq: Once | INTRAVENOUS | Status: AC
  Administered 2021-06-02: 13:00:00 108 mg via INTRAVENOUS
  Filled 2021-06-02: qty 18

## 2021-06-02 MED ORDER — SODIUM CHLORIDE 0.9% FLUSH
10.0000 mL | INTRAVENOUS | Status: DC | PRN
  Administered 2021-06-02: 14:00:00 10 mL

## 2021-06-02 MED ORDER — FAMOTIDINE 20 MG IN NS 100 ML IVPB
20.0000 mg | Freq: Once | INTRAVENOUS | Status: AC
  Administered 2021-06-02: 12:00:00 20 mg via INTRAVENOUS
  Filled 2021-06-02: qty 100

## 2021-06-02 MED ORDER — SODIUM CHLORIDE 0.9 % IV SOLN: Freq: Once | INTRAVENOUS | Status: AC

## 2021-06-02 MED ORDER — ONDANSETRON HCL 4 MG/2ML IJ SOLN
8.0000 mg | Freq: Once | INTRAMUSCULAR | Status: AC
  Administered 2021-06-02: 12:00:00 8 mg via INTRAVENOUS
  Filled 2021-06-02: qty 4

## 2021-06-02 NOTE — Patient Instructions (Signed)
[facility]  Discharge Instructions: Thank you for choosing [facility] to provide your oncology and hematology care.   If you have a lab appointment with the [facility], please go directly to the [facility] and check in at the registration area.   Wear comfortable clothing and clothing appropriate for easy access to any Portacath or PICC line.   We strive to give you quality time with your provider. You may need to reschedule your appointment if you arrive late (15 or more minutes).  Arriving late affects you and other patients whose appointments are after yours.  Also, if you miss three or more appointments without notifying the office, you may be dismissed from the clinic at the provider's discretion.      For prescription refill requests, have your pharmacy contact our office and allow 72 hours for refills to be completed.    Today you received the following chemotherapy and/or immunotherapy agents Taxol      To help prevent nausea and vomiting after your treatment, we encourage you to take your nausea medication as directed.  BELOW ARE SYMPTOMS THAT SHOULD BE REPORTED IMMEDIATELY: *FEVER GREATER THAN 100.4 F (38 C) OR HIGHER *CHILLS OR SWEATING *NAUSEA AND VOMITING THAT IS NOT CONTROLLED WITH YOUR NAUSEA MEDICATION *UNUSUAL SHORTNESS OF BREATH *UNUSUAL BRUISING OR BLEEDING *URINARY PROBLEMS (pain or burning when urinating, or frequent urination) *BOWEL PROBLEMS (unusual diarrhea, constipation, pain near the anus) TENDERNESS IN MOUTH AND THROAT WITH OR WITHOUT PRESENCE OF ULCERS (sore throat, sores in mouth, or a toothache) UNUSUAL RASH, SWELLING OR PAIN  UNUSUAL VAGINAL DISCHARGE OR ITCHING   Items with * indicate a potential emergency and should be followed up as soon as possible or go to the Emergency Department if any problems should occur.  Please show the CHEMOTHERAPY ALERT CARD or IMMUNOTHERAPY ALERT CARD at check-in to the  Emergency Department and triage nurse.  Should you have questions after your visit or need to cancel or reschedule your appointment, please contact [facility]  Dept: [phone]  and follow the prompts.  Office hours are 8:00 a.m. to 4:30 p.m. Monday - Friday. Please note that voicemails left after 4:00 p.m. may not be returned until the following business day.  We are closed weekends and major holidays. You have access to a nurse at all times for urgent questions. Please call the main number to the clinic Dept: [phone] and follow the prompts.   For any non-urgent questions, you may also contact your provider using MyChart. We now offer e-Visits for anyone 61 and older to request care online for non-urgent symptoms. For details visit [email or web address].   Also download the MyChart app! Go to the app store, search "MyChart", open the app, select [facility], and log in with your MyChart username and password.  Due to Covid, a mask is required upon entering the hospital/clinic. If you do not have a mask, one will be given to you upon arrival. For doctor visits, patients may have 1 support person aged 93 or older with them. For treatment visits, patients cannot have anyone with them due to current Covid guidelines and our immunocompromised population.

## 2021-06-02 NOTE — Assessment & Plan Note (Signed)
02/15/2021:Screening mammogram: a possible mass in the right breast Diagnostic mammogram and [place]: a persistent irregular mass 1.2 cm within the upper-outer right breast. 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  Recommendationbased on multidisciplinary tumor board: 1. Neoadjuvant chemotherapy with Adriamycin and Cytoxan dose dense 4 followed by Taxol weekly 12  2. Followed by breast conserving surgery with targeted axillary dissection 3. 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 Monitoring closely for peripheral neuropathy leg pain

## 2021-06-15 NOTE — Progress Notes (Signed)
Patient Care Team: [name], MD as PCP - General (Family Medicine) [name], RN as Oncology Nurse Navigator [name], RN as Oncology Nurse Navigator [name], MD as Consulting Physician (General Surgery) [name], MD as Consulting Physician (Hematology and Oncology) [name], MD as Consulting Physician (Radiation Oncology)  DIAGNOSIS:    ICD-10-CM   1. Malignant neoplasm of upper-outer quadrant of right breast in female, estrogen receptor positive ([place])  C50.411    Z17.0       SUMMARY OF ONCOLOGIC HISTORY: Oncology History  Malignant neoplasm of upper-outer quadrant of right breast in female, estrogen receptor positive ([place])  02/15/2021 Initial Diagnosis   Screening mammogram: a possible mass in the right breast  Diagnostic mammogram and [place]: a persistent irregular mass 1.2 cm within the upper-outer right breast. 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    02/23/2021 Cancer Staging   Staging form: Breast, AJCC 8th Edition - Clinical stage from 02/23/2021: Stage IIB (cT1c, cN1(f), cM0, G3, ER+, PR-, HER2-) - Signed by [name], MD on 02/23/2021 Stage prefix: Initial diagnosis Method of lymph node assessment: Core biopsy Histologic grading system: 3 grade system    03/04/2021 -  Chemotherapy   Patient is on Treatment Plan : BREAST ADJUVANT DOSE DENSE AC q14d / PACLitaxel q7d      Genetic Testing   Negative genetic testing. No pathogenic variants identified on the [facility] CancerNext-Expanded+RNA panel. The report date is 03/10/2021.  The CancerNext-Expanded + RNAinsight gene panel offered by [employer] and includes sequencing and rearrangement analysis for the following 77 genes: IP, ALK, APC*, ATM*, AXIN2, BAP1, BARD1, BLM, BMPR1A, BRCA1*, BRCA2*, BRIP1*, CDC73, CDH1*,CDK4, CDKN1B, CDKN2A, CHEK2*, CTNNA1, DICER1, FANCC, FH, FLCN, GALNT12, KIF1B, LZTR1, MAX, MEN1,  MET, MLH1*, MSH2*, MSH3, MSH6*, MUTYH*, NBN, NF1*, NF2, NTHL1, PALB2*, PHOX2B, PMS2*, POT1, PRKAR1A, PTCH1, PTEN*, RAD51C*, RAD51D*,RB1, RECQL, RET, SDHA, SDHAF2, SDHB, SDHC, SDHD, SMAD4, SMARCA4, SMARCB1, SMARCE1, STK11, SUFU, TMEM127, TP53*,TSC1, TSC2, VHL and XRCC2 (sequencing and deletion/duplication); EGFR, EGLN1, HOXB13, KIT, MITF, PDGFRA, POLD1 and POLE (sequencing only); EPCAM and GREM1 (deletion/duplication only).     CHIEF COMPLIANT: Cycle 8 Taxol  INTERVAL HISTORY: Sandra Bishop is a 50 y.o. with above-mentioned history of invasive ductal carcinoma of the right breast having undergone chemotherapy with dose dense Adriamycin and Cytoxan, currently on chemotherapy with Taxol. She presents to the clinic today for treatment.   ALLERGIES:  has No Known Allergies.  MEDICATIONS:  Current Outpatient Medications  Medication Sig Dispense Refill   aspirin EC 81 MG tablet Take 81 mg by mouth daily. Swallow whole.     blood glucose meter kit and supplies Dispense based on patient and insurance preference. Use up to four times daily as directed. (FOR ICD-10 E10.9, E11.9).  ( One-Touch Verio meter) 1 each 0   Cholecalciferol (VITAMIN D3) 50 MCG (2000 UT) TABS Take 2,000 Units by mouth daily.     dicyclomine (BENTYL) 10 MG capsule TAKE 1 CAPSULE(10 MG) BY MOUTH FOUR TIMES DAILY BEFORE MEALS AND AT BEDTIME 14 capsule 0   fluconazole (DIFLUCAN) 100 MG tablet Take 1 tablet (100 mg total) by mouth daily. Take one tablet today 03/25/21 and second tablet on 03/27/21. 2 tablet 0   Lancets (ONETOUCH ULTRASOFT) lancets Use to check blood glucose three times daily. Use as instructed. E11.9. 100 each 12   lidocaine-prilocaine (EMLA) cream Apply to affected area once 30 g 3  magnesium oxide (MAG-OX) 400 (240 Mg) MG tablet Take 1 tablet (400 mg total) by mouth daily. 30 tablet 2   nystatin-triamcinolone ointment (MYCOLOG) Apply 1 application topically 2 (two) times daily. 30 g 0   ondansetron (ZOFRAN ODT) 8  MG disintegrating tablet Take 1 tablet (8 mg total) by mouth every 8 (eight) hours as needed for nausea or vomiting. 20 tablet 0   ondansetron (ZOFRAN) 8 MG tablet Take 1 tablet (8 mg total) by mouth 2 (two) times daily as needed. Start on the third day after chemotherapy. 30 tablet 1   ONETOUCH VERIO test strip TEST FOUR TIMES DAILY AS DIRECTED 100 strip 2   prochlorperazine (COMPAZINE) 10 MG tablet Take 1 tablet (10 mg total) by mouth every 6 (six) hours as needed (Nausea or vomiting). 30 tablet 1   rosuvastatin (CRESTOR) 5 MG tablet Take 5 mg by mouth every Tuesday.     Semaglutide (RYBELSUS) 3 MG TABS Take 3 mg by mouth daily before breakfast.     traZODone (DESYREL) 50 MG tablet Take 1 tablet (50 mg total) by mouth at bedtime.     Turmeric 450 MG CAPS Take 900 mg by mouth daily.     No current facility-administered medications for this visit.    PHYSICAL EXAMINATION: ECOG PERFORMANCE STATUS: 1 - Symptomatic but completely ambulatory  Vitals:   06/16/21 0905  BP: (!) 152/71  Pulse: (!) 104  Resp: 18  Temp: 97.7 F (36.5 C)  SpO2: 100%   Filed Weights   06/16/21 0905  Weight: 230 lb 8 oz (104.6 kg)     LABORATORY DATA:  I have reviewed the data as listed CMP Latest Ref Rng & Units 06/02/2021 05/26/2021 05/19/2021  Glucose 70 - 99 mg/dL 182(H) 184(H) 270(H)  BUN 6 - 20 mg/dL _0 Creatinine 0.44 - 1.00 mg/dL 0.79 0.81 0.83  Sodium 135 - 145 mmol/L 138 139 138  Potassium 3.5 - 5.1 mmol/L 3.7 4.0 4.1  Chloride 98 - 111 mmol/L 106 106 105  CO2 22 - 32 mmol/L _1 Calcium 8.9 - 10.3 mg/dL 8.9 9.3 9.4  Total Protein 6.5 - 8.1 g/dL 7.8 7.8 7.9  Total Bilirubin 0.3 - 1.2 mg/dL <0.2(L) 0.4 0.3  Alkaline Phos 38 - 126 U/L 90 73 83  AST 15 - 41 U/L _2 ALT 0 - 44 U/L _3 Lab Results  Component Value Date   WBC 5.9 06/16/2021   HGB 10.3 (L) 06/16/2021   HCT 32.4 (L) 06/16/2021   MCV 93.9 06/16/2021   PLT 217 06/16/2021   NEUTROABS PENDING  06/16/2021    ASSESSMENT & PLAN:  Malignant neoplasm of upper-outer quadrant of right breast in female, estrogen receptor positive ([place]) 02/15/2021:Screening mammogram: a possible mass in the right breast  Diagnostic mammogram and [place]: a persistent irregular mass 1.2 cm within the upper-outer right breast. 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    Recommendation based on multidisciplinary tumor board: 1. Neoadjuvant chemotherapy with Adriamycin and Cytoxan dose dense 4 followed by Taxol weekly 12  2. Followed by breast conserving surgery with  targeted axillary dissection 3. Followed by adjuvant radiation therapy 4.  Followed by adjuvant antiestrogen therapy   Breast MRI 02/28/2021: Biopsy-proven malignancy 1.8 cm right breast, additional 4.3 cm area of non-mass enhancement (multiple additional oval masses in the right breast: 6 mm, 8 mm, 8  mm, 1 cm), left breast 0.8 cm oval mass indeterminate, 1 enlarged right axillary lymph node (recommendation: Biopsy of non-mass enhancement, biopsy of 2 indeterminate right breast masses and biopsy of left breast) Left breast biopsy UIQ: Fibroadenoma with ALH Right breast biopsies x3: Benign ------------------------------------------------------------------------------------------------------------------------------------ Current treatment: Completed 4 cycles of dose dense Adriamycin and Cytoxan (she started 03/04/2021), today cycle 7 Taxol Because of her diabetes we decided to discontinue dexamethasone.   Chemo toxicities Severe restless legs: I sent a prescription for magnesium.  We also reduced the dosage of Taxol. If the symptoms continue to get worse then we may have to stop her treatment.  Today her neuropathy symptoms appear to be stable.  It is numbness of the tips of the fingers and tips of the toes only.  She has restless nights which keep her up at night but that is not  neuropathy.  Therefore we decided to continue with the 50 mg per metered square of Taxol. Chemo induced anemia: Hemoglobin improved to 10.3   Return to clinic weekly for Taxol treatments. If you end up stopping her chemo sooner, than she will need MRI and surgery evaluations.      No orders of the defined types were placed in this encounter.  The patient has a good understanding of the overall plan. she agrees with it. she will call with any problems that may develop before the next visit here.  Total time spent: 30 mins including face to face time and time spent for planning, charting and coordination of care  Rulon Eisenmenger, MD, MPH 06/16/2021  I, Thana Ates, am acting as scribe for Dr. [name].  I have reviewed the above documentation for accuracy and completeness, and I agree with the above.

## 2021-06-16 ENCOUNTER — Inpatient Hospital Stay: Payer: BC Managed Care – PPO

## 2021-06-16 ENCOUNTER — Other Ambulatory Visit: Payer: Self-pay

## 2021-06-16 ENCOUNTER — Inpatient Hospital Stay: Payer: BC Managed Care – PPO | Attending: Hematology and Oncology

## 2021-06-16 ENCOUNTER — Inpatient Hospital Stay (HOSPITAL_BASED_OUTPATIENT_CLINIC_OR_DEPARTMENT_OTHER): Payer: BC Managed Care – PPO | Admitting: Hematology and Oncology

## 2021-06-16 VITALS — BP 141/84 | HR 94

## 2021-06-16 DIAGNOSIS — G620 Drug-induced polyneuropathy: Secondary | ICD-10-CM | POA: Insufficient documentation

## 2021-06-16 DIAGNOSIS — C50411 Malignant neoplasm of upper-outer quadrant of right female breast: Secondary | ICD-10-CM

## 2021-06-16 DIAGNOSIS — Z5111 Encounter for antineoplastic chemotherapy: Secondary | ICD-10-CM | POA: Diagnosis present

## 2021-06-16 DIAGNOSIS — Z170 Estrogen receptor positive status [ER+]: Secondary | ICD-10-CM | POA: Insufficient documentation

## 2021-06-16 DIAGNOSIS — C773 Secondary and unspecified malignant neoplasm of axilla and upper limb lymph nodes: Secondary | ICD-10-CM | POA: Insufficient documentation

## 2021-06-16 DIAGNOSIS — D6481 Anemia due to antineoplastic chemotherapy: Secondary | ICD-10-CM | POA: Diagnosis not present

## 2021-06-16 DIAGNOSIS — E119 Type 2 diabetes mellitus without complications: Secondary | ICD-10-CM | POA: Insufficient documentation

## 2021-06-16 DIAGNOSIS — Z95828 Presence of other vascular implants and grafts: Secondary | ICD-10-CM

## 2021-06-16 LAB — CBC WITH DIFFERENTIAL/PLATELET
Abs Immature Granulocytes: 0.04 10*3/uL (ref 0.00–0.07)
Basophils Absolute: 0 10*3/uL (ref 0.0–0.1)
Basophils Relative: 0 %
Eosinophils Absolute: 0 10*3/uL (ref 0.0–0.5)
Eosinophils Relative: 1 %
HCT: 32.4 % — ABNORMAL LOW (ref 36.0–46.0)
Hemoglobin: 10.3 g/dL — ABNORMAL LOW (ref 12.0–15.0)
Immature Granulocytes: 1 %
Lymphocytes Relative: 13 %
Lymphs Abs: 0.8 10*3/uL (ref 0.7–4.0)
MCH: 29.9 pg (ref 26.0–34.0)
MCHC: 31.8 g/dL (ref 30.0–36.0)
MCV: 93.9 fL (ref 80.0–100.0)
Monocytes Absolute: 1.2 10*3/uL — ABNORMAL HIGH (ref 0.1–1.0)
Monocytes Relative: 20 %
Neutro Abs: 3.9 10*3/uL (ref 1.7–7.7)
Neutrophils Relative %: 65 %
Platelets: 217 10*3/uL (ref 150–400)
RBC: 3.45 MIL/uL — ABNORMAL LOW (ref 3.87–5.11)
RDW: 17.1 % — ABNORMAL HIGH (ref 11.5–15.5)
WBC: 5.9 10*3/uL (ref 4.0–10.5)
nRBC: 0 % (ref 0.0–0.2)

## 2021-06-16 LAB — COMPREHENSIVE METABOLIC PANEL
ALT: 24 U/L (ref 0–44)
AST: 21 U/L (ref 15–41)
Albumin: 3.5 g/dL (ref 3.5–5.0)
Alkaline Phosphatase: 84 U/L (ref 38–126)
Anion gap: 8 (ref 5–15)
BUN: 12 mg/dL (ref 6–20)
CO2: 23 mmol/L (ref 22–32)
Calcium: 8.7 mg/dL — ABNORMAL LOW (ref 8.9–10.3)
Chloride: 109 mmol/L (ref 98–111)
Creatinine, Ser: 0.89 mg/dL (ref 0.44–1.00)
GFR, Estimated: >60 mL/min (ref >60)
Glucose, Bld: 195 mg/dL — ABNORMAL HIGH (ref 70–99)
Potassium: 4.1 mmol/L (ref 3.5–5.1)
Sodium: 140 mmol/L (ref 135–145)
Total Bilirubin: 0.3 mg/dL (ref 0.3–1.2)
Total Protein: 7.7 g/dL (ref 6.5–8.1)

## 2021-06-16 MED ORDER — SODIUM CHLORIDE 0.9 % IV SOLN: Freq: Once | INTRAVENOUS | Status: AC

## 2021-06-16 MED ORDER — HEPARIN SOD (PORK) LOCK FLUSH 100 UNIT/ML IV SOLN
500.0000 [IU] | Freq: Once | INTRAVENOUS | Status: AC | PRN
  Administered 2021-06-16: 500 [IU]

## 2021-06-16 MED ORDER — FAMOTIDINE 20 MG IN NS 100 ML IVPB
20.0000 mg | Freq: Once | INTRAVENOUS | Status: AC
  Administered 2021-06-16: 20 mg via INTRAVENOUS
  Filled 2021-06-16: qty 100

## 2021-06-16 MED ORDER — SODIUM CHLORIDE 0.9% FLUSH
10.0000 mL | Freq: Once | INTRAVENOUS | Status: AC
  Administered 2021-06-16: 10 mL

## 2021-06-16 MED ORDER — ONDANSETRON HCL 4 MG/2ML IJ SOLN
8.0000 mg | Freq: Once | INTRAMUSCULAR | Status: AC
  Administered 2021-06-16: 8 mg via INTRAVENOUS
  Filled 2021-06-16: qty 4

## 2021-06-16 MED ORDER — SODIUM CHLORIDE 0.9 % IV SOLN
50.0000 mg/m2 | Freq: Once | INTRAVENOUS | Status: AC
  Administered 2021-06-16: 108 mg via INTRAVENOUS
  Filled 2021-06-16: qty 18

## 2021-06-16 MED ORDER — SODIUM CHLORIDE 0.9% FLUSH
10.0000 mL | INTRAVENOUS | Status: DC | PRN
  Administered 2021-06-16: 10 mL

## 2021-06-16 NOTE — Assessment & Plan Note (Signed)
02/15/2021:Screening mammogram: a possible mass in the right breast Diagnostic mammogram and [place]: a persistent irregular mass 1.2 cm within the upper-outer right breast. 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  Recommendationbased on multidisciplinary tumor board: 1. Neoadjuvant chemotherapy with Adriamycin and Cytoxan dose dense 4 followed by Taxol weekly 12  2. Followed by breast conserving surgery with targeted axillary dissection 3. 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   Chemo toxicities 1. Severe restless legs: I sent a prescription for magnesium.  We also reduced the dosage of Taxol. 2. If the symptoms continue to get worse then we may have to stop her treatment. 3. Chemo induced anemia: Hemoglobin is steady at 9.4.  Return to clinic weekly for Taxol treatments. If you end up stopping her chemo sooner, than she will need MRI and surgery evaluations.

## 2021-06-16 NOTE — Patient Instructions (Signed)
[facility] ONCOLOGY  Discharge Instructions: Thank you for choosing [facility] to provide your oncology and hematology care.   If you have a lab appointment with the [facility], please go directly to the [facility] and check in at the registration area.   Wear comfortable clothing and clothing appropriate for easy access to any Portacath or PICC line.   We strive to give you quality time with your provider. You may need to reschedule your appointment if you arrive late (15 or more minutes).  Arriving late affects you and other patients whose appointments are after yours.  Also, if you miss three or more appointments without notifying the office, you may be dismissed from the clinic at the provider's discretion.      For prescription refill requests, have your pharmacy contact our office and allow 72 hours for refills to be completed.    Today you received the following chemotherapy and/or immunotherapy agents: Taxol   To help prevent nausea and vomiting after your treatment, we encourage you to take your nausea medication as directed.  BELOW ARE SYMPTOMS THAT SHOULD BE REPORTED IMMEDIATELY: *FEVER GREATER THAN 100.4 F (38 C) OR HIGHER *CHILLS OR SWEATING *NAUSEA AND VOMITING THAT IS NOT CONTROLLED WITH YOUR NAUSEA MEDICATION *UNUSUAL SHORTNESS OF BREATH *UNUSUAL BRUISING OR BLEEDING *URINARY PROBLEMS (pain or burning when urinating, or frequent urination) *BOWEL PROBLEMS (unusual diarrhea, constipation, pain near the anus) TENDERNESS IN MOUTH AND THROAT WITH OR WITHOUT PRESENCE OF ULCERS (sore throat, sores in mouth, or a toothache) UNUSUAL RASH, SWELLING OR PAIN  UNUSUAL VAGINAL DISCHARGE OR ITCHING   Items with * indicate a potential emergency and should be followed up as soon as possible or go to the Emergency Department if any problems should occur.  Please show the CHEMOTHERAPY ALERT CARD or IMMUNOTHERAPY ALERT CARD at check-in to the  Emergency Department and triage nurse.  Should you have questions after your visit or need to cancel or reschedule your appointment, please contact [facility]  Dept: [phone]  and follow the prompts.  Office hours are 8:00 a.m. to 4:30 p.m. Monday - Friday. Please note that voicemails left after 4:00 p.m. may not be returned until the following business day.  We are closed weekends and major holidays. You have access to a nurse at all times for urgent questions. Please call the main number to the clinic Dept: [phone] and follow the prompts.   For any non-urgent questions, you may also contact your provider using MyChart. We now offer e-Visits for anyone 74 and older to request care online for non-urgent symptoms. For details visit [email or web address].   Also download the MyChart app! Go to the app store, search "MyChart", open the app, select [facility], and log in with your MyChart username and password.  Due to Covid, a mask is required upon entering the hospital/clinic. If you do not have a mask, one will be given to you upon arrival. For doctor visits, patients may have 1 support person aged 67 or older with them. For treatment visits, patients cannot have anyone with them due to current Covid guidelines and our immunocompromised population.

## 2021-06-23 ENCOUNTER — Inpatient Hospital Stay: Payer: BC Managed Care – PPO

## 2021-06-23 ENCOUNTER — Other Ambulatory Visit: Payer: Self-pay

## 2021-06-23 ENCOUNTER — Encounter: Payer: Self-pay | Admitting: *Deleted

## 2021-06-23 VITALS — BP 144/90 | HR 99 | Temp 98.7°F | Resp 18 | Wt 229.5 lb

## 2021-06-23 DIAGNOSIS — Z5111 Encounter for antineoplastic chemotherapy: Secondary | ICD-10-CM | POA: Diagnosis not present

## 2021-06-23 DIAGNOSIS — Z170 Estrogen receptor positive status [ER+]: Secondary | ICD-10-CM

## 2021-06-23 DIAGNOSIS — C50411 Malignant neoplasm of upper-outer quadrant of right female breast: Secondary | ICD-10-CM

## 2021-06-23 DIAGNOSIS — Z95828 Presence of other vascular implants and grafts: Secondary | ICD-10-CM

## 2021-06-23 LAB — CBC WITH DIFFERENTIAL/PLATELET
Abs Immature Granulocytes: 0.03 10*3/uL (ref 0.00–0.07)
Basophils Absolute: 0 10*3/uL (ref 0.0–0.1)
Basophils Relative: 0 %
Eosinophils Absolute: 0.1 10*3/uL (ref 0.0–0.5)
Eosinophils Relative: 2 %
HCT: 32.3 % — ABNORMAL LOW (ref 36.0–46.0)
Hemoglobin: 10.2 g/dL — ABNORMAL LOW (ref 12.0–15.0)
Immature Granulocytes: 1 %
Lymphocytes Relative: 26 %
Lymphs Abs: 1.4 10*3/uL (ref 0.7–4.0)
MCH: 29.1 pg (ref 26.0–34.0)
MCHC: 31.6 g/dL (ref 30.0–36.0)
MCV: 92.3 fL (ref 80.0–100.0)
Monocytes Absolute: 0.6 10*3/uL (ref 0.1–1.0)
Monocytes Relative: 10 %
Neutro Abs: 3.4 10*3/uL (ref 1.7–7.7)
Neutrophils Relative %: 61 %
Platelets: 231 10*3/uL (ref 150–400)
RBC: 3.5 MIL/uL — ABNORMAL LOW (ref 3.87–5.11)
RDW: 15.6 % — ABNORMAL HIGH (ref 11.5–15.5)
WBC: 5.5 10*3/uL (ref 4.0–10.5)
nRBC: 0 % (ref 0.0–0.2)

## 2021-06-23 LAB — COMPREHENSIVE METABOLIC PANEL
ALT: 22 U/L (ref 0–44)
AST: 17 U/L (ref 15–41)
Albumin: 3.6 g/dL (ref 3.5–5.0)
Alkaline Phosphatase: 82 U/L (ref 38–126)
Anion gap: 8 (ref 5–15)
BUN: 11 mg/dL (ref 6–20)
CO2: 24 mmol/L (ref 22–32)
Calcium: 9.1 mg/dL (ref 8.9–10.3)
Chloride: 106 mmol/L (ref 98–111)
Creatinine, Ser: 0.83 mg/dL (ref 0.44–1.00)
GFR, Estimated: >60 mL/min (ref >60)
Glucose, Bld: 191 mg/dL — ABNORMAL HIGH (ref 70–99)
Potassium: 4 mmol/L (ref 3.5–5.1)
Sodium: 138 mmol/L (ref 135–145)
Total Bilirubin: 0.3 mg/dL (ref 0.3–1.2)
Total Protein: 7.8 g/dL (ref 6.5–8.1)

## 2021-06-23 MED ORDER — SODIUM CHLORIDE 0.9% FLUSH
10.0000 mL | INTRAVENOUS | Status: DC | PRN
  Administered 2021-06-23: 10 mL

## 2021-06-23 MED ORDER — HEPARIN SOD (PORK) LOCK FLUSH 100 UNIT/ML IV SOLN
500.0000 [IU] | Freq: Once | INTRAVENOUS | Status: AC | PRN
  Administered 2021-06-23: 500 [IU]

## 2021-06-23 MED ORDER — FAMOTIDINE 20 MG IN NS 100 ML IVPB
20.0000 mg | Freq: Once | INTRAVENOUS | Status: AC
  Administered 2021-06-23: 20 mg via INTRAVENOUS
  Filled 2021-06-23: qty 100

## 2021-06-23 MED ORDER — ONDANSETRON HCL 4 MG/2ML IJ SOLN
8.0000 mg | Freq: Once | INTRAMUSCULAR | Status: AC
  Administered 2021-06-23: 8 mg via INTRAVENOUS
  Filled 2021-06-23: qty 4

## 2021-06-23 MED ORDER — SODIUM CHLORIDE 0.9% FLUSH
10.0000 mL | Freq: Once | INTRAVENOUS | Status: AC
  Administered 2021-06-23: 10 mL

## 2021-06-23 MED ORDER — SODIUM CHLORIDE 0.9 % IV SOLN: Freq: Once | INTRAVENOUS | Status: AC

## 2021-06-23 MED ORDER — SODIUM CHLORIDE 0.9 % IV SOLN
50.0000 mg/m2 | Freq: Once | INTRAVENOUS | Status: AC
  Administered 2021-06-23: 108 mg via INTRAVENOUS
  Filled 2021-06-23: qty 18

## 2021-06-23 NOTE — Patient Instructions (Signed)
[facility] ONCOLOGY  Discharge Instructions: Thank you for choosing [facility] to provide your oncology and hematology care.   If you have a lab appointment with the [facility], please go directly to the [facility] and check in at the registration area.   Wear comfortable clothing and clothing appropriate for easy access to any Portacath or PICC line.   We strive to give you quality time with your provider. You may need to reschedule your appointment if you arrive late (15 or more minutes).  Arriving late affects you and other patients whose appointments are after yours.  Also, if you miss three or more appointments without notifying the office, you may be dismissed from the clinic at the provider's discretion.      For prescription refill requests, have your pharmacy contact our office and allow 72 hours for refills to be completed.    Today you received the following chemotherapy and/or immunotherapy agents Taxol      To help prevent nausea and vomiting after your treatment, we encourage you to take your nausea medication as directed.  BELOW ARE SYMPTOMS THAT SHOULD BE REPORTED IMMEDIATELY: *FEVER GREATER THAN 100.4 F (38 C) OR HIGHER *CHILLS OR SWEATING *NAUSEA AND VOMITING THAT IS NOT CONTROLLED WITH YOUR NAUSEA MEDICATION *UNUSUAL SHORTNESS OF BREATH *UNUSUAL BRUISING OR BLEEDING *URINARY PROBLEMS (pain or burning when urinating, or frequent urination) *BOWEL PROBLEMS (unusual diarrhea, constipation, pain near the anus) TENDERNESS IN MOUTH AND THROAT WITH OR WITHOUT PRESENCE OF ULCERS (sore throat, sores in mouth, or a toothache) UNUSUAL RASH, SWELLING OR PAIN  UNUSUAL VAGINAL DISCHARGE OR ITCHING   Items with * indicate a potential emergency and should be followed up as soon as possible or go to the Emergency Department if any problems should occur.  Please show the CHEMOTHERAPY ALERT CARD or IMMUNOTHERAPY ALERT CARD at check-in to the  Emergency Department and triage nurse.  Should you have questions after your visit or need to cancel or reschedule your appointment, please contact [facility]  Dept: [phone]  and follow the prompts.  Office hours are 8:00 a.m. to 4:30 p.m. Monday - Friday. Please note that voicemails left after 4:00 p.m. may not be returned until the following business day.  We are closed weekends and major holidays. You have access to a nurse at all times for urgent questions. Please call the main number to the clinic Dept: [phone] and follow the prompts.   For any non-urgent questions, you may also contact your provider using MyChart. We now offer e-Visits for anyone 60 and older to request care online for non-urgent symptoms. For details visit [email or web address].   Also download the MyChart app! Go to the app store, search "MyChart", open the app, select [facility], and log in with your MyChart username and password.  Due to Covid, a mask is required upon entering the hospital/clinic. If you do not have a mask, one will be given to you upon arrival. For doctor visits, patients may have 1 support person aged 54 or older with them. For treatment visits, patients cannot have anyone with them due to current Covid guidelines and our immunocompromised population.

## 2021-07-01 ENCOUNTER — Inpatient Hospital Stay: Payer: BC Managed Care – PPO

## 2021-07-01 ENCOUNTER — Other Ambulatory Visit: Payer: Self-pay

## 2021-07-01 VITALS — BP 150/89 | HR 93 | Temp 98.3°F | Resp 18 | Ht 61.0 in | Wt 229.8 lb

## 2021-07-01 DIAGNOSIS — Z95828 Presence of other vascular implants and grafts: Secondary | ICD-10-CM

## 2021-07-01 DIAGNOSIS — Z170 Estrogen receptor positive status [ER+]: Secondary | ICD-10-CM

## 2021-07-01 DIAGNOSIS — Z5111 Encounter for antineoplastic chemotherapy: Secondary | ICD-10-CM | POA: Diagnosis not present

## 2021-07-01 LAB — CBC WITH DIFFERENTIAL/PLATELET
Abs Immature Granulocytes: 0.03 10*3/uL (ref 0.00–0.07)
Basophils Absolute: 0 10*3/uL (ref 0.0–0.1)
Basophils Relative: 0 %
Eosinophils Absolute: 0.1 10*3/uL (ref 0.0–0.5)
Eosinophils Relative: 2 %
HCT: 33.4 % — ABNORMAL LOW (ref 36.0–46.0)
Hemoglobin: 10.7 g/dL — ABNORMAL LOW (ref 12.0–15.0)
Immature Granulocytes: 1 %
Lymphocytes Relative: 22 %
Lymphs Abs: 1.3 10*3/uL (ref 0.7–4.0)
MCH: 29.7 pg (ref 26.0–34.0)
MCHC: 32 g/dL (ref 30.0–36.0)
MCV: 92.8 fL (ref 80.0–100.0)
Monocytes Absolute: 0.8 10*3/uL (ref 0.1–1.0)
Monocytes Relative: 13 %
Neutro Abs: 3.7 10*3/uL (ref 1.7–7.7)
Neutrophils Relative %: 62 %
Platelets: 277 10*3/uL (ref 150–400)
RBC: 3.6 MIL/uL — ABNORMAL LOW (ref 3.87–5.11)
RDW: 15.2 % (ref 11.5–15.5)
WBC: 5.9 10*3/uL (ref 4.0–10.5)
nRBC: 0 % (ref 0.0–0.2)

## 2021-07-01 LAB — COMPREHENSIVE METABOLIC PANEL
ALT: 22 U/L (ref 0–44)
AST: 15 U/L (ref 15–41)
Albumin: 3.7 g/dL (ref 3.5–5.0)
Alkaline Phosphatase: 90 U/L (ref 38–126)
Anion gap: 9 (ref 5–15)
BUN: 13 mg/dL (ref 6–20)
CO2: 24 mmol/L (ref 22–32)
Calcium: 8.9 mg/dL (ref 8.9–10.3)
Chloride: 106 mmol/L (ref 98–111)
Creatinine, Ser: 0.81 mg/dL (ref 0.44–1.00)
GFR, Estimated: >60 mL/min (ref >60)
Glucose, Bld: 187 mg/dL — ABNORMAL HIGH (ref 70–99)
Potassium: 4.1 mmol/L (ref 3.5–5.1)
Sodium: 139 mmol/L (ref 135–145)
Total Bilirubin: 0.3 mg/dL (ref 0.3–1.2)
Total Protein: 7.9 g/dL (ref 6.5–8.1)

## 2021-07-01 MED ORDER — SODIUM CHLORIDE 0.9% FLUSH
10.0000 mL | Freq: Once | INTRAVENOUS | Status: AC
  Administered 2021-07-01: 10 mL

## 2021-07-01 MED ORDER — SODIUM CHLORIDE 0.9 % IV SOLN
50.0000 mg/m2 | Freq: Once | INTRAVENOUS | Status: AC
  Administered 2021-07-01: 108 mg via INTRAVENOUS
  Filled 2021-07-01: qty 18

## 2021-07-01 MED ORDER — FAMOTIDINE 20 MG IN NS 100 ML IVPB
20.0000 mg | Freq: Once | INTRAVENOUS | Status: AC
  Administered 2021-07-01: 20 mg via INTRAVENOUS
  Filled 2021-07-01: qty 100

## 2021-07-01 MED ORDER — SODIUM CHLORIDE 0.9 % IV SOLN
Freq: Once | INTRAVENOUS | Status: AC
Start: 2021-07-01 — End: 2021-07-01

## 2021-07-01 MED ORDER — ONDANSETRON HCL 4 MG/2ML IJ SOLN
8.0000 mg | Freq: Once | INTRAMUSCULAR | Status: AC
  Administered 2021-07-01: 8 mg via INTRAVENOUS
  Filled 2021-07-01: qty 4

## 2021-07-01 NOTE — Patient Instructions (Signed)
[facility] ONCOLOGY  Discharge Instructions: Thank you for choosing [facility] to provide your oncology and hematology care.   If you have a lab appointment with the [facility], please go directly to the [facility] and check in at the registration area.   Wear comfortable clothing and clothing appropriate for easy access to any Portacath or PICC line.   We strive to give you quality time with your provider. You may need to reschedule your appointment if you arrive late (15 or more minutes).  Arriving late affects you and other patients whose appointments are after yours.  Also, if you miss three or more appointments without notifying the office, you may be dismissed from the clinic at the providers discretion.      For prescription refill requests, have your pharmacy contact our office and allow 72 hours for refills to be completed.    Today you received the following chemotherapy and/or immunotherapy agents Taxol      To help prevent nausea and vomiting after your treatment, we encourage you to take your nausea medication as directed.  BELOW ARE SYMPTOMS THAT SHOULD BE REPORTED IMMEDIATELY: *FEVER GREATER THAN 100.4 F (38 C) OR HIGHER *CHILLS OR SWEATING *NAUSEA AND VOMITING THAT IS NOT CONTROLLED WITH YOUR NAUSEA MEDICATION *UNUSUAL SHORTNESS OF BREATH *UNUSUAL BRUISING OR BLEEDING *URINARY PROBLEMS (pain or burning when urinating, or frequent urination) *BOWEL PROBLEMS (unusual diarrhea, constipation, pain near the anus) TENDERNESS IN MOUTH AND THROAT WITH OR WITHOUT PRESENCE OF ULCERS (sore throat, sores in mouth, or a toothache) UNUSUAL RASH, SWELLING OR PAIN  UNUSUAL VAGINAL DISCHARGE OR ITCHING   Items with * indicate a potential emergency and should be followed up as soon as possible or go to the Emergency Department if any problems should occur.  Please show the CHEMOTHERAPY ALERT CARD or IMMUNOTHERAPY ALERT CARD at check-in to the  Emergency Department and triage nurse.  Should you have questions after your visit or need to cancel or reschedule your appointment, please contact [facility]  Dept: [phone]  and follow the prompts.  Office hours are 8:00 a.m. to 4:30 p.m. Monday - Friday. Please note that voicemails left after 4:00 p.m. may not be returned until the following business day.  We are closed weekends and major holidays. You have access to a nurse at all times for urgent questions. Please call the main number to the clinic Dept: [phone] and follow the prompts.   For any non-urgent questions, you may also contact your provider using MyChart. We now offer e-Visits for anyone 90 and older to request care online for non-urgent symptoms. For details visit [email or web address].   Also download the MyChart app! Go to the app store, search "MyChart", open the app, select [facility], and log in with your MyChart username and password.  Due to Covid, a mask is required upon entering the hospital/clinic. If you do not have a mask, one will be given to you upon arrival. For doctor visits, patients may have 1 support person aged 12 or older with them. For treatment visits, patients cannot have anyone with them due to current Covid guidelines and our immunocompromised population.

## 2021-07-06 ENCOUNTER — Encounter: Payer: Self-pay | Admitting: Licensed Clinical Social Worker

## 2021-07-06 NOTE — Progress Notes (Signed)
[facility] Work  Holiday representative  received TC from patient   with concerns about finances and insurance.  Patient works for [employer] and has been utilizing PTO but will need to go on short-term disability in January for her surgery. She was told that she might have to cover the whole [employer] premium rather than just her portion but she will only receive 50% of her typical pay while on short term disability.  CSW reviewed STD and FMLA and encouraged pt to confirm with her HR if she needs to cover just her portion or the whole premium.  Patient may also try applying for Medicaid if she chooses to.  CSW then reviewed other options for financial assistance while on reduced pay such as [employer], Publishing copy in [place], and [employer].  Pt agreed to have CSW meet with her on Friday to review applications.  Plan: Pt will check with her HR regarding what amount of premiums she will need to pay CSW will meet with pt on Friday to review applications for breast cancer foundations.    [place], [place] Worker [employer]

## 2021-07-07 NOTE — Assessment & Plan Note (Addendum)
02/15/2021:Screening mammogram: a possible mass in the right breast Diagnostic mammogram and [place]: a persistent irregular mass 1.2 cm within the upper-outer right breast. 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  Recommendationbased on multidisciplinary tumor board: 1. Neoadjuvant chemotherapy with Adriamycin and Cytoxan dose dense 4 followed by Taxol weekly 12  2. Followed by breast conserving surgery with targeted axillary dissection 3. 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   Due to Sandra Bishop's peripheral neuropathy we will stop her chemotherapy.  She has received 9 cycles of treatment therefore has received a majority of the benefit from the Taxol.  She met with Dr. Payton Mccallum today to discuss this and they reviewed her overall prognosis.  I placed orders for her post neoadjuvant chemotherapy MRI to assess her treatment response.  I sent Dr. Barry Dienes a message to go ahead and get [name] in for follow-up to discuss surgical planning.  We will get a chest x-ray to further evaluate her cough.  I am  hopeful with her stopping the Taxol this will improve.  Her lung exam is benign today.  She will continue on her current cough medication.  There is not an indication for antibiotics at this point.  We did discuss her blood sugars today.  I reviewed with her that I recommended that she get back in with her primary care now that she has completed her chemotherapy to look at her regimen.  She is going to keep a log of her blood sugars over the next 2 weeks to be able to provide an accurate depiction, since an A1c at this point may be misleading because she was on steroids for some of the past few months.  She will continue to watch her carbohydrate intake and exercise.  Mar[place] returns following her surgery to discuss her pathology results and next steps.

## 2021-07-07 NOTE — Progress Notes (Addendum)
[facility] Follow up:    Sandra Smoker, MD [place] [address]   DIAGNOSIS:  Cancer Staging  Malignant neoplasm of upper-outer quadrant of right breast in female, estrogen receptor positive ([place]) Staging form: Breast, AJCC 8th Edition - Clinical stage from 02/23/2021: Stage IIB (cT1c, cN1(f), cM0, G3, ER+, PR-, HER2-) - Signed by Sandra Lose, MD on 02/23/2021 Stage prefix: Initial diagnosis Method of lymph node assessment: Core biopsy Histologic grading system: 3 grade system   SUMMARY OF ONCOLOGIC HISTORY: Oncology History  Malignant neoplasm of upper-outer quadrant of right breast in female, estrogen receptor positive ([place])  02/15/2021 Initial Diagnosis   Screening mammogram: a possible mass in the right breast  Diagnostic mammogram and [place]: a persistent irregular mass 1.2 cm within the upper-outer right breast. 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    02/23/2021 Cancer Staging   Staging form: Breast, AJCC 8th Edition - Clinical stage from 02/23/2021: Stage IIB (cT1c, cN1(f), cM0, G3, ER+, PR-, HER2-) - Signed by Sandra Lose, MD on 02/23/2021 Stage prefix: Initial diagnosis Method of lymph node assessment: Core biopsy Histologic grading system: 3 grade system    03/04/2021 - 07/01/2021 Neo-Adjuvant Chemotherapy   Received four cycles of neoadjuvant Adriamycin and Cytoxan from 03/04/2021-04/14/2021.  Followed by 9 cycles of Taxol from 04/28/2021-07/01/2021.  Taxol was dose reduced beginning with cycle 6 due to restless legs and stopped after 9 cycles due to peripheral neuropathy.    03/10/2021 Genetic Testing   Negative genetic testing. No pathogenic variants identified on the [facility] CancerNext-Expanded+RNA panel. The report date is 03/10/2021.  The CancerNext-Expanded + RNAinsight gene panel offered by [employer] and includes sequencing and  rearrangement analysis for the following 77 genes: IP, ALK, APC*, ATM*, AXIN2, BAP1, BARD1, BLM, BMPR1A, BRCA1*, BRCA2*, BRIP1*, CDC73, CDH1*,CDK4, CDKN1B, CDKN2A, CHEK2*, CTNNA1, DICER1, FANCC, FH, FLCN, GALNT12, KIF1B, LZTR1, MAX, MEN1, MET, MLH1*, MSH2*, MSH3, MSH6*, MUTYH*, NBN, NF1*, NF2, NTHL1, PALB2*, PHOX2B, PMS2*, POT1, PRKAR1A, PTCH1, PTEN*, RAD51C*, RAD51D*,RB1, RECQL, RET, SDHA, SDHAF2, SDHB, SDHC, SDHD, SMAD4, SMARCA4, SMARCB1, SMARCE1, STK11, SUFU, TMEM127, TP53*,TSC1, TSC2, VHL and XRCC2 (sequencing and deletion/duplication); EGFR, EGLN1, HOXB13, KIT, MITF, PDGFRA, POLD1 and POLE (sequencing only); EPCAM and GREM1 (deletion/duplication only).     CURRENT THERAPY: Taxol week 10  INTERVAL HISTORY: Sandra Bishop 50 y.o. female returns for evaluation prior to receiving her 10th cycle of neoadjuvant chemotherapy with weekly paclitaxel.  She notes that she was never able to fill her breast cancer initially.  She has not been able to feel anything in her breast since then.    Sandra Bishop's main concern today is a persistent cough that began about 3 weeks ago.  It is nonproductive.  She says that she feels like when she ices her hands and feet for neuropathy prevention that it makes the cough worse.  She has been on a Z-Pak for it.  She does have some pain in her left lower rib cage.  She has taken cough syrup that was over-the-counter however her blood sugar increased.  She called our office last week and was instructed to get the diabetic [place] and since then her sugars have been more controlled.  [name] does report a constant slight peripheral neuropathy in the very tips of her fingers.  She denies any motor deficits in her fingertips or toes.  She has no balance changes.   Patient Active Problem List  Diagnosis Date Noted   Hyperglycemia due to type 2 diabetes mellitus ([place]) 07/08/2021   Pure hypercholesterolemia 07/08/2021   Morbid obesity ([place]) 07/08/2021   Genetic testing  03/17/2021   Port-A-Cath in place 03/03/2021   Family history of breast cancer 02/24/2021   Family history of pancreatic cancer 02/24/2021   Family history of colon cancer 02/24/2021   Family history of lung cancer 02/24/2021   Malignant neoplasm of upper-outer quadrant of right breast in female, estrogen receptor positive ([place]) 02/15/2021   Obesity    Constipation 08/30/2011    has No Known Allergies.  MEDICAL HISTORY: Past Medical History:  Diagnosis Date   Allergy    Anemia    Breast cancer ([place])    Right   Constipation    Diabetes mellitus without complication ([place])    Family history of breast cancer    Family history of colon cancer    Family history of lung cancer    Family history of pancreatic cancer    History of migraine    Obesity    Palpitations     SURGICAL HISTORY: Past Surgical History:  Procedure Laterality Date   BREAST BIOPSY Right 02/11/2021   x2   BREAST BIOPSY Right 03/22/2021   x3   EYE SURGERY Right    PORTACATH PLACEMENT N/A 03/02/2021   Procedure: INSERTION PORT-A-CATH;  Surgeon: Sandra Klein, MD;  Location: [facility];  Service: General;  Laterality: N/A;   TUBAL LIGATION      SOCIAL HISTORY: Social History   Socioeconomic History   Marital status: Divorced    Spouse name: Not on file   Number of children: 2   Years of education: Not on file   Highest education level: Not on file  Occupational History   Not on file  Tobacco Use   Smoking status: Former    Types: Cigarettes    Quit date: 12/28/2010    Years since quitting: 10.5   Smokeless tobacco: Never   Tobacco comments:    quit in 2012, previously smoked 15 years  Vaping Use   Vaping Use: Not on file  Substance and Sexual Activity   Alcohol use: Not Currently    Comment: occ   Drug use: No   Sexual activity: Yes    Birth control/protection: Surgical  Other Topics Concern   Not on file  Social History Narrative   Not on file   Social Determinants of Health    Financial Resource Strain: Not on file  Food Insecurity: Not on file  Transportation Needs: Not on file  Physical Activity: Not on file  Stress: Not on file  Social Connections: Not on file  Intimate Partner Violence: Not on file    FAMILY HISTORY: Family History  Problem Relation Age of Onset   Hypertension Mother    Pancreatic cancer Father 85   Breast cancer Half-Sister 24       negative genetic testing   Colon cancer Maternal Uncle        dx >50   Lung cancer Maternal Uncle        dx 80s, hx smoking   Cancer Paternal Aunt        unknown type, dx early 87s   Heart failure Maternal Grandmother    Breast cancer Other 69       mother's first cousin    Review of Systems  Constitutional:  Negative for appetite change, chills, fatigue, fever and unexpected weight change.  HENT:   Negative for hearing  loss, lump/mass, mouth sores and trouble swallowing.   Eyes:  Negative for eye problems and icterus.  Respiratory:  Positive for cough. Negative for chest tightness, hemoptysis, shortness of breath and wheezing.   Cardiovascular:  Negative for chest pain, leg swelling and palpitations.  Gastrointestinal:  Negative for abdominal distention, abdominal pain, constipation, diarrhea, nausea and vomiting.  Endocrine: Negative for hot flashes.  Genitourinary:  Negative for difficulty urinating.   Musculoskeletal:  Negative for arthralgias.  Skin:  Negative for itching and rash.  Neurological:  Negative for dizziness, extremity weakness, headaches and numbness.  Hematological:  Negative for adenopathy. Does not bruise/bleed easily.  Psychiatric/Behavioral:  Negative for depression. The patient is not nervous/anxious.      PHYSICAL EXAMINATION  ECOG PERFORMANCE STATUS: 1 - Symptomatic but completely ambulatory  Vitals:   07/08/21 0814  BP: (!) 152/82  Pulse: (!) 107  Resp: 18  Temp: (!) 97.2 F (36.2 C)  SpO2: 92%    Physical Exam Constitutional:      General: She is  not in acute distress.    Appearance: Normal appearance. She is not toxic-appearing.  HENT:     Head: Normocephalic and atraumatic.     Mouth/Throat:     Mouth: Mucous membranes are moist.     Pharynx: No oropharyngeal exudate.  Eyes:     General: No scleral icterus. Cardiovascular:     Rate and Rhythm: Normal rate and regular rhythm.     Pulses: Normal pulses.     Heart sounds: Normal heart sounds.  Pulmonary:     Effort: Pulmonary effort is normal. No respiratory distress.     Breath sounds: Normal breath sounds. No wheezing, rhonchi or rales.  Abdominal:     General: Abdomen is flat. Bowel sounds are normal. There is no distension.     Palpations: Abdomen is soft.     Tenderness: There is no abdominal tenderness.  Musculoskeletal:        General: No swelling.     Cervical back: Neck supple.  Lymphadenopathy:     Cervical: No cervical adenopathy.  Skin:    General: Skin is warm and dry.     Findings: No rash.  Neurological:     General: No focal deficit present.     Mental Status: She is alert.  Psychiatric:        Mood and Affect: Mood normal.        Behavior: Behavior normal.    LABORATORY DATA:  CBC    Component Value Date/Time   WBC 5.4 07/08/2021 0753   RBC 3.46 (L) 07/08/2021 0753   HGB 10.3 (L) 07/08/2021 0753   HGB 8.4 (L) 05/05/2021 0816   HGB 11.5 08/11/2020 1654   HCT 31.9 (L) 07/08/2021 0753   HCT 35.9 08/11/2020 1654   PLT 271 07/08/2021 0753   PLT 355 05/05/2021 0816   PLT 330 10/06/2019 1516   MCV 92.2 07/08/2021 0753   MCV 81 08/11/2020 1654   MCH 29.8 07/08/2021 0753   MCHC 32.3 07/08/2021 0753   RDW 14.5 07/08/2021 0753   RDW 14.1 08/11/2020 1654   LYMPHSABS 1.4 07/08/2021 0753   LYMPHSABS 2.9 08/11/2020 1654   MONOABS 0.7 07/08/2021 0753   EOSABS 0.1 07/08/2021 0753   EOSABS 0.2 08/11/2020 1654   BASOSABS 0.0 07/08/2021 0753   BASOSABS 0.0 08/11/2020 1654    CMP     Component Value Date/Time   NA 137 07/08/2021 0753   NA 140  08/11/2020 1654  K 4.0 07/08/2021 0753   CL 105 07/08/2021 0753   CO2 25 07/08/2021 0753   GLUCOSE 224 (H) 07/08/2021 0753   BUN 12 07/08/2021 0753   BUN 12 08/11/2020 1654   CREATININE 0.78 07/08/2021 0753   CREATININE 0.78 05/05/2021 0816   CREATININE 0.78 09/29/2015 1621   CALCIUM 9.2 07/08/2021 0753   PROT 7.6 07/08/2021 0753   PROT 8.0 08/11/2020 1654   ALBUMIN 3.8 07/08/2021 0753   ALBUMIN 4.2 08/11/2020 1654   AST 19 07/08/2021 0753   AST 24 05/05/2021 0816   ALT 19 07/08/2021 0753   ALT 29 05/05/2021 0816   ALKPHOS 76 07/08/2021 0753   BILITOT 0.3 07/08/2021 0753   BILITOT <0.2 (L) 05/05/2021 0816   GFRNONAA >60 07/08/2021 0753   GFRNONAA >60 05/05/2021 0816   GFRAA 110 08/11/2020 1654           ASSESSMENT and THERAPY PLAN:   Malignant neoplasm of upper-outer quadrant of right breast in female, estrogen receptor positive ([place]) 02/15/2021:Screening mammogram: a possible mass in the right breast  Diagnostic mammogram and [place]: a persistent irregular mass 1.2 cm within the upper-outer right breast. 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    Recommendation based on multidisciplinary tumor board: 1. Neoadjuvant chemotherapy with Adriamycin and Cytoxan dose dense 4 followed by Taxol weekly 12  2. Followed by breast conserving surgery with  targeted axillary dissection 3. Followed by adjuvant radiation therapy 4.  Followed by adjuvant antiestrogen therapy   Breast MRI 02/28/2021: Biopsy-proven malignancy 1.8 cm right breast, additional 4.3 cm area of non-mass enhancement (multiple additional oval masses in the right breast: 6 mm, 8 mm, 8 mm, 1 cm), left breast 0.8 cm oval mass indeterminate, 1 enlarged right axillary lymph node (recommendation: Biopsy of non-mass enhancement, biopsy of 2 indeterminate right breast masses and biopsy of left breast) Left breast biopsy UIQ: Fibroadenoma with  ALH Right breast biopsies x3: Benign ------------------------------------------------------------------------------------------------------------------------------------ Current treatment: Completed 4 cycles of dose dense Adriamycin and Cytoxan (she started 03/04/2021), today cycle 10 Taxol Because of her diabetes we decided to discontinue dexamethasone.   Due to Pakou's peripheral neuropathy we will stop her chemotherapy.  She has received 9 cycles of treatment therefore has received a majority of the benefit from the Taxol.  She met with Dr. Payton Mccallum today to discuss this and they reviewed her overall prognosis.  I placed orders for her post neoadjuvant chemotherapy MRI to assess her treatment response.  I sent Dr. Barry Dienes a message to go ahead and get Acacia in for follow-up to discuss surgical planning.  We will get a chest x-ray to further evaluate her cough.  I am hopeful with her stopping the Taxol this will improve.  Her lung exam is benign today.  She will continue on her current cough medication.  There is not an indication for antibiotics at this point.  We did discuss her blood sugars today.  I reviewed with her that I recommended that she get back in with her primary care now that she has completed her chemotherapy to look at her regimen.  She is going to keep a log of her blood sugars over the next 2 weeks to be able to provide an accurate depiction, since an A1c at this point may be misleading because she was on steroids for some of the past few months.  She will continue to watch her carbohydrate intake and exercise.  Sharyn Lull  returns following her surgery to discuss her pathology results and next steps.   Orders Placed This Encounter  Procedures   MR BREAST BILATERAL W WO CONTRAST INC CAD    Standing Status:   Future    Standing Expiration Date:   07/08/2022    Order Specific Question:   If indicated for the ordered procedure, I authorize the administration of contrast media per  Radiology protocol    Answer:   Yes    Order Specific Question:   What is the patient's sedation requirement?    Answer:   No Sedation    Order Specific Question:   Does the patient have a pacemaker or implanted devices?    Answer:   No    Order Specific Question:   Preferred imaging location?    Answer:   [facility] (table limit - 550 lbs)   DG Chest 2 View    Standing Status:   Future    Standing Expiration Date:   07/08/2022    Order Specific Question:   Reason for Exam (SYMPTOM  OR DIAGNOSIS REQUIRED)    Answer:   cough    Order Specific Question:   Is patient pregnant?    Answer:   No    Order Specific Question:   Preferred imaging location?    Answer:   [facility]    All questions were answered. The patient knows to call the clinic with any problems, questions or concerns. We can certainly see the patient much sooner if necessary.  Total encounter time: 30 minutes in face-to-face visit time, chart review, lab review, order entry, care coordination, discussion with Dr. Lindi Adie, and documentation of the encounter.  Wilber Bihari, NP 07/08/21 8:58 AM Medical Oncology and Hematology [facility] [place], [place] [address] Tel. [phone]    Fax. [phone]  *Total Encounter Time as defined by the Centers for Medicare and Medicaid Services includes, in addition to the face-to-face time of a patient visit (documented in the note above) non-face-to-face time: obtaining and reviewing outside history, ordering and reviewing medications, tests or procedures, care coordination (communications with other health care professionals or caregivers) and documentation in the medical record.  Attending Note  I personally saw and examined Luz Brazen. The plan of care was discussed with her. I agree with the physical exam findings and assessment and plan as documented above. I performed the majority of the counseling and assessment and plan  regarding this encounter Right breast cancer neoadjuvant chemotherapy.  Based on profound neuropathy in the hands and feet we discontinued a further chemotherapy. Patient will undergo breast MRI and surgery consultations. Follow-up after surgery to discuss the final pathology results. Port can be removed during surgery. Signed [name], MD

## 2021-07-08 ENCOUNTER — Inpatient Hospital Stay: Payer: BC Managed Care – PPO | Admitting: Licensed Clinical Social Worker

## 2021-07-08 ENCOUNTER — Other Ambulatory Visit: Payer: Self-pay

## 2021-07-08 ENCOUNTER — Inpatient Hospital Stay (HOSPITAL_BASED_OUTPATIENT_CLINIC_OR_DEPARTMENT_OTHER): Payer: BC Managed Care – PPO | Admitting: Adult Health

## 2021-07-08 ENCOUNTER — Inpatient Hospital Stay: Payer: BC Managed Care – PPO

## 2021-07-08 ENCOUNTER — Ambulatory Visit (HOSPITAL_COMMUNITY)
Admission: RE | Admit: 2021-07-08 | Discharge: 2021-07-08 | Disposition: A | Discharge status: Home / Self Care | Payer: BC Managed Care – PPO | Source: Ambulatory Visit | Attending: Adult Health | Admitting: Adult Health

## 2021-07-08 ENCOUNTER — Encounter: Payer: Self-pay | Admitting: Adult Health

## 2021-07-08 ENCOUNTER — Telehealth: Payer: Self-pay

## 2021-07-08 VITALS — BP 152/82 | HR 107 | Temp 97.2°F | Resp 18 | Wt 231.2 lb

## 2021-07-08 DIAGNOSIS — G620 Drug-induced polyneuropathy: Secondary | ICD-10-CM | POA: Diagnosis not present

## 2021-07-08 DIAGNOSIS — T451X5A Adverse effect of antineoplastic and immunosuppressive drugs, initial encounter: Secondary | ICD-10-CM

## 2021-07-08 DIAGNOSIS — C50411 Malignant neoplasm of upper-outer quadrant of right female breast: Secondary | ICD-10-CM

## 2021-07-08 DIAGNOSIS — Z95828 Presence of other vascular implants and grafts: Secondary | ICD-10-CM

## 2021-07-08 DIAGNOSIS — Z170 Estrogen receptor positive status [ER+]: Secondary | ICD-10-CM | POA: Diagnosis present

## 2021-07-08 DIAGNOSIS — E7800 Pure hypercholesterolemia, unspecified: Secondary | ICD-10-CM | POA: Insufficient documentation

## 2021-07-08 DIAGNOSIS — E1165 Type 2 diabetes mellitus with hyperglycemia: Secondary | ICD-10-CM | POA: Diagnosis not present

## 2021-07-08 LAB — CBC WITH DIFFERENTIAL/PLATELET
Abs Immature Granulocytes: 0.02 10*3/uL (ref 0.00–0.07)
Basophils Absolute: 0 10*3/uL (ref 0.0–0.1)
Basophils Relative: 0 %
Eosinophils Absolute: 0.1 10*3/uL (ref 0.0–0.5)
Eosinophils Relative: 1 %
HCT: 31.9 % — ABNORMAL LOW (ref 36.0–46.0)
Hemoglobin: 10.3 g/dL — ABNORMAL LOW (ref 12.0–15.0)
Immature Granulocytes: 0 %
Lymphocytes Relative: 25 %
Lymphs Abs: 1.4 10*3/uL (ref 0.7–4.0)
MCH: 29.8 pg (ref 26.0–34.0)
MCHC: 32.3 g/dL (ref 30.0–36.0)
MCV: 92.2 fL (ref 80.0–100.0)
Monocytes Absolute: 0.7 10*3/uL (ref 0.1–1.0)
Monocytes Relative: 13 %
Neutro Abs: 3.2 10*3/uL (ref 1.7–7.7)
Neutrophils Relative %: 61 %
Platelets: 271 10*3/uL (ref 150–400)
RBC: 3.46 MIL/uL — ABNORMAL LOW (ref 3.87–5.11)
RDW: 14.5 % (ref 11.5–15.5)
WBC: 5.4 10*3/uL (ref 4.0–10.5)
nRBC: 0 % (ref 0.0–0.2)

## 2021-07-08 LAB — COMPREHENSIVE METABOLIC PANEL
ALT: 19 U/L (ref 0–44)
AST: 19 U/L (ref 15–41)
Albumin: 3.8 g/dL (ref 3.5–5.0)
Alkaline Phosphatase: 76 U/L (ref 38–126)
Anion gap: 7 (ref 5–15)
BUN: 12 mg/dL (ref 6–20)
CO2: 25 mmol/L (ref 22–32)
Calcium: 9.2 mg/dL (ref 8.9–10.3)
Chloride: 105 mmol/L (ref 98–111)
Creatinine, Ser: 0.78 mg/dL (ref 0.44–1.00)
GFR, Estimated: >60 mL/min (ref >60)
Glucose, Bld: 224 mg/dL — ABNORMAL HIGH (ref 70–99)
Potassium: 4 mmol/L (ref 3.5–5.1)
Sodium: 137 mmol/L (ref 135–145)
Total Bilirubin: 0.3 mg/dL (ref 0.3–1.2)
Total Protein: 7.6 g/dL (ref 6.5–8.1)

## 2021-07-08 IMAGING — DX DG CHEST 2V
2 series · 2 of 2 positions shown · non-contrast
Comparison: Chest x-ray dated [DATE].

CLINICAL DATA: Productive cough for the past 3 weeks.

EXAM:
CHEST - 2 VIEW

[chest pa]
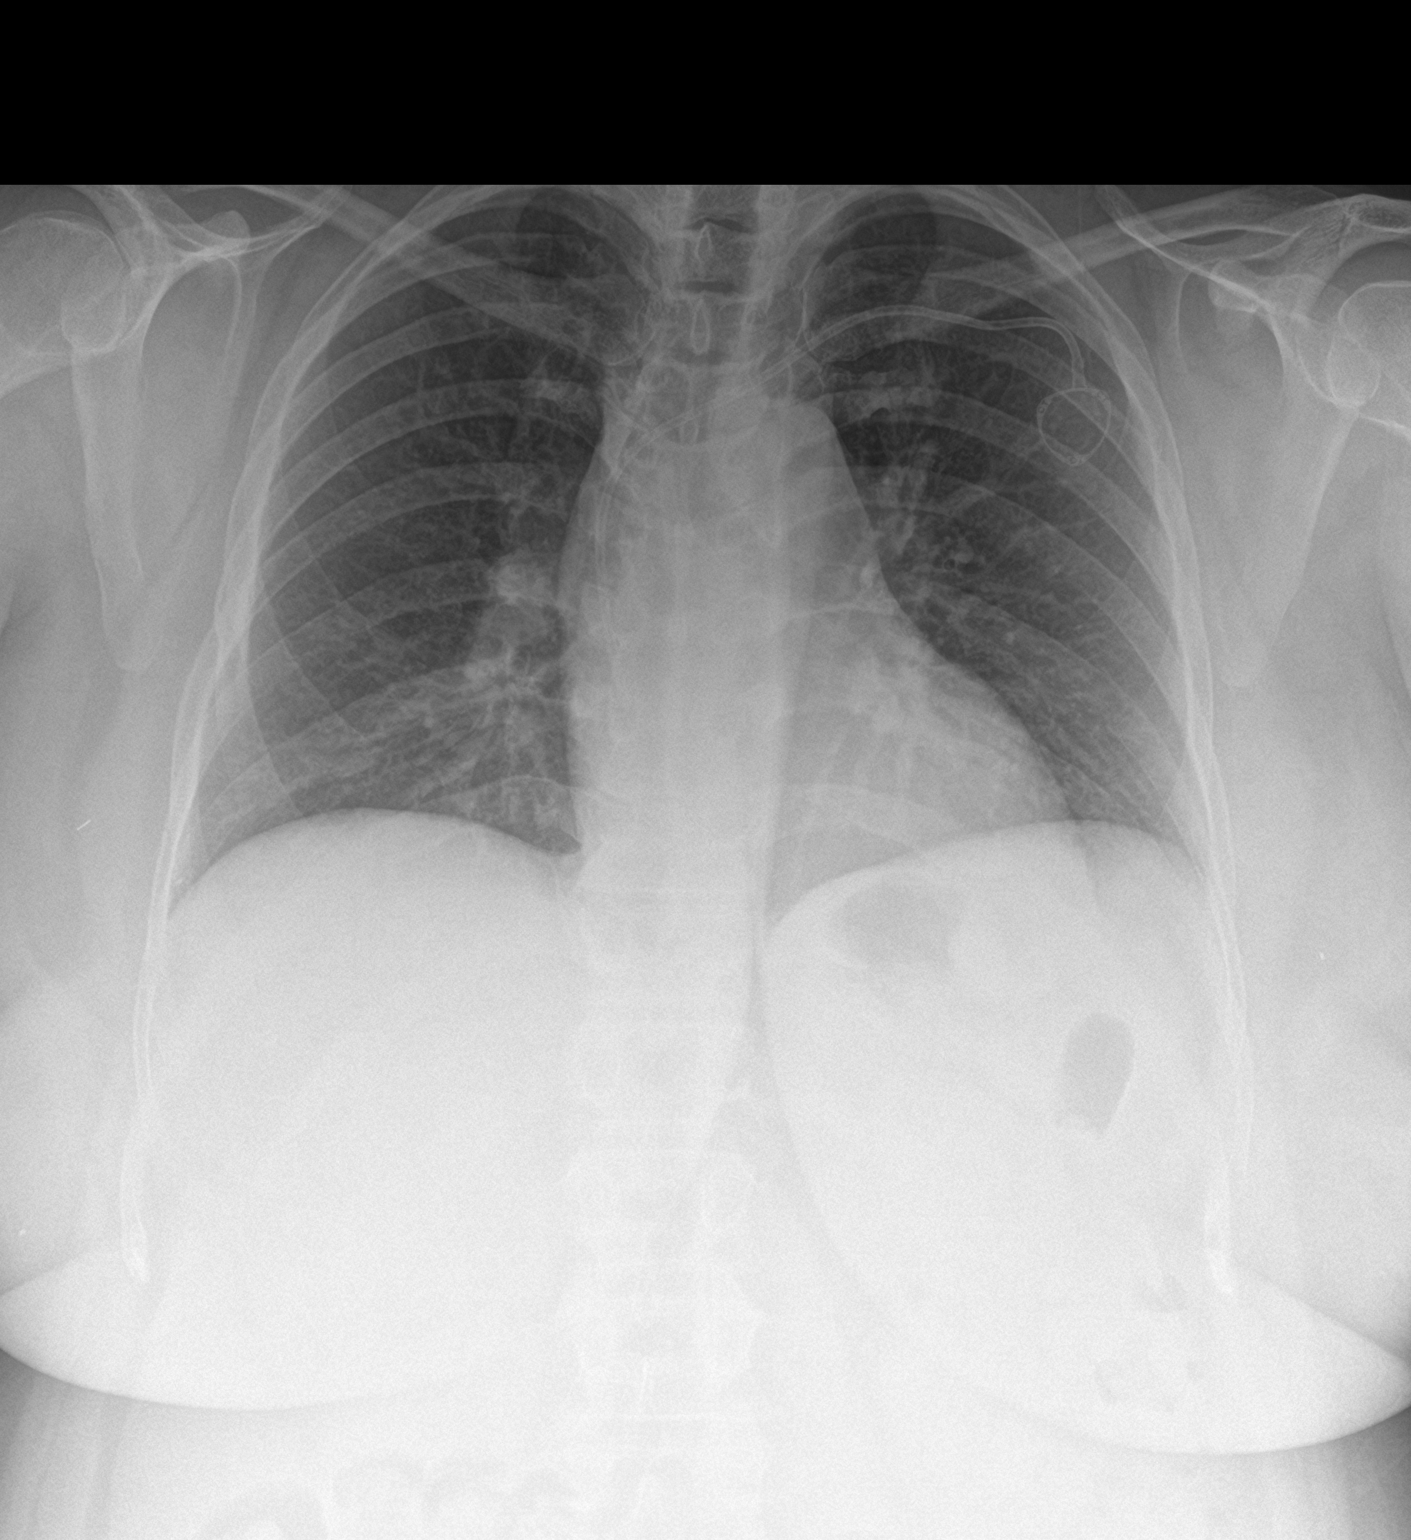

[chest lat]
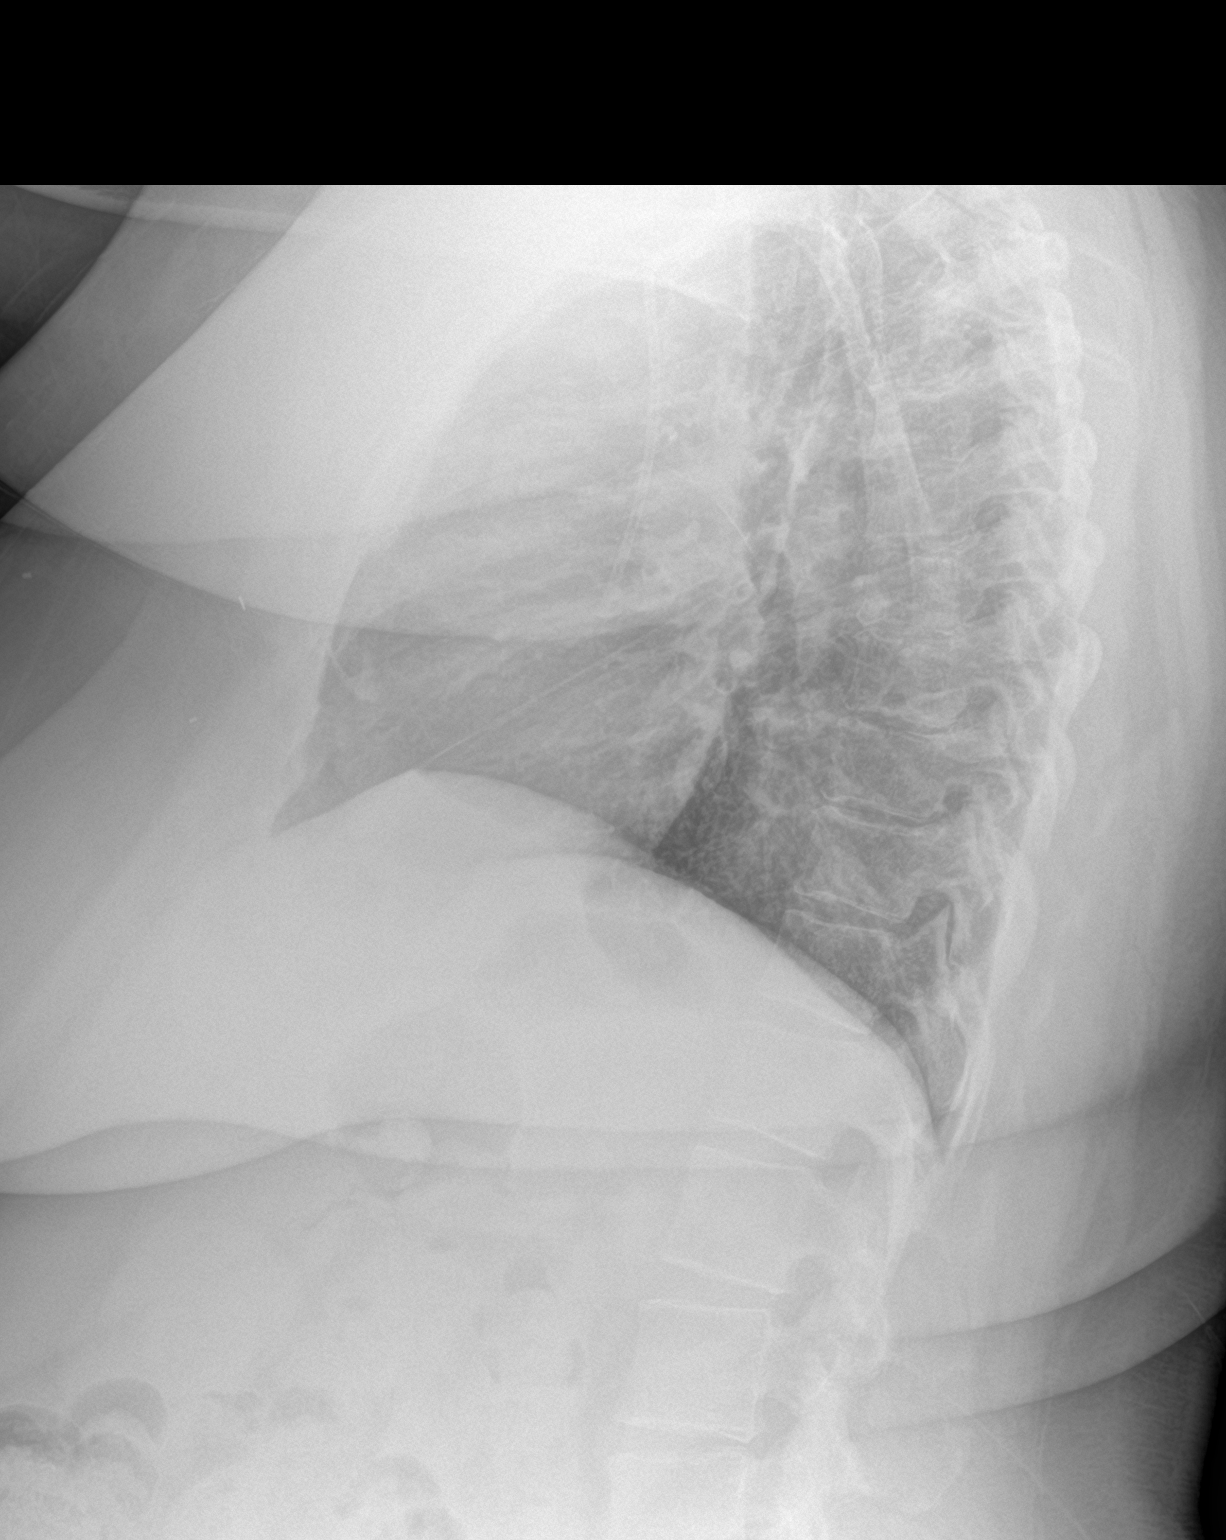

[2 of 2 positions shown; findings below may reference images not displayed]

FINDINGS: Unchanged left chest wall port catheter. The heart size and
mediastinal contours are within normal limits. Normal pulmonary
vascularity. No focal consolidation, pleural effusion, or
pneumothorax. No acute osseous abnormality.
IMPRESSION: 1.  No active cardiopulmonary disease.

## 2021-07-08 MED ORDER — SODIUM CHLORIDE 0.9% FLUSH
10.0000 mL | Freq: Once | INTRAVENOUS | Status: AC
Start: 2021-07-08 — End: 2021-07-08
  Administered 2021-07-08: 08:00:00 10 mL

## 2021-07-08 NOTE — Telephone Encounter (Signed)
Called and spoke with pt, per LC pt xray shows no signs of infection or problems causing pt cough. Pt verbalized understanding and thanks

## 2021-07-14 ENCOUNTER — Other Ambulatory Visit: Payer: Self-pay

## 2021-07-14 ENCOUNTER — Inpatient Hospital Stay: Payer: BC Managed Care – PPO | Admitting: Licensed Clinical Social Worker

## 2021-07-14 ENCOUNTER — Inpatient Hospital Stay: Payer: BC Managed Care – PPO

## 2021-07-14 ENCOUNTER — Encounter: Payer: Self-pay | Admitting: *Deleted

## 2021-07-14 DIAGNOSIS — Z170 Estrogen receptor positive status [ER+]: Secondary | ICD-10-CM

## 2021-07-14 NOTE — Progress Notes (Signed)
[facility] CSW Progress Note  Holiday representative met with patient to review applications to breast cancer foundations. Patient has returned [place] in [place], [place]. CSW is waiting on a few more supporting documents from patient prior to submitting. Pt will return documents as soon as possible.  Pt also has a meeting with her HR representatives in the new year and will clarify what amount of her insurance premium will be her responsibility while on leave.   Christeen Douglas LCSW

## 2021-07-19 ENCOUNTER — Encounter: Payer: Self-pay | Admitting: Licensed Clinical Social Worker

## 2021-07-19 NOTE — Progress Notes (Signed)
[facility] CSW Progress Note  Clinical Education officer, museum submitted [employer] in [place] applications today. Notified pt. Foundations will contact pt directly regarding assistance.   Christeen Douglas , LCSW

## 2021-07-20 ENCOUNTER — Ambulatory Visit
Admission: RE | Admit: 2021-07-20 | Discharge: 2021-07-20 | Disposition: A | Discharge status: Home / Self Care | Payer: BC Managed Care – PPO | Source: Ambulatory Visit | Attending: Adult Health | Admitting: Adult Health

## 2021-07-20 ENCOUNTER — Encounter (HOSPITAL_COMMUNITY): Payer: Self-pay

## 2021-07-20 ENCOUNTER — Ambulatory Visit (HOSPITAL_COMMUNITY)
Admission: RE | Admit: 2021-07-20 | Discharge: 2021-07-20 | Disposition: A | Discharge status: Home / Self Care | Payer: BC Managed Care – PPO | Source: Ambulatory Visit | Attending: Adult Health | Admitting: Adult Health

## 2021-07-20 ENCOUNTER — Other Ambulatory Visit: Payer: Self-pay | Admitting: Adult Health

## 2021-07-20 ENCOUNTER — Other Ambulatory Visit: Payer: Self-pay

## 2021-07-20 DIAGNOSIS — Z170 Estrogen receptor positive status [ER+]: Secondary | ICD-10-CM

## 2021-07-20 IMAGING — MR MR BREAST BILAT WO/W CM
8 of 12 series · 32 of 48 positions shown · IV contrast (10 ml gadavist)
Comparison: Previous exam(s).

CLINICAL DATA: 50-year-old female for evaluation of neoadjuvant
treatment response to UPPER-OUTER RIGHT breast invasive ductal
carcinoma and metastatic carcinoma involving a RIGHT axillary lymph
node. Patient has had other bilateral biopsy-proven benign RIGHT
breast masses and biopsy-proven UPPER INNER LEFT breast fibroadenoma
with lobular neoplasia.

EXAM:
BILATERAL BREAST MRI WITH AND WITHOUT CONTRAST
TECHNIQUE: Multiplanar, multisequence MR images of both breasts were obtained
prior to and following the intravenous administration of 9 ml of
Gadavist

[Series 2: t2_tirm_tra ipat (a-p) · axial · 3.0mm · 0.72mm/px · 1 of 55 slices shown]
[im 1/55]
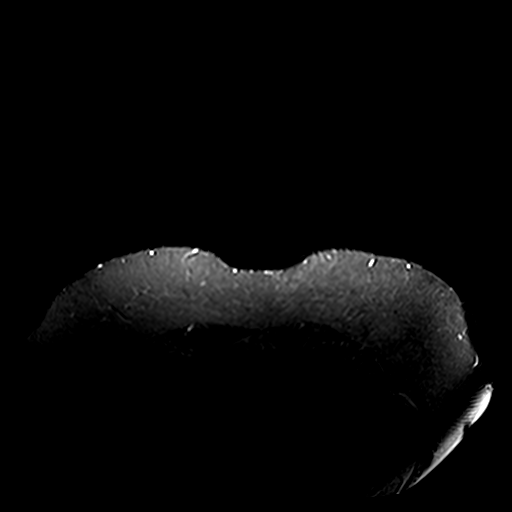

[Series 3: fl3d pre-cm no · axial · non-contrast · 1.2mm · 0.94mm/px · z∈[-110,+61]mm · 5 of 144 slices shown]
[im 1/144]
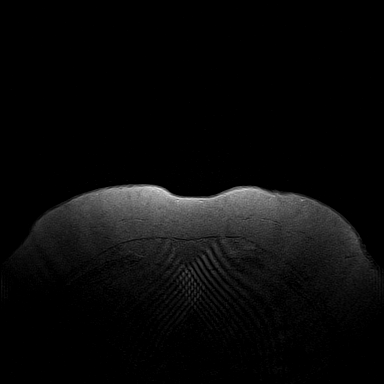
[im 36/144]
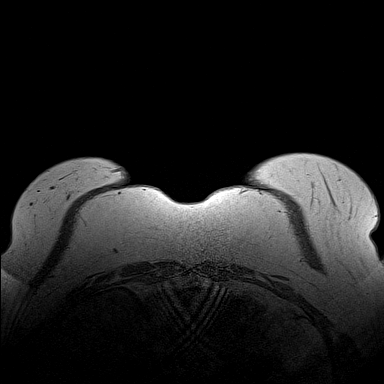
[im 72/144]
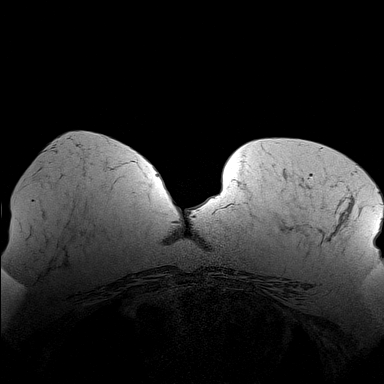
[im 108/144]
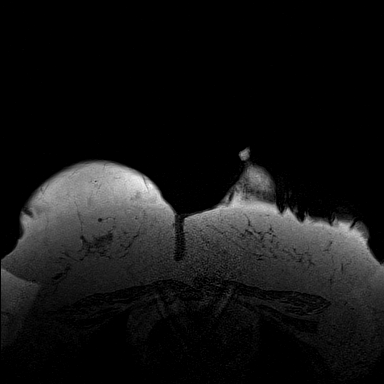
[im 144/144]
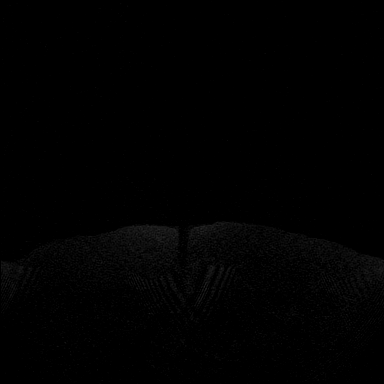

[Series 4: fl3d pre-cm · axial · non-contrast · 1.2mm · 0.99mm/px · z∈[-110,+61]mm · 5 of 144 slices shown]
[im 1/144]
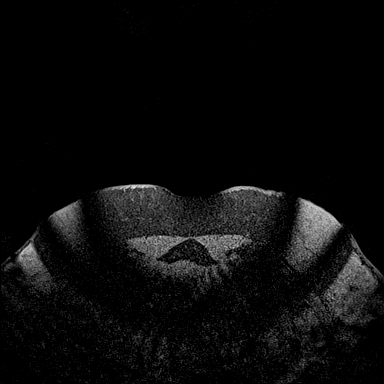
[im 36/144]
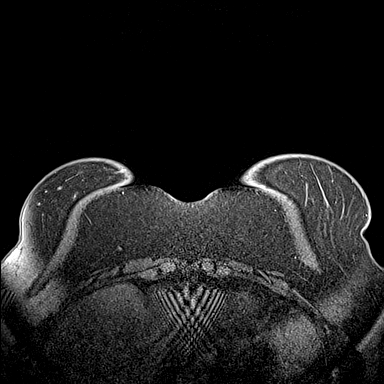
[im 72/144]
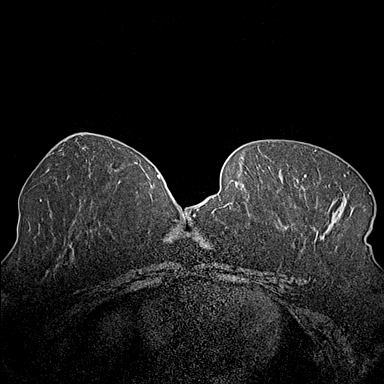
[im 108/144]
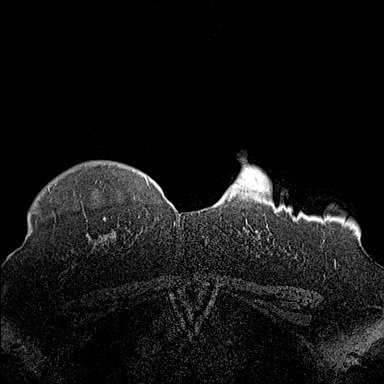
[im 144/144]
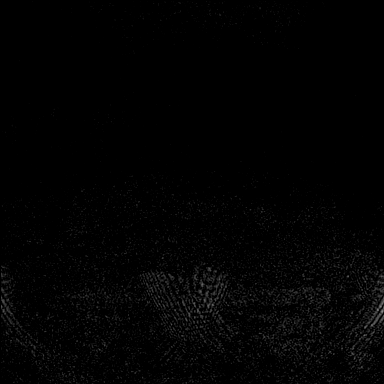

[Series 5: fl3d post-cm 20 · axial · 1.2mm · 0.99mm/px · z∈[-110,+61]mm · 5 of 144 slices shown (1 of 3)]
[im 1/144]
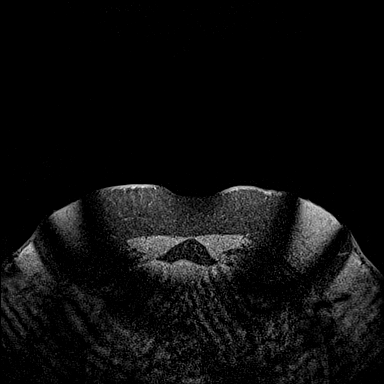
[im 36/144]
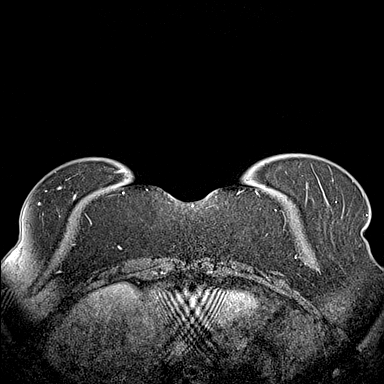
[im 72/144]
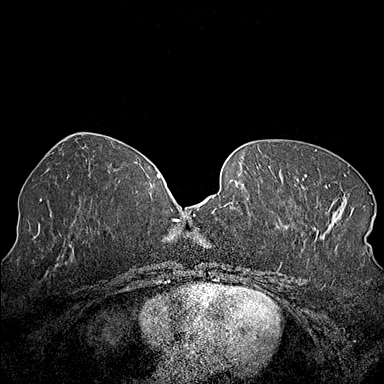
[im 108/144]
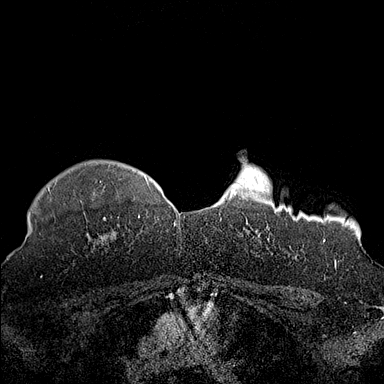
[im 144/144]
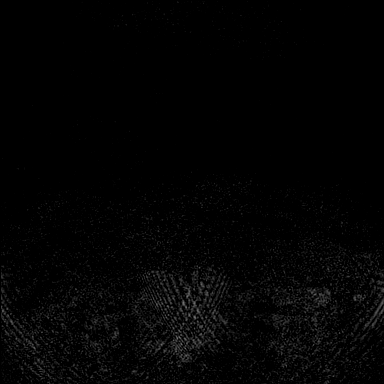

[Series 6: fl3d post-cm 20 · axial · 1.2mm · 0.99mm/px · z∈[-110,+61]mm · 5 of 144 slices shown (2 of 3)]
[im 1/144]
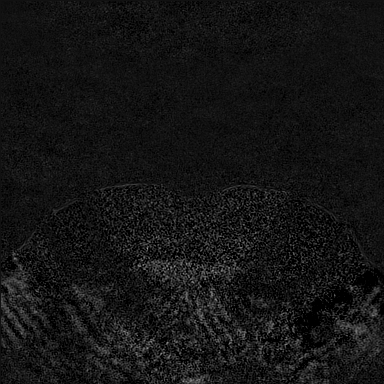
[im 36/144]
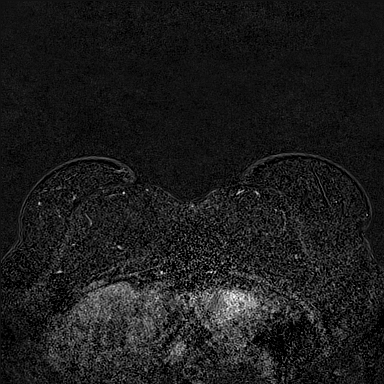
[im 72/144]
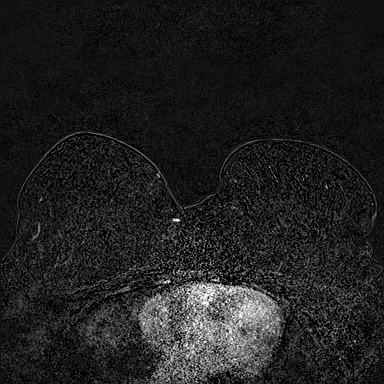
[im 108/144]
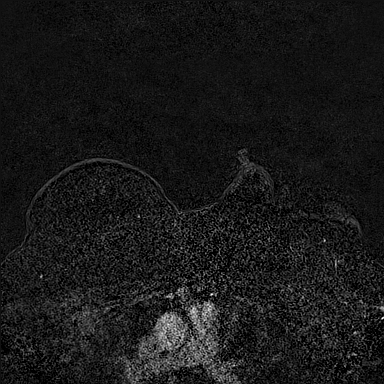
[im 144/144]
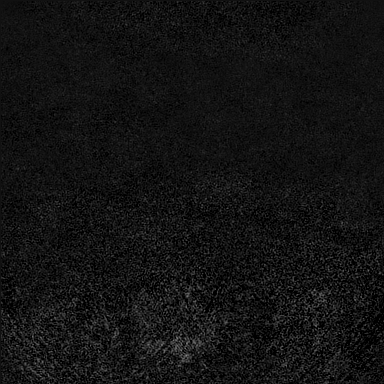

[Series 7: fl3d post-cm 20 · axial · 172.8mm · 0.99mm/px · 1 of 1 slices shown (3 of 3)]
[im 1/1]
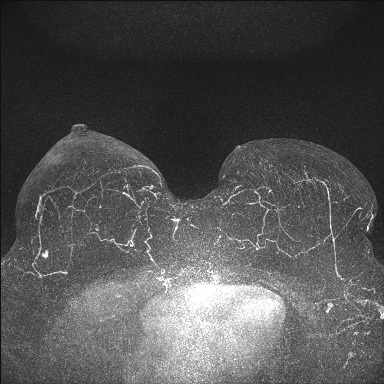

[Series 8: fl3d post-cm 3 · axial · 1.2mm · 0.99mm/px · z∈[-110,+61]mm · 6 of 144 slices shown (1 of 2)]
[im 1/144]
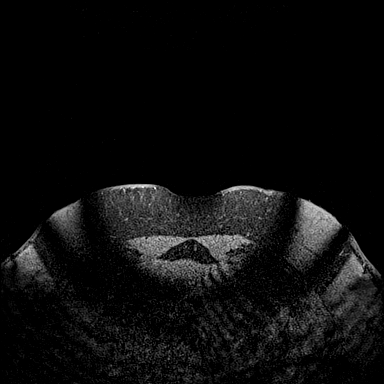
[im 29/144]
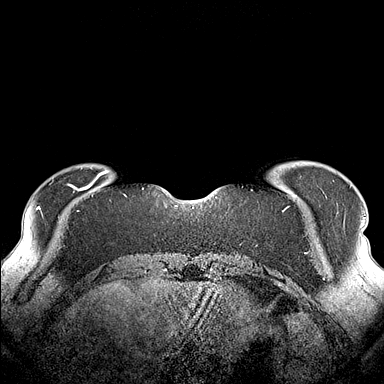
[im 58/144]
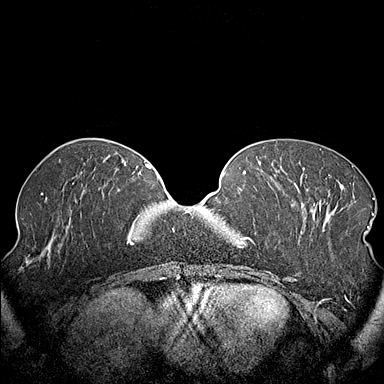
[im 86/144]
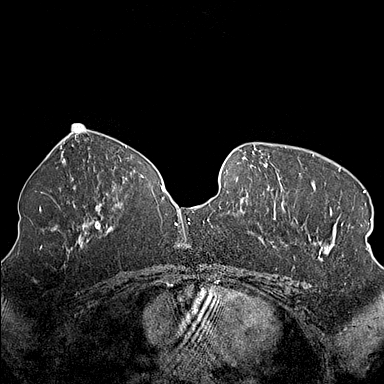
[im 115/144]
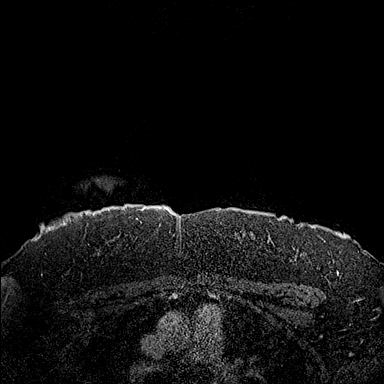
[im 144/144]
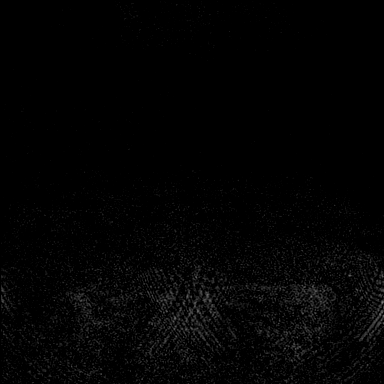

[Series 9: fl3d post-cm 3 · axial · 1.2mm · 0.99mm/px · z∈[-110,-8]mm · 4 of 144 slices shown (2 of 2)]
[im 1/144]
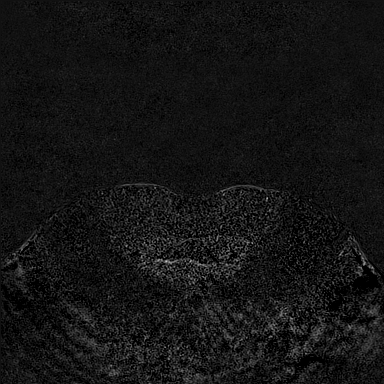
[im 29/144]
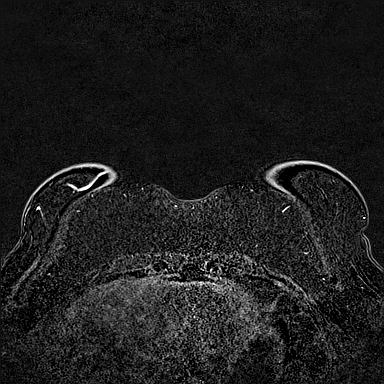
[im 58/144]
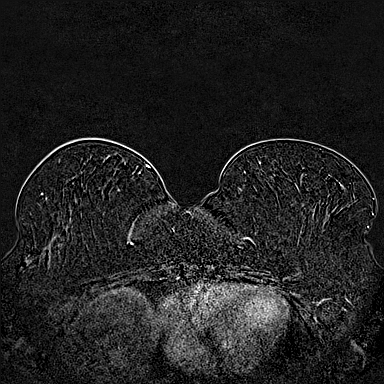
[im 86/144]
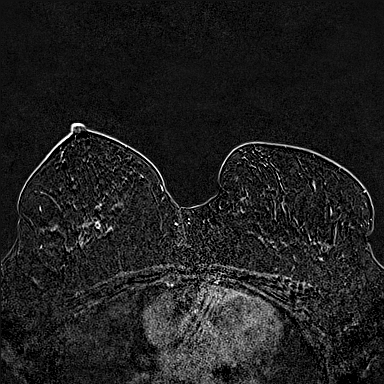

[32 of 48 positions shown; findings below may reference images not displayed]

Three-dimensional MR images were rendered by post-processing of the
original MR data on an independent workstation. The
three-dimensional MR images were interpreted, and findings are
reported in the following complete MRI report for this study. Three
dimensional images were evaluated at the independent interpreting
workstation using the DynaCAD thin client.
FINDINGS: Breast composition: b. Scattered fibroglandular tissue.

Background parenchymal enhancement: Minimal

Right breast: The previously identified 1.8 cm biopsy-proven UPPER
OUTER RIGHT breast malignancy is no longer identified, compatible
with treatment response.

Other intraparenchymal lymph node/benign changes within the RIGHT
breast are less apparent as well.

No new or suspicious findings are noted within the RIGHT breast.

Left breast: No suspicious mass or abnormal enhancement. No new or
suspicious findings are noted within the LEFT breast.

Lymph nodes: No abnormal lymph nodes are identified. The previously
identified biopsy-proven abnormal RIGHT axillary lymph node
containing metastatic disease is no longer visualized.

Ancillary findings:  None.
IMPRESSION: 1. Treatment imaging response with no further visualization of known
UPPER-OUTER RIGHT breast cancer and abnormal RIGHT axillary lymph
node containing metastatic disease.
2. No new or suspicious findings within either breast.

RECOMMENDATION:
Treatment plan

BI-RADS CATEGORY  6: Known biopsy-proven malignancy.

## 2021-07-20 MED ORDER — GADOBUTROL 1 MMOL/ML IV SOLN
10.0000 mL | Freq: Once | INTRAVENOUS | Status: AC | PRN
  Administered 2021-07-20: 10 mL via INTRAVENOUS

## 2021-07-20 MED ORDER — GADOBUTROL 1 MMOL/ML IV SOLN: 10.0000 mL | Freq: Once | INTRAVENOUS | Status: DC | PRN

## 2021-07-21 ENCOUNTER — Encounter: Payer: Self-pay | Admitting: *Deleted

## 2021-07-22 ENCOUNTER — Other Ambulatory Visit: Payer: Self-pay | Admitting: General Surgery

## 2021-07-22 DIAGNOSIS — C50911 Malignant neoplasm of unspecified site of right female breast: Secondary | ICD-10-CM

## 2021-07-22 DIAGNOSIS — N6092 Unspecified benign mammary dysplasia of left breast: Secondary | ICD-10-CM | POA: Insufficient documentation

## 2021-07-26 ENCOUNTER — Encounter: Payer: Self-pay | Admitting: *Deleted

## 2021-07-26 DIAGNOSIS — Z170 Estrogen receptor positive status [ER+]: Secondary | ICD-10-CM

## 2021-07-27 ENCOUNTER — Encounter: Payer: Self-pay | Admitting: *Deleted

## 2021-07-27 ENCOUNTER — Other Ambulatory Visit: Payer: Self-pay | Admitting: General Surgery

## 2021-07-27 ENCOUNTER — Telehealth: Payer: Self-pay | Admitting: Hematology and Oncology

## 2021-07-27 DIAGNOSIS — N6092 Unspecified benign mammary dysplasia of left breast: Secondary | ICD-10-CM

## 2021-07-27 DIAGNOSIS — Z170 Estrogen receptor positive status [ER+]: Secondary | ICD-10-CM

## 2021-07-27 DIAGNOSIS — C50911 Malignant neoplasm of unspecified site of right female breast: Secondary | ICD-10-CM

## 2021-07-27 NOTE — Telephone Encounter (Signed)
Sch per 1/10 inbasket, pt aware °

## 2021-07-28 ENCOUNTER — Ambulatory Visit: Payer: BC Managed Care – PPO | Attending: Hematology and Oncology

## 2021-07-28 ENCOUNTER — Other Ambulatory Visit: Payer: Self-pay

## 2021-07-28 DIAGNOSIS — Z170 Estrogen receptor positive status [ER+]: Secondary | ICD-10-CM | POA: Insufficient documentation

## 2021-07-28 DIAGNOSIS — R293 Abnormal posture: Secondary | ICD-10-CM | POA: Insufficient documentation

## 2021-07-28 DIAGNOSIS — C50411 Malignant neoplasm of upper-outer quadrant of right female breast: Secondary | ICD-10-CM | POA: Diagnosis present

## 2021-07-28 NOTE — Patient Instructions (Signed)
Physical Therapy Information for After Breast Cancer Surgery/Treatment:  Lymphedema is a swelling condition that you may be at risk for in your arm if you have lymph nodes removed from the armpit area.  After a sentinel node biopsy, the risk is approximately 5-9% and is higher after an axillary node dissection.  There is treatment available for this condition and it is not life-threatening.  Contact your physician or physical therapist with concerns. You may begin the 4 shoulder/posture exercises (see additional sheet) when permitted by your physician (typically a week after surgery).  If you have drains, you may need to wait until those are removed before beginning range of motion exercises.  A general recommendation is to not lift your arms above shoulder height until drains are removed.  These exercises should be done to your tolerance and gently.  This is not a "no pain/no gain" type of recovery so listen to your body and stretch into the range of motion that you can tolerate, stopping if you have pain.  If you are having immediate reconstruction, ask your plastic surgeon about doing exercises as he or she may want you to wait. We encourage you to attend the free one time ABC (After Breast Cancer) class offered by [facility].  You will learn information related to lymphedema risk, prevention and treatment and additional exercises to regain mobility following surgery.  You can call [phone] for more information.  This is offered the 1st and 3rd Monday of each month.  You only attend the class one time. While undergoing any medical procedure or treatment, try to avoid blood pressure being taken or needle sticks from occurring on the arm on the side of cancer.   This recommendation begins after surgery and continues for the rest of your life.  This may help reduce your risk of getting lymphedema (swelling in your arm). An excellent resource for those seeking information on  lymphedema is the National Lymphedema Network's web site. It can be accessed at [facility]org If you notice swelling in your hand, arm or breast at any time following surgery (even if it is many years from now), please contact your doctor or physical therapist to discuss this.  Lymphedema can be treated at any time but it is easier for you if it is treated early on.  If you feel like your shoulder motion is not returning to normal in a reasonable amount of time, please contact your surgeon or physical therapist.  [facility]. [place], [place], [place] [id number]; [address], [place], [place] [address] ABC CLASS After Breast Cancer Class  After Breast Cancer Class is a specially designed exercise class to assist you in a safe recover after having breast cancer surgery.  In this class you will learn how to get back to full function whether your drains were just removed or if you had surgery a month ago.  This one-time class is held the 1st and 3rd Monday of every month from 11:00 a.m. until 12:00 noon at the [facility] located at [place], [place] [address]  This class is FREE and space is limited. For more information or to register for the next available class, call [phone].  Class Goals  Understand specific stretches to improve the flexibility of you chest and shoulder. Learn ways to safely strengthen your upper body and improve your posture. Understand the warning signs of infection and why you may be at risk for an arm infection.  Learn about Lymphedema and prevention.  ** You do not attend this class until after surgery.  Drains must be removed to participate  Patient was instructed today in a home exercise program today for post op shoulder range of motion. These included active assist shoulder flexion in sitting/supine, scapular retraction, wall walking with shoulder abduction, and hands behind head external rotation.  She was encouraged to  do these twice a day, holding 3 seconds and repeating 5 times when permitted by her physician.

## 2021-07-28 NOTE — Therapy (Signed)
[facility] @ [address] [place], [place], [address] Phone: [phone]   Fax:  [phone]  Physical Therapy Evaluation  Patient Details  Name: Sandra Bishop MRN: [id number] Date of Birth: [birth year] Referring Provider (PT): Dr. Lindi Adie   Encounter Date: 07/28/2021   PT End of Session - 07/28/21 1204     Visit Number 1    Number of Visits 2    Date for PT Re-Evaluation 09/22/21    PT Start Time 1103    PT Stop Time 1155    PT Time Calculation (min) 52 min    Activity Tolerance Patient tolerated treatment well    Behavior During Therapy [facility] for tasks assessed/performed             Past Medical History:  Diagnosis Date   Allergy    Anemia    Breast cancer ([place])    Right   Constipation    Diabetes mellitus without complication ([place])    Family history of breast cancer    Family history of colon cancer    Family history of lung cancer    Family history of pancreatic cancer    History of migraine    Obesity    Palpitations     Past Surgical History:  Procedure Laterality Date   BREAST BIOPSY Right 02/11/2021   x2   BREAST BIOPSY Right 03/22/2021   x3   EYE SURGERY Right    PORTACATH PLACEMENT N/A 03/02/2021   Procedure: INSERTION PORT-A-CATH;  Surgeon: [name], MD;  Location: [facility];  Service: General;  Laterality: N/A;   TUBAL LIGATION      There were no vitals filed for this visit.    Subjective Assessment - 07/28/21 1201     Subjective Pt is here for pre-surgical screen. She has finished neoadjuvant chemo and will have right lumpectomy with SLNB on 08/17/2021 followed by radiation.    Pertinent History Pts.Screening mammogram determined  mass in the right breast  Diagnostic mammogram and [place]: a persistent irregular mass 1.2 cm within the upper-outer right breast. 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 Pt has  finished neoadjuvant chemo and will have right Lumpectomy with SLNB on 08/17/2021    Patient Stated Goals Here for pre-surgery screen and to gain info from providers    Currently in Pain? No/denies                [facility] PT Assessment - 07/28/21 0001       Assessment   Medical Diagnosis Right breast Cancer    Referring Provider (PT) Dr. Lindi Adie    Hand Dominance Right    Prior Therapy yes      Precautions   Precaution Comments will be lymphedema risk      Restrictions   Weight Bearing Restrictions No      Balance Screen   Has the patient fallen in the past 6 months No    Has the patient had a decrease in activity level because of a fear of falling?  No    Is the patient reluctant to leave their home because of a fear of falling?  No      Home Ecologist residence    Living Arrangements Alone;Children    Available Help at Discharge Family    Type of Home House      Prior Function   Level of  Independence Independent    Vocation Full time employment    Vocation Requirements [employer], lifting    Leisure walking daily 30 min or more      Cognition   Overall Cognitive Status Within Functional Limits for tasks assessed      Posture/Postural Control   Posture Comments FWD head, round shoulder      AROM   Right Shoulder Extension 46 Degrees    Right Shoulder Flexion 160 Degrees    Right Shoulder ABduction 171 Degrees    Right Shoulder Internal Rotation 62 Degrees    Right Shoulder External Rotation 108 Degrees    Left Shoulder Extension 51 Degrees    Left Shoulder Flexion 158 Degrees    Left Shoulder ABduction 158 Degrees    Left Shoulder Internal Rotation 60 Degrees    Left Shoulder External Rotation 107 Degrees               LYMPHEDEMA/ONCOLOGY QUESTIONNAIRE - 07/28/21 0001       Type   Cancer Type Right breast CA.      Surgeries   Lumpectomy Date 08/17/21    Sentinel Lymph Node Biopsy Date 08/17/21       Treatment   Active Chemotherapy Treatment No    Past Chemotherapy Treatment Yes    Date 07/01/21   ended   Active Radiation Treatment No    Past Radiation Treatment No    Current Hormone Treatment No    Past Hormone Therapy No      What other symptoms do you have   Are you Having Heaviness or Tightness No    Are you having Pain No    Are you having pitting edema No    Is it Hard or Difficult finding clothes that fit No    Do you have infections No    Is there Decreased scar mobility No    Stemmer Sign No      Right Upper Extremity Lymphedema   10 cm Proximal to Olecranon Process 38.9 cm    Olecranon Process 27.7 cm    10 cm Proximal to Ulnar Styloid Process 24 cm    Just Proximal to Ulnar Styloid Process 18.1 cm    At Base of 2nd Digit 6.9 cm      Left Upper Extremity Lymphedema   10 cm Proximal to Olecranon Process 39.2 cm    Olecranon Process 28.4 cm    10 cm Proximal to Ulnar Styloid Process 23.7 cm    Just Proximal to Ulnar Styloid Process 18 cm    At Base of 2nd Digit 6.6 cm                   Quick Dash - 07/28/21 0001     Open a tight or new jar No difficulty    Do heavy household chores (wash walls, wash floors) No difficulty    Carry a shopping bag or briefcase No difficulty    Wash your back Mild difficulty    Use a knife to cut food No difficulty    Recreational activities in which you take some force or impact through your arm, shoulder, or hand (golf, hammering, tennis) No difficulty    During the past week, to what extent has your arm, shoulder or hand problem interfered with your normal social activities with family, friends, neighbors, or groups? Not at all    During the past week, to what extent has your arm, shoulder or hand  problem limited your work or other regular daily activities Not at all    Arm, shoulder, or hand pain. Mild    Tingling (pins and needles) in your arm, shoulder, or hand Mild    Difficulty Sleeping No difficulty     DASH Score 6.82 %              Objective measurements completed on examination: See above findings.                PT Education - 07/28/21 1204     Education Details 4 post op exs, ABC class, Lymphedema, SOZO screens    Person(s) Educated Patient    Methods Explanation;Handout    Comprehension Verbalized understanding                 PT Long Term Goals - 07/28/21 1211       PT LONG TERM GOAL #1   Title Pt will regain right shoulder AROM within 5-10 degrees of baseline for ability to peform home and work activities    Time 8    Period Weeks    Status New    Target Date 09/22/21             Breast Clinic Goals - 07/28/21 1211       Patient will be able to verbalize understanding of pertinent lymphedema risk reduction practices relevant to her diagnosis specifically related to skin care.   Time 1    Period Days    Status Achieved    Target Date 07/28/21      Patient will be able to return demonstrate and/or verbalize understanding of the post-op home exercise program related to regaining shoulder range of motion.   Time 1    Period Days    Status Achieved    Target Date 07/28/21      Patient will be able to verbalize understanding of the importance of attending the postoperative After Breast Cancer Class for further lymphedema risk reduction education and therapeutic exercise.   Time 1    Period Days    Status Achieved    Target Date 07/28/21                   Plan - 07/28/21 1205     Clinical Impression Statement Pt is here for pre-surgery screen for her right breast Cancer.  She had neoadjuvant chemo and will have right lumpectomy with SLNB on 08/17/2021.  Baseline measures for shoulder ROM, circumference of arms and SOZO were taken. Pt was educated in 4 post op exercises to perform when given permission by MD, ABC class was set up today, and SOZO screen was set for May. She was educated in lymphedema risk and reduction practices.  She was advised to call with any questions or concerns    Stability/Clinical Decision Making Stable/Uncomplicated    Clinical Decision Making Low    Rehab Potential Excellent    PT Frequency 1x / week   1x visit after surgery   PT Treatment/Interventions Therapeutic exercise;Patient/family education    PT Next Visit Plan Reassess post op to determine further PT needs    Recommended Other Services ABC, every 3 month SOZO screens    Consulted and Agree with Plan of Care Patient            Patient will follow up at outpatient cancer rehab 3-4 weeks following surgery.  If the patient requires physical therapy at that time, a specific plan will be dictated and  sent to the referring physician for approval. The patient was educated today on appropriate basic range of motion exercises to begin post operatively and the importance of attending the After Breast Cancer class following surgery.  Patient was educated today on lymphedema risk reduction practices as it pertains to recommendations that will benefit the patient immediately following surgery.  She verbalized good understanding.   The patient was assessed using the L-Dex machine today to produce a lymphedema index baseline score. The patient will be reassessed on a regular basis (typically every 3 months) to obtain new L-Dex scores. If the score is > 6.5 points away from his/her baseline score indicating onset of subclinical lymphedema, it will be recommended to wear a compression garment for 4 weeks, 12 hours per day and then be reassessed. If the score continues to be > 6.5 points from baseline at reassessment, we will initiate lymphedema treatment. Assessing in this manner has a 95% rate of preventing clinically significant lymphedema.  Patient will benefit from skilled therapeutic intervention in order to improve the following deficits and impairments:  Decreased knowledge of precautions, Postural dysfunction  Visit Diagnosis: Abnormal  posture  Malignant neoplasm of upper-outer quadrant of right breast in female, estrogen receptor positive [facility])     Problem List Patient Active Problem List   Diagnosis Date Noted   Hyperglycemia due to type 2 diabetes mellitus ([facility]) 07/08/2021   Pure hypercholesterolemia 07/08/2021   Morbid obesity ([place]) 07/08/2021   Chemotherapy-induced peripheral neuropathy ([place]) 07/08/2021   Genetic testing 03/17/2021   Port-A-Cath in place 03/03/2021   Family history of breast cancer 02/24/2021   Family history of pancreatic cancer 02/24/2021   Family history of colon cancer 02/24/2021   Family history of lung cancer 02/24/2021   Malignant neoplasm of upper-outer quadrant of right breast in female, estrogen receptor positive ([place]) 02/15/2021   Obesity    Constipation 08/30/2011    Claris Pong, PT 07/28/2021, 12:13 PM  [facility] @ [place], [place], [address] Phone: [phone]   Fax:  [phone]  Name: Sandra Bishop MRN: [id number] Date of Birth: [birth year]

## 2021-08-04 ENCOUNTER — Encounter: Payer: Self-pay | Admitting: *Deleted

## 2021-08-05 ENCOUNTER — Inpatient Hospital Stay: Payer: BC Managed Care – PPO | Admitting: Hematology and Oncology

## 2021-08-05 ENCOUNTER — Inpatient Hospital Stay: Payer: BC Managed Care – PPO

## 2021-08-09 NOTE — Progress Notes (Signed)
Established Patient Office Visit  Subjective:  Patient ID: Sandra Bishop, female    DOB: 1971/02/13  Age: 51 y.o. MRN: [id number]  CC:  Chief Complaint  Patient presents with   Medication Reaction    Patient state she has been having a reaction to Rosuvastatin she states she has been having pain in legs and feet. Per patient she has been dizzy as well , and it makes it hard to walk    HPI Sandra Bishop presents for med reaction  Had recently been started on rosuvastatin Within days noted muscle aches, worse in legs and feet. Some ongoing dizziness. Has stopped medication, some improvement, but no resolution.  Otherwise no acute concerns.   Past Medical History:  Diagnosis Date   Allergy    Anemia    Breast cancer ([place])    Right   Constipation    Diabetes mellitus without complication ([place])    Family history of breast cancer    Family history of colon cancer    Family history of lung cancer    Family history of pancreatic cancer    History of migraine    Obesity    Palpitations     Past Surgical History:  Procedure Laterality Date   BREAST BIOPSY Right 02/11/2021   x2   BREAST BIOPSY Right 03/22/2021   x3   EYE SURGERY Right    PORTACATH PLACEMENT N/A 03/02/2021   Procedure: INSERTION PORT-A-CATH;  Surgeon: [name], MD;  Location: [facility];  Service: General;  Laterality: N/A;   TUBAL LIGATION      Family History  Problem Relation Age of Onset   Hypertension Mother    Pancreatic cancer Father 60   Breast cancer Half-Sister 28       negative genetic testing   Colon cancer Maternal Uncle        dx >50   Lung cancer Maternal Uncle        dx 66s, hx smoking   Cancer Paternal Aunt        unknown type, dx early 74s   Heart failure Maternal Grandmother    Breast cancer Other 67       mother's first cousin    Social History   Socioeconomic History   Marital status: Divorced    Spouse name: Not on file   Number of children: 2   Years of  education: Not on file   Highest education level: Not on file  Occupational History   Not on file  Tobacco Use   Smoking status: Former    Types: Cigarettes    Quit date: 12/28/2010    Years since quitting: 10.6   Smokeless tobacco: Never   Tobacco comments:    quit in 2012, previously smoked 15 years  Vaping Use   Vaping Use: Not on file  Substance and Sexual Activity   Alcohol use: Not Currently    Comment: occ   Drug use: No   Sexual activity: Yes    Birth control/protection: Surgical  Other Topics Concern   Not on file  Social History Narrative   Not on file   Social Determinants of Health   Financial Resource Strain: Not on file  Food Insecurity: Not on file  Transportation Needs: Not on file  Physical Activity: Not on file  Stress: Not on file  Social Connections: Not on file  Intimate Partner Violence: Not on file    Outpatient Medications Prior to Visit  Medication Sig Dispense Refill  blood glucose meter kit and supplies Dispense based on patient and insurance preference. Use up to four times daily as directed. (FOR ICD-10 E10.9, E11.9).  ( One-Touch Verio meter) 1 each 0   Lancets (ONETOUCH ULTRASOFT) lancets Use to check blood glucose three times daily. Use as instructed. E11.9. 100 each 12   ONETOUCH VERIO test strip TEST FOUR TIMES DAILY AS DIRECTED 100 strip 2   aspirin 81 MG chewable tablet Chew 81 mg by mouth daily.     naproxen (NAPROSYN) 500 MG tablet Take 1 tablet (500 mg total) by mouth 2 (two) times daily. 30 tablet 0   rosuvastatin (CRESTOR) 5 MG tablet Take 1 tablet (5 mg total) by mouth once a week. 12 tablet 3   Vitamin D, Ergocalciferol, (DRISDOL) 1.25 MG (50000 UNIT) CAPS capsule Take 1 capsule (50,000 Units total) by mouth every 7 (seven) days. 12 capsule 1   No facility-administered medications prior to visit.    No Known Allergies  ROS Review of Systems  Constitutional: Negative.   HENT: Negative.    Eyes: Negative.    Respiratory: Negative.    Cardiovascular: Negative.   Gastrointestinal: Negative.   Genitourinary: Negative.   Musculoskeletal: Negative.   Skin: Negative.   Neurological: Negative.   Psychiatric/Behavioral: Negative.    All other systems reviewed and are negative.    Objective:    Physical Exam Vitals and nursing note reviewed.  Constitutional:      General: She is not in acute distress.    Appearance: Normal appearance. She is normal weight. She is not ill-appearing, toxic-appearing or diaphoretic.  Cardiovascular:     Rate and Rhythm: Normal rate and regular rhythm.     Heart sounds: Normal heart sounds. No murmur heard.   No friction rub. No gallop.  Pulmonary:     Effort: Pulmonary effort is normal. No respiratory distress.     Breath sounds: Normal breath sounds. No stridor. No wheezing, rhonchi or rales.  Chest:     Chest wall: No tenderness.  Abdominal:     General: Abdomen is flat. Bowel sounds are normal. There is no distension.     Tenderness: There is no abdominal tenderness.  Musculoskeletal:        General: Tenderness present. No swelling, deformity or signs of injury. Normal range of motion.  Skin:    General: Skin is warm and dry.  Neurological:     General: No focal deficit present.     Mental Status: She is alert and oriented to person, place, and time. Mental status is at baseline.  Psychiatric:        Mood and Affect: Mood normal.        Behavior: Behavior normal.        Thought Content: Thought content normal.        Judgment: Judgment normal.    BP (!) 146/95    Pulse 100    Temp 98.2 F (36.8 C) (Temporal)    Resp 18    Ht _0  (1.626 m)    Wt 230 lb 12.8 oz (104.7 kg)    SpO2 100%    BMI 39.62 kg/m  Wt Readings from Last 3 Encounters:  07/08/21 231 lb 3.2 oz (104.9 kg)  07/01/21 229 lb 12.8 oz (104.2 kg)  06/23/21 229 lb 8 oz (104.1 kg)     Health Maintenance Due  Topic Date Due   Zoster Vaccines- Shingrix (1 of 2) Never done   FOOT  EXAM  03/04/2020  OPHTHALMOLOGY EXAM  03/10/2020   COVID-19 Vaccine (3 - Pfizer risk series) 04/03/2020   URINE MICROALBUMIN  07/13/2020   INFLUENZA VACCINE  Never done   PAP SMEAR-Modifier  09/24/2021    There are no preventive care reminders to display for this patient.  Lab Results  Component Value Date   TSH 0.717 08/11/2020   Lab Results  Component Value Date   WBC 5.4 07/08/2021   HGB 10.3 (L) 07/08/2021   HCT 31.9 (L) 07/08/2021   MCV 92.2 07/08/2021   PLT 271 07/08/2021   Lab Results  Component Value Date   NA 137 07/08/2021   K 4.0 07/08/2021   CO2 25 07/08/2021   GLUCOSE 224 (H) 07/08/2021   BUN 12 07/08/2021   CREATININE 0.78 07/08/2021   BILITOT 0.3 07/08/2021   ALKPHOS 76 07/08/2021   AST 19 07/08/2021   ALT 19 07/08/2021   PROT 7.6 07/08/2021   ALBUMIN 3.8 07/08/2021   CALCIUM 9.2 07/08/2021   ANIONGAP 7 07/08/2021   Lab Results  Component Value Date   CHOL 132 08/11/2020   Lab Results  Component Value Date   HDL 52 08/11/2020   Lab Results  Component Value Date   LDLCALC 64 08/11/2020   Lab Results  Component Value Date   TRIG 80 08/11/2020   Lab Results  Component Value Date   CHOLHDL 2.5 08/11/2020   Lab Results  Component Value Date   HGBA1C 7.1 (H) 03/02/2021      Assessment & Plan:   Problem List Items Addressed This Visit   None Visit Diagnoses     Myalgia    -  Primary   Relevant Orders   CBC With Differential (Completed)   Comprehensive metabolic panel (Completed)   Hemoglobin A1c (Completed)   Lipid panel (Completed)   TSH (Completed)   Vitamin D, 25-hydroxy (Completed)   Vitamin B12 (Completed)   Magnesium (Completed)   CK (Completed)   Dizziness       Relevant Orders   CBC With Differential (Completed)   Comprehensive metabolic panel (Completed)   Hemoglobin A1c (Completed)   Lipid panel (Completed)   TSH (Completed)   Vitamin D, 25-hydroxy (Completed)   Vitamin B12 (Completed)   Magnesium  (Completed)   CK (Completed)       No orders of the defined types were placed in this encounter.   Follow-up: No follow-ups on file.   PLAN Muscle aches noted on exam. Otherwise unremarkable exam. Labs collected. Will follow up with the patient as warranted. Hold statin. Can consider cardiology referral for lipid management if needed.  Patient encouraged to call clinic with any questions, comments, or concerns.  Maximiano Coss, NP

## 2021-08-11 NOTE — Progress Notes (Signed)
Surgical Instructions   Your procedure is scheduled on Wednesday 08/17/2021.  Report to [facility] Main Entrance "A" at 09:45 A.M., then check in with the Admitting office.  Call [phone] if you have problems or questions between now and the morning of surgery:   Remember: Do not eat after midnight the night before your surgery  You may drink clear liquids until 08:45 a.m. the morning of your surgery.   Clear liquids allowed are: Water, Non-Citrus Juices (without pulp), Carbonated Beverages, Clear Tea, Black Coffee Only (NO MILK, CREAM, or POWDERED CREAMER of any kind), and Gatorade  Please complete your PRE-SURGERY G2 that was provided to you by 08:45 a.m. the morning of surgery.  Please, if able, drink it in one sitting. DO NOT SIP.  Do not drink anything else after completing your G2.   DO NOT take any medications the morning of surgery  As of today, STOP taking any Aspirin (unless otherwise instructed by your surgeon) or Aspirin-containing products; NSAIDS - Aleve, Naproxen, Ibuprofen, Motrin, Advil, Goody's, BC's, all herbal medications, fish oil, and all vitamins.   WHAT DO I DO ABOUT MY DIABETES MEDICATION?  Do not take oral diabetes medicines (pills) the morning of surgery.   HOW TO MANAGE YOUR DIABETES BEFORE AND AFTER SURGERY  Why is it important to control my blood sugar before and after surgery? Improving blood sugar levels before and after surgery helps healing and can limit problems. A way of improving blood sugar control is eating a healthy diet by:  Eating less sugar and carbohydrates  Increasing activity/exercise  Talking with your doctor about reaching your blood sugar goals High blood sugars (greater than 180 mg/dL) can raise your risk of infections and slow your recovery, so you will need to focus on controlling your diabetes during the weeks before surgery. Make sure that the doctor who takes care of your diabetes knows about your planned surgery including  the date and location.  How do I manage my blood sugar before surgery? Check your blood sugar at least 4 times a day, starting 2 days before surgery, to make sure that the level is not too high or low.  Check your blood sugar the morning of your surgery when you wake up and every 2 hours until you get to the Short Stay unit.  If your blood sugar is less than 70 mg/dL, you will need to treat for low blood sugar: Do not take insulin. Treat a low blood sugar (less than 70 mg/dL) with  cup of clear juice (cranberry or apple), 4 glucose tablets, OR glucose gel. Recheck blood sugar in 15 minutes after treatment (to make sure it is greater than 70 mg/dL). If your blood sugar is not greater than 70 mg/dL on recheck, call [phone] for further instructions. Report your blood sugar to the short stay nurse when you get to Short Stay.  If you are admitted to the hospital after surgery: Your blood sugar will be checked by the staff and you will probably be given insulin after surgery (instead of oral diabetes medicines) to make sure you have good blood sugar levels. The goal for blood sugar control after surgery is 80-180 mg/dL.  3 days leading up to your surgery or after your pre-procedure COVID test You are not required to quarantine however you are required to wear a well-fitting mask when you are out and around people not in your household.  If your mask becomes wet or soiled, replace with a new one.  Wash your hands often with soap and water for 20 seconds or clean your hands with an alcohol-based hand sanitizer that contains at least 60% alcohol.  Do not share personal items.  Notify your provider: if you are in close contact with someone who has COVID  or if you develop a fever of 100.4 or greater, sneezing, cough, sore throat, shortness of breath or body aches.          Do not wear jewelry or makeup  Do not wear lotions, powders, perfumes/colognes, or deodorant.  Do not shave 48 hours  prior to surgery.  Men may shave face and neck.  Do not wear nail polish, gel polish, artificial nails, or any other type of covering on natural nails including fingernails and toenails. If patients have artificial nails, gel coating, etc. that need to be removed by a nail salon please have this removed prior to surgery or surgery may need to be canceled/delayed if the surgeon/ anesthesia feels like the patient is unable to be adequately monitored.  Do not bring valuables to the hospital - [facility] is not responsible for any belongings or valuables.  Do NOT Smoke (Tobacco/Vaping) or drink Alcohol 24 hours prior to your procedure  If you use a CPAP at night, please bring your mask for your overnight stay.   Contacts, glasses, hearing aids, dentures or partials may not be worn into surgery, please bring cases for these belongings   For patients admitted to the hospital, discharge time will be determined by your treatment team.   Patients discharged the day of surgery will not be allowed to drive home, and someone needs to stay with them for 24 hours.  NO VISITORS WILL BE ALLOWED IN PRE-OP WHERE PATIENTS ARE PREPPED FOR SURGERY.  ONLY 1 SUPPORT PERSON MAY BE PRESENT IN THE WAITING ROOM WHILE YOU ARE IN SURGERY.  IF YOU ARE TO BE ADMITTED, ONCE YOU ARE IN YOUR ROOM YOU WILL BE ALLOWED TWO (2) VISITORS. 1 (ONE) VISITOR MAY STAY OVERNIGHT BUT MUST ARRIVE TO THE ROOM BY 8pm.  Minor children may have two parents present. Special consideration for safety and communication needs will be reviewed on a case by case basis.  Special instructions:    Oral Hygiene is also important to reduce your risk of infection.  Remember - BRUSH YOUR TEETH THE MORNING OF SURGERY WITH YOUR REGULAR TOOTHPASTE   [facility]- Preparing For Surgery  Before surgery, you can play an important role. Because skin is not sterile, your skin needs to be as free of germs as possible. You can reduce the number of germs on your  skin by washing with CHG (chlorahexidine gluconate) Soap before surgery.  CHG is an antiseptic cleaner which kills germs and bonds with the skin to continue killing germs even after washing.     Please do not use if you have an allergy to CHG or antibacterial soaps. If your skin becomes reddened/irritated stop using the CHG.  Do not shave (including legs and underarms) for at least 48 hours prior to first CHG shower. It is OK to shave your face.  Please follow these instructions carefully.     Shower the NIGHT BEFORE SURGERY and the MORNING OF SURGERY with CHG Soap.   If you chose to wash your hair, wash your hair first as usual with your normal shampoo. After you shampoo, rinse your hair and body thoroughly to remove the shampoo.    Then [employer] and genitals (private parts) with  your normal soap and rinse thoroughly to remove soap.  Next use the CHG Soap as you would any other liquid soap. You can apply CHG directly to the skin and wash gently with a clean washcloth.   Apply the CHG Soap to your body ONLY FROM THE NECK DOWN.  Do not use on open wounds or open sores. Avoid contact with your eyes, ears, mouth and genitals (private parts). Wash Face and genitals (private parts)  with your normal soap.   Wash thoroughly, paying special attention to the area where your surgery will be performed.  Thoroughly rinse your body with warm water from the neck down.  DO NOT shower/wash with your normal soap after using and rinsing off the CHG Soap.  Pat yourself dry with a CLEAN TOWEL.  Wear CLEAN PAJAMAS to bed the night before surgery  Place CLEAN SHEETS on your bed the night before your surgery  DO NOT SLEEP WITH PETS.   Day of Surgery:  Take a shower with CHG soap. Wear Clean/Comfortable clothing the morning of surgery Do not apply any deodorants/lotions.   Remember to brush your teeth WITH YOUR REGULAR TOOTHPASTE.   Please read over the fact sheets that you were given.

## 2021-08-12 ENCOUNTER — Encounter (HOSPITAL_COMMUNITY)
Admission: RE | Admit: 2021-08-12 | Discharge: 2021-08-12 | Disposition: A | Discharge status: Home / Self Care | Payer: BC Managed Care – PPO | Source: Ambulatory Visit | Attending: General Surgery | Admitting: General Surgery

## 2021-08-12 ENCOUNTER — Encounter (HOSPITAL_COMMUNITY): Payer: Self-pay

## 2021-08-12 ENCOUNTER — Other Ambulatory Visit: Payer: Self-pay

## 2021-08-12 VITALS — BP 137/88 | HR 89 | Temp 98.7°F | Resp 17 | Ht 64.0 in | Wt 227.0 lb

## 2021-08-12 DIAGNOSIS — Z01818 Encounter for other preprocedural examination: Secondary | ICD-10-CM

## 2021-08-12 DIAGNOSIS — Z01812 Encounter for preprocedural laboratory examination: Secondary | ICD-10-CM | POA: Insufficient documentation

## 2021-08-12 DIAGNOSIS — E1165 Type 2 diabetes mellitus with hyperglycemia: Secondary | ICD-10-CM

## 2021-08-12 HISTORY — DX: Family history of other specified conditions: Z84.89

## 2021-08-12 LAB — CBC
HCT: 38.8 % (ref 36.0–46.0)
Hemoglobin: 11.8 g/dL — ABNORMAL LOW (ref 12.0–15.0)
MCH: 27.4 pg (ref 26.0–34.0)
MCHC: 30.4 g/dL (ref 30.0–36.0)
MCV: 90 fL (ref 80.0–100.0)
Platelets: 233 10*3/uL (ref 150–400)
RBC: 4.31 MIL/uL (ref 3.87–5.11)
RDW: 13.4 % (ref 11.5–15.5)
WBC: 7.2 10*3/uL (ref 4.0–10.5)
nRBC: 0 % (ref 0.0–0.2)

## 2021-08-12 LAB — BASIC METABOLIC PANEL
Anion gap: 6 (ref 5–15)
BUN: 12 mg/dL (ref 6–20)
CO2: 25 mmol/L (ref 22–32)
Calcium: 9.3 mg/dL (ref 8.9–10.3)
Chloride: 104 mmol/L (ref 98–111)
Creatinine, Ser: 0.92 mg/dL (ref 0.44–1.00)
GFR, Estimated: >60 mL/min (ref >60)
Glucose, Bld: 154 mg/dL — ABNORMAL HIGH (ref 70–99)
Potassium: 3.9 mmol/L (ref 3.5–5.1)
Sodium: 135 mmol/L (ref 135–145)

## 2021-08-12 LAB — GLUCOSE, CAPILLARY: Glucose-Capillary: 161 mg/dL — ABNORMAL HIGH (ref 70–99)

## 2021-08-12 LAB — POCT PREGNANCY, URINE: Preg Test, Ur: NEGATIVE

## 2021-08-12 LAB — HEMOGLOBIN A1C
Hgb A1c MFr Bld: 7.7 % — ABNORMAL HIGH (ref 4.8–5.6)
Mean Plasma Glucose: 174.29 mg/dL

## 2021-08-12 NOTE — Progress Notes (Signed)
PCP - Dr. Sela Hilding Cardiologist -   PPM/ICD -  Device Orders -  Rep Notified -   Chest x-ray - 07/08/21 EKG - 12/24/20 Stress Test - 10/06/15 ECHO - 03/02/21 Cardiac Cath -   Sleep Study -  CPAP -   Fasting Blood Sugar - 140-170 Checks Blood Sugar 1 time a day  Blood Thinner Instructions: n/a Aspirin Instructions: last dose 08/10/21  ERAS Protcol - clears until 0830 PRE-SURGERY Ensure or G2- G2 given and to be completed by 0830  COVID TEST- n/a   Anesthesia review: yes, cardiac testing  Patient denies shortness of breath, fever, cough and chest pain at PAT appointment   All instructions explained to the patient, with a verbal understanding of the material. Patient agrees to go over the instructions while at home for a better understanding. Patient also instructed to self quarantine after being tested for COVID-19. The opportunity to ask questions was provided.

## 2021-08-16 ENCOUNTER — Ambulatory Visit
Admission: RE | Admit: 2021-08-16 | Discharge: 2021-08-16 | Disposition: A | Discharge status: Home / Self Care | Payer: BC Managed Care – PPO | Source: Ambulatory Visit | Attending: General Surgery | Admitting: General Surgery

## 2021-08-16 ENCOUNTER — Other Ambulatory Visit: Payer: Self-pay | Admitting: General Surgery

## 2021-08-16 DIAGNOSIS — C773 Secondary and unspecified malignant neoplasm of axilla and upper limb lymph nodes: Secondary | ICD-10-CM

## 2021-08-16 DIAGNOSIS — N6092 Unspecified benign mammary dysplasia of left breast: Secondary | ICD-10-CM

## 2021-08-16 DIAGNOSIS — C50911 Malignant neoplasm of unspecified site of right female breast: Secondary | ICD-10-CM

## 2021-08-16 IMAGING — US US NEEDLE LOCALIZATION*R*
1 series · 2 of 2 positions shown · non-contrast
Comparison: Previous exam(s).

CLINICAL DATA: Patient presents for radioactive seed localization
right breast malignancy, a metastatic right axillary lymph node, and
an area atypical lobular hyperplasia in the left breast.

EXAM:
MAMMOGRAPHIC GUIDED RADIOACTIVE SEED LOCALIZATION OF THE RIGHT
BREAST
MAMMOGRAPHIC GUIDED RADIOACTIVE SEED LOCALIZATION OF THE LEFT BREAST
ULTRASOUND GUIDED RADIOACTIVE SEED LOCALIZATION OF THE RIGHT AXILLA

[Series 1: us needle localization*right* · 0.06mm/px · 2 of 2 slices shown]
[im 1/2]
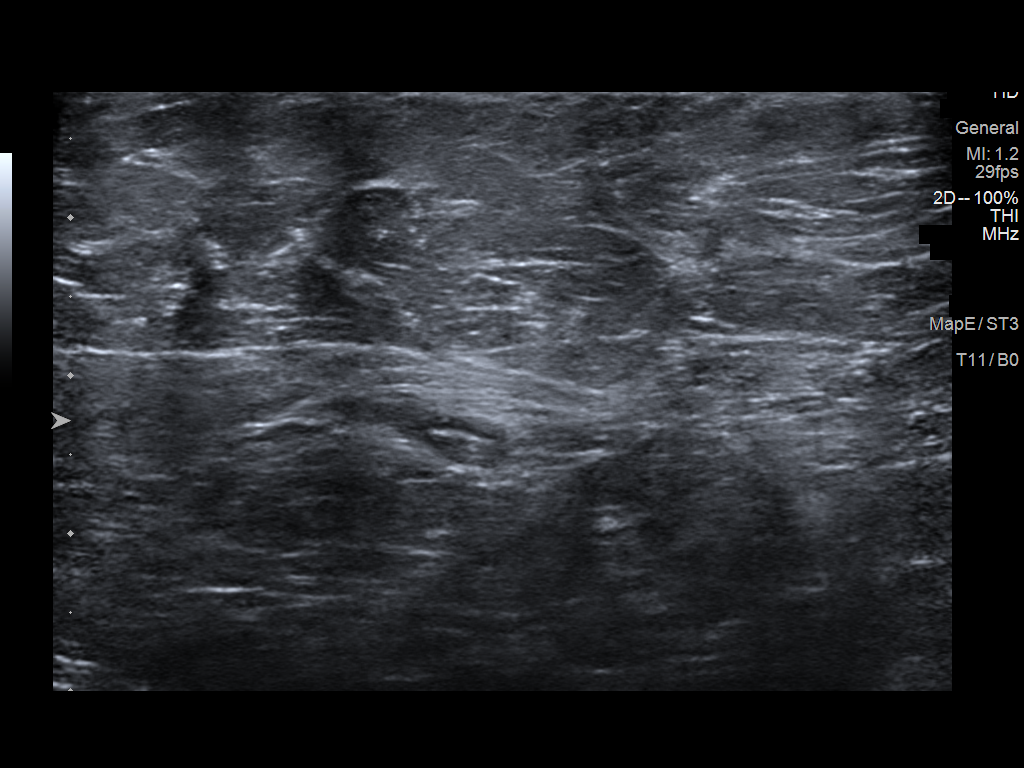
[im 2/2]
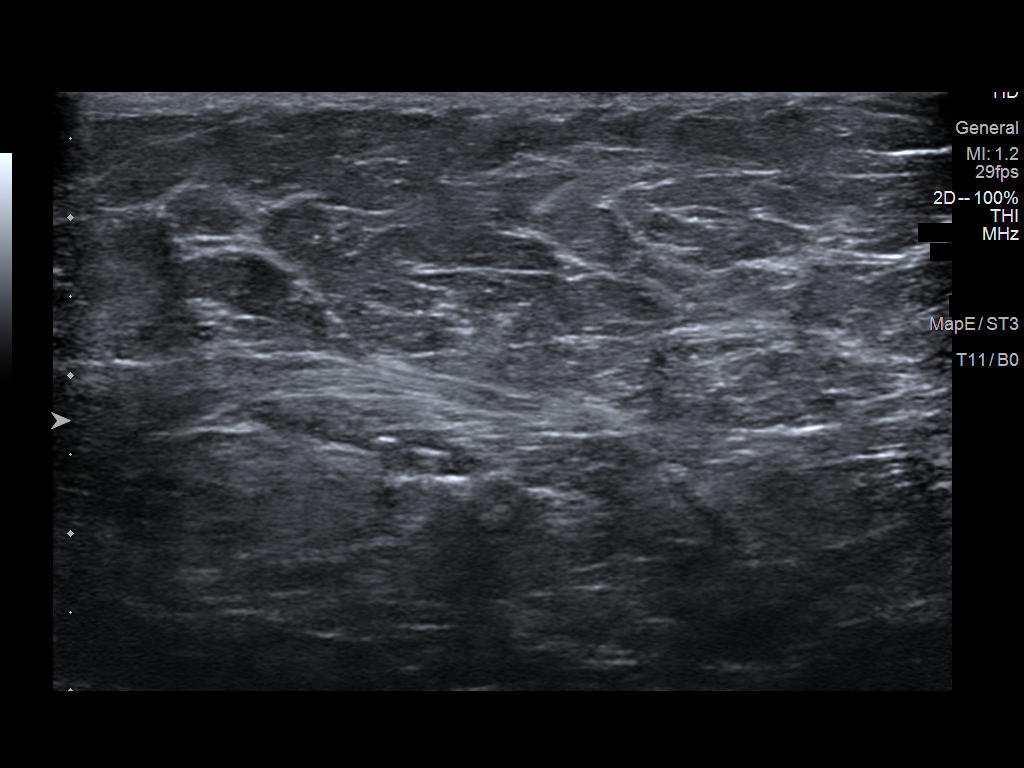

[2 of 2 positions shown; findings below may reference images not displayed]

FINDINGS: Patient presents for radioactive seed localization prior to surgical
excision. I met with the patient and we discussed the procedure of
seed localization including benefits and alternatives. We discussed
the high likelihood of a successful procedure. We discussed the
risks of the procedure including infection, bleeding, tissue injury
and further surgery. We discussed the low dose of radioactivity
involved in the procedure. Informed, written consent was given.

Seed #1: Ribbon Clip, right breast.  [facility].

The usual time-out protocol was performed immediately prior to the
procedure.

Using mammographic guidance, sterile technique, 1% lidocaine and an
[BD] radioactive seed, the ribbon shaped biopsy clip was localized
using a superior approach. The follow-up mammogram images confirm
the seed in the expected location and were marked for Dr. FOUD.

Follow-up survey of the patient confirms presence of the radioactive
seed.

Order number of [BD] seed:  [PHONE_NUMBER].

Total activity:  0.247 millicuries reference Date: [DATE].

Seed #2: Dumbell Clip, left breast, [facility].

The usual time-out protocol was performed immediately prior to the
procedure.

Using mammographic guidance, sterile technique, 1% lidocaine and an
[BD] radioactive seed, the dumbbell shaped biopsy marking clip was
localized using a superior approach. The follow-up mammogram images
confirm the seed in the expected location and were marked for Dr.
FOUD.

Follow-up survey of the patient confirms presence of the radioactive
seed.

Order number of [BD] seed:  [PHONE_NUMBER].

Total activity:  0.247 millicuries reference Date: [DATE].

Seed #3: Tribell Clip, right axillary metastatic lymph node.

The usual time-out protocol was performed immediately prior to the
procedure.

Using ultrasound guidance, sterile technique, 1% lidocaine and an
[BD] radioactive seed, the right axillary lymph node containing the
Tribell biopsy clip was localized using an inferior approach.

Follow-up survey of the patient confirms presence of the radioactive
seed.

Order number of [BD] seed:  [PHONE_NUMBER].

Total activity:  0.247 millicuries reference Date: [DATE]

The patient tolerated the procedure well and was released from the
[REDACTED]. She was given instructions regarding seed removal.
IMPRESSION: Radioactive seed localization right breast.

Radioactive seed localization left breast.

Radioactive seed localization right axilla.

No apparent complications.

## 2021-08-16 IMAGING — MG MM PLC BREAST LOC DEV 1ST LESION INC*R*
4 series · 4 of 4 positions shown · non-contrast
Comparison: Previous exam(s).

CLINICAL DATA: Patient presents for radioactive seed localization
right breast malignancy, a metastatic right axillary lymph node, and
an area atypical lobular hyperplasia in the left breast.

EXAM:
MAMMOGRAPHIC GUIDED RADIOACTIVE SEED LOCALIZATION OF THE RIGHT
BREAST
MAMMOGRAPHIC GUIDED RADIOACTIVE SEED LOCALIZATION OF THE LEFT BREAST
ULTRASOUND GUIDED RADIOACTIVE SEED LOCALIZATION OF THE RIGHT AXILLA

[R CC (1 of 3)]
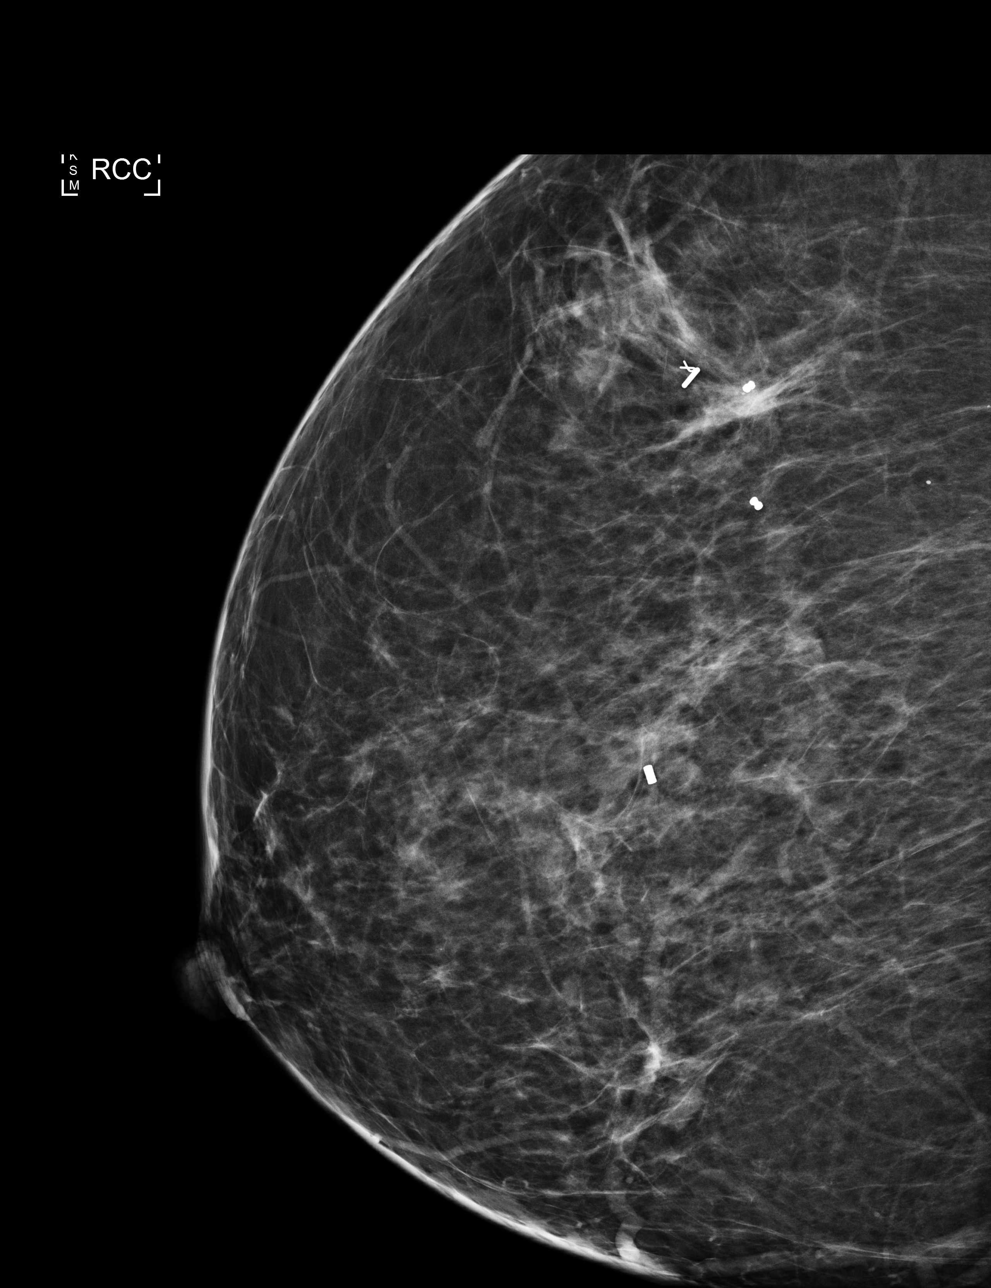

[R CC (2 of 3)]
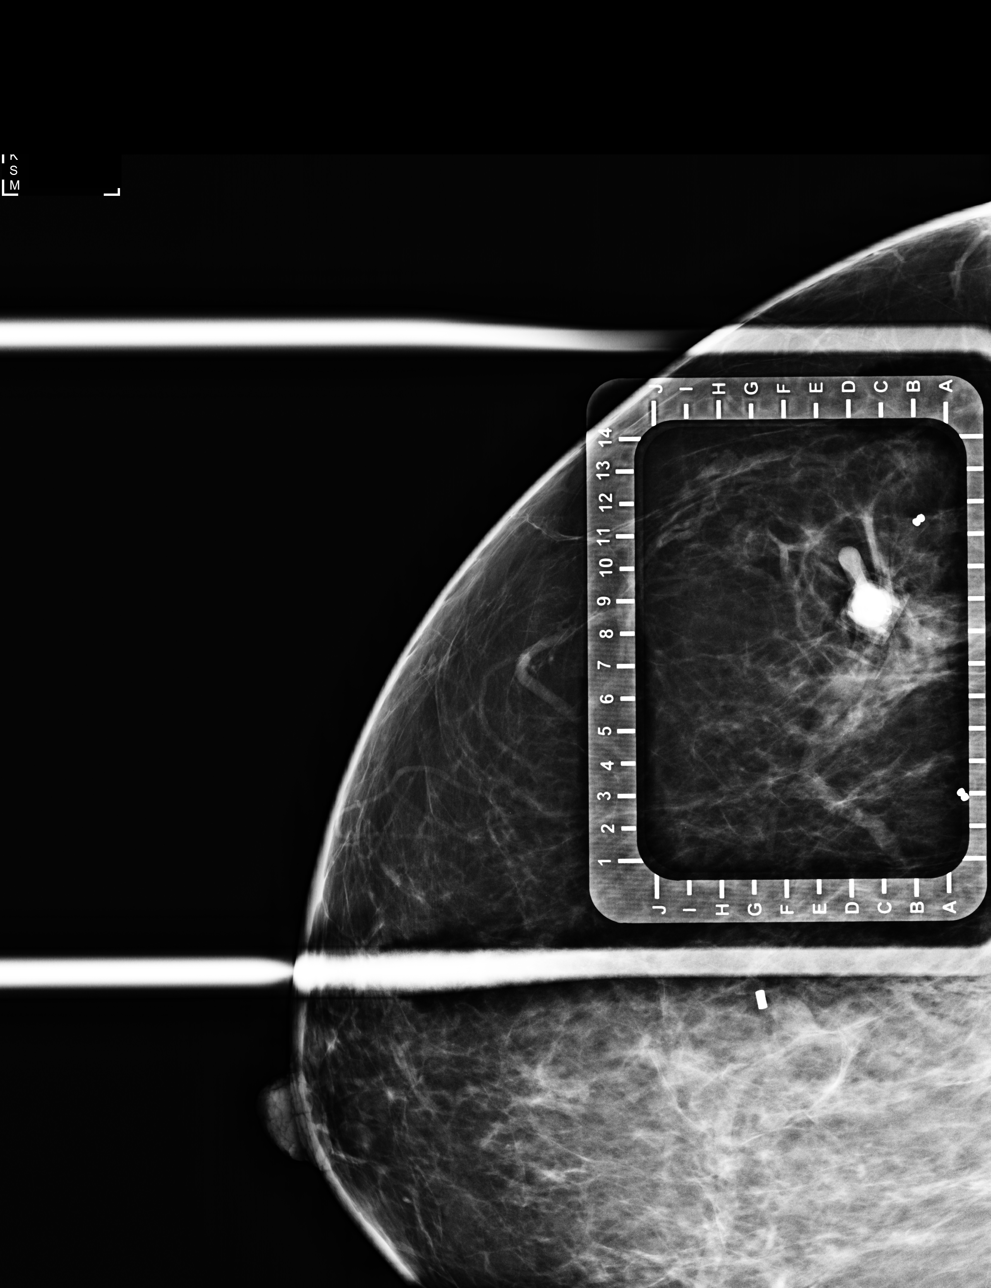

[R LM]
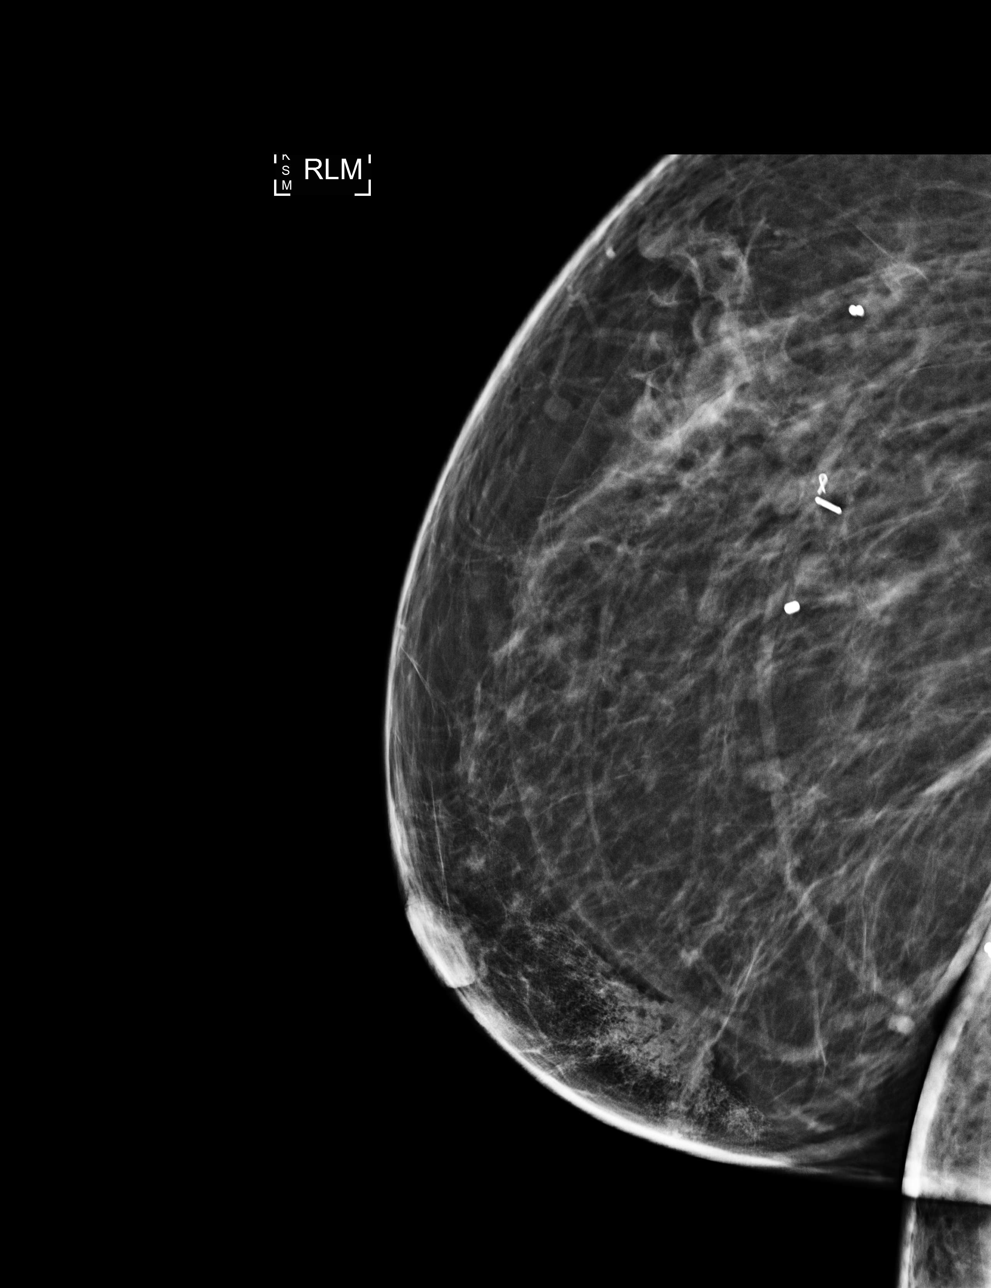

[R CC (3 of 3)]
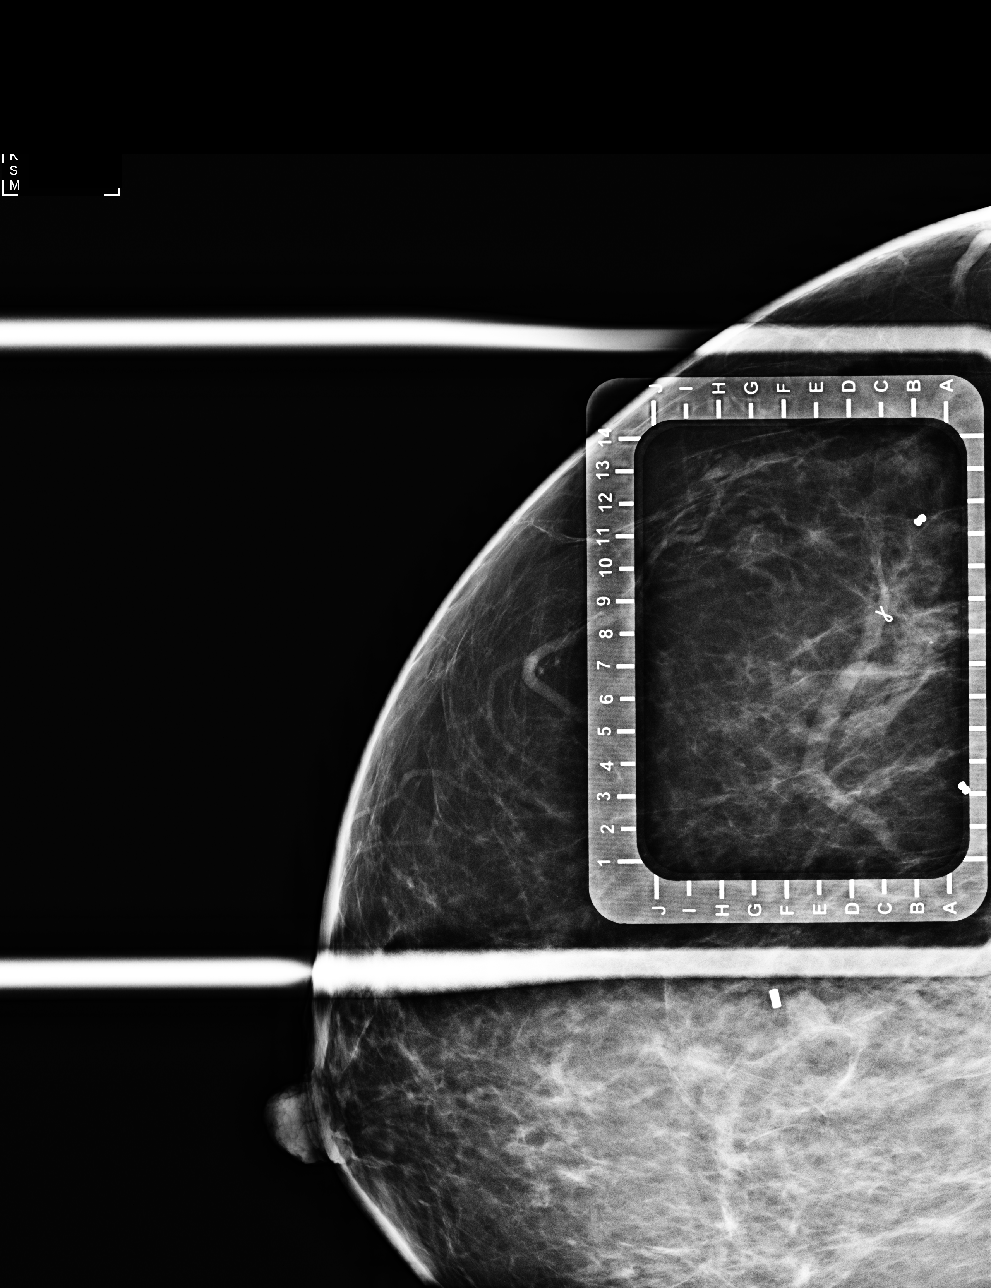

[4 of 4 positions shown; findings below may reference images not displayed]

FINDINGS: Patient presents for radioactive seed localization prior to surgical
excision. I met with the patient and we discussed the procedure of
seed localization including benefits and alternatives. We discussed
the high likelihood of a successful procedure. We discussed the
risks of the procedure including infection, bleeding, tissue injury
and further surgery. We discussed the low dose of radioactivity
involved in the procedure. Informed, written consent was given.

Seed #1: Ribbon Clip, right breast.  [facility].

The usual time-out protocol was performed immediately prior to the
procedure.

Using mammographic guidance, sterile technique, 1% lidocaine and an
[BD] radioactive seed, the ribbon shaped biopsy clip was localized
using a superior approach. The follow-up mammogram images confirm
the seed in the expected location and were marked for Dr. FOUD.

Follow-up survey of the patient confirms presence of the radioactive
seed.

Order number of [BD] seed:  [PHONE_NUMBER].

Total activity:  0.247 millicuries reference Date: [DATE].

Seed #2: Dumbell Clip, left breast, [facility].

The usual time-out protocol was performed immediately prior to the
procedure.

Using mammographic guidance, sterile technique, 1% lidocaine and an
[BD] radioactive seed, the dumbbell shaped biopsy marking clip was
localized using a superior approach. The follow-up mammogram images
confirm the seed in the expected location and were marked for Dr.
FOUD.

Follow-up survey of the patient confirms presence of the radioactive
seed.

Order number of [BD] seed:  [PHONE_NUMBER].

Total activity:  0.247 millicuries reference Date: [DATE].

Seed #3: Tribell Clip, right axillary metastatic lymph node.

The usual time-out protocol was performed immediately prior to the
procedure.

Using ultrasound guidance, sterile technique, 1% lidocaine and an
[BD] radioactive seed, the right axillary lymph node containing the
Tribell biopsy clip was localized using an inferior approach.

Follow-up survey of the patient confirms presence of the radioactive
seed.

Order number of [BD] seed:  [PHONE_NUMBER].

Total activity:  0.247 millicuries reference Date: [DATE]

The patient tolerated the procedure well and was released from the
[REDACTED]. She was given instructions regarding seed removal.
IMPRESSION: Radioactive seed localization right breast.

Radioactive seed localization left breast.

Radioactive seed localization right axilla.

No apparent complications.

## 2021-08-17 ENCOUNTER — Encounter (HOSPITAL_COMMUNITY)
Admission: RE | Disposition: A | Discharge status: Home / Self Care | Payer: Self-pay | Source: Ambulatory Visit | Attending: General Surgery

## 2021-08-17 ENCOUNTER — Ambulatory Visit (HOSPITAL_COMMUNITY): Payer: BC Managed Care – PPO | Admitting: Vascular Surgery

## 2021-08-17 ENCOUNTER — Ambulatory Visit (HOSPITAL_COMMUNITY): Payer: BC Managed Care – PPO | Admitting: Anesthesiology

## 2021-08-17 ENCOUNTER — Encounter (HOSPITAL_COMMUNITY)
Admission: RE | Admit: 2021-08-17 | Discharge: 2021-08-17 | Disposition: A | Discharge status: Home / Self Care | Payer: BC Managed Care – PPO | Source: Ambulatory Visit | Attending: General Surgery | Admitting: General Surgery

## 2021-08-17 ENCOUNTER — Ambulatory Visit
Admission: RE | Admit: 2021-08-17 | Discharge: 2021-08-17 | Disposition: A | Discharge status: Home / Self Care | Payer: BC Managed Care – PPO | Source: Ambulatory Visit | Attending: General Surgery | Admitting: General Surgery

## 2021-08-17 ENCOUNTER — Ambulatory Visit (HOSPITAL_COMMUNITY)
Admission: RE | Admit: 2021-08-17 | Discharge: 2021-08-17 | Disposition: A | Discharge status: Home / Self Care | Payer: BC Managed Care – PPO | Source: Ambulatory Visit | Attending: General Surgery | Admitting: General Surgery

## 2021-08-17 ENCOUNTER — Other Ambulatory Visit: Payer: Self-pay

## 2021-08-17 ENCOUNTER — Encounter (HOSPITAL_COMMUNITY): Payer: Self-pay | Admitting: General Surgery

## 2021-08-17 DIAGNOSIS — E1142 Type 2 diabetes mellitus with diabetic polyneuropathy: Secondary | ICD-10-CM | POA: Insufficient documentation

## 2021-08-17 DIAGNOSIS — C773 Secondary and unspecified malignant neoplasm of axilla and upper limb lymph nodes: Secondary | ICD-10-CM | POA: Diagnosis not present

## 2021-08-17 DIAGNOSIS — N6012 Diffuse cystic mastopathy of left breast: Secondary | ICD-10-CM | POA: Insufficient documentation

## 2021-08-17 DIAGNOSIS — C50911 Malignant neoplasm of unspecified site of right female breast: Secondary | ICD-10-CM

## 2021-08-17 DIAGNOSIS — Z6839 Body mass index (BMI) 39.0-39.9, adult: Secondary | ICD-10-CM | POA: Insufficient documentation

## 2021-08-17 DIAGNOSIS — Z803 Family history of malignant neoplasm of breast: Secondary | ICD-10-CM | POA: Diagnosis not present

## 2021-08-17 DIAGNOSIS — Z800 Family history of malignant neoplasm of digestive organs: Secondary | ICD-10-CM | POA: Insufficient documentation

## 2021-08-17 DIAGNOSIS — N6092 Unspecified benign mammary dysplasia of left breast: Secondary | ICD-10-CM

## 2021-08-17 DIAGNOSIS — Z170 Estrogen receptor positive status [ER+]: Secondary | ICD-10-CM | POA: Diagnosis not present

## 2021-08-17 DIAGNOSIS — Z87891 Personal history of nicotine dependence: Secondary | ICD-10-CM | POA: Insufficient documentation

## 2021-08-17 HISTORY — PX: PORT-A-CATH REMOVAL: SHX5289

## 2021-08-17 HISTORY — PX: RADIOACTIVE SEED GUIDED AXILLARY SENTINEL LYMPH NODE: SHX6735

## 2021-08-17 HISTORY — PX: BREAST LUMPECTOMY WITH RADIOACTIVE SEED AND SENTINEL LYMPH NODE BIOPSY: SHX6550

## 2021-08-17 HISTORY — PX: RADIOACTIVE SEED GUIDED EXCISIONAL BREAST BIOPSY: SHX6490

## 2021-08-17 LAB — GLUCOSE, CAPILLARY
Glucose-Capillary: 137 mg/dL — ABNORMAL HIGH (ref 70–99)
Glucose-Capillary: 124 mg/dL — ABNORMAL HIGH (ref 70–99)
Glucose-Capillary: 112 mg/dL — ABNORMAL HIGH (ref 70–99)

## 2021-08-17 IMAGING — MG MM BREAST SURGICAL SPECIMEN
1 series · 1 of 1 positions shown · non-contrast
Comparison: Previous exam(s).

CLINICAL DATA: Biopsy-proven metastatic RIGHT axillary lymph node
(in addition to RIGHT breast malignancy and LEFT breast ALH).
Radioactive seed localization was performed yesterday in
anticipation of today's targeted axillary node excision.

EXAM:
SPECIMEN RADIOGRAPH OF THE RIGHT AXILLA

[R]
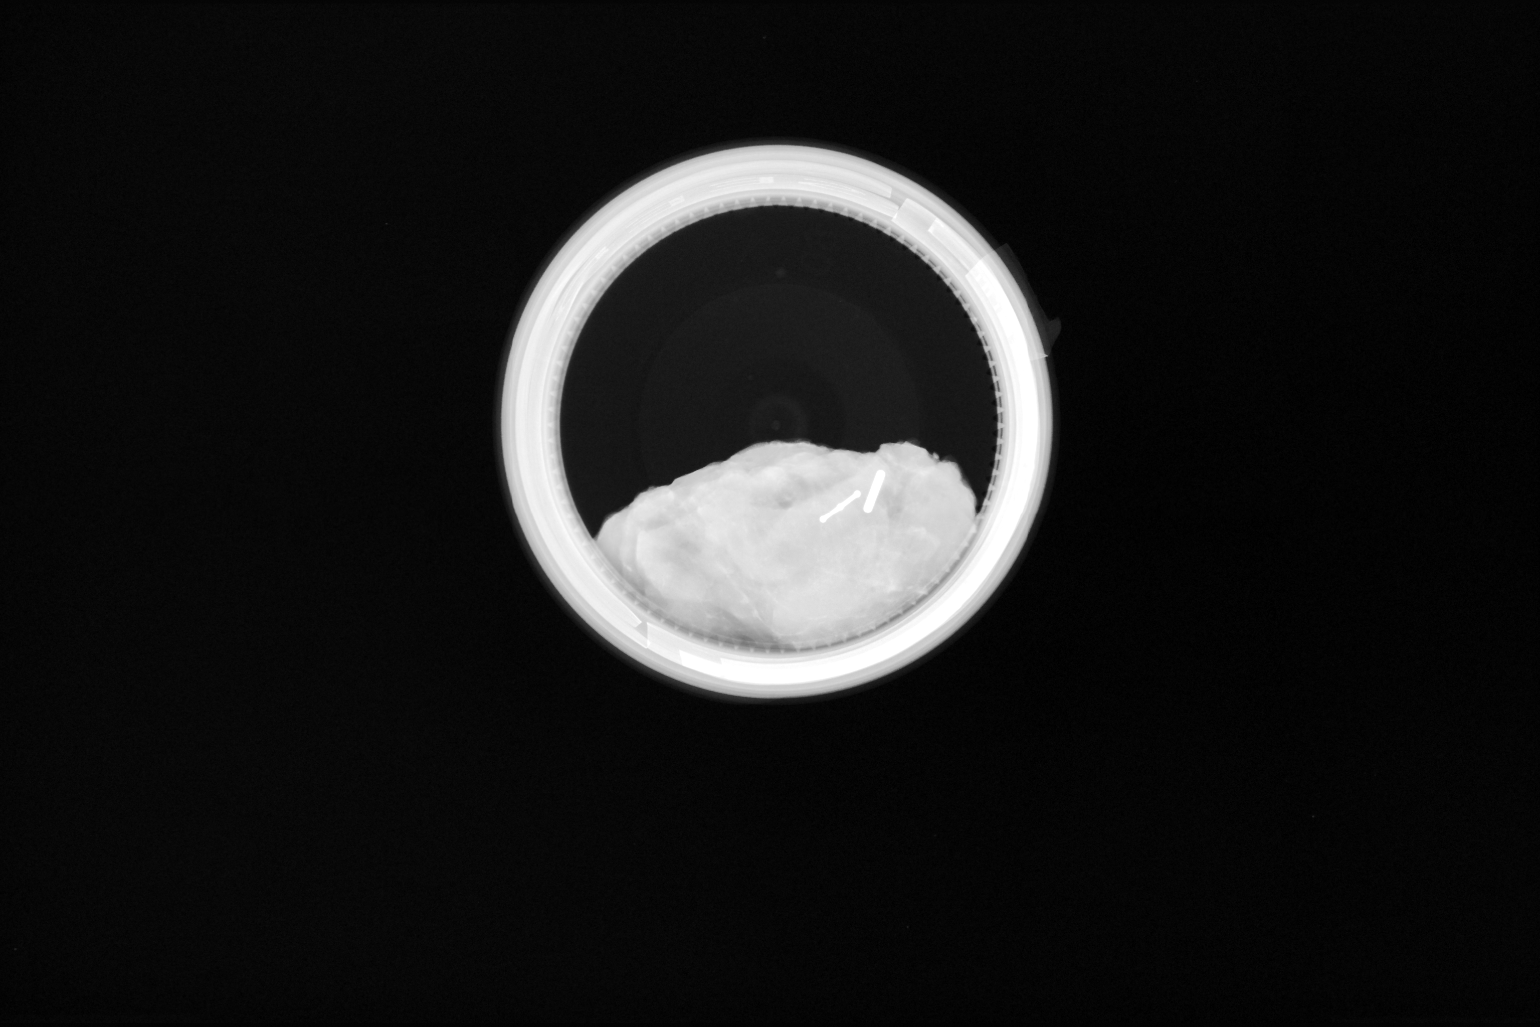

[1 of 1 positions shown; findings below may reference images not displayed]

FINDINGS: Status post excision of the RIGHT axilla. The radioactive seed and
the Tribell biopsy marker clip are present in the non-compressed
specimen. The radioactive seed is intact. This was discussed
directly with the operating room nurse at the time of interpretation
on [DATE] at [DATE] p.m.
IMPRESSION: Specimen radiograph of the RIGHT axilla.

## 2021-08-17 IMAGING — MG MM BREAST SURGICAL SPECIMEN
1 series · 1 of 1 positions shown · non-contrast
Comparison: Previous exam(s).

CLINICAL DATA: Evaluate specimen

EXAM:
SPECIMEN RADIOGRAPH OF THE RIGHT BREAST

[R]
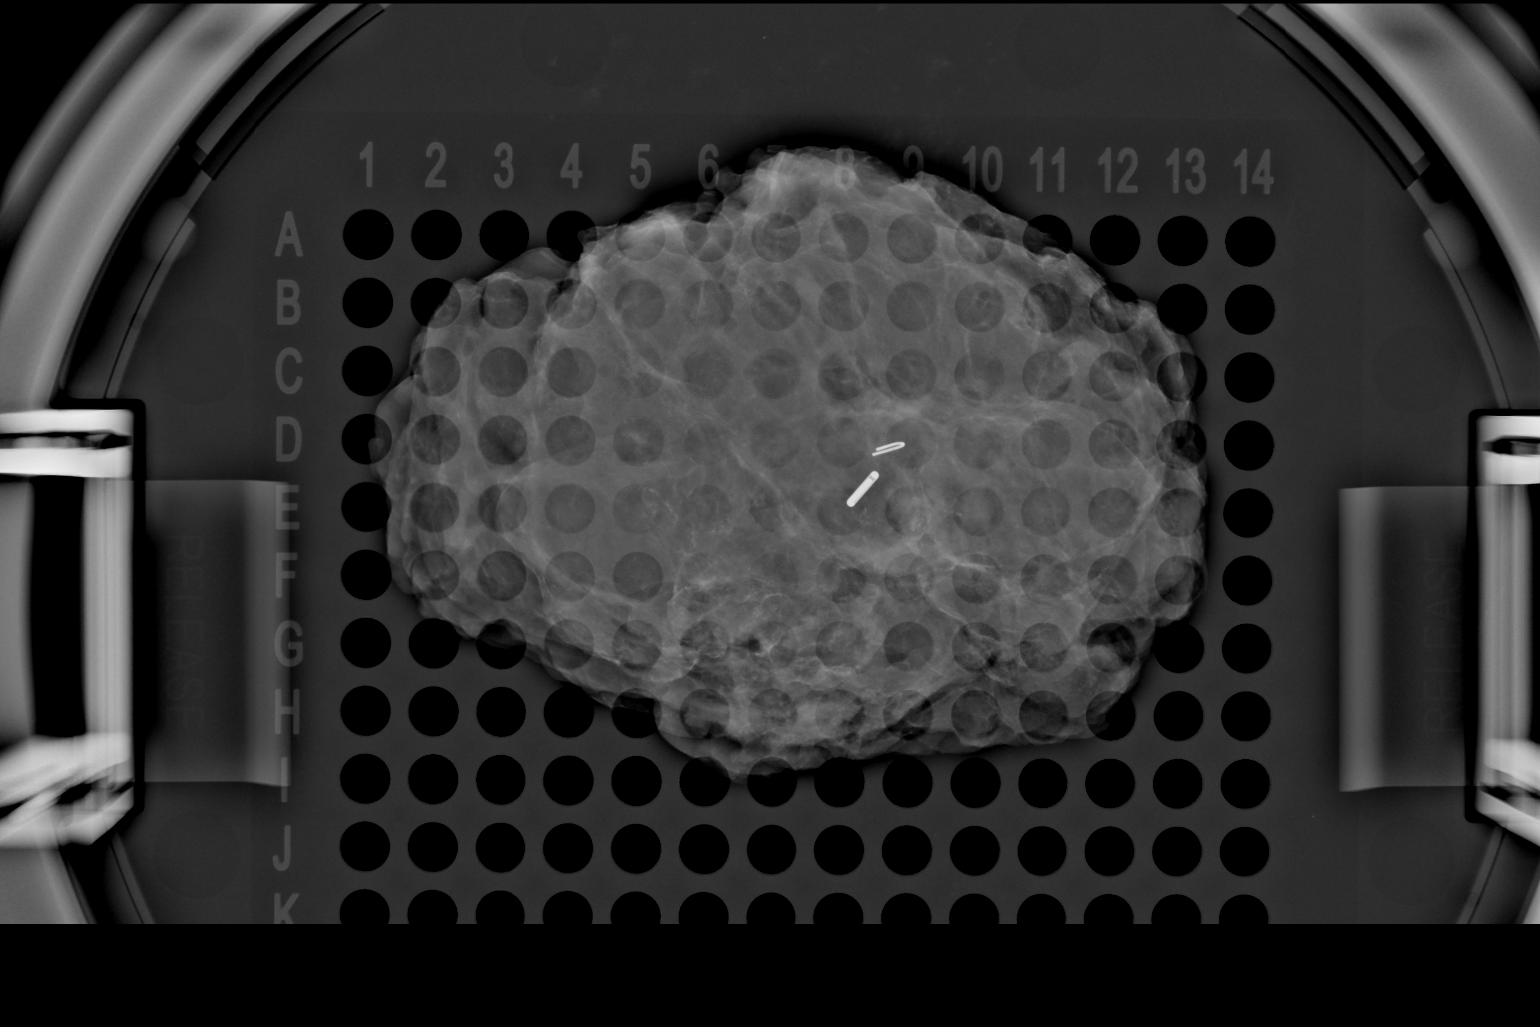

[1 of 1 positions shown; findings below may reference images not displayed]

FINDINGS: Status post excision of the right breast. The radioactive seed and
biopsy marker clip are present, completely intact, and were marked
for pathology.
IMPRESSION: Specimen radiograph of the right breast.

## 2021-08-17 IMAGING — MG MM BREAST SURGICAL SPECIMEN
1 series · 1 of 1 positions shown · non-contrast
Comparison: Previous exam(s).

CLINICAL DATA: Evaluate specimen

EXAM:
SPECIMEN RADIOGRAPH OF THE LEFT BREAST

[L]
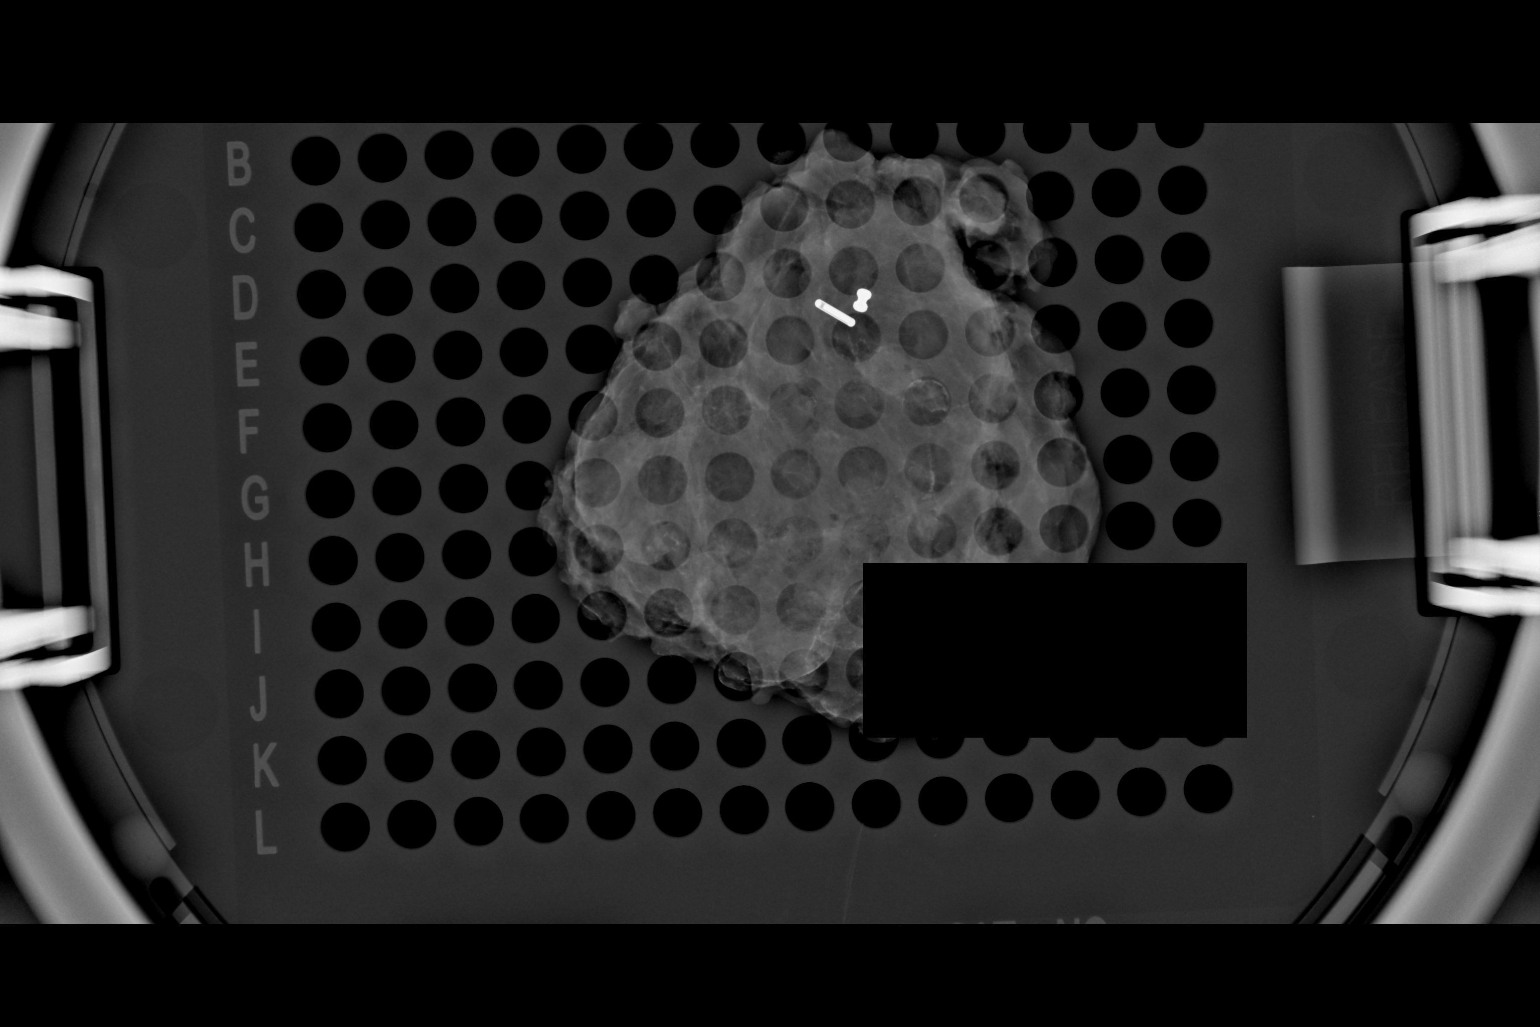

[1 of 1 positions shown; findings below may reference images not displayed]

FINDINGS: Status post excision of the left breast. The radioactive seed and
biopsy marker clip are present, completely intact, and were marked
for pathology.
IMPRESSION: Specimen radiograph of the left breast.

## 2021-08-17 SURGERY — BREAST LUMPECTOMY WITH RADIOACTIVE SEED AND SENTINEL LYMPH NODE BIOPSY
Anesthesia: General | Site: Breast | Laterality: Right

## 2021-08-17 MED ORDER — MIDAZOLAM HCL 2 MG/2ML IJ SOLN
2.0000 mg | Freq: Once | INTRAMUSCULAR | Status: DC
  Filled 2021-08-17: qty 2

## 2021-08-17 MED ORDER — FENTANYL CITRATE (PF) 100 MCG/2ML IJ SOLN
INTRAMUSCULAR | Status: AC
  Administered 2021-08-17: 50 ug via INTRAVENOUS
  Filled 2021-08-17: qty 2

## 2021-08-17 MED ORDER — LIDOCAINE 2% (20 MG/ML) 5 ML SYRINGE
INTRAMUSCULAR | Status: AC
  Filled 2021-08-17: qty 5

## 2021-08-17 MED ORDER — ACETAMINOPHEN 500 MG PO TABS
ORAL_TABLET | ORAL | Status: AC
  Administered 2021-08-17: 1000 mg via ORAL
  Filled 2021-08-17: qty 2

## 2021-08-17 MED ORDER — AMISULPRIDE (ANTIEMETIC) 5 MG/2ML IV SOLN
INTRAVENOUS | Status: AC
  Filled 2021-08-17: qty 4

## 2021-08-17 MED ORDER — FENTANYL CITRATE (PF) 250 MCG/5ML IJ SOLN
INTRAMUSCULAR | Status: DC | PRN
  Administered 2021-08-17: 50 ug via INTRAVENOUS
  Administered 2021-08-17: 100 ug via INTRAVENOUS

## 2021-08-17 MED ORDER — FENTANYL CITRATE (PF) 250 MCG/5ML IJ SOLN
INTRAMUSCULAR | Status: AC
  Filled 2021-08-17: qty 5

## 2021-08-17 MED ORDER — DEXAMETHASONE SODIUM PHOSPHATE 10 MG/ML IJ SOLN
INTRAMUSCULAR | Status: AC
  Filled 2021-08-17: qty 1

## 2021-08-17 MED ORDER — CEFAZOLIN SODIUM-DEXTROSE 2-4 GM/100ML-% IV SOLN
INTRAVENOUS | Status: AC
  Filled 2021-08-17: qty 100

## 2021-08-17 MED ORDER — MIDAZOLAM HCL 2 MG/2ML IJ SOLN
INTRAMUSCULAR | Status: DC | PRN
  Administered 2021-08-17 (×2): 1 mg via INTRAVENOUS

## 2021-08-17 MED ORDER — PROPOFOL 10 MG/ML IV BOLUS
INTRAVENOUS | Status: DC | PRN
  Administered 2021-08-17: 50 mg via INTRAVENOUS
  Administered 2021-08-17: 150 mg via INTRAVENOUS
  Administered 2021-08-17: 20 mg via INTRAVENOUS
  Administered 2021-08-17: 30 mg via INTRAVENOUS

## 2021-08-17 MED ORDER — LACTATED RINGERS IV SOLN: INTRAVENOUS | Status: DC

## 2021-08-17 MED ORDER — FENTANYL CITRATE (PF) 100 MCG/2ML IJ SOLN: 50.0000 ug | Freq: Once | INTRAMUSCULAR | Status: AC

## 2021-08-17 MED ORDER — ACETAMINOPHEN 500 MG PO TABS: 1000.0000 mg | ORAL_TABLET | ORAL | Status: AC

## 2021-08-17 MED ORDER — LIDOCAINE HCL 1 % IJ SOLN
INTRAMUSCULAR | Status: DC | PRN
  Administered 2021-08-17: 20 mL

## 2021-08-17 MED ORDER — ENSURE PRE-SURGERY PO LIQD: 296.0000 mL | Freq: Once | ORAL | Status: DC

## 2021-08-17 MED ORDER — ORAL CARE MOUTH RINSE: 15.0000 mL | Freq: Once | OROMUCOSAL | Status: AC

## 2021-08-17 MED ORDER — CHLORHEXIDINE GLUCONATE 0.12 % MT SOLN
15.0000 mL | Freq: Once | OROMUCOSAL | Status: AC
  Administered 2021-08-17: 15 mL via OROMUCOSAL
  Filled 2021-08-17: qty 15

## 2021-08-17 MED ORDER — PROPOFOL 10 MG/ML IV BOLUS
INTRAVENOUS | Status: AC
  Filled 2021-08-17: qty 20

## 2021-08-17 MED ORDER — OXYCODONE HCL 5 MG PO TABS: 5.0000 mg | ORAL_TABLET | Freq: Four times a day (QID) | ORAL | 0 refills | Status: DC | PRN

## 2021-08-17 MED ORDER — BUPIVACAINE LIPOSOME 1.3 % IJ SUSP
INTRAMUSCULAR | Status: DC | PRN
  Administered 2021-08-17: 10 mL

## 2021-08-17 MED ORDER — PHENYLEPHRINE HCL-NACL 20-0.9 MG/250ML-% IV SOLN
INTRAVENOUS | Status: DC | PRN
  Administered 2021-08-17: 30 ug/min via INTRAVENOUS

## 2021-08-17 MED ORDER — LIDOCAINE HCL 1 % IJ SOLN
INTRAMUSCULAR | Status: AC
  Filled 2021-08-17: qty 20

## 2021-08-17 MED ORDER — GABAPENTIN 300 MG PO CAPS
ORAL_CAPSULE | ORAL | Status: AC
  Administered 2021-08-17: 300 mg via ORAL
  Filled 2021-08-17: qty 1

## 2021-08-17 MED ORDER — MAGTRACE LYMPHATIC TRACER
INTRAMUSCULAR | Status: DC | PRN
  Administered 2021-08-17: 2 mL via INTRAMUSCULAR

## 2021-08-17 MED ORDER — MIDAZOLAM HCL 2 MG/2ML IJ SOLN
INTRAMUSCULAR | Status: AC
  Filled 2021-08-17: qty 2

## 2021-08-17 MED ORDER — LIDOCAINE 2% (20 MG/ML) 5 ML SYRINGE
INTRAMUSCULAR | Status: DC | PRN
  Administered 2021-08-17: 80 mg via INTRAVENOUS

## 2021-08-17 MED ORDER — ONDANSETRON HCL 4 MG/2ML IJ SOLN
INTRAMUSCULAR | Status: DC | PRN
  Administered 2021-08-17: 4 mg via INTRAVENOUS

## 2021-08-17 MED ORDER — ROCURONIUM BROMIDE 10 MG/ML (PF) SYRINGE
PREFILLED_SYRINGE | INTRAVENOUS | Status: DC | PRN
Start: 2021-08-17 — End: 2021-08-17
  Administered 2021-08-17: 70 mg via INTRAVENOUS

## 2021-08-17 MED ORDER — DEXAMETHASONE SODIUM PHOSPHATE 10 MG/ML IJ SOLN
INTRAMUSCULAR | Status: DC | PRN
  Administered 2021-08-17: 5 mg via INTRAVENOUS

## 2021-08-17 MED ORDER — MIDAZOLAM HCL 2 MG/2ML IJ SOLN
INTRAMUSCULAR | Status: AC
  Administered 2021-08-17: 2 mg
  Filled 2021-08-17: qty 2

## 2021-08-17 MED ORDER — SUGAMMADEX SODIUM 200 MG/2ML IV SOLN
INTRAVENOUS | Status: DC | PRN
  Administered 2021-08-17: 200 mg via INTRAVENOUS

## 2021-08-17 MED ORDER — CHLORHEXIDINE GLUCONATE CLOTH 2 % EX PADS: 6.0000 | MEDICATED_PAD | Freq: Once | CUTANEOUS | Status: DC

## 2021-08-17 MED ORDER — AMISULPRIDE (ANTIEMETIC) 5 MG/2ML IV SOLN
10.0000 mg | Freq: Once | INTRAVENOUS | Status: AC | PRN
  Administered 2021-08-17: 10 mg via INTRAVENOUS

## 2021-08-17 MED ORDER — CEFAZOLIN SODIUM-DEXTROSE 2-4 GM/100ML-% IV SOLN
2.0000 g | INTRAVENOUS | Status: AC
  Administered 2021-08-17: 2 g via INTRAVENOUS

## 2021-08-17 MED ORDER — PROMETHAZINE HCL 25 MG/ML IJ SOLN: 6.2500 mg | INTRAMUSCULAR | Status: DC | PRN

## 2021-08-17 MED ORDER — BUPIVACAINE-EPINEPHRINE (PF) 0.25% -1:200000 IJ SOLN
INTRAMUSCULAR | Status: AC
  Filled 2021-08-17: qty 30

## 2021-08-17 MED ORDER — DEXMEDETOMIDINE (PRECEDEX) IN NS 20 MCG/5ML (4 MCG/ML) IV SYRINGE
PREFILLED_SYRINGE | INTRAVENOUS | Status: DC | PRN
  Administered 2021-08-17: 4 ug via INTRAVENOUS
  Administered 2021-08-17: 8 ug via INTRAVENOUS

## 2021-08-17 MED ORDER — BUPIVACAINE HCL (PF) 0.5 % IJ SOLN
INTRAMUSCULAR | Status: DC | PRN
  Administered 2021-08-17: 20 mL

## 2021-08-17 MED ORDER — SCOPOLAMINE 1 MG/3DAYS TD PT72: 1.0000 | MEDICATED_PATCH | TRANSDERMAL | Status: DC

## 2021-08-17 MED ORDER — 0.9 % SODIUM CHLORIDE (POUR BTL) OPTIME
TOPICAL | Status: DC | PRN
  Administered 2021-08-17: 1000 mL

## 2021-08-17 MED ORDER — GABAPENTIN 300 MG PO CAPS: 300.0000 mg | ORAL_CAPSULE | ORAL | Status: AC

## 2021-08-17 MED ORDER — ROCURONIUM BROMIDE 10 MG/ML (PF) SYRINGE
PREFILLED_SYRINGE | INTRAVENOUS | Status: AC
  Filled 2021-08-17: qty 10

## 2021-08-17 MED ORDER — SCOPOLAMINE 1 MG/3DAYS TD PT72
MEDICATED_PATCH | TRANSDERMAL | Status: AC
  Administered 2021-08-17: 1.5 mg via TRANSDERMAL
  Filled 2021-08-17: qty 1

## 2021-08-17 MED ORDER — TECHNETIUM TC 99M TILMANOCEPT KIT
1.0000 | PACK | Freq: Once | INTRAVENOUS | Status: AC | PRN
  Administered 2021-08-17: 1 via INTRADERMAL

## 2021-08-17 MED ORDER — FENTANYL CITRATE (PF) 100 MCG/2ML IJ SOLN: 25.0000 ug | INTRAMUSCULAR | Status: DC | PRN

## 2021-08-17 SURGICAL SUPPLY — 54 items
BAG COUNTER SPONGE SURGICOUNT (BAG) ×4 IMPLANT
BINDER BREAST LRG (GAUZE/BANDAGES/DRESSINGS) IMPLANT
BINDER BREAST XLRG (GAUZE/BANDAGES/DRESSINGS) IMPLANT
BNDG COHESIVE 4X5 TAN STRL (GAUZE/BANDAGES/DRESSINGS) ×4 IMPLANT
CANISTER SUCT 3000ML PPV (MISCELLANEOUS) ×4 IMPLANT
CHLORAPREP W/TINT 10.5 ML (MISCELLANEOUS) ×4 IMPLANT
CHLORAPREP W/TINT 26 (MISCELLANEOUS) ×4 IMPLANT
CLIP VESOCCLUDE LG 6/CT (CLIP) ×4 IMPLANT
CLIP VESOCCLUDE MED 24/CT (CLIP) ×4 IMPLANT
CNTNR URN SCR LID CUP LEK RST (MISCELLANEOUS) IMPLANT
CONT SPEC 4OZ STRL OR WHT (MISCELLANEOUS)
COVER PROBE W GEL 5X96 (DRAPES) ×4 IMPLANT
COVER SURGICAL LIGHT HANDLE (MISCELLANEOUS) ×4 IMPLANT
DECANTER SPIKE VIAL GLASS SM (MISCELLANEOUS) ×8 IMPLANT
DERMABOND ADVANCED (GAUZE/BANDAGES/DRESSINGS) ×1
DERMABOND ADVANCED .7 DNX12 (GAUZE/BANDAGES/DRESSINGS) ×3 IMPLANT
DEVICE DUBIN SPECIMEN MAMMOGRA (MISCELLANEOUS) IMPLANT
DRAPE CHEST BREAST 15X10 FENES (DRAPES) ×4 IMPLANT
DRAPE LAPAROTOMY 100X72 PEDS (DRAPES) ×4 IMPLANT
DRAPE SURG 17X23 STRL (DRAPES) IMPLANT
ELECT CAUTERY BLADE 6.4 (BLADE) ×4 IMPLANT
ELECT COATED BLADE 2.86 ST (ELECTRODE) ×4 IMPLANT
ELECT REM PT RETURN 9FT ADLT (ELECTROSURGICAL) ×4
ELECTRODE REM PT RTRN 9FT ADLT (ELECTROSURGICAL) ×3 IMPLANT
GAUZE 4X4 16PLY ~~LOC~~+RFID DBL (SPONGE) ×4 IMPLANT
GLOVE SURG ENC MOIS LTX SZ6 (GLOVE) ×4 IMPLANT
GLOVE SURG UNDER LTX SZ6.5 (GLOVE) ×4 IMPLANT
GOWN STRL REUS W/ TWL LRG LVL3 (GOWN DISPOSABLE) ×3 IMPLANT
GOWN STRL REUS W/TWL 2XL LVL3 (GOWN DISPOSABLE) ×4 IMPLANT
GOWN STRL REUS W/TWL LRG LVL3 (GOWN DISPOSABLE) ×4
KIT BASIN OR (CUSTOM PROCEDURE TRAY) ×4 IMPLANT
KIT MARKER MARGIN INK (KITS) ×4 IMPLANT
KIT TURNOVER KIT B (KITS) ×4 IMPLANT
LIGHT WAVEGUIDE WIDE FLAT (MISCELLANEOUS) IMPLANT
NDL 18GX1X1/2 (RX/OR ONLY) (NEEDLE) IMPLANT
NDL FILTER BLUNT 18X1 1/2 (NEEDLE) IMPLANT
NDL HYPO 25GX1X1/2 BEV (NEEDLE) ×3 IMPLANT
NEEDLE 18GX1X1/2 (RX/OR ONLY) (NEEDLE) IMPLANT
NEEDLE FILTER BLUNT 18X 1/2SAF (NEEDLE)
NEEDLE FILTER BLUNT 18X1 1/2 (NEEDLE) IMPLANT
NEEDLE HYPO 25GX1X1/2 BEV (NEEDLE) ×4 IMPLANT
NS IRRIG 1000ML POUR BTL (IV SOLUTION) ×4 IMPLANT
PACK GENERAL/GYN (CUSTOM PROCEDURE TRAY) ×4 IMPLANT
PACK UNIVERSAL I (CUSTOM PROCEDURE TRAY) ×4 IMPLANT
PAD ARMBOARD 7.5X6 YLW CONV (MISCELLANEOUS) ×8 IMPLANT
STOCKINETTE IMPERVIOUS 9X36 MD (GAUZE/BANDAGES/DRESSINGS) ×4 IMPLANT
SUT MNCRL AB 4-0 PS2 18 (SUTURE) ×4 IMPLANT
SUT MON AB 4-0 PC3 18 (SUTURE) ×4 IMPLANT
SUT VIC AB 3-0 SH 27 (SUTURE) ×4
SUT VIC AB 3-0 SH 27X BRD (SUTURE) ×3 IMPLANT
SUT VIC AB 3-0 SH 8-18 (SUTURE) ×4 IMPLANT
SYR CONTROL 10ML LL (SYRINGE) ×4 IMPLANT
TOWEL GREEN STERILE (TOWEL DISPOSABLE) ×4 IMPLANT
TOWEL GREEN STERILE FF (TOWEL DISPOSABLE) ×4 IMPLANT

## 2021-08-17 NOTE — Transfer of Care (Signed)
Immediate Anesthesia Transfer of Care Note  Patient: Sandra Bishop  Procedure(s) Performed: RIGHT BREAST LUMPECTOMY WITH RADIOACTIVE SEED AND SENTINEL LYMPH NODE BIOPSY (Right: Breast) RADIOACTIVE SEED GUIDED RIGHT SENTINEL LYMPH NODE EXCISION (Right: Breast) RADIOACTIVE SEED GUIDED EXCISIONAL LEFT BREAST BIOPSY (Left: Breast) REMOVAL PORT-A-CATH  Patient Location: PACU  Anesthesia Type:General  Level of Consciousness: drowsy  Airway & Oxygen Therapy: Patient Spontanous Breathing and Patient connected to face mask oxygen  Post-op Assessment: Report given to RN and Post -op Vital signs reviewed and stable  Post vital signs: Reviewed and stable  Last Vitals:  Vitals Value Taken Time  BP 120/53 08/17/21 1536  Temp    Pulse 94 08/17/21 1537  Resp 15 08/17/21 1537  SpO2 99 % 08/17/21 1537  Vitals shown include unvalidated device data.  Last Pain:  Vitals:   08/17/21 0952  TempSrc:   PainSc: 2       Patients Stated Pain Goal: 1 (11/24/00 1117)  Complications: No notable events documented.

## 2021-08-17 NOTE — Anesthesia Preprocedure Evaluation (Addendum)
Anesthesia Evaluation  Patient identified by MRN, date of birth, ID band Patient awake    Reviewed: Allergy & Precautions, NPO status , Patient's Chart, lab work & pertinent test results  Airway Mallampati: III  TM Distance: >3 FB Neck ROM: Full    Dental no notable dental hx.    Pulmonary former smoker,    Pulmonary exam normal breath sounds clear to auscultation       Cardiovascular Exercise Tolerance: Good Normal cardiovascular exam Rhythm:Regular Rate:Normal     Neuro/Psych Seizures: peripheral neuropathy.   Neuromuscular disease negative psych ROS   GI/Hepatic   Endo/Other  diabetesMorbid obesity  Renal/GU      Musculoskeletal negative musculoskeletal ROS (+)   Abdominal   Peds  Hematology  (+) anemia ,   Anesthesia Other Findings Right breast cancer  Reproductive/Obstetrics negative OB ROS                            Anesthesia Physical Anesthesia Plan  ASA: 3  Anesthesia Plan: General   Post-op Pain Management: Tylenol PO (pre-op), Regional block and Gabapentin PO (pre-op)   Induction: Intravenous  PONV Risk Score and Plan: 3 and Midazolam, Scopolamine patch - Pre-op, Treatment may vary due to age or medical condition, Dexamethasone and Ondansetron  Airway Management Planned: Oral ETT  Additional Equipment: None  Intra-op Plan:   Post-operative Plan: Extubation in OR  Informed Consent: I have reviewed the patients History and Physical, chart, labs and discussed the procedure including the risks, benefits and alternatives for the proposed anesthesia with the patient or authorized representative who has indicated his/her understanding and acceptance.     Dental advisory given  Plan Discussed with: CRNA and Anesthesiologist  Anesthesia Plan Comments: (Right PEC block with Exparel. GETA. )       Anesthesia Quick Evaluation

## 2021-08-17 NOTE — Anesthesia Procedure Notes (Signed)
Anesthesia Regional Block: Pectoralis block   Pre-Anesthetic Checklist: , timeout performed,  Correct Patient, Correct Site, Correct Laterality,  Correct Procedure, Correct Position, site marked,  Risks and benefits discussed,  Surgical consent,  Pre-op evaluation,  At surgeon's request and post-op pain management  Laterality: Right  Prep: chloraprep       Needles:  Injection technique: Single-shot  Needle Type: Echogenic Stimulator Needle     Needle Length: 10cm  Needle Gauge: 20     Additional Needles:   Procedures:,,,, ultrasound used (permanent image in chart),,    Narrative:  Start time: 08/17/2021 10:35 AM End time: 08/17/2021 10:40 AM Injection made incrementally with aspirations every 5 mL.  Performed by: Personally  Anesthesiologist: Merlinda Frederick, MD  Additional Notes: Functioning IV was confirmed and monitors were applied.  Sterile prep and drape,hand hygiene and sterile gloves were used. Ultrasound guidance: relevant anatomy identified, needle position confirmed, local anesthetic spread visualized around nerve(s)., vascular puncture avoided. Negative aspiration and negative test dose prior to incremental administration of local anesthetic. The patient tolerated the procedure well.

## 2021-08-17 NOTE — Op Note (Signed)
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  She had an abnormal left breast MRI and had a discordant area of ALH to excise.    Pre-operative Diagnosis: right breast cancer, left subclavian port in place, and left breast atypical lobular hyperplasia.   Post-operative Diagnosis: Same  Surgeon: [name]   Anesthesia: General endotracheal anesthesia  ASA Class: 3  Procedure Details  The patient was seen in the Holding Room. The risks, benefits, complications, treatment options, and expected outcomes were discussed with the patient. The possibilities of bleeding, infection, the need for additional procedures, failure to diagnose a condition, and creating a complication requiring transfusion or operation were discussed with the patient. The patient concurred with the proposed plan, giving informed consent.  The site of surgery properly noted/marked. The patient was taken to Operating Room # 2, identified, and the procedure verified as right Breast Seed localized Lumpectomy, right axillary seed localized lymph node biopsy, right sentinel lymph node biopsy, port removal, and left seed localized excisional breast biopsy. A Time Out was held and the above information confirmed.  The right arm, bilateral breast, and chest were prepped and draped in standard fashion.   The left side was addressed first.  The left subclavian port incision was opened with a #15 blade.  The subcutaneous tissue was divided with the cautery.  The port was identified and the capsule opened.  The four 2-0 prolene sutures were removed.  The port was then removed and pressure held on  the tract.  The catheter appeared intact without evidence of breakage.  The wound was inspected for hemostasis, which was achieved with cautery.  The wound was closed with 3-0 vicryl deep dermal interrupted sutures and 4-0 Monocryl running subcuticular suture.  The wound was cleaned, dried, and dressed with dermabond.  The left excisional biopsy was performed next.  A circumlinear incision near the previously placed radioactive seed.  Dissection was carried down to around the point of maximum signal intensity. The cautery was used to perform the dissection.  Hemostasis was achieved with cautery. The specimen was inked with the margin marker paint kit.    Specimen radiography confirmed inclusion of the mammographic lesion, the clip, and the seed.  Hemostasis was achieved with cautery.  One clip was placed into the breast.  The background signal in the breast was zero.  The wound was irrigated and closed with 3-0 vicryl in layers and 4-0 monocryl subcuticular suture.    The right breast was addressed next.  This was done similarly, but 5 clips were placed along the margins of the breast cavity.    Using a hand-held gamma probe, right axillary sentinel nodes were identified transcutaneously.  An oblique incision was created below the axillary hairline.  Dissection was carried through the clavipectoral fascia. The seed containing node was removed first.  It had very strong signal with the I125 and the MagTrace.  Two additional deep level two axillary sentinel nodes were removed.  Counts per second were 3790 highest with magTrace and 2300 with Tc99.    The background count was 23 on technetium and 50 with MagTrace.    The wound was irrigated.  Hemostasis was achieved with cautery.  The axillary incision was closed  with a 3-0 vicryl deep dermal interrupted sutures and a 4-0 monocryl subcuticular closure.    Sterile dressings were applied. At the end of the operation, all sponge, instrument, and needle counts were  correct.  Findings: grossly clear surgical margins and no adenopathy, right side anterior margin is skin.   Estimated Blood Loss:  min         Specimens: left breast tissue with seed, right breast tissue with seed and three right axillary sentinel lymph nodes.             Complications:  None; patient tolerated the procedure well.         Disposition: PACU - hemodynamically stable.         Condition: stable

## 2021-08-17 NOTE — Interval H&P Note (Signed)
History and Physical Interval Note:  08/17/2021 12:54 PM  Sandra Bishop  has presented today for surgery, with the diagnosis of RIGHT BREAST CANCER, LEFT BREAST ALH.  The various methods of treatment have been discussed with the patient and family. After consideration of risks, benefits and other options for treatment, the patient has consented to  Procedure(s): RIGHT BREAST LUMPECTOMY WITH RADIOACTIVE SEED AND SENTINEL LYMPH NODE BIOPSY (Right) RADIOACTIVE SEED GUIDED RIGHT SENTINEL LYMPH NODE EXCISION (Right) RADIOACTIVE SEED GUIDED EXCISIONAL LEFT BREAST BIOPSY (Left) REMOVAL PORT-A-CATH (N/A) as a surgical intervention.  The patient's history has been reviewed, patient examined, no change in status, stable for surgery.  I have reviewed the patient's chart and labs.  Questions were answered to the patient's satisfaction.     [name]

## 2021-08-17 NOTE — Discharge Instructions (Addendum)
[facility] Office Phone Number [phone]  BREAST BIOPSY/ PARTIAL MASTECTOMY: POST OP INSTRUCTIONS  Always review your discharge instruction sheet given to you by the facility where your surgery was performed.  IF YOU HAVE DISABILITY OR FAMILY LEAVE FORMS, YOU MUST BRING THEM TO THE OFFICE FOR PROCESSING.  DO NOT GIVE THEM TO YOUR DOCTOR.  A prescription for pain medication may be given to you upon discharge.  Take your pain medication as prescribed, if needed.  If narcotic pain medicine is not needed, then you may take acetaminophen (Tylenol) or ibuprofen (Advil) as needed. Take your usually prescribed medications unless otherwise directed If you need a refill on your pain medication, please contact your pharmacy.  They will contact our office to request authorization.  Prescriptions will not be filled after 5pm or on week-ends. You should eat very light the first 24 hours after surgery, such as soup, crackers, pudding, etc.  Resume your normal diet the day after surgery. Most patients will experience some swelling and bruising in the breast.  Ice packs and a good support bra will help.  Swelling and bruising can take several days to resolve.  It is common to experience some constipation if taking pain medication after surgery.  Increasing fluid intake and taking a stool softener will usually help or prevent this problem from occurring.  A mild laxative (Milk of Magnesia or Miralax) should be taken according to package directions if there are no bowel movements after 48 hours. Unless discharge instructions indicate otherwise, you may remove your bandages 48 hours after surgery, and you may shower at that time.  You may have steri-strips (small skin tapes) in place directly over the incision.  These strips should be left on the skin for 7-10 days.   Any sutures or staples will be removed at the office during your follow-up visit. ACTIVITIES:  You may resume regular daily activities  (gradually increasing) beginning the next day.  Wearing a good support bra or sports bra (or the breast binder) minimizes pain and swelling.  You may have sexual intercourse when it is comfortable. You may drive when you no longer are taking prescription pain medication, you can comfortably wear a seatbelt, and you can safely maneuver your car and apply brakes. RETURN TO WORK:  __________1 week_______________ You should see your doctor in the office for a follow-up appointment approximately two weeks after your surgery.  Your doctor's nurse will typically make your follow-up appointment when she calls you with your pathology report.  Expect your pathology report 2-3 business days after your surgery.  You may call to check if you do not hear from us after three days.   WHEN TO CALL YOUR DOCTOR: Fever over 101.0 Nausea and/or vomiting. Extreme swelling or bruising. Continued bleeding from incision. Increased pain, redness, or drainage from the incision.  The clinic staff is available to answer your questions during regular business hours.  Please don't hesitate to call and ask to speak to one of the nurses for clinical concerns.  If you have a medical emergency, go to the nearest emergency room or call 911.  A surgeon from [facility] is always on call at the hospital.  For further questions, please visit [email or web address]   

## 2021-08-17 NOTE — H&P (Signed)
PROVIDER: Georgianne Fick, MD Patient Care Team: Sharyne Richters as PCP - General (Family Medicine) Georgianne Fick, MD as Consulting Provider (Surgical Oncology) Rulon Eisenmenger, MD (Hematology and Oncology) Marye Round, MD (Radiation Oncology)  MRN: [id number] DOB: May 10, 1971 DATE OF ENCOUNTER: 07/22/2021 Subjective   Chief Complaint: Right Breast Cancer  History of Present Illness: Sandra Bishop is a 51 y.o. female who is seen today for breast cancer follow up.  Pt presented at age 89 with a screening detected right breast cancer. She was found to have a mass on screening mammography. She underwent dx imaging that showed a 1.2 cm mass at 10 o'clock as well as a suspicious lymph node in the axilla. 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    Of note, she has a sister with stage IV breast cancer and a maternal uncle with colon cancer. Her father had possible pancreatic cancer.   She works as a Consulting civil engineer. She is a former smoker and doesn't drink alcohol. She had menarche at age 21 and is still having periods. She is a Advertising copywriter. She has not had a colonoscopy or bone density study before.    Interval history: She had MRI that had multiple findings, but these are summarized by 3 additional sites on the cancer side (right) that were biopsied, benign, and concordant. The left side had a site biopsied as ALH and excision was recommended. She has received neoadjuvant chemotherapy. She did well overall except for some neuropathy. MRI showed complete imaging response.   MRI 07/20/21 IMPRESSION: 1. Treatment imaging response with no further visualization of known UPPER-OUTER RIGHT breast cancer and abnormal RIGHT axillary lymph node containing metastatic disease. 2. No new or suspicious findings within either breast.    RECOMMENDATION: Treatment plan   BI-RADS CATEGORY 6: Known biopsy-proven malignancy.   Review of Systems: A complete review of systems was obtained from the patient. I have reviewed this information and discussed as appropriate with the patient. See HPI as well for other ROS.  Review of Systems  All other systems reviewed and are negative.   Medical History: Past Medical History:  Diagnosis Date   Arthritis   COPD (chronic obstructive pulmonary disease) (CMS-HCC)   Diabetes mellitus without complication (CMS-HCC)   Patient Active Problem List  Diagnosis   Malignant neoplasm of upper-outer quadrant of right breast in female, estrogen receptor positive (CMS-HCC)   Family history of breast cancer in sister   Breast cancer metastasized to axillary lymph node, right (CMS-HCC)   Atypical lobular 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ALH) of left breast   Past Surgical History:  Procedure Laterality Date   Insertion Port-A-Cath 03/02/2021  Dr. Barry Dienes   LAPAROSCOPIC TUBAL LIGATION   PHOTOREFRACTIVE KERATOTOMY/LASIK   wisdom teeth removal    No Known Allergies  Current Outpatient Medications on File Prior to Visit  Medication Sig Dispense Refill   aspirin 81 MG chewable tablet Take 81 mg by mouth once daily   cholecalciferol (VITAMIN D3) 1000 unit capsule Take 1,000 Units by mouth once daily   rosuvastatin (CRESTOR) 20 MG tablet Take 20 mg by mouth once daily   semaglutide (RYBELSUS) 3 mg tablet Take 3 mg by mouth once daily Do not cut, crush, or chew   No current facility-administered medications on file prior to visit.   Family History  Problem Relation Age of  Onset   Diabetes Mother   Pancreatic cancer Father   Breast cancer Sister    Social History   Tobacco Use  Smoking Status Former  Smokeless Tobacco Never    Social History   Socioeconomic History   Marital status: Unknown  Tobacco Use   Smoking status: Former   Smokeless tobacco: Never  Scientific laboratory technician Use: Never used   Substance and Sexual Activity   Alcohol use: Never   Drug use: Never   Objective:   Vitals:  07/22/21 1135  BP: (!) 148/84  Pulse: 103  Temp: 36.7 C (98 F)  SpO2: 97%  Weight: (!) 103.9 kg (229 lb)  Height: 162.6 cm (5\' 4" )   Body mass index is 39.31 kg/m.  Head: Normocephalic and atraumatic.  Eyes: Conjunctivae are normal. Pupils are equal, round, and reactive to light. No scleral icterus.  Neck: Normal range of motion. Neck supple. No tracheal deviation present. No thyromegaly present.  Resp: No respiratory distress, normal effort. Breast:no palpable mass. No LAD. Ptotic bilaterally. Relatively symmetric Abd: Abdomen is soft, non distended and non tender. No masses are palpable. There is no rebound and no guarding.  Neurological: Alert and oriented to person, place, and time. Coordination normal.  Skin: Skin is warm and dry. No rash noted. No diaphoretic. No erythema. No pallor.  Psychiatric: Normal mood and affect. Normal behavior. Judgment and thought content normal.   Labs, Imaging and Diagnostic Testing:  07/08/21 CMET with elevated glucose 224 CBC HCT 31.9  Assessment and Plan:   Diagnoses and all orders for this visit:  Malignant neoplasm of upper-outer quadrant of right breast in female, estrogen receptor positive (CMS-HCC) Assessment & Plan: Will plan seed localized lumpectomy, seed targeted right lymph node excision and dual tracer SLN bx.   Port will be removed.    The surgical procedure was described to the patient. I discussed the incision type and location and that we would need radiology involved on with a wire or seed marker and/or sentinel node.   The risks and benefits of the procedure were described to the patient and she wishes to proceed.   We discussed the risks bleeding, infection, damage to other structures, need for further procedures/surgeries. We discussed the risk of seroma. The patient was advised if the area in the breast in cancer, we  may need to go back to surgery for additional tissue to obtain negative margins or for a lymph node biopsy. The patient was advised that these are the most common complications, but that others can occur as well. They were advised against taking aspirin or other anti-inflammatory agents/blood thinners the week before surgery.   Breast cancer metastasized to axillary lymph node, right (CMS-HCC)  Atypical lobular hyperplasia [facility]) of left breast Assessment & Plan: Will plan seed localized excision.    Georgianne Fick, MD

## 2021-08-17 NOTE — Anesthesia Procedure Notes (Signed)
Procedure Name: Intubation Date/Time: 08/17/2021 1:15 PM Performed by: Erick Colace, CRNA Pre-anesthesia Checklist: Patient identified, Emergency Drugs available, Suction available and Patient being monitored Patient Re-evaluated:Patient Re-evaluated prior to induction Oxygen Delivery Method: Circle system utilized Preoxygenation: Pre-oxygenation with 100% oxygen Induction Type: IV induction Ventilation: Mask ventilation without difficulty Laryngoscope Size: Mac and 4 Grade View: Grade II Tube type: Oral Tube size: 7.0 mm Number of attempts: 1 Airway Equipment and Method: Stylet and Oral airway Placement Confirmation: ETT inserted through vocal cords under direct vision, positive ETCO2 and breath sounds checked- equal and bilateral Secured at: 21 cm Tube secured with: Tape Dental Injury: Teeth and Oropharynx as per pre-operative assessment

## 2021-08-17 NOTE — Anesthesia Postprocedure Evaluation (Signed)
Anesthesia Post Note  Patient: Sandra Bishop  Procedure(s) Performed: RIGHT BREAST LUMPECTOMY WITH RADIOACTIVE SEED AND SENTINEL LYMPH NODE BIOPSY (Right: Breast) RADIOACTIVE SEED GUIDED RIGHT SENTINEL LYMPH NODE EXCISION (Right: Breast) RADIOACTIVE SEED GUIDED EXCISIONAL LEFT BREAST BIOPSY (Left: Breast) REMOVAL PORT-A-CATH     Patient location during evaluation: PACU Anesthesia Type: General Level of consciousness: awake and alert, oriented and patient cooperative Pain management: pain level controlled Vital Signs Assessment: post-procedure vital signs reviewed and stable Respiratory status: spontaneous breathing, nonlabored ventilation and respiratory function stable Cardiovascular status: blood pressure returned to baseline and stable Postop Assessment: no apparent nausea or vomiting Anesthetic complications: no   No notable events documented.  Last Vitals:  Vitals:   08/17/21 1550 08/17/21 1605  BP: (!) 133/98 (!) 148/92  Pulse: 87   Resp: 17   Temp: 36.4 C 36.4 C  SpO2: 99%     Last Pain:  Vitals:   08/17/21 1605  TempSrc:   PainSc: 0-No pain                 Pervis Hocking

## 2021-08-18 ENCOUNTER — Encounter (HOSPITAL_COMMUNITY): Payer: Self-pay | Admitting: General Surgery

## 2021-08-19 LAB — SURGICAL PATHOLOGY

## 2021-08-24 ENCOUNTER — Telehealth: Payer: Self-pay | Admitting: *Deleted

## 2021-08-24 ENCOUNTER — Other Ambulatory Visit: Payer: Self-pay | Admitting: *Deleted

## 2021-08-24 MED ORDER — FLUCONAZOLE 150 MG PO TABS: 150.0000 mg | ORAL_TABLET | Freq: Every day | ORAL | 0 refills | Status: DC

## 2021-08-24 NOTE — Telephone Encounter (Signed)
Received VM from pt, RN attempt x1 to contact pt.  No answer, LVM for pt to return call tot he office.

## 2021-08-24 NOTE — Progress Notes (Signed)
Received call from pt with complaint of vaginal irritation and white discharge x2 weeks not alleviated by OTC Monistat.  Pt denies foul vaginal odor.  Pt states GYN is not able to see her for a few weeks. Per MD pt to be prescribed Diflucan 150 mg p.o x1 and if symptoms do not resolve, pt educated to f/u with GYN.  Pt verbalized understanding.

## 2021-08-25 ENCOUNTER — Encounter: Payer: Self-pay | Admitting: *Deleted

## 2021-08-26 NOTE — Progress Notes (Signed)
Patient Care Team: [name], MD as PCP - General (Family Medicine) [name], RN as Oncology Nurse Navigator [name], RN as Oncology Nurse Navigator [name], MD as Consulting Physician (General Surgery) [name], MD as Consulting Physician (Hematology and Oncology) [name], MD as Consulting Physician (Radiation Oncology)  DIAGNOSIS:    ICD-10-CM   1. Malignant neoplasm of upper-outer quadrant of right breast in female, estrogen receptor positive ([place])  C50.411    Z17.0       SUMMARY OF ONCOLOGIC HISTORY: Oncology History  Malignant neoplasm of upper-outer quadrant of right breast in female, estrogen receptor positive ([place])  02/15/2021 Initial Diagnosis   Screening mammogram: a possible mass in the right breast  Diagnostic mammogram and [place]: a persistent irregular mass 1.2 cm within the upper-outer right breast. 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    02/23/2021 Cancer Staging   Staging form: Breast, AJCC 8th Edition - Clinical stage from 02/23/2021: Stage IIB (cT1c, cN1(f), cM0, G3, ER+, PR-, HER2-) - Signed by [name], MD on 02/23/2021 Stage prefix: Initial diagnosis Method of lymph node assessment: Core biopsy Histologic grading system: 3 grade system    03/04/2021 - 07/01/2021 Neo-Adjuvant Chemotherapy   Received four cycles of neoadjuvant Adriamycin and Cytoxan from 03/04/2021-04/14/2021.  Followed by 9 cycles of Taxol from 04/28/2021-07/01/2021.  Taxol was dose reduced beginning with cycle 6 due to restless legs and stopped after 9 cycles due to peripheral neuropathy.    03/10/2021 Genetic Testing   Negative genetic testing. No pathogenic variants identified on the [facility] CancerNext-Expanded+RNA panel. The report date is 03/10/2021.  The CancerNext-Expanded + RNAinsight gene panel offered by [employer] and includes sequencing and rearrangement analysis  for the following 77 genes: IP, ALK, APC*, ATM*, AXIN2, BAP1, BARD1, BLM, BMPR1A, BRCA1*, BRCA2*, BRIP1*, CDC73, CDH1*,CDK4, CDKN1B, CDKN2A, CHEK2*, CTNNA1, DICER1, FANCC, FH, FLCN, GALNT12, KIF1B, LZTR1, MAX, MEN1, MET, MLH1*, MSH2*, MSH3, MSH6*, MUTYH*, NBN, NF1*, NF2, NTHL1, PALB2*, PHOX2B, PMS2*, POT1, PRKAR1A, PTCH1, PTEN*, RAD51C*, RAD51D*,RB1, RECQL, RET, SDHA, SDHAF2, SDHB, SDHC, SDHD, SMAD4, SMARCA4, SMARCB1, SMARCE1, STK11, SUFU, TMEM127, TP53*,TSC1, TSC2, VHL and XRCC2 (sequencing and deletion/duplication); EGFR, EGLN1, HOXB13, KIT, MITF, PDGFRA, POLD1 and POLE (sequencing only); EPCAM and GREM1 (deletion/duplication only).     CHIEF COMPLIANT: Follow-up of right breast cancer  INTERVAL HISTORY: Sandra Bishop is a 51 y.o. with above-mentioned history of invasive ductal carcinoma of the right breast having undergone chemotherapy with dose dense Adriamycin and Cytoxan, followed by Taxol.  She completed 9 cycles and stopped early because of restless legs and pain in the legs.  She underwent lumpectomy and sentinel lymph node/targeted node dissection.  She has had some pain and discomfort in the breast for which she had seen surgery.  Overall she thinks she is getting better this week.  ALLERGIES:  has No Known Allergies.  MEDICATIONS:  Current Outpatient Medications  Medication Sig Dispense Refill   aspirin EC 81 MG tablet Take 81 mg by mouth daily. Swallow whole.     blood glucose meter kit and supplies Dispense based on patient and insurance preference. Use up to four times daily as directed. (FOR ICD-10 E10.9, E11.9).  ( One-Touch Verio meter) 1 each 0   Cholecalciferol (VITAMIN D3) 50 MCG (2000 UT) TABS Take 2,000 Units by mouth daily.     fluconazole (DIFLUCAN) 150 MG tablet Take 1 tablet (150 mg total) by mouth daily. 1 tablet  0   Lancets (ONETOUCH ULTRASOFT) lancets Use to check blood glucose three times daily. Use as instructed. E11.9. 100 each 12   magnesium oxide (MAG-OX) 400  (240 Mg) MG tablet Take 1 tablet (400 mg total) by mouth daily. 30 tablet 2   ONETOUCH VERIO test strip TEST FOUR TIMES DAILY AS DIRECTED 100 strip 2   oxyCODONE (OXY IR/ROXICODONE) 5 MG immediate release tablet Take 1 tablet (5 mg total) by mouth every 6 (six) hours as needed for severe pain. 8 tablet 0   rosuvastatin (CRESTOR) 5 MG tablet Take 5 mg by mouth every Tuesday.     RYBELSUS 7 MG TABS Take 7 mg by mouth every morning.     No current facility-administered medications for this visit.    PHYSICAL EXAMINATION: ECOG PERFORMANCE STATUS: 1 - Symptomatic but completely ambulatory  Vitals:   08/29/21 0913  BP: (!) 146/71  Pulse: (!) 101  Resp: 18  Temp: 97.8 F (36.6 C)  SpO2: 100%   Filed Weights   08/29/21 0913  Weight: 228 lb 12.8 oz (103.8 kg)      LABORATORY DATA:  I have reviewed the data as listed CMP Latest Ref Rng & Units 08/12/2021 07/08/2021 07/01/2021  Glucose 70 - 99 mg/dL 154(H) 224(H) 187(H)  BUN 6 - 20 mg/dL _0 Creatinine 0.44 - 1.00 mg/dL 0.92 0.78 0.81  Sodium 135 - 145 mmol/L 135 137 139  Potassium 3.5 - 5.1 mmol/L 3.9 4.0 4.1  Chloride 98 - 111 mmol/L 104 105 106  CO2 22 - 32 mmol/L _1 Calcium 8.9 - 10.3 mg/dL 9.3 9.2 8.9  Total Protein 6.5 - 8.1 g/dL - 7.6 7.9  Total Bilirubin 0.3 - 1.2 mg/dL - 0.3 0.3  Alkaline Phos 38 - 126 U/L - 76 90  AST 15 - 41 U/L - 19 15  ALT 0 - 44 U/L - 19 22    Lab Results  Component Value Date   WBC 7.2 08/12/2021   HGB 11.8 (L) 08/12/2021   HCT 38.8 08/12/2021   MCV 90.0 08/12/2021   PLT 233 08/12/2021   NEUTROABS 3.2 07/08/2021    ASSESSMENT & PLAN:  Malignant neoplasm of upper-outer quadrant of right breast in female, estrogen receptor positive ([place]) 02/15/2021:Screening mammogram: a possible mass in the right breast  Diagnostic mammogram and [place]: a persistent irregular mass 1.2 cm within the upper-outer right breast. Biopsy: Grade 3 invasive ductal carcinoma metastatic to right axillary lymph  node, Her2-, PR-, ER+ (70%), Ki67 (80%).    Recommendation based on multidisciplinary tumor board: 1. Neoadjuvant chemotherapy with Adriamycin and Cytoxan dose dense 4 followed by Taxol weekly 9 (stopped early for AEs)  2. Followed by breast conserving surgery with  targeted axillary dissection: 08/05/2021: Pathologic complete response, 0/3 lymph nodes negative 3. Followed by adjuvant radiation therapy 4.  Followed by adjuvant antiestrogen therapy (patient was premenopausal prior to chemo)  ------------------------------------------------------------------------------------------------------------------------------------   Pathology counseling: I discussed the final pathology report of the patient provided  a copy of this report. I discussed the margins as well as lymph node surgeries. We also discussed the final staging along with previously performed ER/PR and HER-2/neu testing.  Return to clinic after radiation is complete to start antiestrogen therapy.  We might consider treating her with tamoxifen.  We might also need to do blood work for menopause down the line.    No orders of the defined types were placed in this encounter.  The patient has  a good understanding of the overall plan. she agrees with it. she will call with any problems that may develop before the next visit here.  Total time spent: 30 mins including face to face time and time spent for planning, charting and coordination of care  Rulon Eisenmenger, MD, MPH 08/29/2021  I, Thana Ates, am acting as scribe for Dr. [name].  I have reviewed the above documentation for accuracy and completeness, and I agree with the above.

## 2021-08-29 ENCOUNTER — Other Ambulatory Visit: Payer: Self-pay

## 2021-08-29 ENCOUNTER — Inpatient Hospital Stay: Payer: BC Managed Care – PPO | Attending: Hematology and Oncology | Admitting: Hematology and Oncology

## 2021-08-29 DIAGNOSIS — C773 Secondary and unspecified malignant neoplasm of axilla and upper limb lymph nodes: Secondary | ICD-10-CM | POA: Insufficient documentation

## 2021-08-29 DIAGNOSIS — C50411 Malignant neoplasm of upper-outer quadrant of right female breast: Secondary | ICD-10-CM | POA: Insufficient documentation

## 2021-08-29 DIAGNOSIS — Z170 Estrogen receptor positive status [ER+]: Secondary | ICD-10-CM | POA: Diagnosis not present

## 2021-08-29 NOTE — Assessment & Plan Note (Addendum)
02/15/2021:Screening mammogram: a possible mass in the right breast Diagnostic mammogram and [place]: a persistent irregular mass 1.2 cm within the upper-outer right breast. 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  Recommendationbased on multidisciplinary tumor board: 1. Neoadjuvant chemotherapy with Adriamycin and Cytoxan dose dense 4 followed by Taxol weekly 9 (stopped early for AEs)  2. Followed by breast conserving surgery with targeted axillary dissection: 08/05/2021: Pathologic complete response, 0/3 lymph nodes negative 3. Followed by adjuvant radiation therapy 4.Followed by adjuvant antiestrogen therapy (patient was premenopausal prior to chemo) ------------------------------------------------------------------------------------------------------------------------------------   Pathology counseling: I discussed the final pathology report of the patient provided  a copy of this report. I discussed the margins as well as lymph node surgeries. We also discussed the final staging along with previously performed ER/PR and HER-2/neu testing.  Return to clinic after radiation is complete to start antiestrogen therapy.  We might consider treating her with tamoxifen.  We might also need to do blood work for menopause down the line.

## 2021-09-01 ENCOUNTER — Telehealth: Payer: Self-pay

## 2021-09-01 NOTE — Telephone Encounter (Signed)
[employer] disability form 7A and page two only received of form 703 completed and faxed to [place] Department fax No. #: [phone]. Fax transmission confirmation received. Included current medication list,  and August 17, 2021 procedural/surgical notes as directed per 7A form.     Copy mailed to Patient as requested. Release of Information Form and Forms forwarded to [facility] for [place].I.M. staff to forward to [facility] ([place]) Information Management Office, Phone: [phone], Fax: [phone] to complete process, submitting further records to [facility] of absence Diplomatic Services operational officer.

## 2021-09-07 ENCOUNTER — Other Ambulatory Visit: Payer: Self-pay

## 2021-09-07 ENCOUNTER — Ambulatory Visit: Payer: BC Managed Care – PPO | Attending: Hematology and Oncology

## 2021-09-07 DIAGNOSIS — R293 Abnormal posture: Secondary | ICD-10-CM

## 2021-09-07 DIAGNOSIS — Z170 Estrogen receptor positive status [ER+]: Secondary | ICD-10-CM | POA: Insufficient documentation

## 2021-09-07 DIAGNOSIS — M25611 Stiffness of right shoulder, not elsewhere classified: Secondary | ICD-10-CM | POA: Diagnosis present

## 2021-09-07 DIAGNOSIS — R600 Localized edema: Secondary | ICD-10-CM | POA: Diagnosis present

## 2021-09-07 DIAGNOSIS — C50411 Malignant neoplasm of upper-outer quadrant of right female breast: Secondary | ICD-10-CM | POA: Diagnosis not present

## 2021-09-07 NOTE — Patient Instructions (Addendum)
Axillary web syndrome (also called cording) can happen after having breast cancer surgery when lymph nodes in the armpit are removed. It presents as if you have a thin cord in your arm and can run from the armpit all the way down into the forearm. If youve had a sentinel node biopsy, the risk is 1-20% and if youve had an axillary lymph node dissection (more than 7 nodes removed), the risk is 36-72%. The ranges vary depending on the research study.  It most often happens 3-4 weeks post-op but can happen sooner or later. There are several possibilities for what cording actually is. Although no one knows for sure as of yet, it may be related to lymphatics, veins, or other tissue. Sometimes cording resolves on its own but other times it requires physical therapy with a therapist who specializes in lymphedema and/or cancer rehab. Treatment typically involves stretching, manual techniques, and exercise. Sometimes cords get released while stretching or during manual treatment and the patient may experience the sensation of a pop. This may feel strange but it is not dangerous and is a sign that the cord has released; range of motion may be improved in the process.      [facility]  [address], [address]  [place] [address]  [phone]  After Breast Cancer Class It is recommended you attend the ABC class to be educated on lymphedema risk reduction. This class is free of charge and lasts for 1 hour. It is a 1-time class. You will need to download the Webex app either on your phone or computer. We will send you a link the night before or the morning of the class. You should be able to click on that link to join the class. This is not a confidential class. You don't have to turn your camera on, but other participants may be able to see your email address.  Scar massage You can begin gentle scar massage to you incision sites. Gently place one hand on the incision and move the  skin (without sliding on the skin) in various directions. Do this for a few minutes and then you can gently massage either coconut oil or vitamin E cream into the scars.  Compression garment You should continue wearing your compression bra until you feel like you no longer have swelling.  Home exercise Program Continue doing the exercises you were given until you feel like you can do them without feeling any tightness at the end.   Walking Program Studies show that 30 minutes of walking per day (fast enough to elevate your heart rate) can significantly reduce the risk of a cancer recurrence. If you can't walk due to other medical reasons, we encourage you to find another activity you could do (like a stationary bike or water exercise).  Posture After breast cancer surgery, people frequently sit with rounded shoulders posture because it puts their incisions on slack and feels better. If you sit like this and scar tissue forms in that position, you can become very tight and have pain sitting or standing with good posture. Try to be aware of your posture and sit and stand up tall to heal properly.  Follow up PT: It is recommended you return every 3 months for the first 3 years following surgery to be assessed on the SOZO machine for an L-Dex score. This helps prevent clinically significant lymphedema in 95% of patients. These follow up screens are 10 minute appointments that you are not billed  for.

## 2021-09-07 NOTE — Therapy (Addendum)
[facility] @ [place], [place], [address] Phone: [phone]   Fax:  [phone]  Physical Therapy Treatment  Patient Details  Name: Sandra Bishop MRN: [id number] Date of Birth: Oct 04, 1970 Referring Provider (PT): Dr. Lindi Adie   Encounter Date: 09/07/2021   PT End of Session - 09/07/21 1201     Visit Number 2    Number of Visits 14    Date for PT Re-Evaluation 10/19/21    PT Start Time 1104    PT Stop Time 1152    PT Time Calculation (min) 48 min    Activity Tolerance Patient tolerated treatment well    Behavior During Therapy [facility] for tasks assessed/performed             Past Medical History:  Diagnosis Date   Allergy    Anemia    Breast cancer ([place])    Right   Constipation    Diabetes mellitus without complication ([place])    Family history of adverse reaction to anesthesia    Aunt is hard to wake up from general anesthesia   Family history of breast cancer    Family history of colon cancer    Family history of lung cancer    Family history of pancreatic cancer    History of migraine    during pregancy   Obesity    Palpitations     Past Surgical History:  Procedure Laterality Date   BREAST BIOPSY Right 02/11/2021   x2   BREAST BIOPSY Right 03/22/2021   x3   BREAST LUMPECTOMY WITH RADIOACTIVE SEED AND SENTINEL LYMPH NODE BIOPSY Right 08/17/2021   Procedure: RIGHT BREAST LUMPECTOMY WITH RADIOACTIVE SEED AND SENTINEL LYMPH NODE BIOPSY;  Surgeon: [name], MD;  Location: [facility];  Service: General;  Laterality: Right;   EYE SURGERY Right    PORT-A-CATH REMOVAL N/A 08/17/2021   Procedure: REMOVAL PORT-A-CATH;  Surgeon: [name], MD;  Location: [facility];  Service: General;  Laterality: N/A;   PORTACATH PLACEMENT N/A 03/02/2021   Procedure: INSERTION PORT-A-CATH;  Surgeon: [name], MD;  Location: [facility];  Service: General;  Laterality: N/A;   RADIOACTIVE SEED GUIDED AXILLARY SENTINEL  LYMPH NODE Right 08/17/2021   Procedure: RADIOACTIVE SEED GUIDED RIGHT SENTINEL LYMPH NODE EXCISION;  Surgeon: [name], MD;  Location: [facility];  Service: General;  Laterality: Right;   RADIOACTIVE SEED GUIDED EXCISIONAL BREAST BIOPSY Left 08/17/2021   Procedure: RADIOACTIVE SEED GUIDED EXCISIONAL LEFT BREAST BIOPSY;  Surgeon: [name], MD;  Location: [facility];  Service: General;  Laterality: Left;   TUBAL LIGATION      There were no vitals filed for this visit.   Subjective Assessment - 09/07/21 1107     Subjective The surgery went fine but my right arm is really painful in the axillary region and in my arm. She had right lumpectomy with SLNB on 08/17/2021. She reports doing exercises 1-3 times per day. Supposed to be taking gabapentin 2 times/day    Pertinent History Pts.Screening mammogram determined  mass in the right breast  Diagnostic mammogram and [place]: a persistent irregular mass 1.2 cm within the upper-outer right breast. 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 Pt has finished neoadjuvant chemo and had right Lumpectomy with SLNB on 08/17/2021 with 0/3 LN's. She will have radiation and anti-estrogens    Patient Stated Goals Post op reassessment;Decrease arm pain, improve ROM  Currently in Pain? Yes    Pain Score 6     Pain Location Axilla   and arm   Pain Orientation Right    Pain Descriptors / Indicators Sharp;Aching;Burning    Pain Type Surgical pain    Pain Onset 1 to 4 weeks ago    Pain Frequency Constant    Aggravating Factors  disturbed sleep, reaching to cabinets, dressing    Effect of Pain on Daily Activities limits daily activities                [facility] PT Assessment - 09/07/21 0001       Assessment   Medical Diagnosis Right breast Cancer    Referring Provider (PT) Dr. Lindi Adie    Hand Dominance Right      Precautions   Precaution Comments lymphedema risk      Prior Function   Level of  Independence Independent      Cognition   Overall Cognitive Status Within Functional Limits for tasks assessed      Observation/Other Assessments   Observations right breast incision healing with scabs still present. Lateral breast with moderate swelling noted.  Multiple cords noted in axillary region and running across antecubital fossa, and 1 running vertically medial to shoulder      Posture/Postural Control   Posture Comments FWD head, round shoulder      AROM   Right Shoulder Extension 44 Degrees    Right Shoulder Flexion 117 Degrees    Right Shoulder ABduction 125 Degrees    Right Shoulder External Rotation 86 Degrees            Quick dash:36.36   LYMPHEDEMA/ONCOLOGY QUESTIONNAIRE - 09/07/21 0001       Type   Cancer Type Right breast CA.      Surgeries   Lumpectomy Date 08/17/21    Sentinel Lymph Node Biopsy Date 08/17/21      What other symptoms do you have   Are you Having Heaviness or Tightness Yes    Are you having Pain Yes    Are you having pitting edema No    Is it Hard or Difficult finding clothes that fit No    Do you have infections No    [place]mer Sign No      Right Upper Extremity Lymphedema   10 cm Proximal to Olecranon Process 38.5 cm    Olecranon Process 27.3 cm    10 cm Proximal to Ulnar Styloid Process 24.2 cm    Just Proximal to Ulnar Styloid Process 17.8 cm    At Base of 2nd Digit 6.5 cm                        [facility] Adult PT Treatment/Exercise - 09/07/21 0001       Manual Therapy   Edema Management prescription given for compression bra to decrease swelling    Myofascial Release gentle MFR at axilla and antecubital fossa area of cording                          PT Long Term Goals - 09/07/21 1212       PT LONG TERM GOAL #1   Title Pt will regain right shoulder AROM within 5-10 degrees of baseline for ability to peform home and work activities    Time 6    Period Weeks    Status New    Target Date  10/19/21  PT LONG TERM GOAL #2   Title Pain and tightness from cording decreased atleast 50%    Time 6    Period Weeks    Status New    Target Date 10/19/21      PT LONG TERM GOAL #3   Title Pt will attend ABC and will have a good understanding of skin care and precautions for lymphedema    Time 6    Period Weeks    Status New    Target Date 10/19/21      PT LONG TERM GOAL #4   Title Pt will  purchase compression bra and will have 50% reduction in edema    Time 6    Period Weeks    Status New    Target Date 10/19/21      PT LONG TERM GOAL #5   Title Pt will be educated and compliant with MLD as need for right breast swelling    Time 6    Period Weeks    Target Date 10/19/21                   Plan - 09/07/21 1205     Clinical Impression Statement Pt was here for post of reassessment after surgery on 08/17/2021 for right lumpectomy with SLNB.  She has significant complaints of right UE pain, determined to be cording in the axilla to the SLNB scar and also down the right UE into the forearm.  Her right shoulder ROM is limited by pain and tightness from cording.  She has a moderate amount of right breast swelling and a compression bra was recommended and a script given. Incisions are healing with thickening noted under the SLNB scar.  She was educated in scar massage to that area.  Her breast incision still has multiple scabs present so she will not massage this area yet.  She will benefit from skilled therapy to address cording  ROM, pain, and swelling    Personal Factors and Comorbidities Comorbidity 2    Comorbidities Breast Cancer with 3 LN removed, chemo, pending radiation, DM    Examination-Activity Limitations Reach Overhead;Dressing;Lift    Examination-Participation Restrictions Occupation    Stability/Clinical Decision Making Stable/Uncomplicated    Clinical Decision Making Low    Rehab Potential Excellent    PT Frequency 2x / week    PT Duration 6 weeks     PT Treatment/Interventions Therapeutic exercise;Patient/family education;Orthotic Fit/Training;Manual techniques;Passive range of motion;Scar mobilization;Manual lymph drainage;Vasopneumatic Device    PT Next Visit Plan MFR to cording, PROM, scar mobilization to SLNB scar, ?pulleys, did she get compression bra?, MLD prn.    PT Home Exercise Plan 4 post op exercises, wall chest stretch with wrist oscillations    Recommended Other Services compression bra, 3 month SOZO screens, ABC (3/6)    Consulted and Agree with Plan of Care Patient             Patient will benefit from skilled therapeutic intervention in order to improve the following deficits and impairments:  Decreased range of motion, Increased fascial restricitons, Impaired UE functional use, Decreased activity tolerance, Decreased knowledge of precautions, Pain, Decreased scar mobility, Impaired flexibility, Decreased strength, Increased edema, Postural dysfunction  Visit Diagnosis: Malignant neoplasm of upper-outer quadrant of right breast in female, estrogen receptor positive ([facility])  Abnormal posture  Stiffness of right shoulder, not elsewhere classified  Localized edema     Problem List Patient Active Problem List   Diagnosis Date Noted   Hyperglycemia  due to type 2 diabetes mellitus ([place]) 07/08/2021   Pure hypercholesterolemia 07/08/2021   Morbid obesity ([place]) 07/08/2021   Chemotherapy-induced peripheral neuropathy ([place]) 07/08/2021   Genetic testing 03/17/2021   Port-A-Cath in place 03/03/2021   Family history of breast cancer 02/24/2021   Family history of pancreatic cancer 02/24/2021   Family history of colon cancer 02/24/2021   Family history of lung cancer 02/24/2021   Malignant neoplasm of upper-outer quadrant of right breast in female, estrogen receptor positive ([place]) 02/15/2021   Obesity    Constipation 08/30/2011    Claris Pong, PT 09/07/2021, 12:15 PM  [facility] @ [place], [place], [address] Phone: [phone]   Fax:  [phone]  Name: Sandra Bishop MRN: [id number] Date of Birth: Jan 24, 1971

## 2021-09-12 ENCOUNTER — Telehealth: Payer: Self-pay

## 2021-09-12 NOTE — Telephone Encounter (Signed)
Spoke w/ patient, verified identity and reminded patient of her 9:00am-09/13/21 telephone appointment w/ Shona Simpson PA-C. I left my extension [phone] in case patient needs anything. Patient verbalized understanding of information.

## 2021-09-12 NOTE — Progress Notes (Signed)
Radiation Oncology         [phone] ________________________________  Name: Sandra Bishop        MRN: [id number]  Date of Service: 09/13/2021 DOB: 20-Nov-1970  [id number], Anastasia Pall, MD  [name], MD     REFERRING PHYSICIAN: [name], MD   DIAGNOSIS: The encounter diagnosis was Malignant neoplasm of upper-outer quadrant of right breast in female, estrogen receptor positive ([place]).   HISTORY OF PRESENT ILLNESS: Sandra Bishop is a 50 y.o. female originally seen in the multidisciplinary breast clinic for a new diagnosis of right breast cancer. The patient was noted to have a screening detected mass in the right breast. She was found on diagnostic imaging to have a 1.2 cm mass in the 10:00 position and one abnormal node in her right axilla. 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   Since the patient's last visit, pre-[place] MRI revealed progressive non-mass enhancement in the lower outer quadrant of the right breast measuring up to 4.3 cm and biopsy was recommended.  There were 2 other indeterminate right breast masses, and the 1 biopsy-proven lymph node that was metastatic was noted without any additional in the right axilla.  The left breast has a indeterminate mass in the upper inner left breast likely a fibroadenoma and no evidence of left axillary adenopathy was present.  Following this she did undergo additional biopsies on 03/23/2021 of the right breast all of which showed noncancerous changes mostly fibrocystic and fibroadenomatoid change as well as some ductal hyperplasia.  Biopsies on 03/24/2021 of the left upper inner quadrant of the breast also was consistent with fibroadenoma with lobular neoplasia.  No malignancy was identified.  She began neoadjuvant chemotherapy on 03/04/2021.  Her last dose was administered on 04/14/2021.  Her post chemotherapy MRI on 07/20/2021 showed  treatment response with no more visualization of the known cancer in the right breast or lymph node.  She underwent right lumpectomy with sentinel lymph node biopsy on 08/17/2021, and final pathology showed mild fibrocystic change within the left breast specimen that was sampled, and in the right, no residual disease was noted with complete therapeutic response.  Similarly in the axilla 3 sentinel lymph nodes were sampled and all were negative for disease.  She is seen today to discuss adjuvant radiotherapy.   PREVIOUS RADIATION THERAPY: No   PAST MEDICAL HISTORY:  Past Medical History:  Diagnosis Date   Allergy    Anemia    Breast cancer ([place])    Right   Constipation    Diabetes mellitus without complication (HCC)    Family history of adverse reaction to anesthesia    Aunt is hard to wake up from general anesthesia   Family history of breast cancer    Family history of colon cancer    Family history of lung cancer    Family history of pancreatic cancer    History of migraine    during pregancy   Obesity    Palpitations        PAST SURGICAL HISTORY: Past Surgical History:  Procedure Laterality Date   BREAST BIOPSY Right 02/11/2021   x2   BREAST BIOPSY Right 03/22/2021   x3   BREAST LUMPECTOMY WITH RADIOACTIVE SEED AND SENTINEL LYMPH NODE BIOPSY Right 08/17/2021   Procedure: RIGHT BREAST LUMPECTOMY WITH RADIOACTIVE SEED AND SENTINEL LYMPH NODE BIOPSY;  Surgeon: [name], MD;  Location: [facility];  Service: General;  Laterality: Right;   EYE SURGERY Right    PORT-A-CATH REMOVAL N/A 08/17/2021   Procedure: REMOVAL PORT-A-CATH;  Surgeon: [name], MD;  Location: [facility];  Service: General;  Laterality: N/A;   PORTACATH PLACEMENT N/A 03/02/2021   Procedure: INSERTION PORT-A-CATH;  Surgeon: [name], MD;  Location: [facility];  Service: General;  Laterality: N/A;   RADIOACTIVE SEED GUIDED AXILLARY SENTINEL LYMPH NODE Right 08/17/2021   Procedure: RADIOACTIVE SEED GUIDED RIGHT  SENTINEL LYMPH NODE EXCISION;  Surgeon: [name], MD;  Location: [facility];  Service: General;  Laterality: Right;   RADIOACTIVE SEED GUIDED EXCISIONAL BREAST BIOPSY Left 08/17/2021   Procedure: RADIOACTIVE SEED GUIDED EXCISIONAL LEFT BREAST BIOPSY;  Surgeon: [name], MD;  Location: [facility];  Service: General;  Laterality: Left;   TUBAL LIGATION       FAMILY HISTORY:  Family History  Problem Relation Age of Onset   Hypertension Mother    Pancreatic cancer Father 58   Breast cancer Half-Sister 40       negative genetic testing   Colon cancer Maternal Uncle        dx >50   Lung cancer Maternal Uncle        dx 62s, hx smoking   Cancer Paternal Aunt        unknown type, dx early 54s   Heart failure Maternal Grandmother    Breast cancer Other 31       mother's first cousin     SOCIAL HISTORY:  reports that she quit smoking about 10 years ago. Her smoking use included cigarettes. She has never used smokeless tobacco. She reports that she does not currently use alcohol. She reports that she does not use drugs. The patient lives in [place] and is divorced. She works as a Consulting civil engineer for [employer]. She enjoys spending time with her family.    ALLERGIES: Patient has no known allergies.   MEDICATIONS:  Current Outpatient Medications  Medication Sig Dispense Refill   aspirin EC 81 MG tablet Take 81 mg by mouth daily. Swallow whole.     blood glucose meter kit and supplies Dispense based on patient and insurance preference. Use up to four times daily as directed. (FOR ICD-10 E10.9, E11.9).  ( One-Touch Verio meter) 1 each 0   Cholecalciferol (VITAMIN D3) 50 MCG (2000 UT) TABS Take 2,000 Units by mouth daily.     fluconazole (DIFLUCAN) 150 MG tablet Take 1 tablet (150 mg total) by mouth daily. 1 tablet 0   Lancets (ONETOUCH ULTRASOFT) lancets Use to check blood glucose three times daily. Use as instructed. E11.9. 100 each 12   magnesium oxide (MAG-OX) 400 (240  Mg) MG tablet Take 1 tablet (400 mg total) by mouth daily. 30 tablet 2   ONETOUCH VERIO test strip TEST FOUR TIMES DAILY AS DIRECTED 100 strip 2   oxyCODONE (OXY IR/ROXICODONE) 5 MG immediate release tablet Take 1 tablet (5 mg total) by mouth every 6 (six) hours as needed for severe pain. 8 tablet 0   rosuvastatin (CRESTOR) 5 MG tablet Take 5 mg by mouth every Tuesday.     RYBELSUS 7 MG TABS Take 7 mg by mouth every morning.     No current facility-administered medications for this visit.     REVIEW OF SYSTEMS: On review of systems, the patient reports that she is doing well since surgery but has had some cording of her right arm, and some fullness in  her right axilla. She's wearing a compressive bra and working with PT regarding her symptoms. No other complaints are verbalized.      PHYSICAL EXAM:  Wt Readings from Last 3 Encounters:  08/29/21 228 lb 12.8 oz (103.8 kg)  08/17/21 227 lb (103 kg)  08/12/21 227 lb (103 kg)   Temp Readings from Last 3 Encounters:  08/29/21 97.8 F (36.6 C) (Temporal)  08/17/21 97.6 F (36.4 C)  08/12/21 98.7 F (37.1 C)   BP Readings from Last 3 Encounters:  08/29/21 (!) 146/71  08/17/21 (!) 148/92  08/12/21 137/88   Pulse Readings from Last 3 Encounters:  08/29/21 (!) 101  08/17/21 87  08/12/21 89    In general this is a well appearing African American female in no acute distress. She's alert and oriented x4 and appropriate throughout the examination. Cardiopulmonary assessment is negative for acute distress and she exhibits normal effort.  Her right breast and axillary incision site are both well-healed without erythema, separation, or drainage. There is mild induration deep to both sites.     ECOG = 0  0 - Asymptomatic (Fully active, able to carry on all predisease activities without restriction)  1 - Symptomatic but completely ambulatory (Restricted in physically strenuous activity but ambulatory and able to carry out work of a light  or sedentary nature. For example, light housework, office work)  2 - Symptomatic, <50% in bed during the day (Ambulatory and capable of all self care but unable to carry out any work activities. Up and about more than 50% of waking hours)  3 - Symptomatic, >50% in bed, but not bedbound (Capable of only limited self-care, confined to bed or chair 50% or more of waking hours)  4 - Bedbound (Completely disabled. Cannot carry on any self-care. Totally confined to bed or chair)  5 - Death   Eustace Pen MM, Creech RH, Tormey DC, et al. [phone]). "Toxicity and response criteria of the [facility]". [place] Oncol. 5 (6): 649-55    LABORATORY DATA:  Lab Results  Component Value Date   WBC 7.2 08/12/2021   HGB 11.8 (L) 08/12/2021   HCT 38.8 08/12/2021   MCV 90.0 08/12/2021   PLT 233 08/12/2021   Lab Results  Component Value Date   NA 135 08/12/2021   K 3.9 08/12/2021   CL 104 08/12/2021   CO2 25 08/12/2021   Lab Results  Component Value Date   ALT 19 07/08/2021   AST 19 07/08/2021   ALKPHOS 76 07/08/2021   BILITOT 0.3 07/08/2021      RADIOGRAPHY: NM Sentinel Node Inj-No Rpt (Breast)  Result Date: 08/17/2021 Sulfur Colloid was injected by the Nuclear Medicine Technologist for sentinel lymph node localization.   MM Breast Surgical Specimen  Result Date: 08/17/2021 CLINICAL DATA:  Biopsy-proven metastatic RIGHT axillary lymph node (in addition to RIGHT breast malignancy and LEFT breast ALH). Radioactive seed localization was performed yesterday in anticipation of today's targeted axillary node excision. EXAM: SPECIMEN RADIOGRAPH OF THE RIGHT AXILLA COMPARISON:  Previous exam(s). FINDINGS: Status post excision of the RIGHT axilla. The radioactive seed and the Tribell biopsy marker clip are present in the non-compressed specimen. The radioactive seed is intact. This was discussed directly with the operating room nurse at the time of interpretation on 08/17/2021 at 2:50  p.m. IMPRESSION: Specimen radiograph of the RIGHT axilla. Electronically Signed   By: Evangeline Dakin M.D.   On: 08/17/2021 14:53  MM Breast Surgical Specimen  Result Date:  08/17/2021 CLINICAL DATA:  Evaluate specimen EXAM: SPECIMEN RADIOGRAPH OF THE RIGHT BREAST COMPARISON:  Previous exam(s). FINDINGS: Status post excision of the right breast. The radioactive seed and biopsy marker clip are present, completely intact, and were marked for pathology. IMPRESSION: Specimen radiograph of the right breast. Electronically Signed   By: Dorise Bullion III M.D.   On: 08/17/2021 14:36  MM Breast Surgical Specimen  Result Date: 08/17/2021 CLINICAL DATA:  Evaluate specimen EXAM: SPECIMEN RADIOGRAPH OF THE LEFT BREAST COMPARISON:  Previous exam(s). FINDINGS: Status post excision of the left breast. The radioactive seed and biopsy marker clip are present, completely intact, and were marked for pathology. IMPRESSION: Specimen radiograph of the left breast. Electronically Signed   By: Dorise Bullion III M.D.   On: 08/17/2021 14:11  MM LT RADIOACTIVE SEED LOC MAMMO GUIDE  Result Date: 08/16/2021 CLINICAL DATA:  Patient presents for radioactive seed localization right breast malignancy, a metastatic right axillary lymph node, and an area atypical lobular hyperplasia in the left breast. EXAM: MAMMOGRAPHIC GUIDED RADIOACTIVE SEED LOCALIZATION OF THE RIGHT BREAST MAMMOGRAPHIC GUIDED RADIOACTIVE SEED LOCALIZATION OF THE LEFT BREAST ULTRASOUND GUIDED RADIOACTIVE SEED LOCALIZATION OF THE RIGHT AXILLA COMPARISON:  Previous exam(s). FINDINGS: Patient presents for radioactive seed localization prior to surgical excision. I met with the patient and we discussed the procedure of seed localization including benefits and alternatives. We discussed the high likelihood of a successful procedure. We discussed the risks of the procedure including infection, bleeding, tissue injury and further surgery. We discussed the low dose of  radioactivity involved in the procedure. Informed, written consent was given. Seed #1: Ribbon Clip, right breast.  IMC. The usual time-out protocol was performed immediately prior to the procedure. Using mammographic guidance, sterile technique, 1% lidocaine and an I-125 radioactive seed, the ribbon shaped biopsy clip was localized using a superior approach. The follow-up mammogram images confirm the seed in the expected location and were marked for Dr. Barry Dienes. Follow-up survey of the patient confirms presence of the radioactive seed. Order number of I-125 seed:  [id number]. Total activity:  8.416 millicuries reference Date: 08/01/2021. Seed #2: Dumbell Clip, left breast, ALH. The usual time-out protocol was performed immediately prior to the procedure. Using mammographic guidance, sterile technique, 1% lidocaine and an I-125 radioactive seed, the dumbbell shaped biopsy marking clip was localized using a superior approach. The follow-up mammogram images confirm the seed in the expected location and were marked for Dr. Barry Dienes. Follow-up survey of the patient confirms presence of the radioactive seed. Order number of I-125 seed:  [id number]. Total activity:  0.932 millicuries reference Date: 08/01/2021. Seed #3: Tribell Clip, right axillary metastatic lymph node. The usual time-out protocol was performed immediately prior to the procedure. Using ultrasound guidance, sterile technique, 1% lidocaine and an I-125 radioactive seed, the right axillary lymph node containing the Tribell biopsy clip was localized using an inferior approach. Follow-up survey of the patient confirms presence of the radioactive seed. Order number of I-125 seed:  [id number]. Total activity:  5.427 millicuries reference Date: 08/01/2021 The patient tolerated the procedure well and was released from the [facility]. She was given instructions regarding seed removal. IMPRESSION: Radioactive seed localization right breast. Radioactive seed  localization left breast. Radioactive seed localization right axilla. No apparent complications. Electronically Signed   By: Lajean Manes M.D.   On: 08/16/2021 10:26  MM RT RADIOACTIVE SEED LOC MAMMO GUIDE  Result Date: 08/16/2021 CLINICAL DATA:  Patient presents for radioactive seed localization right breast malignancy, a metastatic right  axillary lymph node, and an area atypical lobular hyperplasia in the left breast. EXAM: MAMMOGRAPHIC GUIDED RADIOACTIVE SEED LOCALIZATION OF THE RIGHT BREAST MAMMOGRAPHIC GUIDED RADIOACTIVE SEED LOCALIZATION OF THE LEFT BREAST ULTRASOUND GUIDED RADIOACTIVE SEED LOCALIZATION OF THE RIGHT AXILLA COMPARISON:  Previous exam(s). FINDINGS: Patient presents for radioactive seed localization prior to surgical excision. I met with the patient and we discussed the procedure of seed localization including benefits and alternatives. We discussed the high likelihood of a successful procedure. We discussed the risks of the procedure including infection, bleeding, tissue injury and further surgery. We discussed the low dose of radioactivity involved in the procedure. Informed, written consent was given. Seed #1: Ribbon Clip, right breast.  IMC. The usual time-out protocol was performed immediately prior to the procedure. Using mammographic guidance, sterile technique, 1% lidocaine and an I-125 radioactive seed, the ribbon shaped biopsy clip was localized using a superior approach. The follow-up mammogram images confirm the seed in the expected location and were marked for Dr. Barry Dienes. Follow-up survey of the patient confirms presence of the radioactive seed. Order number of I-125 seed:  [id number]. Total activity:  6.270 millicuries reference Date: 08/01/2021. Seed #2: Dumbell Clip, left breast, ALH. The usual time-out protocol was performed immediately prior to the procedure. Using mammographic guidance, sterile technique, 1% lidocaine and an I-125 radioactive seed, the dumbbell shaped  biopsy marking clip was localized using a superior approach. The follow-up mammogram images confirm the seed in the expected location and were marked for Dr. Barry Dienes. Follow-up survey of the patient confirms presence of the radioactive seed. Order number of I-125 seed:  [id number]. Total activity:  2.993 millicuries reference Date: 08/01/2021. Seed #3: Tribell Clip, right axillary metastatic lymph node. The usual time-out protocol was performed immediately prior to the procedure. Using ultrasound guidance, sterile technique, 1% lidocaine and an I-125 radioactive seed, the right axillary lymph node containing the Tribell biopsy clip was localized using an inferior approach. Follow-up survey of the patient confirms presence of the radioactive seed. Order number of I-125 seed:  [id number]. Total activity:  8.101 millicuries reference Date: 08/01/2021 The patient tolerated the procedure well and was released from the [place]. She was given instructions regarding seed removal. IMPRESSION: Radioactive seed localization right breast. Radioactive seed localization left breast. Radioactive seed localization right axilla. No apparent complications. Electronically Signed   By: Lajean Manes M.D.   On: 08/16/2021 10:26  [place] RT RADIOACTIVE SEED LOC  Result Date: 08/16/2021 CLINICAL DATA:  Patient presents for radioactive seed localization right breast malignancy, a metastatic right axillary lymph node, and an area atypical lobular hyperplasia in the left breast. EXAM: MAMMOGRAPHIC GUIDED RADIOACTIVE SEED LOCALIZATION OF THE RIGHT BREAST MAMMOGRAPHIC GUIDED RADIOACTIVE SEED LOCALIZATION OF THE LEFT BREAST ULTRASOUND GUIDED RADIOACTIVE SEED LOCALIZATION OF THE RIGHT AXILLA COMPARISON:  Previous exam(s). FINDINGS: Patient presents for radioactive seed localization prior to surgical excision. I met with the patient and we discussed the procedure of seed localization including benefits and alternatives. We discussed the high  likelihood of a successful procedure. We discussed the risks of the procedure including infection, bleeding, tissue injury and further surgery. We discussed the low dose of radioactivity involved in the procedure. Informed, written consent was given. Seed #1: Ribbon Clip, right breast.  IMC. The usual time-out protocol was performed immediately prior to the procedure. Using mammographic guidance, sterile technique, 1% lidocaine and an I-125 radioactive seed, the ribbon shaped biopsy clip was localized using a superior approach. The follow-up mammogram images confirm the seed in  the expected location and were marked for Dr. Barry Dienes. Follow-up survey of the patient confirms presence of the radioactive seed. Order number of I-125 seed:  [id number]. Total activity:  6.301 millicuries reference Date: 08/01/2021. Seed #2: Dumbell Clip, left breast, ALH. The usual time-out protocol was performed immediately prior to the procedure. Using mammographic guidance, sterile technique, 1% lidocaine and an I-125 radioactive seed, the dumbbell shaped biopsy marking clip was localized using a superior approach. The follow-up mammogram images confirm the seed in the expected location and were marked for Dr. Barry Dienes. Follow-up survey of the patient confirms presence of the radioactive seed. Order number of I-125 seed:  [id number]. Total activity:  5.732 millicuries reference Date: 08/01/2021. Seed #3: Tribell Clip, right axillary metastatic lymph node. The usual time-out protocol was performed immediately prior to the procedure. Using ultrasound guidance, sterile technique, 1% lidocaine and an I-125 radioactive seed, the right axillary lymph node containing the Tribell biopsy clip was localized using an inferior approach. Follow-up survey of the patient confirms presence of the radioactive seed. Order number of I-125 seed:  [id number]. Total activity:  2.376 millicuries reference Date: 08/01/2021 The patient tolerated the procedure well and  was released from the [facility]. She was given instructions regarding seed removal. IMPRESSION: Radioactive seed localization right breast. Radioactive seed localization left breast. Radioactive seed localization right axilla. No apparent complications. Electronically Signed   By: Lajean Manes M.D.   On: 08/16/2021 10:26      IMPRESSION/PLAN: 1. 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 Dr. Lisbeth Renshaw has reviewed the patient's pathology, and course to date.  Given her initial diagnosis with node positive disease, she is offered external radiotherapy to the breast  and regional nodes to reduce risks of local recurrence followed by antiestrogen therapy. We discussed the risks, benefits, short, and long term effects of radiotherapy, as well as the curative intent, and the patient is interested in proceeding. I reviewed the the delivery and logistics of radiotherapy and Dr. Lisbeth Renshaw recommends 6 1/2 weeks of radiotherapy to the right breast and regional nodes. Written consent is obtained and placed in the chart, a copy was provided to the patient. She will simulate today. 2. Contraceptive Counseling. She has had tubal ligation and does not need pregnancy testing prior to proceeding with radiotherapy treatment or planning.  In a visit lasting 45 minutes, greater than 50% of the time was spent face to face reviewing her case, as well as in preparation of, discussing, and coordinating the patient's care.      Carola Rhine, [facility]    **Disclaimer: This note was dictated with voice recognition software. Similar sounding words can inadvertently be transcribed and this note may contain transcription errors which may not have been corrected upon publication of note.**

## 2021-09-13 ENCOUNTER — Ambulatory Visit: Payer: BC Managed Care – PPO

## 2021-09-13 ENCOUNTER — Ambulatory Visit
Admission: RE | Admit: 2021-09-13 | Discharge: 2021-09-13 | Disposition: A | Discharge status: Home / Self Care | Payer: BC Managed Care – PPO | Source: Ambulatory Visit | Attending: Radiation Oncology | Admitting: Radiation Oncology

## 2021-09-13 ENCOUNTER — Encounter: Payer: Self-pay | Admitting: Radiation Oncology

## 2021-09-13 ENCOUNTER — Encounter: Payer: Self-pay | Admitting: Licensed Clinical Social Worker

## 2021-09-13 ENCOUNTER — Other Ambulatory Visit: Payer: Self-pay

## 2021-09-13 VITALS — BP 132/89 | HR 90 | Temp 97.6°F | Resp 20 | Ht 64.0 in | Wt 229.4 lb

## 2021-09-13 DIAGNOSIS — Z7982 Long term (current) use of aspirin: Secondary | ICD-10-CM | POA: Insufficient documentation

## 2021-09-13 DIAGNOSIS — Z87891 Personal history of nicotine dependence: Secondary | ICD-10-CM | POA: Diagnosis not present

## 2021-09-13 DIAGNOSIS — R293 Abnormal posture: Secondary | ICD-10-CM

## 2021-09-13 DIAGNOSIS — Z803 Family history of malignant neoplasm of breast: Secondary | ICD-10-CM | POA: Diagnosis not present

## 2021-09-13 DIAGNOSIS — C50411 Malignant neoplasm of upper-outer quadrant of right female breast: Secondary | ICD-10-CM

## 2021-09-13 DIAGNOSIS — E119 Type 2 diabetes mellitus without complications: Secondary | ICD-10-CM | POA: Insufficient documentation

## 2021-09-13 DIAGNOSIS — Z79899 Other long term (current) drug therapy: Secondary | ICD-10-CM | POA: Diagnosis not present

## 2021-09-13 DIAGNOSIS — Z801 Family history of malignant neoplasm of trachea, bronchus and lung: Secondary | ICD-10-CM | POA: Insufficient documentation

## 2021-09-13 DIAGNOSIS — R600 Localized edema: Secondary | ICD-10-CM

## 2021-09-13 DIAGNOSIS — Z800 Family history of malignant neoplasm of digestive organs: Secondary | ICD-10-CM | POA: Insufficient documentation

## 2021-09-13 DIAGNOSIS — Z170 Estrogen receptor positive status [ER+]: Secondary | ICD-10-CM | POA: Insufficient documentation

## 2021-09-13 DIAGNOSIS — M25611 Stiffness of right shoulder, not elsewhere classified: Secondary | ICD-10-CM

## 2021-09-13 NOTE — Progress Notes (Signed)
Spoke w/ patient, verified identity, and began nursing interview. Patient is doing well and denies any breast discomfort at this time.  Meaningful use complete. Patient states "NO chances of pregnancy."  BP 132/89 (BP Location: Left Arm, Patient Position: Sitting, Cuff Size: Large)    Pulse 90    Temp 97.6 F (36.4 C) (Temporal)    Resp 20    Ht 5\' 4"  (1.626 m)    Wt 229 lb 6.4 oz (104.1 kg)    SpO2 99%    BMI 39.38 kg/m

## 2021-09-13 NOTE — Progress Notes (Signed)
[facility] CSW Progress Note  Patient dropped off final information for [employer] application. CSW submitted today via mail.  Patient also notified CSW that she did receive the grant from [facility]. She has been approved for [place] [facility] [place] and will bring in bills for CSW to assist with submitting for reimbursement.     Christeen Douglas , LCSW

## 2021-09-13 NOTE — Therapy (Signed)
[facility] @ [place], [place], [address] Phone: [phone]   Fax:  [phone]  Physical Therapy Treatment  Patient Details  Name: Sandra Bishop MRN: [id number] Date of Birth: [birth year] Referring Provider (PT): Dr. Lindi Adie   Encounter Date: 09/13/2021   PT End of Session - 09/13/21 1451     Visit Number 3    Number of Visits 14    Date for PT Re-Evaluation 10/19/21    PT Start Time 6546    PT Stop Time 1450    PT Time Calculation (min) 48 min    Activity Tolerance Patient tolerated treatment well    Behavior During Therapy [facility] for tasks assessed/performed             Past Medical History:  Diagnosis Date   Allergy    Anemia    Breast cancer ([place])    Right   Constipation    Diabetes mellitus without complication ([place])    Family history of adverse reaction to anesthesia    Aunt is hard to wake up from general anesthesia   Family history of breast cancer    Family history of colon cancer    Family history of lung cancer    Family history of pancreatic cancer    History of migraine    during pregancy   Obesity    Palpitations     Past Surgical History:  Procedure Laterality Date   BREAST BIOPSY Right 02/11/2021   x2   BREAST BIOPSY Right 03/22/2021   x3   BREAST LUMPECTOMY WITH RADIOACTIVE SEED AND SENTINEL LYMPH NODE BIOPSY Right 08/17/2021   Procedure: RIGHT BREAST LUMPECTOMY WITH RADIOACTIVE SEED AND SENTINEL LYMPH NODE BIOPSY;  Surgeon: [name], MD;  Location: [facility];  Service: General;  Laterality: Right;   EYE SURGERY Right    PORT-A-CATH REMOVAL N/A 08/17/2021   Procedure: REMOVAL PORT-A-CATH;  Surgeon: [name], MD;  Location: [place] [facility];  Service: General;  Laterality: N/A;   PORTACATH PLACEMENT N/A 03/02/2021   Procedure: INSERTION PORT-A-CATH;  Surgeon: [name], MD;  Location[facility];  Service: General;  Laterality: N/A;   RADIOACTIVE SEED GUIDED AXILLARY SENTINEL  LYMPH NODE Right 08/17/2021   Procedure: RADIOACTIVE SEED GUIDED RIGHT SENTINEL LYMPH NODE EXCISION;  Surgeon: [name], MD;  Location: [facility];  Service: General;  Laterality: Right;   RADIOACTIVE SEED GUIDED EXCISIONAL BREAST BIOPSY Left 08/17/2021   Procedure: RADIOACTIVE SEED GUIDED EXCISIONAL LEFT BREAST BIOPSY;  Surgeon: [name], MD;  Location: [facility];  Service: General;  Laterality: Left;   TUBAL LIGATION      There were no vitals filed for this visit.   Subjective Assessment - 09/13/21 1402     Subjective I went to CT simulation today and they molded me.  I start radiation March 7.  My arm pain is much better but the cording is still bothering me at the elbow and above the elbow but not in the axilla. The Dr touched me yesterday and after that my arm really hurt. I got a compression bra and it feels really good.  My breast is still swollen, but I have not worn the bra at night    Pertinent History Pts.Screening mammogram determined  mass in the right breast  Diagnostic mammogram and [place]: a persistent irregular mass 1.2 cm within the upper-outer right breast. 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  Pt has finished neoadjuvant chemo and had right Lumpectomy with SLNB on 08/17/2021 with 0/3 LN's. She will have radiation and anti-estrogens    Patient Stated Goals Post op reassessment;Decrease arm pain, improve ROM    Currently in Pain? Yes    Pain Score 4     Pain Location Elbow    Pain Orientation Right    Pain Descriptors / Indicators Aching;Burning    Pain Type Surgical pain    Pain Onset More than a month ago    Pain Frequency Constant    Aggravating Factors  reaching,                               [facility] Adult PT Treatment/Exercise - 09/13/21 0001       Shoulder Exercises: Supine   Other Supine Exercises supine AROM flexion, scaption, horizontal abd bilaterally x 5      Manual  Therapy   Edema Management pt got compression bra    Soft tissue mobilization to right pecs, lats and UT with cocoa butter    Myofascial Release gentle MFR at upper arm,  antecubital fossa  and forearm area of cording    Passive ROM PROM right shoulder flexion, scaption , D2 flex, ER                          PT Long Term Goals - 09/07/21 1212       PT LONG TERM GOAL #1   Title Pt will regain right shoulder AROM within 5-10 degrees of baseline for ability to peform home and work activities    Time 6    Period Weeks    Status New    Target Date 10/19/21      PT LONG TERM GOAL #2   Title Pain and tightness from cording decreased atleast 50%    Time 6    Period Weeks    Status New    Target Date 10/19/21      PT LONG TERM GOAL #3   Title Pt will attend ABC and will have a good understanding of skin care and precautions for lymphedema    Time 6    Period Weeks    Status New    Target Date 10/19/21      PT LONG TERM GOAL #4   Title Pt will  purchase compression bra and will have 50% reduction in edema    Time 6    Period Weeks    Status New    Target Date 10/19/21      PT LONG TERM GOAL #5   Title Pt will be educated and compliant with MLD as need for right breast swelling    Time 6    Period Weeks    Target Date 10/19/21                   Plan - 09/13/21 1412     Clinical Impression Statement Continued MFR techniques to large cord in upper arm, antecubital fossa, and forearm, Soft tissue mobilization to right pecs, lats, UT, and PROM to right shoulder.  Pt felt much better after MFR techniques and was able to reach higher overhead.    Personal Factors and Comorbidities Comorbidity 2    Comorbidities Breast Cancer with 3 LN removed, chemo, pending radiation, DM    Examination-Activity Limitations Reach Overhead;Dressing;Lift    Examination-Participation Restrictions Occupation  Stability/Clinical Decision Making Stable/Uncomplicated    Rehab  Potential Excellent    PT Frequency 2x / week    PT Duration 6 weeks    PT Treatment/Interventions Therapeutic exercise;Patient/family education;Orthotic Fit/Training;Manual techniques;Passive range of motion;Scar mobilization;Manual lymph drainage;Vasopneumatic Device    PT Next Visit Plan MFR to cording, PROM, scar mobilization to SLNB scar, ?pulleys, , MLD prn left breast.    Consulted and Agree with Plan of Care Patient             Patient will benefit from skilled therapeutic intervention in order to improve the following deficits and impairments:  Decreased range of motion, Increased fascial restricitons, Impaired UE functional use, Decreased activity tolerance, Decreased knowledge of precautions, Pain, Decreased scar mobility, Impaired flexibility, Decreased strength, Increased edema, Postural dysfunction  Visit Diagnosis: Malignant neoplasm of upper-outer quadrant of right breast in female, estrogen receptor positive ([facility])  Abnormal posture  Stiffness of right shoulder, not elsewhere classified  Localized edema     Problem List Patient Active Problem List   Diagnosis Date Noted   Hyperglycemia due to type 2 diabetes mellitus ([place]) 07/08/2021   Pure hypercholesterolemia 07/08/2021   Morbid obesity ([place]) 07/08/2021   Chemotherapy-induced peripheral neuropathy ([place]) 07/08/2021   Genetic testing 03/17/2021   Port-A-Cath in place 03/03/2021   Family history of breast cancer 02/24/2021   Family history of pancreatic cancer 02/24/2021   Family history of colon cancer 02/24/2021   Family history of lung cancer 02/24/2021   Malignant neoplasm of upper-outer quadrant of right breast in female, estrogen receptor positive ([place]) 02/15/2021   Obesity    Constipation 08/30/2011    Claris Pong, PT 09/13/2021, 2:54 PM  [facility] @ [place], [place], [address] Phone: [phone]   Fax:   [phone]  Name: SHAKIRAH KIRKEY MRN: [id number] Date of Birth: 12/07/1970

## 2021-09-19 DIAGNOSIS — C50411 Malignant neoplasm of upper-outer quadrant of right female breast: Secondary | ICD-10-CM | POA: Insufficient documentation

## 2021-09-19 DIAGNOSIS — Z170 Estrogen receptor positive status [ER+]: Secondary | ICD-10-CM | POA: Diagnosis present

## 2021-09-20 ENCOUNTER — Ambulatory Visit
Admission: RE | Admit: 2021-09-20 | Discharge: 2021-09-20 | Disposition: A | Discharge status: Home / Self Care | Payer: BC Managed Care – PPO | Source: Ambulatory Visit | Attending: Radiation Oncology | Admitting: Radiation Oncology

## 2021-09-20 ENCOUNTER — Ambulatory Visit: Payer: BC Managed Care – PPO | Attending: Hematology and Oncology

## 2021-09-20 ENCOUNTER — Other Ambulatory Visit: Payer: Self-pay

## 2021-09-20 DIAGNOSIS — M25611 Stiffness of right shoulder, not elsewhere classified: Secondary | ICD-10-CM | POA: Insufficient documentation

## 2021-09-20 DIAGNOSIS — R600 Localized edema: Secondary | ICD-10-CM | POA: Diagnosis present

## 2021-09-20 DIAGNOSIS — R293 Abnormal posture: Secondary | ICD-10-CM | POA: Diagnosis present

## 2021-09-20 DIAGNOSIS — C50411 Malignant neoplasm of upper-outer quadrant of right female breast: Secondary | ICD-10-CM | POA: Insufficient documentation

## 2021-09-20 DIAGNOSIS — Z170 Estrogen receptor positive status [ER+]: Secondary | ICD-10-CM | POA: Insufficient documentation

## 2021-09-20 NOTE — Therapy (Signed)
[facility] ?[facility] @ [place] ?[place], [place], [address] ?Phone: [phone]   Fax:  [phone] ? ?Physical Therapy Treatment ? ?Patient Details  ?Name: Sandra Bishop ?MRN: [id number] ?Date of Birth: 10-29-1970 ?Referring Provider (PT): Dr. Lindi Adie ? ? ?Encounter Date: 09/20/2021 ? ? PT End of Session - 09/20/21 1000   ? ? Visit Number 4   ? Number of Visits 14   ? Date for PT Re-Evaluation 10/19/21   ? PT Start Time 1001   ? PT Stop Time 1054   ? PT Time Calculation (min) 53 min   ? Activity Tolerance Patient tolerated treatment well   ? Behavior During Therapy [facility] for tasks assessed/performed   ? ?  ?  ? ?  ? ? ?Past Medical History:  ?Diagnosis Date  ? Allergy   ? Anemia   ? Breast cancer ([place])   ? Right  ? Constipation   ? Diabetes mellitus without complication ([place])   ? Family history of adverse reaction to anesthesia   ? Aunt is hard to wake up from general anesthesia  ? Family history of breast cancer   ? Family history of colon cancer   ? Family history of lung cancer   ? Family history of pancreatic cancer   ? History of migraine   ? during pregancy  ? Obesity   ? Palpitations   ? ? ?Past Surgical History:  ?Procedure Laterality Date  ? BREAST BIOPSY Right 02/11/2021  ? x2  ? BREAST BIOPSY Right 03/22/2021  ? x3  ? BREAST LUMPECTOMY WITH RADIOACTIVE SEED AND SENTINEL LYMPH NODE BIOPSY Right 08/17/2021  ? Procedure: RIGHT BREAST LUMPECTOMY WITH RADIOACTIVE SEED AND SENTINEL LYMPH NODE BIOPSY;  Surgeon: [name], MD;  Location: [facility];  Service: General;  Laterality: Right;  ? EYE SURGERY Right   ? PORT-A-CATH REMOVAL N/A 08/17/2021  ? Procedure: REMOVAL PORT-A-CATH;  Surgeon: [name], MD;  Location: [facility];  Service: General;  Laterality: N/A;  ? PORTACATH PLACEMENT N/A 03/02/2021  ? Procedure: INSERTION PORT-A-CATH;  Surgeon: [name], MD;  [facility];  Service: General;  Laterality: N/A;  ? RADIOACTIVE SEED GUIDED AXILLARY SENTINEL  LYMPH NODE Right 08/17/2021  ? Procedure: RADIOACTIVE SEED GUIDED RIGHT SENTINEL LYMPH NODE EXCISION;  Surgeon: [name], MD;  Location: [facility];  Service: General;  Laterality: Right;  ? RADIOACTIVE SEED GUIDED EXCISIONAL BREAST BIOPSY Left 08/17/2021  ? Procedure: RADIOACTIVE SEED GUIDED EXCISIONAL LEFT BREAST BIOPSY;  Surgeon: [name], MD;  Location: [facility];  Service: General;  Laterality: Left;  ? TUBAL LIGATION    ? ? ?There were no vitals filed for this visit. ? ? Subjective Assessment - 09/20/21 1000   ? ? Subjective I had a big pop in my arm on Sunday and my arm is really sore still.   I have shooting pain in my breast and it might be swollen.  I slept with the compression bra on. I have radiation at 1:00 today.   ? Pertinent History Pts.Screening mammogram determined  mass in the right breast  Diagnostic mammogram and [place]: a persistent irregular mass 1.2 cm within the upper-outer right breast. 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 Pt has finished neoadjuvant chemo and had right Lumpectomy with SLNB on 08/17/2021 with 0/3 LN's. She will have radiation and anti-estrogens   ? ?  ?  ? ?  ? ? ? ? ? ? ? ? ? ? ? ? ? ? ? ? ? ? ? ?  [facility] Adult PT Treatment/Exercise - 09/20/21 0001   ? ?  ? Shoulder Exercises: Pulleys  ? Flexion 2 minutes   ? Flexion Limitations return demonstrated   ? Scaption 1 minute   ? Scaption Limitations return demonstrated;VC's for scapular hiking   ? ABduction 2 minutes   ? ABduction Limitations return demonstrated   ?  ? Shoulder Exercises: Therapy Ball  ? Flexion 10 reps   ? ABduction Limitations 5   ?  ? Manual Therapy  ? Edema Management small and medium TG soft cut for right arm to assist with cording   ? Soft tissue mobilization pt has radiation after this   ? Myofascial Release MFR at  axilla upper arm,  antecubital fossa  and forearm area of cording   ? Passive ROM PROM right shoulder flexion, scaption ,  D2 flex, ER   ? ?  ?  ? ?  ? ? ? ? ? ? ? ? ? ? ? ? ? ? ? PT Long Term Goals - 09/07/21 1212   ? ?  ? PT LONG TERM GOAL #1  ? Title Pt will regain right shoulder AROM within 5-10 degrees of baseline for ability to peform home and work activities   ? Time 6   ? Period Weeks   ? Status New   ? Target Date 10/19/21   ?  ? PT LONG TERM GOAL #2  ? Title Pain and tightness from cording decreased atleast 50%   ? Time 6   ? Period Weeks   ? Status New   ? Target Date 10/19/21   ?  ? PT LONG TERM GOAL #3  ? Title Pt will attend ABC and will have a good understanding of skin care and precautions for lymphedema   ? Time 6   ? Period Weeks   ? Status New   ? Target Date 10/19/21   ?  ? PT LONG TERM GOAL #4  ? Title Pt will  purchase compression bra and will have 50% reduction in edema   ? Time 6   ? Period Weeks   ? Status New   ? Target Date 10/19/21   ?  ? PT LONG TERM GOAL #5  ? Title Pt will be educated and compliant with MLD as need for right breast swelling   ? Time 6   ? Period Weeks   ? Target Date 10/19/21   ? ?  ?  ? ?  ? ? ? ? ? ? ? ? Plan - 09/20/21 1016   ? ? Clinical Impression Statement Pt continues to have discomfort and limitations from cording in the right UE. Had a big pop on Sunday and her arm has been sore since then. Gave pt a TG soft sleeve to see if that might make her arm more comfortable.  Pt performed pulleys and standing therapy ball exs.  Therapist continued MFR to the right UE, and showed pt how to do scar massage on her incisions which are restricted.  Her left breast is very swollen today. She was advised to wear her compression bra day and night and to check breast in the morning. She has been wearing a regular bra intermittently.  Will initiate right breast MLD next visit   ? Personal Factors and Comorbidities Comorbidity 2   ? Comorbidities Breast Cancer with 3 LN removed, chemo, pending radiation, DM   ? Examination-Participation Restrictions Occupation   ? Stability/Clinical Decision Making  Stable/Uncomplicated   ?  Rehab Potential Excellent   ? PT Frequency 2x / week   ? PT Duration 6 weeks   ? PT Treatment/Interventions Therapeutic exercise;Patient/family education;Orthotic Fit/Training;Manual techniques;Passive range of motion;Scar mobilization;Manual lymph drainage;Vasopneumatic Device   ? PT Next Visit Plan initiate MLD to right breast,MFR to cording, PROM, scar mobilization to SLNB scar, ?pulleys, ,   ? PT Home Exercise Plan 4 post op exercises, wall chest stretch with wrist oscillations   ? Consulted and Agree with Plan of Care Patient   ? ?  ?  ? ?  ? ? ?Patient will benefit from skilled therapeutic intervention in order to improve the following deficits and impairments:  Decreased range of motion, Increased fascial restricitons, Impaired UE functional use, Decreased activity tolerance, Decreased knowledge of precautions, Pain, Decreased scar mobility, Impaired flexibility, Decreased strength, Increased edema, Postural dysfunction ? ?Visit Diagnosis: ?Malignant neoplasm of upper-outer quadrant of right breast in female, estrogen receptor positive ([place]) ? ?Abnormal posture ? ?Stiffness of right shoulder, not elsewhere classified ? ?Localized edema ? ? ? ? ?Problem List ?Patient Active Problem List  ? Diagnosis Date Noted  ? Hyperglycemia due to type 2 diabetes mellitus ([place]) 07/08/2021  ? Pure hypercholesterolemia 07/08/2021  ? Morbid obesity ([place]) 07/08/2021  ? Chemotherapy-induced peripheral neuropathy ([place]) 07/08/2021  ? Genetic testing 03/17/2021  ? Port-A-Cath in place 03/03/2021  ? Family history of breast cancer 02/24/2021  ? Family history of pancreatic cancer 02/24/2021  ? Family history of colon cancer 02/24/2021  ? Family history of lung cancer 02/24/2021  ? Malignant neoplasm of upper-outer quadrant of right breast in female, estrogen receptor positive ([place]) 02/15/2021  ? Obesity   ? Constipation 08/30/2011  ? ? ?Claris Pong, PT ?09/20/2021, 12:04 PM ? ?[facility] ?[facility] @ [place] ?[place], [place], [address] ?Phone: [phone]   Fax:  [phone] ? ?Name: Sandra Bishop ?MRN: [id number] ?Date of Birth: 01-21-1971 ? ? ? ?

## 2021-09-21 ENCOUNTER — Ambulatory Visit
Admission: RE | Admit: 2021-09-21 | Discharge: 2021-09-21 | Disposition: A | Discharge status: Home / Self Care | Payer: BC Managed Care – PPO | Source: Ambulatory Visit | Attending: Radiation Oncology | Admitting: Radiation Oncology

## 2021-09-21 DIAGNOSIS — C50411 Malignant neoplasm of upper-outer quadrant of right female breast: Secondary | ICD-10-CM | POA: Diagnosis not present

## 2021-09-22 ENCOUNTER — Encounter: Payer: Self-pay | Admitting: *Deleted

## 2021-09-22 ENCOUNTER — Telehealth: Payer: Self-pay

## 2021-09-22 ENCOUNTER — Other Ambulatory Visit: Payer: Self-pay

## 2021-09-22 ENCOUNTER — Ambulatory Visit: Payer: BC Managed Care – PPO

## 2021-09-22 ENCOUNTER — Ambulatory Visit
Admission: RE | Admit: 2021-09-22 | Discharge: 2021-09-22 | Disposition: A | Discharge status: Home / Self Care | Payer: BC Managed Care – PPO | Source: Ambulatory Visit | Attending: Radiation Oncology | Admitting: Radiation Oncology

## 2021-09-22 DIAGNOSIS — C50411 Malignant neoplasm of upper-outer quadrant of right female breast: Secondary | ICD-10-CM | POA: Diagnosis not present

## 2021-09-22 DIAGNOSIS — R293 Abnormal posture: Secondary | ICD-10-CM

## 2021-09-22 DIAGNOSIS — Z170 Estrogen receptor positive status [ER+]: Secondary | ICD-10-CM

## 2021-09-22 DIAGNOSIS — R600 Localized edema: Secondary | ICD-10-CM

## 2021-09-22 DIAGNOSIS — M25611 Stiffness of right shoulder, not elsewhere classified: Secondary | ICD-10-CM

## 2021-09-22 NOTE — Patient Instructions (Addendum)
Self manual lymph drainage:      Cancer Rehab [phone] ?Perform this sequence once a day.  Only give enough pressure to your skin to make the skin move. ? ?Start with circles near neck, hands above collarbones. 5 times each side ?Diaphragmatic - Supine ? ? ?Inhale through nose making navel move out toward hands. Exhale through puckered lips, hands follow navel in. ?Repeat _5__ times. Rest _10__ seconds between repeats.  ? ?Axilla - One at a Time ? ? ?Using full weight of flat hand and fingers at center of uninvolved armpit, make _10__ in-place circles.  ? ?Copyright ? VHI. All rights reserved.  ?LEG: Inguinal Nodes Stimulation ? ? ?With small finger side of hand against hip crease on involved side, gently perform circles at the crease. ?Repeat __10_ times.  ? ?Copyright ? VHI. All rights reserved.  ?Axilla to Inguinal Nodes - Sweep ? ? ?On involved side, pump _4__ times from armpit along side of trunk to hip crease. ? ?Now gently stretch skin from the involved side to the uninvolved side across the chest at the shoulder line.  Repeat that 4 times. ? ?Draw an imaginary diagonal line from upper outer breast through the nipple area toward lower inner breast.  Direct fluid upward and inward from this line toward the pathway across your upper chest .  Do this in three rows to treat all of the upper inner breast tissue, and do each row 3-4x. ?     Direct fluid to treat all of lower outer breast tissue downward and outward toward  pathway that is aimed at the right groin. Do this pathway laying on your left side.  ? ?Finish laying on your back by doing the pathways as described above going from your involved armpit to the same side groin and going across your upper chest from the involved shoulder to the uninvolved shoulder. ? ?Repeat the steps above where you do circles in your right groin and left armpit. ?Copyright ? VHI. All rights reserved.   ?

## 2021-09-22 NOTE — Telephone Encounter (Signed)
Notified Patient of completion of [employer] Benefits Form 703. Copy of form placed in bin behind Registration desk for pick-up as requested by Patient. No other concerns or needs verbalized at this time. ?

## 2021-09-22 NOTE — Therapy (Signed)
[place] [facility] @ [address] [place], [place], [address] Phone: [phone]   Fax:  [phone]  Physical Therapy Treatment  Patient Details  Name: Sandra Bishop MRN: [id number] Date of Birth: 10-29-1970 Referring Provider (PT): Dr. Lindi Adie   Encounter Date: 09/22/2021   PT End of Session - 09/22/21 1242     Visit Number 5    Number of Visits 14    Date for PT Re-Evaluation 10/19/21    PT Start Time 1107    PT Stop Time 1205    PT Time Calculation (min) 58 min    Activity Tolerance Patient tolerated treatment well    Behavior During Therapy [facility] for tasks assessed/performed             Past Medical History:  Diagnosis Date   Allergy    Anemia    Breast cancer ([place])    Right   Constipation    Diabetes mellitus without complication ([place])    Family history of adverse reaction to anesthesia    Aunt is hard to wake up from general anesthesia   Family history of breast cancer    Family history of colon cancer    Family history of lung cancer    Family history of pancreatic cancer    History of migraine    during pregancy   Obesity    Palpitations     Past Surgical History:  Procedure Laterality Date   BREAST BIOPSY Right 02/11/2021   x2   BREAST BIOPSY Right 03/22/2021   x3   BREAST LUMPECTOMY WITH RADIOACTIVE SEED AND SENTINEL LYMPH NODE BIOPSY Right 08/17/2021   Procedure: RIGHT BREAST LUMPECTOMY WITH RADIOACTIVE SEED AND SENTINEL LYMPH NODE BIOPSY;  Surgeon: [name], MD;  Location: [place];  Service: General;  Laterality: Right;   EYE SURGERY Right    PORT-A-CATH REMOVAL N/A 08/17/2021   Procedure: REMOVAL PORT-A-CATH;  Surgeon: [name], MD;  Location: [place];  Service: General;  Laterality: N/A;   PORTACATH PLACEMENT N/A 03/02/2021   Procedure: INSERTION PORT-A-CATH;  Surgeon: [name], MD;  Location: [facility];  Service: General;  Laterality: N/A;   RADIOACTIVE SEED GUIDED AXILLARY SENTINEL  LYMPH NODE Right 08/17/2021   Procedure: RADIOACTIVE SEED GUIDED RIGHT SENTINEL LYMPH NODE EXCISION;  Surgeon: [name], MD;  Location: [place];  Service: General;  Laterality: Right;   RADIOACTIVE SEED GUIDED EXCISIONAL BREAST BIOPSY Left 08/17/2021   Procedure: RADIOACTIVE SEED GUIDED EXCISIONAL LEFT BREAST BIOPSY;  Surgeon: [name], MD;  Location: [place];  Service: General;  Laterality: Left;   TUBAL LIGATION      There were no vitals filed for this visit.   Subjective Assessment - 09/22/21 1117     Subjective I didn't wear my compression bra today so you could see how my breast is doing without it. The cording in my elbow is what bothers me the most.    Pertinent History Pts.Screening mammogram determined  mass in the right breast  Diagnostic mammogram and [place]: a persistent irregular mass 1.2 cm within the upper-outer right breast. 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 Pt has finished neoadjuvant chemo and had right Lumpectomy with SLNB on 08/17/2021 with 0/3 LN's. She will have radiation and anti-estrogens    Patient Stated Goals Post op reassessment;Decrease arm pain, improve ROM    Currently in Pain? Yes    Pain Score 5  Pain Location Elbow    Pain Orientation Right    Pain Descriptors / Indicators Aching;Burning    Pain Type Surgical pain    Pain Radiating Towards breast    Pain Onset More than a month ago    Pain Frequency Constant    Aggravating Factors  noticing breast pain more with increased swelling                               [facility] Adult PT Treatment/Exercise - 09/22/21 0001       Manual Therapy   Manual Therapy Myofascial release;Manual Lymphatic Drainage (MLD);Passive ROM    Myofascial Release MFR at areas of cording in axila and antecubital fossa    Manual Lymphatic Drainage (MLD) In Supine: Short neck, 5 diaphragmatic breaths, Lt axillary and Rt inguinal nodes,  anterior inter-axillayr and Rt axillo-inguinal anastomosis, then focused on Rt breast, then in Lt S/L to focus on lateral aspect of breast and finished retracing all steps in supine begining to instruct pt throughout and having her return demo of each sequence with tactile and VCs for light pressure and skin strtch only, no sliding. Also briefly performed MLD to upper arm for cording but did not include instruction to pt in this today.    Passive ROM PROM right shoulder flexion and abd in supine                     PT Education - 09/22/21 1239     Education Details Self MLD for Rt breast    Person(s) Educated Patient    Methods Explanation;Demonstration;Tactile cues;Verbal cues;Handout    Comprehension Verbalized understanding;Returned demonstration;Tactile cues required;Need further instruction                 PT Long Term Goals - 09/07/21 1212       PT LONG TERM GOAL #1   Title Pt will regain right shoulder AROM within 5-10 degrees of baseline for ability to peform home and work activities    Time 6    Period Weeks    Status New    Target Date 10/19/21      PT LONG TERM GOAL #2   Title Pain and tightness from cording decreased atleast 50%    Time 6    Period Weeks    Status New    Target Date 10/19/21      PT LONG TERM GOAL #3   Title Pt will attend [facility] and will have a good understanding of skin care and precautions for lymphedema    Time 6    Period Weeks    Status New    Target Date 10/19/21      PT LONG TERM GOAL #4   Title Pt will  purchase compression bra and will have 50% reduction in edema    Time 6    Period Weeks    Status New    Target Date 10/19/21      PT LONG TERM GOAL #5   Title Pt will be educated and compliant with MLD as need for right breast swelling    Time 6    Period Weeks    Target Date 10/19/21                   Plan - 09/22/21 1242     Clinical Impression Statement Began MLD to Rt breast. Pt did very well with  verbalizing and  demonstrating a good understanding of basic principles of sequence of MLD after instruction. Also instructed her in basics of anatomy of lymphatic system. Also continued MFR to cording at axilla and antecubital fossa where pt still demonstrates increased tenderness but some softening was noted by end of session and  she reports it feeling some looser.    Personal Factors and Comorbidities Comorbidity 2    Comorbidities Breast Cancer with 3 LN removed, chemo, pending radiation, DM    Examination-Activity Limitations Reach Overhead;Dressing;Lift    Examination-Participation Restrictions Occupation    Stability/Clinical Decision Making Stable/Uncomplicated    Rehab Potential Excellent    PT Frequency 2x / week    PT Duration 6 weeks    PT Treatment/Interventions Therapeutic exercise;Patient/family education;Orthotic Fit/Training;Manual techniques;Passive range of motion;Scar mobilization;Manual lymph drainage;Vasopneumatic Device    PT Next Visit Plan Cont MLD to right breast and review this with pt, MFR to cording, PROM, scar mobilization to SLNB scar, ?pulleys, ,    PT Home Exercise Plan 4 post op exercises, wall chest stretch with wrist oscillations; self MLD to Rt breast    Consulted and Agree with Plan of Care Patient             Patient will benefit from skilled therapeutic intervention in order to improve the following deficits and impairments:  Decreased range of motion, Increased fascial restricitons, Impaired UE functional use, Decreased activity tolerance, Decreased knowledge of precautions, Pain, Decreased scar mobility, Impaired flexibility, Decreased strength, Increased edema, Postural dysfunction  Visit Diagnosis: Malignant neoplasm of upper-outer quadrant of right breast in female, estrogen receptor positive (HCC)  Abnormal posture  Stiffness of right shoulder, not elsewhere classified  Localized edema     Problem List Patient Active Problem List    Diagnosis Date Noted   Hyperglycemia due to type 2 diabetes mellitus ([place]) 07/08/2021   Pure hypercholesterolemia 07/08/2021   Morbid obesity ([place]) 07/08/2021   Chemotherapy-induced peripheral neuropathy ([place]) 07/08/2021   Genetic testing 03/17/2021   Port-A-Cath in place 03/03/2021   Family history of breast cancer 02/24/2021   Family history of pancreatic cancer 02/24/2021   Family history of colon cancer 02/24/2021   Family history of lung cancer 02/24/2021   Malignant neoplasm of upper-outer quadrant of right breast in female, estrogen receptor positive ([place]) 02/15/2021   Obesity    Constipation 08/30/2011    Otelia Limes, PTA 09/22/2021, 12:45 PM  [place] @ [place] [address], [place], [address] Phone: [phone]   Fax:  [phone]  Name: Sandra Bishop MRN: [id number] Date of Birth: Jan 22, 1971

## 2021-09-23 ENCOUNTER — Ambulatory Visit: Payer: BC Managed Care – PPO | Admitting: Radiation Oncology

## 2021-09-23 ENCOUNTER — Ambulatory Visit
Admission: RE | Admit: 2021-09-23 | Discharge: 2021-09-23 | Disposition: A | Discharge status: Home / Self Care | Payer: BC Managed Care – PPO | Source: Ambulatory Visit | Attending: Radiation Oncology | Admitting: Radiation Oncology

## 2021-09-23 DIAGNOSIS — C50411 Malignant neoplasm of upper-outer quadrant of right female breast: Secondary | ICD-10-CM

## 2021-09-23 MED ORDER — RADIAPLEXRX EX GEL: Freq: Once | CUTANEOUS | Status: AC

## 2021-09-23 NOTE — Progress Notes (Signed)
Pt here for patient teaching.  Pt given Radiation and You booklet, skin care instructions, and Radiaplex.  Patient was advised to obtain over the counter aluminum free deodorant.  Reviewed areas of pertinence such as fatigue, hair loss, skin changes, breast tenderness, and breast swelling. Pt able to give teach back of to pat skin and use unscented/gentle soap,apply Radiaplex bid, avoid applying anything to skin within 4 hours of treatment, avoid wearing an under wire bra, and to use an electric razor if they must shave. Pt verbalizes understanding of information given and will contact nursing with any questions or concerns.   ? ? ?Gloriajean Dell. Leonie Green, BSN  ?

## 2021-09-26 ENCOUNTER — Ambulatory Visit
Admission: RE | Admit: 2021-09-26 | Discharge: 2021-09-26 | Disposition: A | Discharge status: Home / Self Care | Payer: BC Managed Care – PPO | Source: Ambulatory Visit | Attending: Radiation Oncology | Admitting: Radiation Oncology

## 2021-09-26 ENCOUNTER — Other Ambulatory Visit: Payer: Self-pay

## 2021-09-26 DIAGNOSIS — C50411 Malignant neoplasm of upper-outer quadrant of right female breast: Secondary | ICD-10-CM | POA: Diagnosis not present

## 2021-09-27 ENCOUNTER — Ambulatory Visit
Admission: RE | Admit: 2021-09-27 | Discharge: 2021-09-27 | Disposition: A | Discharge status: Home / Self Care | Payer: BC Managed Care – PPO | Source: Ambulatory Visit | Attending: Radiation Oncology | Admitting: Radiation Oncology

## 2021-09-27 ENCOUNTER — Ambulatory Visit: Payer: BC Managed Care – PPO

## 2021-09-27 DIAGNOSIS — C50411 Malignant neoplasm of upper-outer quadrant of right female breast: Secondary | ICD-10-CM | POA: Diagnosis not present

## 2021-09-27 DIAGNOSIS — R600 Localized edema: Secondary | ICD-10-CM

## 2021-09-27 DIAGNOSIS — M25611 Stiffness of right shoulder, not elsewhere classified: Secondary | ICD-10-CM

## 2021-09-27 DIAGNOSIS — R293 Abnormal posture: Secondary | ICD-10-CM

## 2021-09-27 NOTE — Therapy (Signed)
[facility] @ [facility] [address] [place], [place], [address] Phone: [phone]   Fax:  [phone]  Physical Therapy Treatment  Patient Details  Name: Sandra Bishop MRN: [id number] Date of Birth: 1971/04/08 Referring Provider (PT): Dr. Pamelia Hoit   Encounter Date: 09/27/2021   PT End of Session - 09/27/21 0954     Visit Number 6    Number of Visits 14    Date for PT Re-Evaluation 10/19/21    PT Start Time 1000    PT Stop Time 1053    PT Time Calculation (min) 53 min    Activity Tolerance Patient tolerated treatment well    Behavior During Therapy [facility] for tasks assessed/performed             Past Medical History:  Diagnosis Date   Allergy    Anemia    Breast cancer (HCC)    Right   Constipation    Diabetes mellitus without complication (HCC)    Family history of adverse reaction to anesthesia    Aunt is hard to wake up from general anesthesia   Family history of breast cancer    Family history of colon cancer    Family history of lung cancer    Family history of pancreatic cancer    History of migraine    during pregancy   Obesity    Palpitations     Past Surgical History:  Procedure Laterality Date   BREAST BIOPSY Right 02/11/2021   x2   BREAST BIOPSY Right 03/22/2021   x3   BREAST LUMPECTOMY WITH RADIOACTIVE SEED AND SENTINEL LYMPH NODE BIOPSY Right 08/17/2021   Procedure: RIGHT BREAST LUMPECTOMY WITH RADIOACTIVE SEED AND SENTINEL LYMPH NODE BIOPSY;  Surgeon: Almond Lint, MD;  [facility];  Service: General;  Laterality: Right;   EYE SURGERY Right    PORT-A-CATH REMOVAL N/A 08/17/2021   Procedure: REMOVAL PORT-A-CATH;  Surgeon: Almond Lint, MD;  Location[facility];  Service: General;  Laterality: N/A;   PORTACATH PLACEMENT N/A 03/02/2021   Procedure: INSERTION PORT-A-CATH;  Surgeon: Almond Lint, MD;  [facility];  Service: General;  Laterality: N/A;   RADIOACTIVE SEED GUIDED AXILLARY SENTINEL  LYMPH NODE Right 08/17/2021   Procedure: RADIOACTIVE SEED GUIDED RIGHT SENTINEL LYMPH NODE EXCISION;  Surgeon: Almond Lint, MD;  [facility];  Service: General;  Laterality: Right;   RADIOACTIVE SEED GUIDED EXCISIONAL BREAST BIOPSY Left 08/17/2021   Procedure: RADIOACTIVE SEED GUIDED EXCISIONAL LEFT BREAST BIOPSY;  Surgeon: Almond Lint, MD;  [facility];  Service: General;  Laterality: Left;   TUBAL LIGATION      There were no vitals filed for this visit.   Subjective Assessment - 09/27/21 0954     Subjective I feel the improvement. Cording  felt like it was gone, but it came back. I did the manual lymph drainage and I could feeling it moving.  I have not been wearing the compression bra as much.. I feel like it makes me swell sometimes. I just finished radiation too. He gave me the cream on Friday. A little throbbing in my breast. The numbness feels like it is improving.    Pertinent History Pts.Screening mammogram determined  mass in the right breast  Diagnostic mammogram and [place]: a persistent irregular mass 1.2 cm within the upper-outer right breast. 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 Pt has finished neoadjuvant chemo and had right  Lumpectomy with SLNB on 08/17/2021 with 0/3 LN's. She will have radiation and anti-estrogens                               [facility] Adult PT Treatment/Exercise - 09/27/21 0001       Manual Therapy   Manual Therapy Myofascial release;Manual Lymphatic Drainage (MLD);Passive ROM    Myofascial Release MFR at areas of cording in upper arm and antecubital fossa and forearm   Manual Lymphatic Drainage (MLD) In Supine, head of table elevated: Short neck, 5 diaphragmatic breaths, Lt axillary and Rt inguinal nodes, anterior inter-axillayr and Rt axillo-inguinal anastomosis, then focused on Rt breast, then finished retracing all steps in supine begining to instruct pt  throughout and having her return demo of each sequence with tactile and VCs for light pressure and skin stretch only, no sliding.    Passive ROM PROM right shoulder flexion and abd in supine                          PT Long Term Goals - 09/07/21 1212       PT LONG TERM GOAL #1   Title Pt will regain right shoulder AROM within 5-10 degrees of baseline for ability to peform home and work activities    Time 6    Period Weeks    Status New    Target Date 10/19/21      PT LONG TERM GOAL #2   Title Pain and tightness from cording decreased atleast 50%    Time 6    Period Weeks    Status New    Target Date 10/19/21      PT LONG TERM GOAL #3   Title Pt will attend ABC and will have a good understanding of skin care and precautions for lymphedema    Time 6    Period Weeks    Status New    Target Date 10/19/21      PT LONG TERM GOAL #4   Title Pt will  purchase compression bra and will have 50% reduction in edema    Time 6    Period Weeks    Status New    Target Date 10/19/21      PT LONG TERM GOAL #5   Title Pt will be educated and compliant with MLD as need for right breast swelling    Time 6    Period Weeks    Target Date 10/19/21                   Plan - 09/27/21 0955     Clinical Impression Statement Continued MFR to cording as pt had a return of several very large cords and 2 small nodules in the antecubital region which did improve but were still palpable after MFR. Continued Right breast MLD and instruction to pt with pt requiring verbal and tactile cues for correct form. She has a good understanding of sequence and she has been practicing at home and believes she can feel the fluid moving.  Left breast is still swollen laterally and pt was advised to try her compression bra again. Advised to put on sleeve for cording    Personal Factors and Comorbidities Comorbidity 2    Comorbidities Breast Cancer with 3 LN removed, chemo, pending radiation, DM     Examination-Activity Limitations Reach Overhead;Dressing;Lift    Examination-Participation Restrictions Occupation  Stability/Clinical Decision Making Stable/Uncomplicated    Rehab Potential Excellent    PT Frequency 2x / week    PT Duration 6 weeks    PT Treatment/Interventions Therapeutic exercise;Patient/family education;Orthotic Fit/Training;Manual techniques;Passive range of motion;Scar mobilization;Manual lymph drainage;Vasopneumatic Device    PT Next Visit Plan Cont MLD to right breast and review this with pt, MFR to cording, PROM, scar mobilization to SLNB scar, ?pulleys, ,    PT Home Exercise Plan 4 post op exercises, wall chest stretch with wrist oscillations; self MLD to Rt breast    Consulted and Agree with Plan of Care Patient             Patient will benefit from skilled therapeutic intervention in order to improve the following deficits and impairments:  Decreased range of motion, Increased fascial restricitons, Impaired UE functional use, Decreased activity tolerance, Decreased knowledge of precautions, Pain, Decreased scar mobility, Impaired flexibility, Decreased strength, Increased edema, Postural dysfunction  Visit Diagnosis: Malignant neoplasm of upper-outer quadrant of right breast in female, estrogen receptor positive (HCC)  Abnormal posture  Stiffness of right shoulder, not elsewhere classified  Localized edema     Problem List Patient Active Problem List   Diagnosis Date Noted   Hyperglycemia due to type 2 diabetes mellitus (HCC) 07/08/2021   Pure hypercholesterolemia 07/08/2021   Morbid obesity (HCC) 07/08/2021   Chemotherapy-induced peripheral neuropathy (HCC) 07/08/2021   Genetic testing 03/17/2021   Port-A-Cath in place 03/03/2021   Family history of breast cancer 02/24/2021   Family history of pancreatic cancer 02/24/2021   Family history of colon cancer 02/24/2021   Family history of lung cancer 02/24/2021   Malignant neoplasm of  upper-outer quadrant of right breast in female, estrogen receptor positive (HCC) 02/15/2021   Obesity    Constipation 08/30/2011    Waynette Buttery, PT 09/27/2021, 11:01 AM  [facility] @ [facility] [address] [place], [place], [address] Phone: [phone]   Fax:  [phone]  Name: KATHEE HUTCHESON MRN: [id number] Date of Birth: October 21, 1970

## 2021-09-28 ENCOUNTER — Ambulatory Visit
Admission: RE | Admit: 2021-09-28 | Discharge: 2021-09-28 | Disposition: A | Discharge status: Home / Self Care | Payer: BC Managed Care – PPO | Source: Ambulatory Visit | Attending: Radiation Oncology | Admitting: Radiation Oncology

## 2021-09-28 ENCOUNTER — Telehealth: Payer: Self-pay | Admitting: *Deleted

## 2021-09-28 DIAGNOSIS — C50411 Malignant neoplasm of upper-outer quadrant of right female breast: Secondary | ICD-10-CM | POA: Diagnosis not present

## 2021-09-28 NOTE — Telephone Encounter (Signed)
Received call from pt with complaint of trouble falling asleep at night.  Pt states she will lay down around 8 pm and won't fall asleep until 1 am.  Pt denies any feelings of anxiety causing difficulty sleeping.  Per MD pt to take OTC melatonin 3-5 mg p.o daily at bedtime.  RN educated pt on melatonin as well as decreased screen time prior to bed.  Pt verbalized understanding and appreciative of advice.  ?

## 2021-09-29 ENCOUNTER — Ambulatory Visit: Payer: BC Managed Care – PPO

## 2021-09-29 ENCOUNTER — Encounter (HOSPITAL_COMMUNITY): Payer: Self-pay

## 2021-09-29 ENCOUNTER — Other Ambulatory Visit: Payer: Self-pay

## 2021-09-29 ENCOUNTER — Ambulatory Visit
Admission: RE | Admit: 2021-09-29 | Discharge: 2021-09-29 | Disposition: A | Discharge status: Home / Self Care | Payer: BC Managed Care – PPO | Source: Ambulatory Visit | Attending: Radiation Oncology | Admitting: Radiation Oncology

## 2021-09-29 DIAGNOSIS — C50411 Malignant neoplasm of upper-outer quadrant of right female breast: Secondary | ICD-10-CM | POA: Diagnosis not present

## 2021-09-29 NOTE — Therapy (Signed)
[facility] ?[place], [place], [address] ?Phone: [phone]   Fax:  [phone] ? ?Physical Therapy Treatment ? ?Patient Details  ?Name: Sandra Bishop ?MRN: [id number] ?Date of Birth: May 19, 1971 ?Referring Provider (PT): Dr. Lindi Adie ? ? ?Encounter Date: 09/29/2021 ? ? PT End of Session - 09/29/21 0956   ? ? Visit Number 7   ? Number of Visits 14   ? PT Start Time [phone]   ? PT Stop Time 1052   ? PT Time Calculation (min) 54 min   ? Activity Tolerance Patient tolerated treatment well   ? Behavior During Therapy [facility] for tasks assessed/performed   ? ?  ?  ? ?  ? ? ?Past Medical History:  ?Diagnosis Date  ? Allergy   ? Anemia   ? Breast cancer ([place])   ? Right  ? Constipation   ? Diabetes mellitus without complication ([place])   ? Family history of adverse reaction to anesthesia   ? Aunt is hard to wake up from general anesthesia  ? Family history of breast cancer   ? Family history of colon cancer   ? Family history of lung cancer   ? Family history of pancreatic cancer   ? History of migraine   ? during pregancy  ? Obesity   ? Palpitations   ? ? ?Past Surgical History:  ?Procedure Laterality Date  ? BREAST BIOPSY Right 02/11/2021  ? x2  ? BREAST BIOPSY Right 03/22/2021  ? x3  ? BREAST LUMPECTOMY WITH RADIOACTIVE SEED AND SENTINEL LYMPH NODE BIOPSY Right 08/17/2021  ? Procedure: RIGHT BREAST LUMPECTOMY WITH RADIOACTIVE SEED AND SENTINEL LYMPH NODE BIOPSY;  Surgeon: [name], MD;  Location: [place] [facility];  Service: General;  Laterality: Right;  ? EYE SURGERY Right   ? PORT-A-CATH REMOVAL N/A 08/17/2021  ? Procedure: REMOVAL PORT-A-CATH;  Surgeon: [name], MD;  Location: [facility];  Service: General;  Laterality: N/A;  ? PORTACATH PLACEMENT N/A 03/02/2021  ? Procedure: INSERTION PORT-A-CATH;  Surgeon: [name], MD;  [facility];  Service: General;  Laterality: N/A;  ? RADIOACTIVE SEED GUIDED AXILLARY SENTINEL LYMPH NODE Right 08/17/2021  ? Procedure:  RADIOACTIVE SEED GUIDED RIGHT SENTINEL LYMPH NODE EXCISION;  Surgeon: [name], MD;  Location: [facility] [place];  Service: General;  Laterality: Right;  ? RADIOACTIVE SEED GUIDED EXCISIONAL BREAST BIOPSY Left 08/17/2021  ? Procedure: RADIOACTIVE SEED GUIDED EXCISIONAL LEFT BREAST BIOPSY;  Surgeon: [name], MD;  Location: [place] [facility];  Service: General;  Laterality: Left;  ? TUBAL LIGATION    ? ? ?There were no vitals filed for this visit. ? ? Subjective Assessment - 09/29/21 0957   ? ? Subjective Everything is much better. The cording feels better. I have been wearing the compression bra since Tuesday even at night and all day yesterday, and I have it on today.  I have been wearing my sleeve today. I can't see a difference in my breast;it still feels swollen. Radiation is going well.   ? Pertinent History Pts.Screening mammogram determined  mass in the right breast  Diagnostic mammogram and [place]: a persistent irregular mass 1.2 cm within the upper-outer right breast. 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 Pt has finished neoadjuvant chemo and had right Lumpectomy with SLNB on 08/17/2021 with 0/3 LN's. She will have radiation and anti-estrogens   ? ?  ?  ? ?  ? ? ? ? ? ? ? ? ? ? ? ? ? ? ? ? ? ? ? ?  [facility] Adult PT Treatment/Exercise - 09/29/21 0001   ? ?  ? Manual Therapy  ? Manual Therapy Myofascial release;Passive ROM   ? Edema Management half inch gray foam pad made for lateral breast   ? Myofascial Release MFR at areas of cording in axila and antecubital fossa   ? Manual Lymphatic Drainage (MLD) In Supine, head of table elevated: Short neck, 5 diaphragmatic breaths, Lt axillary and Rt inguinal nodes, anterior inter-axillayr and Rt axillo-inguinal anastomosis, then focused on Rt breast, then finished retracing all steps in supine  instructing  pt throughout and having her return demo of each sequence with tactile and VCs for light pressure and skin  stretch only,    ? Passive ROM PROM right shoulder flexion and abd in supine   ? ?  ?  ? ?  ? ? ? ? ? ? ? ? ? ? ? ? ? ? ? PT Long Term Goals - 09/07/21 1212   ? ?  ? PT LONG TERM GOAL #1  ? Title Pt will regain right shoulder AROM within 5-10 degrees of baseline for ability to peform home and work activities   ? Time 6   ? Period Weeks   ? Status New   ? Target Date 10/19/21   ?  ? PT LONG TERM GOAL #2  ? Title Pain and tightness from cording decreased atleast 50%   ? Time 6   ? Period Weeks   ? Status New   ? Target Date 10/19/21   ?  ? PT LONG TERM GOAL #3  ? Title Pt will attend ABC and will have a good understanding of skin care and precautions for lymphedema   ? Time 6   ? Period Weeks   ? Status New   ? Target Date 10/19/21   ?  ? PT LONG TERM GOAL #4  ? Title Pt will  purchase compression bra and will have 50% reduction in edema   ? Time 6   ? Period Weeks   ? Status New   ? Target Date 10/19/21   ?  ? PT LONG TERM GOAL #5  ? Title Pt will be educated and compliant with MLD as need for right breast swelling   ? Time 6   ? Period Weeks   ? Target Date 10/19/21   ? ?  ?  ? ?  ? ? ? ? ? ? ? ? Plan - 09/29/21 0956   ? ? Clinical Impression Statement pt continues with several large cords from the axilla and crossing the elbow with elbow cords especially tight.  Continued MFR techniques and no pops were felt but her arm felt much better afterwards.  Continued MLD and instruction to pt in left breast MLD. Pt continues to require VC's and tactile cues but does well after correction. Pt was given a half inch foam pad  to place in her bra at the lateral breast area of firmness.   ? Personal Factors and Comorbidities Comorbidity 2   ? Comorbidities Breast Cancer with 3 LN removed, chemo, pending radiation, DM   ? Examination-Activity Limitations Reach Overhead;Dressing;Lift   ? Stability/Clinical Decision Making Stable/Uncomplicated   ? Rehab Potential Excellent   ? PT Frequency 2x / week   ? PT Duration 6 weeks   ? PT  Treatment/Interventions Therapeutic exercise;Patient/family education;Orthotic Fit/Training;Manual techniques;Passive range of motion;Scar mobilization;Manual lymph drainage;Vasopneumatic Device   ? PT Next Visit Plan Did foam pad help? Cont MLD to right  breast and review this with pt, MFR to cording, PROM, scar mobilization to SLNB scar, ?pulleys, ,   ? PT Home Exercise Plan 4 post op exercises, wall chest stretch with wrist oscillations; self MLD to Rt breast   ? Consulted and Agree with Plan of Care Patient   ? ?  ?  ? ?  ? ? ?Patient will benefit from skilled therapeutic intervention in order to improve the following deficits and impairments:  Decreased range of motion, Increased fascial restricitons, Impaired UE functional use, Decreased activity tolerance, Decreased knowledge of precautions, Pain, Decreased scar mobility, Impaired flexibility, Decreased strength, Increased edema, Postural dysfunction ? ?Visit Diagnosis: ?Malignant neoplasm of upper-outer quadrant of right breast in female, estrogen receptor positive ([place]) ? ?Abnormal posture ? ?Stiffness of right shoulder, not elsewhere classified ? ?Localized edema ? ? ? ? ?Problem List ?Patient Active Problem List  ? Diagnosis Date Noted  ? Hyperglycemia due to type 2 diabetes mellitus ([place]) 07/08/2021  ? Pure hypercholesterolemia 07/08/2021  ? Morbid obesity ([place]) 07/08/2021  ? Chemotherapy-induced peripheral neuropathy ([place]) 07/08/2021  ? Genetic testing 03/17/2021  ? Port-A-Cath in place 03/03/2021  ? Family history of breast cancer 02/24/2021  ? Family history of pancreatic cancer 02/24/2021  ? Family history of colon cancer 02/24/2021  ? Family history of lung cancer 02/24/2021  ? Malignant neoplasm of upper-outer quadrant of right breast in female, estrogen receptor positive ([place]) 02/15/2021  ? Obesity   ? Constipation 08/30/2011  ? ? ?Claris Pong, PT ?09/29/2021, 11:00 AM ? ?[facility] ?[place], [place], [address] ?Phone: [phone]   Fax:  [phone] ? ?Name: Sandra Bishop ?MRN: [id number] ?Date of Birth: 1970/11/22 ? ? ? ?

## 2021-09-30 ENCOUNTER — Ambulatory Visit
Admission: RE | Admit: 2021-09-30 | Discharge: 2021-09-30 | Disposition: A | Discharge status: Home / Self Care | Payer: BC Managed Care – PPO | Source: Ambulatory Visit | Attending: Radiation Oncology | Admitting: Radiation Oncology

## 2021-09-30 DIAGNOSIS — C50411 Malignant neoplasm of upper-outer quadrant of right female breast: Secondary | ICD-10-CM | POA: Diagnosis not present

## 2021-10-03 ENCOUNTER — Ambulatory Visit
Admission: RE | Admit: 2021-10-03 | Discharge: 2021-10-03 | Disposition: A | Discharge status: Home / Self Care | Payer: BC Managed Care – PPO | Source: Ambulatory Visit | Attending: Radiation Oncology | Admitting: Radiation Oncology

## 2021-10-03 ENCOUNTER — Other Ambulatory Visit: Payer: Self-pay

## 2021-10-03 DIAGNOSIS — C50411 Malignant neoplasm of upper-outer quadrant of right female breast: Secondary | ICD-10-CM | POA: Diagnosis not present

## 2021-10-04 ENCOUNTER — Ambulatory Visit
Admission: RE | Admit: 2021-10-04 | Discharge: 2021-10-04 | Disposition: A | Discharge status: Home / Self Care | Payer: BC Managed Care – PPO | Source: Ambulatory Visit | Attending: Radiation Oncology | Admitting: Radiation Oncology

## 2021-10-04 ENCOUNTER — Ambulatory Visit: Payer: BC Managed Care – PPO

## 2021-10-04 DIAGNOSIS — R293 Abnormal posture: Secondary | ICD-10-CM

## 2021-10-04 DIAGNOSIS — M25611 Stiffness of right shoulder, not elsewhere classified: Secondary | ICD-10-CM

## 2021-10-04 DIAGNOSIS — C50411 Malignant neoplasm of upper-outer quadrant of right female breast: Secondary | ICD-10-CM

## 2021-10-04 DIAGNOSIS — R600 Localized edema: Secondary | ICD-10-CM

## 2021-10-04 NOTE — Therapy (Signed)
[facility] ?[facility] ?[place], [place], [address] ?Phone: [phone]   Fax:  [phone] ? ?Physical Therapy Treatment ? ?Patient Details  ?Name: Sandra Bishop ?MRN: [id number] ?Date of Birth: [birth year] ?Referring Provider (PT): Dr. Lindi Adie ? ? ?Encounter Date: 10/04/2021 ? ? PT End of Session - 10/04/21 0950   ? ? Visit Number 8   ? Number of Visits 14   ? Date for PT Re-Evaluation 10/19/21   ? PT Start Time [phone]   ? PT Stop Time 1050   ? PT Time Calculation (min) 57 min   ? Activity Tolerance Patient tolerated treatment well   ? Behavior During Therapy [facility] for tasks assessed/performed   ? ?  ?  ? ?  ? ? ?Past Medical History:  ?Diagnosis Date  ? Allergy   ? Anemia   ? Breast cancer ([place])   ? Right  ? Constipation   ? Diabetes mellitus without complication ([place])   ? Family history of adverse reaction to anesthesia   ? Aunt is hard to wake up from general anesthesia  ? Family history of breast cancer   ? Family history of colon cancer   ? Family history of lung cancer   ? Family history of pancreatic cancer   ? History of migraine   ? during pregancy  ? Obesity   ? Palpitations   ? ? ?Past Surgical History:  ?Procedure Laterality Date  ? BREAST BIOPSY Right 02/11/2021  ? x2  ? BREAST BIOPSY Right 03/22/2021  ? x3  ? BREAST LUMPECTOMY WITH RADIOACTIVE SEED AND SENTINEL LYMPH NODE BIOPSY Right 08/17/2021  ? Procedure: RIGHT BREAST LUMPECTOMY WITH RADIOACTIVE SEED AND SENTINEL LYMPH NODE BIOPSY;  Surgeon: [name], MD;  Location: [facility];  Service: General;  Laterality: Right;  ? EYE SURGERY Right   ? PORT-A-CATH REMOVAL N/A 08/17/2021  ? Procedure: REMOVAL PORT-A-CATH;  Surgeon: [name], MD;  Location: [facility];  Service: General;  Laterality: N/A;  ? PORTACATH PLACEMENT N/A 03/02/2021  ? Procedure: INSERTION PORT-A-CATH;  Surgeon: [name], MD;  [facility];  Service: General;  Laterality: N/A;  ? RADIOACTIVE SEED GUIDED AXILLARY SENTINEL  LYMPH NODE Right 08/17/2021  ? Procedure: RADIOACTIVE SEED GUIDED RIGHT SENTINEL LYMPH NODE EXCISION;  Surgeon: [name], MD;  Location: [facility];  Service: General;  Laterality: Right;  ? RADIOACTIVE SEED GUIDED EXCISIONAL BREAST BIOPSY Left 08/17/2021  ? Procedure: RADIOACTIVE SEED GUIDED EXCISIONAL LEFT BREAST BIOPSY;  Surgeon: [name], MD;  Location: [facility];  Service: General;  Laterality: Left;  ? TUBAL LIGATION    ? ? ?There were no vitals filed for this visit. ? ? Subjective Assessment - 10/04/21 1002   ? ? Subjective I have been wearing the medium sleeve but the cord is still there and feels tight today.  My sugar is up and I need to see the Dr. again. The foam pad helps soften things and I have been wearing every day. Radiation is going well.   ? Pertinent History Pts.Screening mammogram determined  mass in the right breast  Diagnostic mammogram and [place]: a persistent irregular mass 1.2 cm within the upper-outer right breast. 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 Pt has finished neoadjuvant chemo and had right Lumpectomy with SLNB on 08/17/2021 with 0/3 LN's. She will have radiation and anti-estrogens   ? ?  ?  ? ?  ? ? ? ? ? ? ? ? ? ? ? ? ? ? ? ? ? ? ? ?  [facility] Adult PT Treatment/Exercise - 10/04/21 0001   ? ?  ? Shoulder Exercises: Pulleys  ? Flexion 2 minutes   ? Flexion Limitations return demonstrated   ? ABduction 2 minutes   ? ABduction Limitations return demonstrated   ?  ? Manual Therapy  ? Manual Therapy Myofascial release;Passive ROM   ? Myofascial Release MFR at areas of cording in axilla and antecubital fossa and forearm.   ? Manual Lymphatic Drainage (MLD) In Supine, head of table elevated: Short neck, 5 diaphragmatic breaths, Lt axillary and Rt inguinal nodes, anterior inter-axillayr and Rt axillo-inguinal anastomosis, then focused on Rt breast, then finished retracing all steps in supine and ending with LN's  ?  Passive ROM PROM right shoulder flexion and abd in supine   ? ?  ?  ? ?  ? ? ? ? ? ? ? ? ? ? ? ? ? ? ? PT Long Term Goals - 09/07/21 1212   ? ?  ? PT LONG TERM GOAL #1  ? Title Pt will regain right shoulder AROM within 5-10 degrees of baseline for ability to peform home and work activities   ? Time 6   ? Period Weeks   ? Status New   ? Target Date 10/19/21   ?  ? PT LONG TERM GOAL #2  ? Title Pain and tightness from cording decreased atleast 50%   ? Time 6   ? Period Weeks   ? Status New   ? Target Date 10/19/21   ?  ? PT LONG TERM GOAL #3  ? Title Pt will attend ABC and will have a good understanding of skin care and precautions for lymphedema   ? Time 6   ? Period Weeks   ? Status New   ? Target Date 10/19/21   ?  ? PT LONG TERM GOAL #4  ? Title Pt will  purchase compression bra and will have 50% reduction in edema   ? Time 6   ? Period Weeks   ? Status New   ? Target Date 10/19/21   ?  ? PT LONG TERM GOAL #5  ? Title Pt will be educated and compliant with MLD as need for right breast swelling   ? Time 6   ? Period Weeks   ? Target Date 10/19/21   ? ?  ?  ? ?  ? ? ? ? ? ? ? ? Plan - 10/04/21 0951   ? ? Clinical Impression Statement The foam pad did soften the right breast incision area very nicely. Performed MFR techniques to right UE cording; Thick cord still visible and palpable at the antecubital region and runs uppward toward the axilla and down into the forearm.  It felt much looser after treatment, but there were no pops. Cording is not nearly as painful for pt.. Pt advised to see MD about blood sugar.   ? Personal Factors and Comorbidities Comorbidity 2   ? Comorbidities Breast Cancer with 3 LN removed, chemo, pending radiation, DM   ? Examination-Activity Limitations Reach Overhead;Dressing;Lift   ? Examination-Participation Restrictions Occupation   ? Stability/Clinical Decision Making Stable/Uncomplicated   ? Rehab Potential Excellent   ? PT Frequency 2x / week   ? PT Duration 6 weeks   ? PT  Treatment/Interventions Therapeutic exercise;Patient/family education;Orthotic Fit/Training;Manual techniques;Passive range of motion;Scar mobilization;Manual lymph drainage;Vasopneumatic Device   ? PT Next Visit Plan Did foam pad help? Cont MLD to right breast and review this with pt,  MFR to cording, PROM, scar mobilization to SLNB scar, ?pulleys, ,   ? PT Home Exercise Plan 4 post op exercises, wall chest stretch with wrist oscillations; self MLD to Rt breast   ? Consulted and Agree with Plan of Care Patient   ? ?  ?  ? ?  ? ? ?Patient will benefit from skilled therapeutic intervention in order to improve the following deficits and impairments:  Decreased range of motion, Increased fascial restricitons, Impaired UE functional use, Decreased activity tolerance, Decreased knowledge of precautions, Pain, Decreased scar mobility, Impaired flexibility, Decreased strength, Increased edema, Postural dysfunction ? ?Visit Diagnosis: ?Malignant neoplasm of upper-outer quadrant of right breast in female, estrogen receptor positive ([place]) ? ?Abnormal posture ? ?Stiffness of right shoulder, not elsewhere classified ? ?Localized edema ? ? ? ? ?Problem List ?Patient Active Problem List  ? Diagnosis Date Noted  ? Hyperglycemia due to type 2 diabetes mellitus ([place]) 07/08/2021  ? Pure hypercholesterolemia 07/08/2021  ? Morbid obesity ([place]) 07/08/2021  ? Chemotherapy-induced peripheral neuropathy ([place]) 07/08/2021  ? Genetic testing 03/17/2021  ? Port-A-Cath in place 03/03/2021  ? Family history of breast cancer 02/24/2021  ? Family history of pancreatic cancer 02/24/2021  ? Family history of colon cancer 02/24/2021  ? Family history of lung cancer 02/24/2021  ? Malignant neoplasm of upper-outer quadrant of right breast in female, estrogen receptor positive ([place]) 02/15/2021  ? Obesity   ? Constipation 08/30/2011  ? ? ?Claris Pong, PT ?10/04/2021, 10:57 AM ? ?[facility] ?[facility] ?[place], [place], [address] ?Phone: [phone]   Fax:  [phone] ? ?Name: Sandra Bishop ?MRN: [id number] ?Date of Birth: 12/27/1970 ? ? ? ?

## 2021-10-05 ENCOUNTER — Other Ambulatory Visit: Payer: Self-pay

## 2021-10-05 ENCOUNTER — Ambulatory Visit
Admission: RE | Admit: 2021-10-05 | Discharge: 2021-10-05 | Disposition: A | Discharge status: Home / Self Care | Payer: BC Managed Care – PPO | Source: Ambulatory Visit | Attending: Radiation Oncology | Admitting: Radiation Oncology

## 2021-10-05 DIAGNOSIS — C50411 Malignant neoplasm of upper-outer quadrant of right female breast: Secondary | ICD-10-CM | POA: Diagnosis not present

## 2021-10-06 ENCOUNTER — Ambulatory Visit
Admission: RE | Admit: 2021-10-06 | Discharge: 2021-10-06 | Disposition: A | Discharge status: Home / Self Care | Payer: BC Managed Care – PPO | Source: Ambulatory Visit | Attending: Radiation Oncology | Admitting: Radiation Oncology

## 2021-10-06 DIAGNOSIS — C50411 Malignant neoplasm of upper-outer quadrant of right female breast: Secondary | ICD-10-CM | POA: Diagnosis not present

## 2021-10-07 ENCOUNTER — Other Ambulatory Visit: Payer: Self-pay

## 2021-10-07 ENCOUNTER — Encounter: Payer: Self-pay | Admitting: Licensed Clinical Social Worker

## 2021-10-07 ENCOUNTER — Ambulatory Visit
Admission: RE | Admit: 2021-10-07 | Discharge: 2021-10-07 | Disposition: A | Discharge status: Home / Self Care | Payer: BC Managed Care – PPO | Source: Ambulatory Visit | Attending: Radiation Oncology | Admitting: Radiation Oncology

## 2021-10-07 DIAGNOSIS — C50411 Malignant neoplasm of upper-outer quadrant of right female breast: Secondary | ICD-10-CM | POA: Diagnosis not present

## 2021-10-07 NOTE — Progress Notes (Signed)
[facility] CSW Progress Note ? ?Clinical Social Worker received TC from pt asking about assistance with gas and electric bills. Per pt, she went to DSS to apply for food stamps and asked about assistance then and was told to wait until past-due notice was received. Pt then brought that in and was told they were out of funds. CSW shared information on [employer] and [employer] which accept applications the first business day of the month. ? ?CSW also sent a follow-up message to [facility] to determine the application status. ? ? ? ?Christeen Douglas , LCSW ?

## 2021-10-10 ENCOUNTER — Other Ambulatory Visit: Payer: Self-pay

## 2021-10-10 ENCOUNTER — Ambulatory Visit
Admission: RE | Admit: 2021-10-10 | Payer: BC Managed Care – PPO | Source: Ambulatory Visit | Admitting: Radiation Oncology

## 2021-10-11 ENCOUNTER — Ambulatory Visit
Admission: RE | Admit: 2021-10-11 | Discharge: 2021-10-11 | Disposition: A | Discharge status: Home / Self Care | Payer: BC Managed Care – PPO | Source: Ambulatory Visit | Attending: Radiation Oncology | Admitting: Radiation Oncology

## 2021-10-11 DIAGNOSIS — C50411 Malignant neoplasm of upper-outer quadrant of right female breast: Secondary | ICD-10-CM | POA: Diagnosis not present

## 2021-10-12 ENCOUNTER — Other Ambulatory Visit: Payer: Self-pay

## 2021-10-12 ENCOUNTER — Ambulatory Visit
Admission: RE | Admit: 2021-10-12 | Discharge: 2021-10-12 | Disposition: A | Discharge status: Home / Self Care | Payer: BC Managed Care – PPO | Source: Ambulatory Visit | Attending: Radiation Oncology | Admitting: Radiation Oncology

## 2021-10-12 DIAGNOSIS — C50411 Malignant neoplasm of upper-outer quadrant of right female breast: Secondary | ICD-10-CM | POA: Diagnosis not present

## 2021-10-13 ENCOUNTER — Ambulatory Visit
Admission: RE | Admit: 2021-10-13 | Discharge: 2021-10-13 | Disposition: A | Discharge status: Home / Self Care | Payer: BC Managed Care – PPO | Source: Ambulatory Visit | Attending: Radiation Oncology | Admitting: Radiation Oncology

## 2021-10-13 DIAGNOSIS — C50411 Malignant neoplasm of upper-outer quadrant of right female breast: Secondary | ICD-10-CM | POA: Diagnosis not present

## 2021-10-14 ENCOUNTER — Other Ambulatory Visit: Payer: Self-pay

## 2021-10-14 ENCOUNTER — Ambulatory Visit
Admission: RE | Admit: 2021-10-14 | Discharge: 2021-10-14 | Disposition: A | Discharge status: Home / Self Care | Payer: BC Managed Care – PPO | Source: Ambulatory Visit | Attending: Radiation Oncology | Admitting: Radiation Oncology

## 2021-10-14 DIAGNOSIS — C50411 Malignant neoplasm of upper-outer quadrant of right female breast: Secondary | ICD-10-CM | POA: Diagnosis not present

## 2021-10-17 ENCOUNTER — Other Ambulatory Visit: Payer: Self-pay

## 2021-10-17 ENCOUNTER — Ambulatory Visit
Admission: RE | Admit: 2021-10-17 | Discharge: 2021-10-17 | Disposition: A | Discharge status: Home / Self Care | Payer: BC Managed Care – PPO | Source: Ambulatory Visit | Attending: Radiation Oncology | Admitting: Radiation Oncology

## 2021-10-17 DIAGNOSIS — Z170 Estrogen receptor positive status [ER+]: Secondary | ICD-10-CM | POA: Diagnosis present

## 2021-10-17 DIAGNOSIS — C50411 Malignant neoplasm of upper-outer quadrant of right female breast: Secondary | ICD-10-CM | POA: Insufficient documentation

## 2021-10-18 ENCOUNTER — Ambulatory Visit
Admission: RE | Admit: 2021-10-18 | Discharge: 2021-10-18 | Disposition: A | Discharge status: Home / Self Care | Payer: BC Managed Care – PPO | Source: Ambulatory Visit | Attending: Radiation Oncology | Admitting: Radiation Oncology

## 2021-10-18 ENCOUNTER — Ambulatory Visit: Payer: BC Managed Care – PPO | Admitting: Radiation Oncology

## 2021-10-18 DIAGNOSIS — C50411 Malignant neoplasm of upper-outer quadrant of right female breast: Secondary | ICD-10-CM | POA: Diagnosis not present

## 2021-10-19 ENCOUNTER — Other Ambulatory Visit: Payer: Self-pay

## 2021-10-19 ENCOUNTER — Telehealth: Payer: Self-pay | Admitting: Hematology and Oncology

## 2021-10-19 ENCOUNTER — Ambulatory Visit
Admission: RE | Admit: 2021-10-19 | Discharge: 2021-10-19 | Disposition: A | Discharge status: Home / Self Care | Payer: BC Managed Care – PPO | Source: Ambulatory Visit | Attending: Radiation Oncology | Admitting: Radiation Oncology

## 2021-10-19 DIAGNOSIS — C50411 Malignant neoplasm of upper-outer quadrant of right female breast: Secondary | ICD-10-CM | POA: Diagnosis not present

## 2021-10-19 NOTE — Telephone Encounter (Signed)
Called patient to schedule appointment per 3/7 inbasket, patient is aware of date and time.  ?

## 2021-10-19 NOTE — Progress Notes (Signed)
? ?Patient Care Team: ?[name], MD as PCP - General (Family Medicine) ?[name], RN as Oncology Nurse Navigator ?[name], RN as Oncology Nurse Navigator ?[name], MD as Consulting Physician (General Surgery) ?[name], MD as Consulting Physician (Hematology and Oncology) ?[name], MD as Consulting Physician (Radiation Oncology) ? ?DIAGNOSIS:  ?Encounter Diagnosis  ?Name Primary?  ? Malignant neoplasm of upper-outer quadrant of right breast in female, estrogen receptor positive ([place])   ? ? ?SUMMARY OF ONCOLOGIC HISTORY: ?Oncology History  ?Malignant neoplasm of upper-outer quadrant of right breast in female, estrogen receptor positive ([place])  ?02/15/2021 Initial Diagnosis  ? Screening mammogram: a possible mass in the right breast  Diagnostic mammogram and [place]: a persistent irregular mass 1.2 cm within the upper-outer right breast. 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  ?  ?02/23/2021 Cancer Staging  ? Staging form: Breast, AJCC 8th Edition ?- Clinical stage from 02/23/2021: Stage IIB (cT1c, cN1(f), cM0, G3, ER+, PR-, HER2-) - Signed by [name], MD on 02/23/2021 ?Stage prefix: Initial diagnosis ?Method of lymph node assessment: Core biopsy ?Histologic grading system: 3 grade system ? ?  ?03/04/2021 - 07/01/2021 Neo-Adjuvant Chemotherapy  ? Received four cycles of neoadjuvant Adriamycin and Cytoxan from 03/04/2021-04/14/2021.  Followed by 9 cycles of Taxol from 04/28/2021-07/01/2021.  Taxol was dose reduced beginning with cycle 6 due to restless legs and stopped after 9 cycles due to peripheral neuropathy.  ?  ?03/10/2021 Genetic Testing  ? Negative genetic testing. No pathogenic variants identified on the [facility] CancerNext-Expanded+RNA panel. The report date is 03/10/2021. ? ?The CancerNext-Expanded + RNAinsight gene panel offered by [employer] and includes sequencing and rearrangement analysis  for the following 77 genes: IP, ALK, APC*, ATM*, AXIN2, BAP1, BARD1, BLM, BMPR1A, BRCA1*, BRCA2*, BRIP1*, CDC73, CDH1*,CDK4, CDKN1B, CDKN2A, CHEK2*, CTNNA1, DICER1, FANCC, FH, FLCN, GALNT12, KIF1B, LZTR1, MAX, MEN1, MET, MLH1*, MSH2*, MSH3, MSH6*, MUTYH*, NBN, NF1*, NF2, NTHL1, PALB2*, PHOX2B, PMS2*, POT1, PRKAR1A, PTCH1, PTEN*, RAD51C*, RAD51D*,RB1, RECQL, RET, SDHA, SDHAF2, SDHB, SDHC, SDHD, SMAD4, SMARCA4, SMARCB1, SMARCE1, STK11, SUFU, TMEM127, TP53*,TSC1, TSC2, VHL and XRCC2 (sequencing and deletion/duplication); EGFR, EGLN1, HOXB13, KIT, MITF, PDGFRA, POLD1 and POLE (sequencing only); EPCAM and GREM1 (deletion/duplication only). ?  ? ? ?CHIEF COMPLIANT: Follow-up of right breast cancer ? ?INTERVAL HISTORY: Sandra Bishop is a 51 y.o. with above-mentioned history of invasive ductal carcinoma of the right breast. She presents to the clinic to discuss anastrozole. She complained of the burn from the radiation. She complains of pain and fatigue. ? ? ?ALLERGIES:  has No Known Allergies.  ? ?MEDICATIONS:  ?Current Outpatient Medications  ?Medication Sig Dispense Refill  ? glucose blood test strip Use to test your blood sugar    ? [START ON 11/28/2021] tamoxifen (NOLVADEX) 20 MG tablet Take 1 tablet (20 mg total) by mouth daily. 90 tablet 3  ? aspirin EC 81 MG tablet Take 81 mg by mouth daily. Swallow whole.    ? blood glucose meter kit and supplies Dispense based on patient and insurance preference. Use up to four times daily as directed. (FOR ICD-10 E10.9, E11.9). ? ?( One-Touch Verio meter) 1 each 0  ? Cholecalciferol (VITAMIN D3) 50 MCG (2000 UT) TABS Take 2,000 Units by mouth daily.    ? gabapentin (NEURONTIN) 100 MG capsule Take 100 mg by mouth 2 (two) times daily.    ? Lancets (ONETOUCH ULTRASOFT) lancets Use to check blood glucose three times  daily. Use as instructed. E11.9. 100 each 12  ? magnesium oxide (MAG-OX) 400 (240 Mg) MG tablet Take 1 tablet (400 mg total) by mouth daily. 30 tablet 2  ? ONETOUCH  VERIO test strip TEST FOUR TIMES DAILY AS DIRECTED 100 strip 2  ? rosuvastatin (CRESTOR) 5 MG tablet Take 5 mg by mouth every Tuesday.    ? RYBELSUS 7 MG TABS Take 7 mg by mouth every morning.    ? ?No current facility-administered medications for this visit.  ? ? ?PHYSICAL EXAMINATION: ?ECOG PERFORMANCE STATUS: 1 - Symptomatic but completely ambulatory ? ?Vitals:  ? 10/31/21 0927  ?BP: (!) 142/88  ?Pulse: (!) 108  ?Resp: 18  ?Temp: (!) 97.2 ?F (36.2 ?C)  ?SpO2: 96%  ? ?Filed Weights  ? 10/31/21 0927  ?Weight: 224 lb 14.4 oz (102 kg)  ? ?  ? ?LABORATORY DATA:  ?I have reviewed the data as listed ? ?  Latest Ref Rng & Units 08/12/2021  ?  9:56 AM 07/08/2021  ?  7:53 AM 07/01/2021  ? 10:38 AM  ?CMP  ?Glucose 70 - 99 mg/dL 154   224   187    ?BUN 6 - 20 mg/dL _0 ?Creatinine 0.44 - 1.00 mg/dL 0.92   0.78   0.81    ?Sodium 135 - 145 mmol/L 135   137   139    ?Potassium 3.5 - 5.1 mmol/L 3.9   4.0   4.1    ?Chloride 98 - 111 mmol/L 104   105   106    ?CO2 22 - 32 mmol/L _1 ?Calcium 8.9 - 10.3 mg/dL 9.3   9.2   8.9    ?Total Protein 6.5 - 8.1 g/dL  7.6   7.9    ?Total Bilirubin 0.3 - 1.2 mg/dL  0.3   0.3    ?Alkaline Phos 38 - 126 U/L  76   90    ?AST 15 - 41 U/L  19   15    ?ALT 0 - 44 U/L  19   22    ? ? ?Lab Results  ?Component Value Date  ? WBC 7.2 08/12/2021  ? HGB 11.8 (L) 08/12/2021  ? HCT 38.8 08/12/2021  ? MCV 90.0 08/12/2021  ? PLT 233 08/12/2021  ? NEUTROABS 3.2 07/08/2021  ? ? ?ASSESSMENT & PLAN:  ?Malignant neoplasm of upper-outer quadrant of right breast in female, estrogen receptor positive ([place]) ?02/15/2021:Screening mammogram: a possible mass in the right breast  Diagnostic mammogram and [place]: a persistent irregular mass 1.2 cm within the upper-outer right breast. 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  ?  ?Recommendation based on multidisciplinary tumor board: ?1. Neoadjuvant chemotherapy with Adriamycin and  Cytoxan dose dense ?4 followed by Taxol weekly ?9 (stopped early for AEs)  ?2. Followed by breast conserving surgery with  targeted axillary dissection: 08/05/2021: Pathologic complete response, 0/3 lymph nodes negative ?3. Followed by adjuvant radiation therapy 09/30/2021-11/07/2021 ?4.  Followed by adjuvant antiestrogen therapy (patient was premenopausal prior to chemo) ? ------------------------------------------------------------------------------------------------------------------------------------ ?Tamoxifen counseling: We discussed the risks and benefits of tamoxifen. These include but not limited to insomnia, hot flashes, mood changes, vaginal dryness, and weight gain. Although rare, serious side effects including endometrial cancer, risk of blood clots were also discussed. We strongly believe that the benefits far outweigh the risks. Patient  understands these risks and consented to starting treatment. Planned treatment duration is 2- 5 years followed by switching to aromatase inhibitor once she is menopausal ?She will start taking half a tablet daily for 2 weeks then increase to full tablet. ? ?Return to clinic in 3 months for survivorship care plan visit ? ?No orders of the defined types were placed in this encounter. ? ?The patient has a good understanding of the overall plan. she agrees with it. she will call with any problems that may develop before the next visit here. ?Total time spent: 30 mins including face to face time and time spent for planning, charting and co-ordination of care ? ? [name], MD ?10/31/21 ? ? ? I Gardiner Coins am scribing for Dr. Lindi Adie ? ?I have reviewed the above documentation for accuracy and completeness, and I agree with the above. ?. ?

## 2021-10-20 ENCOUNTER — Ambulatory Visit
Admission: RE | Admit: 2021-10-20 | Discharge: 2021-10-20 | Disposition: A | Discharge status: Home / Self Care | Payer: BC Managed Care – PPO | Source: Ambulatory Visit | Attending: Radiation Oncology | Admitting: Radiation Oncology

## 2021-10-20 DIAGNOSIS — C50411 Malignant neoplasm of upper-outer quadrant of right female breast: Secondary | ICD-10-CM | POA: Diagnosis not present

## 2021-10-21 ENCOUNTER — Other Ambulatory Visit: Payer: Self-pay

## 2021-10-21 ENCOUNTER — Ambulatory Visit
Admission: RE | Admit: 2021-10-21 | Discharge: 2021-10-21 | Disposition: A | Discharge status: Home / Self Care | Payer: BC Managed Care – PPO | Source: Ambulatory Visit | Attending: Radiation Oncology | Admitting: Radiation Oncology

## 2021-10-21 DIAGNOSIS — C50411 Malignant neoplasm of upper-outer quadrant of right female breast: Secondary | ICD-10-CM | POA: Diagnosis not present

## 2021-10-24 ENCOUNTER — Ambulatory Visit
Admission: RE | Admit: 2021-10-24 | Discharge: 2021-10-24 | Disposition: A | Discharge status: Home / Self Care | Payer: BC Managed Care – PPO | Source: Ambulatory Visit | Attending: Radiation Oncology | Admitting: Radiation Oncology

## 2021-10-24 ENCOUNTER — Other Ambulatory Visit: Payer: Self-pay

## 2021-10-24 ENCOUNTER — Telehealth: Payer: Self-pay | Admitting: *Deleted

## 2021-10-24 DIAGNOSIS — C50411 Malignant neoplasm of upper-outer quadrant of right female breast: Secondary | ICD-10-CM | POA: Diagnosis not present

## 2021-10-24 NOTE — Telephone Encounter (Signed)
Connected with Luz Brazen in lobby.  "Here to pick up form due today for disability which is due by the tenth of every month and is no longer allowed to be faxed."    ?Advised no new form received.  Last form completed 09/22/2021.   ?"I brought a form in 10/06/2021, gave it to receptionist."   ?Receptionist confirms receipt and form placed in silver pick-up bin behind registration area.  Collaborative nursing staff has not received form.   ?This nurse obtained PDF of [place] 703 form to complete.  Provider out of office through 10/31/2021.  Requested a breast clinic provider partner to sign monthly state disability form.         ? ?[employer] form successfully faxed to [phone] and e-mailed to [email or web address] and [email or web address].  Original copy to receptionist/Registration screening desk for pick up.  ?Connected with Luz Brazen to inform her of status and above information.   ?"What happened to form I brought in on 10/06/2021?  Advised form nurses nor collaborative received this form.  This nurse obtained PDF on-line to complete.  Hopefully this will help meet today's deadline if accepted per HR.   ?Currently no further questions or needs. ? ? ?

## 2021-10-25 ENCOUNTER — Ambulatory Visit: Payer: BC Managed Care – PPO | Admitting: Radiation Oncology

## 2021-10-26 ENCOUNTER — Ambulatory Visit
Admission: RE | Admit: 2021-10-26 | Discharge: 2021-10-26 | Disposition: A | Discharge status: Home / Self Care | Payer: BC Managed Care – PPO | Source: Ambulatory Visit | Attending: Radiation Oncology | Admitting: Radiation Oncology

## 2021-10-26 ENCOUNTER — Other Ambulatory Visit: Payer: Self-pay

## 2021-10-26 DIAGNOSIS — C50411 Malignant neoplasm of upper-outer quadrant of right female breast: Secondary | ICD-10-CM | POA: Diagnosis not present

## 2021-10-27 ENCOUNTER — Ambulatory Visit
Admission: RE | Admit: 2021-10-27 | Discharge: 2021-10-27 | Disposition: A | Discharge status: Home / Self Care | Payer: BC Managed Care – PPO | Source: Ambulatory Visit | Attending: Radiation Oncology | Admitting: Radiation Oncology

## 2021-10-27 DIAGNOSIS — C50411 Malignant neoplasm of upper-outer quadrant of right female breast: Secondary | ICD-10-CM | POA: Diagnosis not present

## 2021-10-28 ENCOUNTER — Ambulatory Visit
Admission: RE | Admit: 2021-10-28 | Discharge: 2021-10-28 | Disposition: A | Discharge status: Home / Self Care | Payer: BC Managed Care – PPO | Source: Ambulatory Visit | Attending: Radiation Oncology | Admitting: Radiation Oncology

## 2021-10-28 ENCOUNTER — Ambulatory Visit: Payer: BC Managed Care – PPO | Admitting: Radiation Oncology

## 2021-10-28 ENCOUNTER — Other Ambulatory Visit: Payer: Self-pay

## 2021-10-28 DIAGNOSIS — C50411 Malignant neoplasm of upper-outer quadrant of right female breast: Secondary | ICD-10-CM | POA: Diagnosis not present

## 2021-10-31 ENCOUNTER — Inpatient Hospital Stay (HOSPITAL_BASED_OUTPATIENT_CLINIC_OR_DEPARTMENT_OTHER): Payer: BC Managed Care – PPO | Admitting: Hematology and Oncology

## 2021-10-31 ENCOUNTER — Other Ambulatory Visit: Payer: Self-pay

## 2021-10-31 ENCOUNTER — Ambulatory Visit: Payer: BC Managed Care – PPO | Admitting: Radiation Oncology

## 2021-10-31 ENCOUNTER — Ambulatory Visit
Admission: RE | Admit: 2021-10-31 | Discharge: 2021-10-31 | Disposition: A | Discharge status: Home / Self Care | Payer: BC Managed Care – PPO | Source: Ambulatory Visit | Attending: Radiation Oncology | Admitting: Radiation Oncology

## 2021-10-31 DIAGNOSIS — Z170 Estrogen receptor positive status [ER+]: Secondary | ICD-10-CM | POA: Insufficient documentation

## 2021-10-31 DIAGNOSIS — Z9221 Personal history of antineoplastic chemotherapy: Secondary | ICD-10-CM | POA: Insufficient documentation

## 2021-10-31 DIAGNOSIS — C50411 Malignant neoplasm of upper-outer quadrant of right female breast: Secondary | ICD-10-CM | POA: Insufficient documentation

## 2021-10-31 DIAGNOSIS — Z923 Personal history of irradiation: Secondary | ICD-10-CM | POA: Insufficient documentation

## 2021-10-31 DIAGNOSIS — Z79810 Long term (current) use of selective estrogen receptor modulators (SERMs): Secondary | ICD-10-CM | POA: Insufficient documentation

## 2021-10-31 MED ORDER — TAMOXIFEN CITRATE 20 MG PO TABS: 20.0000 mg | ORAL_TABLET | Freq: Every day | ORAL | 3 refills | Status: DC

## 2021-10-31 NOTE — Assessment & Plan Note (Signed)
02/15/2021:Screening mammogram: a possible mass in the right breast ?Diagnostic mammogram and [place]: a persistent irregular mass 1.2 cm within the upper-outer right breast. 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 ?? ?Recommendation?based on multidisciplinary tumor board: ?1. Neoadjuvant chemotherapy with Adriamycin and Cytoxan dose dense ?4 followed by Taxol weekly ?9 (stopped early for AEs)  ?2. Followed by breast conserving surgery with ?targeted axillary dissection: 08/05/2021: Pathologic complete response, 0/3 lymph nodes negative ?3. Followed by adjuvant radiation therapy 09/30/2021-11/07/2021 ?4.??Followed by adjuvant antiestrogen therapy (patient was premenopausal prior to chemo) ??------------------------------------------------------------------------------------------------------------------------------------ ?Tamoxifen counseling: We discussed the risks and benefits of tamoxifen. These include but not limited to insomnia, hot flashes, mood changes, vaginal dryness, and weight gain. Although rare, serious side effects including endometrial cancer, risk of blood clots were also discussed. We strongly believe that the benefits far outweigh the risks. Patient understands these risks and consented to starting treatment. Planned treatment duration is 2- 5 years followed by switching to aromatase inhibitor once she is menopausal ? ?Return to clinic in 3 months for survivorship care plan visit ? ? ?

## 2021-11-01 ENCOUNTER — Ambulatory Visit
Admission: RE | Admit: 2021-11-01 | Discharge: 2021-11-01 | Disposition: A | Discharge status: Home / Self Care | Payer: BC Managed Care – PPO | Source: Ambulatory Visit | Attending: Radiation Oncology | Admitting: Radiation Oncology

## 2021-11-01 ENCOUNTER — Telehealth: Payer: Self-pay | Admitting: Hematology and Oncology

## 2021-11-01 ENCOUNTER — Ambulatory Visit: Payer: BC Managed Care – PPO | Admitting: Radiation Oncology

## 2021-11-01 ENCOUNTER — Other Ambulatory Visit: Payer: Self-pay

## 2021-11-01 ENCOUNTER — Ambulatory Visit: Payer: BC Managed Care – PPO | Attending: Hematology and Oncology

## 2021-11-01 DIAGNOSIS — M25611 Stiffness of right shoulder, not elsewhere classified: Secondary | ICD-10-CM | POA: Diagnosis present

## 2021-11-01 DIAGNOSIS — R600 Localized edema: Secondary | ICD-10-CM | POA: Insufficient documentation

## 2021-11-01 DIAGNOSIS — R293 Abnormal posture: Secondary | ICD-10-CM | POA: Diagnosis present

## 2021-11-01 DIAGNOSIS — C50411 Malignant neoplasm of upper-outer quadrant of right female breast: Secondary | ICD-10-CM | POA: Diagnosis present

## 2021-11-01 DIAGNOSIS — Z170 Estrogen receptor positive status [ER+]: Secondary | ICD-10-CM | POA: Diagnosis present

## 2021-11-01 LAB — RAD ONC ARIA SESSION SUMMARY
Course Elapsed Days: 42
Plan Fractions Treated to Date: 1
Plan Prescribed Dose Per Fraction: 2 Gy
Plan Total Fractions Prescribed: 5
Plan Total Prescribed Dose: 10 Gy
Reference Point Dosage Given to Date: 52.4 Gy
Reference Point Session Dosage Given: 2 Gy
Session Number: 29

## 2021-11-01 NOTE — Therapy (Signed)
[place] [facility] ?[place], [place], [address] ?Phone: [phone]   Fax:  [phone] ? ?Physical Therapy Treatment ? ?Patient Details  ?Name: Sandra Bishop ?MRN: [id number] ?Date of Birth: February 02, 1971 ?Referring Provider (PT): Dr. Lindi Adie ? ? ?Encounter Date: 11/01/2021 ? ? PT End of Session - 11/01/21 1046   ? ? Visit Number 9   ? Number of Visits 21   ? Date for PT Re-Evaluation 12/13/21   ? PT Start Time 4854   ? PT Stop Time 1043   ? PT Time Calculation (min) 46 min   ? Activity Tolerance Patient tolerated treatment well   ? Behavior During Therapy [facility] for tasks assessed/performed   ? ?  ?  ? ?  ? ? ?Past Medical History:  ?Diagnosis Date  ? Allergy   ? Anemia   ? Breast cancer ([place])   ? Right  ? Constipation   ? Diabetes mellitus without complication ([place])   ? Family history of adverse reaction to anesthesia   ? Aunt is hard to wake up from general anesthesia  ? Family history of breast cancer   ? Family history of colon cancer   ? Family history of lung cancer   ? Family history of pancreatic cancer   ? History of migraine   ? during pregancy  ? Obesity   ? Palpitations   ? ? ?Past Surgical History:  ?Procedure Laterality Date  ? BREAST BIOPSY Right 02/11/2021  ? x2  ? BREAST BIOPSY Right 03/22/2021  ? x3  ? BREAST LUMPECTOMY WITH RADIOACTIVE SEED AND SENTINEL LYMPH NODE BIOPSY Right 08/17/2021  ? Procedure: RIGHT BREAST LUMPECTOMY WITH RADIOACTIVE SEED AND SENTINEL LYMPH NODE BIOPSY;  Surgeon: [name], MD;  Location: [place];  Service: General;  Laterality: Right;  ? EYE SURGERY Right   ? PORT-A-CATH REMOVAL N/A 08/17/2021  ? Procedure: REMOVAL PORT-A-CATH;  Surgeon: [name], MD;  Location: [place];  Service: General;  Laterality: N/A;  ? PORTACATH PLACEMENT N/A 03/02/2021  ? Procedure: INSERTION PORT-A-CATH;  Surgeon: [name], MD;  Location: [facility];  Service: General;  Laterality: N/A;  ? RADIOACTIVE SEED GUIDED AXILLARY SENTINEL  LYMPH NODE Right 08/17/2021  ? Procedure: RADIOACTIVE SEED GUIDED RIGHT SENTINEL LYMPH NODE EXCISION;  Surgeon: [name], MD;  Location: [facility];  Service: General;  Laterality: Right;  ? RADIOACTIVE SEED GUIDED EXCISIONAL BREAST BIOPSY Left 08/17/2021  ? Procedure: RADIOACTIVE SEED GUIDED EXCISIONAL LEFT BREAST BIOPSY;  Surgeon: [name], MD;  Location: [place];  Service: General;  Laterality: Left;  ? TUBAL LIGATION    ? ? ?There were no vitals filed for this visit. ? ? Subjective Assessment - 11/01/21 0956   ? ? Subjective I saw the Doctor last Friday and started a new med for my blood sugar (Jardiance).  I feel better now.  I had my boost for radiation today and Monday is my last day. RAdiation is going OK.  My cording is still under my arm but not as bad around the elbow.  Pain is improved by 60%.  My breast is sore and under my arm from radiation. My breast still swells. I wore the compression bra up until radiation.   ? Pertinent History Pts.Screening mammogram determined  mass in the right breast  Diagnostic mammogram and [place]: a persistent irregular mass 1.2 cm within the upper-outer right breast. 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 Pt has finished neoadjuvant chemo and had right Lumpectomy with SLNB on 08/17/2021 with 0/3 LN's. She will have radiation and anti-estrogens   ? Patient Stated Goals decrease pain, improve ROM   ? Currently in Pain? Yes   ? Pain Score 5    ? Pain Location Breast   ? Pain Orientation Right   ? Pain Descriptors / Indicators Throbbing;Shooting   ? Pain Type Other (Comment)   radiation pain  ? Pain Onset 1 to 4 weeks ago   ? Pain Frequency Constant   ? Aggravating Factors  radiation   ? Pain Relieving Factors nothing, maybe gabapentin   ? Multiple Pain Sites No   ? ?  ?  ? ?  ? ? ? ? ? [facility] PT Assessment - 11/01/21 0001   ? ?  ? Assessment  ? Medical Diagnosis Right breast Cancer   ? Referring Provider  (PT) Dr. Lindi Adie   ? Hand Dominance Right   ?  ? Precautions  ? Precaution Comments lymphedema risk   ?  ? Observation/Other Assessments  ? Observations significant skin darkening right axillary region, right lateral neck and under right breast. Some darkening noted under breast.  Swelling noted lateral breast near incision and prox to nipple.  Skin very sensitive,   ?  ? AROM  ? Right Shoulder Extension 50 Degrees   ? Right Shoulder Flexion 155 Degrees   ? Right Shoulder ABduction 168 Degrees   ? Right Shoulder Internal Rotation 64 Degrees   ? Right Shoulder External Rotation 103 Degrees   ? ?  ?  ? ?  ? ? ? ? LYMPHEDEMA/ONCOLOGY QUESTIONNAIRE - 11/01/21 0001   ? ?  ? Type  ? Cancer Type Right breast CA.   ?  ? Surgeries  ? Lumpectomy Date 08/17/21   ? Sentinel Lymph Node Biopsy Date 08/17/21   ?  ? Right Upper Extremity Lymphedema  ? 10 cm Proximal to Olecranon Process 38 cm   ? Olecranon Process 27 cm   ? 10 cm Proximal to Ulnar Styloid Process 23.1 cm   ? Just Proximal to Ulnar Styloid Process 17.6 cm   ? At [facility] of 2nd Digit 6.5 cm   ?  ? Left Upper Extremity Lymphedema  ? 10 cm Proximal to Olecranon Process 38.9 cm   ? Olecranon Process 28 cm   ? 10 cm Proximal to Ulnar Styloid Process 23.3 cm   ? Just Proximal to Ulnar Styloid Process 17.7 cm   ? At [facility] of 2nd Digit 6.4 cm   ? ?  ?  ? ?  ? ? ? ? ? ? ? ? ? ? ? ? ? [name] Adult PT Treatment/Exercise - 11/01/21 0001   ? ?  ? Manual Therapy  ? Manual Therapy Myofascial release;Passive ROM   ? Myofascial Release MFR to cording at right upper arm and forearm. Not done at axilla secondary to irritation from radiation   ? Passive ROM PROM right shoulder flexion and abd in supine   ? ?  ?  ? ?  ? ? ? ? ? ? ? ? ? ? ? ? ? ? ? PT Long Term Goals - 11/01/21 1015   ? ?  ? PT LONG TERM GOAL #1  ? Title Pt will regain right shoulder AROM within 5-10 degrees of baseline for ability to peform home and work activities   ? Time 6   ? Period Weeks   ?  Status Achieved   ? Target  Date 11/01/21   ?  ? PT LONG TERM GOAL #2  ? Title Pain and tightness from cording decreased atleast 50%   ? Baseline 60% better   ? Time 6   ? Period Weeks   ? Status Achieved   ? Target Date 11/01/21   ?  ? PT LONG TERM GOAL #3  ? Title Pt will attend ABC and will have a good understanding of skin care and precautions for lymphedema   ? Baseline not yet   ? Time 6   ? Period Weeks   ? Status On-going   ? Target Date 12/13/21   ?  ? PT LONG TERM GOAL #4  ? Title Pt will  purchase compression bra and will have 50% reduction in edema   ? Baseline has bra;continues with swelling icreased since radiation   ? Time 6   ? Period Weeks   ? Status On-going   ? Target Date 12/13/21   ?  ? PT LONG TERM GOAL #5  ? Title Pt will be educated and compliant with MLD as need for right breast swelling   ? Time 6   ? Period Weeks   ? Status On-going   ? Target Date 12/13/21   ? ?  ?  ? ?  ? ? ? ? ? ? ? ? Plan - 11/01/21 1047   ? ? Clinical Impression Statement Pt has not been seen in therapy for nearly 4 weeks due to illness and radiation.  Pt was reassessed today. She has achieved her right shoulder ROM goals and pain goal. She does continue with cording especially in the antecubital fossa region, and some in the upper arm. Axilla not checked today because of skin irritation from radiation. She has purchased a compression bra but is not presently able to wear it because of skin hypersensitivity from radiation. There is fibosis still present at breast incision and swelling still noted at lateral breast and proximal to nipple.  Darkening of skin noted at right lateral neck, axillary region/lateral trunk and inferior to right breast from radiation.  She will finish radiation next week. She will be re-scheduled for the ABC class secondary to forgetting to attend when last scheduled.  She requires MLD to the right breast for swelling and instruction in self MLD to the breast. We will schedule her to start at the end of the first week of  May to allow her skin to recover from radiation. There is no swelling in the right UE   ? Personal Factors and Comorbidities Comorbidity 2   ? Comorbidities Breast Cancer with 3 LN removed, chemo, pending ra

## 2021-11-01 NOTE — Telephone Encounter (Signed)
Scheduled appointment per 04/17 los. Patient aware.  ?

## 2021-11-02 ENCOUNTER — Ambulatory Visit: Payer: BC Managed Care – PPO | Admitting: Radiation Oncology

## 2021-11-02 ENCOUNTER — Other Ambulatory Visit: Payer: Self-pay

## 2021-11-03 ENCOUNTER — Other Ambulatory Visit: Payer: Self-pay

## 2021-11-03 ENCOUNTER — Ambulatory Visit: Payer: BC Managed Care – PPO | Admitting: Radiation Oncology

## 2021-11-03 ENCOUNTER — Encounter: Payer: Self-pay | Admitting: *Deleted

## 2021-11-03 ENCOUNTER — Ambulatory Visit
Admission: RE | Admit: 2021-11-03 | Discharge: 2021-11-03 | Disposition: A | Discharge status: Home / Self Care | Payer: BC Managed Care – PPO | Source: Ambulatory Visit | Attending: Radiation Oncology | Admitting: Radiation Oncology

## 2021-11-03 DIAGNOSIS — C50411 Malignant neoplasm of upper-outer quadrant of right female breast: Secondary | ICD-10-CM | POA: Diagnosis not present

## 2021-11-03 DIAGNOSIS — Z170 Estrogen receptor positive status [ER+]: Secondary | ICD-10-CM

## 2021-11-03 LAB — RAD ONC ARIA SESSION SUMMARY
Course Elapsed Days: 44
Plan Fractions Treated to Date: 2
Plan Prescribed Dose Per Fraction: 2 Gy
Plan Total Fractions Prescribed: 5
Plan Total Prescribed Dose: 10 Gy
Reference Point Dosage Given to Date: 54.4 Gy
Reference Point Session Dosage Given: 2 Gy
Session Number: 30

## 2021-11-04 ENCOUNTER — Other Ambulatory Visit: Payer: Self-pay | Admitting: Radiation Oncology

## 2021-11-04 ENCOUNTER — Other Ambulatory Visit: Payer: Self-pay

## 2021-11-04 ENCOUNTER — Ambulatory Visit: Payer: BC Managed Care – PPO | Admitting: Radiation Oncology

## 2021-11-04 ENCOUNTER — Ambulatory Visit
Admission: RE | Admit: 2021-11-04 | Discharge: 2021-11-04 | Disposition: A | Discharge status: Home / Self Care | Payer: BC Managed Care – PPO | Source: Ambulatory Visit | Attending: Radiation Oncology | Admitting: Radiation Oncology

## 2021-11-04 DIAGNOSIS — C50411 Malignant neoplasm of upper-outer quadrant of right female breast: Secondary | ICD-10-CM | POA: Diagnosis not present

## 2021-11-04 LAB — RAD ONC ARIA SESSION SUMMARY
Course Elapsed Days: 45
Plan Fractions Treated to Date: 3
Plan Prescribed Dose Per Fraction: 2 Gy
Plan Total Fractions Prescribed: 5
Plan Total Prescribed Dose: 10 Gy
Reference Point Dosage Given to Date: 56.4 Gy
Reference Point Session Dosage Given: 2 Gy
Session Number: 31

## 2021-11-04 MED ORDER — HYDROMORPHONE HCL 4 MG PO TABS: 4.0000 mg | ORAL_TABLET | Freq: Four times a day (QID) | ORAL | 0 refills | Status: DC | PRN

## 2021-11-07 ENCOUNTER — Other Ambulatory Visit: Payer: Self-pay

## 2021-11-07 ENCOUNTER — Ambulatory Visit: Payer: BC Managed Care – PPO | Admitting: Radiation Oncology

## 2021-11-07 ENCOUNTER — Ambulatory Visit
Admission: RE | Admit: 2021-11-07 | Discharge: 2021-11-07 | Disposition: A | Discharge status: Home / Self Care | Payer: BC Managed Care – PPO | Source: Ambulatory Visit | Attending: Radiation Oncology | Admitting: Radiation Oncology

## 2021-11-07 DIAGNOSIS — C50411 Malignant neoplasm of upper-outer quadrant of right female breast: Secondary | ICD-10-CM | POA: Diagnosis not present

## 2021-11-07 LAB — RAD ONC ARIA SESSION SUMMARY
Course Elapsed Days: 48
Plan Fractions Treated to Date: 4
Plan Prescribed Dose Per Fraction: 2 Gy
Plan Total Fractions Prescribed: 5
Plan Total Prescribed Dose: 10 Gy
Reference Point Dosage Given to Date: 58.4 Gy
Reference Point Session Dosage Given: 2 Gy
Session Number: 32

## 2021-11-08 ENCOUNTER — Encounter: Payer: Self-pay | Admitting: Radiation Oncology

## 2021-11-08 ENCOUNTER — Other Ambulatory Visit: Payer: Self-pay

## 2021-11-08 ENCOUNTER — Ambulatory Visit
Admission: RE | Admit: 2021-11-08 | Discharge: 2021-11-08 | Disposition: A | Discharge status: Home / Self Care | Payer: BC Managed Care – PPO | Source: Ambulatory Visit | Attending: Radiation Oncology | Admitting: Radiation Oncology

## 2021-11-08 DIAGNOSIS — C50411 Malignant neoplasm of upper-outer quadrant of right female breast: Secondary | ICD-10-CM | POA: Diagnosis not present

## 2021-11-08 LAB — RAD ONC ARIA SESSION SUMMARY
Course Elapsed Days: 49
Plan Fractions Treated to Date: 5
Plan Prescribed Dose Per Fraction: 2 Gy
Plan Total Fractions Prescribed: 5
Plan Total Prescribed Dose: 10 Gy
Reference Point Dosage Given to Date: 60.4 Gy
Reference Point Session Dosage Given: 2 Gy
Session Number: 33

## 2021-11-16 ENCOUNTER — Telehealth: Payer: Self-pay

## 2021-11-16 NOTE — Telephone Encounter (Signed)
Notified Patient of completion of form 78. Fax transmission confirmation received. Copy of form mailed to Patient as requested. No other needs or concerns voiced at this time. ?

## 2021-11-21 ENCOUNTER — Ambulatory Visit: Payer: Self-pay

## 2021-11-22 ENCOUNTER — Ambulatory Visit: Payer: BC Managed Care – PPO | Attending: Hematology and Oncology

## 2021-11-22 DIAGNOSIS — R293 Abnormal posture: Secondary | ICD-10-CM | POA: Diagnosis present

## 2021-11-22 DIAGNOSIS — Z170 Estrogen receptor positive status [ER+]: Secondary | ICD-10-CM | POA: Insufficient documentation

## 2021-11-22 DIAGNOSIS — C50411 Malignant neoplasm of upper-outer quadrant of right female breast: Secondary | ICD-10-CM | POA: Diagnosis present

## 2021-11-22 DIAGNOSIS — M25611 Stiffness of right shoulder, not elsewhere classified: Secondary | ICD-10-CM | POA: Insufficient documentation

## 2021-11-22 DIAGNOSIS — R600 Localized edema: Secondary | ICD-10-CM | POA: Diagnosis present

## 2021-11-22 NOTE — Therapy (Signed)
[place] ?[place], [place], [address] ?Phone: [phone]   Fax:  [phone] ? ?Physical Therapy Treatment ? ?Patient Details  ?Name: Sandra Bishop ?MRN: [id number] ?Date of Birth: Nov 30, 1970 ?Referring Provider (PT): Dr. Lindi Adie ? ? ?Encounter Date: 11/22/2021 ? ? PT End of Session - 11/22/21 1001   ? ? Visit Number 10   ? Number of Visits 21   ? Date for PT Re-Evaluation 12/13/21   ? PT Start Time 1001   ? PT Stop Time 1055   ? PT Time Calculation (min) 54 min   ? Activity Tolerance Patient tolerated treatment well   ? Behavior During Therapy [facility] for tasks assessed/performed   ? ?  ?  ? ?  ? ? ?Past Medical History:  ?Diagnosis Date  ? Allergy   ? Anemia   ? Breast cancer ([place])   ? Right  ? Constipation   ? Diabetes mellitus without complication ([place])   ? Family history of adverse reaction to anesthesia   ? Aunt is hard to wake up from general anesthesia  ? Family history of breast cancer   ? Family history of colon cancer   ? Family history of lung cancer   ? Family history of pancreatic cancer   ? History of migraine   ? during pregancy  ? Obesity   ? Palpitations   ? ? ?Past Surgical History:  ?Procedure Laterality Date  ? BREAST BIOPSY Right 02/11/2021  ? x2  ? BREAST BIOPSY Right 03/22/2021  ? x3  ? BREAST LUMPECTOMY WITH RADIOACTIVE SEED AND SENTINEL LYMPH NODE BIOPSY Right 08/17/2021  ? Procedure: RIGHT BREAST LUMPECTOMY WITH RADIOACTIVE SEED AND SENTINEL LYMPH NODE BIOPSY;  Surgeon: [name], MD;  Location: [facility];  Service: General;  Laterality: Right;  ? EYE SURGERY Right   ? PORT-A-CATH REMOVAL N/A 08/17/2021  ? Procedure: REMOVAL PORT-A-CATH;  Surgeon: [name], MD;  Location: [facility];  Service: General;  Laterality: N/A;  ? PORTACATH PLACEMENT N/A 03/02/2021  ? Procedure: INSERTION PORT-A-CATH;  Surgeon: [name], MD;  [facility];  Service: General;  Laterality: N/A;  ? RADIOACTIVE SEED GUIDED AXILLARY SENTINEL  LYMPH NODE Right 08/17/2021  ? Procedure: RADIOACTIVE SEED GUIDED RIGHT SENTINEL LYMPH NODE EXCISION;  Surgeon: [name], MD;  Location: [facility];  Service: General;  Laterality: Right;  ? RADIOACTIVE SEED GUIDED EXCISIONAL BREAST BIOPSY Left 08/17/2021  ? Procedure: RADIOACTIVE SEED GUIDED EXCISIONAL LEFT BREAST BIOPSY;  Surgeon: [name], MD;  Location: [facility];  Service: General;  Laterality: Left;  ? TUBAL LIGATION    ? ? ?There were no vitals filed for this visit. ? ? Subjective Assessment - 11/22/21 1005   ? ? Subjective I am still real dark and burned from radiation but I am not sore. I think the cords are pretty much gone. I feel some hardness at the incision.  I have started back to the exercises. I will go back to work in August. Still feeling some breast swelling. The foam pad helped make the firm area smaller.   ? Pertinent History Pts.Screening mammogram determined  mass in the right breast  Diagnostic mammogram and [place]: a persistent irregular mass 1.2 cm within the upper-outer right breast. 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 Pt has finished neoadjuvant chemo and had right Lumpectomy with SLNB on 08/17/2021 with 0/3 LN's. She will have radiation  and anti-estrogens   ? Patient Stated Goals decrease pain, improve ROM   ? Currently in Pain? No/denies   ? Pain Score 0-No pain   ? Multiple Pain Sites No   ? ?  ?  ? ?  ? ? ? ? ? ? ? ? ? L-DEX FLOWSHEETS - 11/22/21 1000   ? ?  ? L-DEX LYMPHEDEMA SCREENING  ? Measurement Type Unilateral   ? L-DEX MEASUREMENT EXTREMITY Upper Extremity   ? POSITION  Standing   ? DOMINANT SIDE Right   ? At Risk Side Right   ? BASELINE SCORE (UNILATERAL) 0   ? L-DEX SCORE (UNILATERAL) -1   ? VALUE CHANGE (UNILAT) -1   ? ?  ?  ? ?  ? ? ? ? ? ? ? ? ? ? ? ? [facility] Adult PT Treatment/Exercise - 11/22/21 0001   ? ?  ? Manual Therapy  ? Manual Lymphatic Drainage (MLD) In Supine, head of table elevated: Short  neck, 5 diaphragmatic breaths, Lt axillary and Rt inguinal nodes, anterior inter-axillayr and Rt axillo-inguinal anastomosis, then focused on Rt breast, then finished retracing all steps in supine ending with LN. Pt practiced all steps after demonstration by PT.   ? ?  ?  ? ?  ? ? ? ? ? ? ? ? ? ? ? ? ? ? ? PT Long Term Goals - 11/01/21 1015   ? ?  ? PT LONG TERM GOAL #1  ? Title Pt will regain right shoulder AROM within 5-10 degrees of baseline for ability to peform home and work activities   ? Time 6   ? Period Weeks   ? Status Achieved   ? Target Date 11/01/21   ?  ? PT LONG TERM GOAL #2  ? Title Pain and tightness from cording decreased atleast 50%   ? Baseline 60% better   ? Time 6   ? Period Weeks   ? Status Achieved   ? Target Date 11/01/21   ?  ? PT LONG TERM GOAL #3  ? Title Pt will attend ABC and will have a good understanding of skin care and precautions for lymphedema   ? Baseline not yet   ? Time 6   ? Period Weeks   ? Status On-going   ? Target Date 12/13/21   ?  ? PT LONG TERM GOAL #4  ? Title Pt will  purchase compression bra and will have 50% reduction in edema   ? Baseline has bra;continues with swelling icreased since radiation   ? Time 6   ? Period Weeks   ? Status On-going   ? Target Date 12/13/21   ?  ? PT LONG TERM GOAL #5  ? Title Pt will be educated and compliant with MLD as need for right breast swelling   ? Time 6   ? Period Weeks   ? Status On-going   ? Target Date 12/13/21   ? ?  ?  ? ?  ? ? ? ? ? ? ? ? Plan - 11/22/21 1058   ? ? Clinical Impression Statement Pt notes overall improved cording, and her skin is healing from her radiation. We reviewed right breast MLD with PT performing most and then pt demonstrating. She did requires occasional VC's and TC's for proper stretch and pressure but has an improved understanding. Also performed her SOZO screen which is well within normal limits. Pt encouraged to try and wear her compression bra more often  to decrease edema   ? Personal Factors  and Comorbidities Comorbidity 2   ? Comorbidities Breast Cancer with 3 LN removed, chemo, pending radiation, DM   ? Examination-Activity Limitations Reach Overhead;Dressing;Lift   ? Examination-Participation Restrictions Occupation   ? Stability/Clinical Decision Making Stable/Uncomplicated   ? Rehab Potential Excellent   ? PT Frequency 2x / week   ? PT Duration 6 weeks   ? PT Treatment/Interventions Therapeutic exercise;Patient/family education;Orthotic Fit/Training;Manual techniques;Passive range of motion;Scar mobilization;Manual lymph drainage;Vasopneumatic Device   ? PT Next Visit Plan Did foam pad help? Cont MLD to right breast, and review this with pt, MFR to cording, PROM, scar mobilization to SLNB scar, ?pulleys, ,   ? PT Home Exercise Plan 4 post op exercises, wall chest stretch with wrist oscillations; self MLD to Rt breast   ? Consulted and Agree with Plan of Care Patient   ? ?  ?  ? ?  ? ? ?Patient will benefit from skilled therapeutic intervention in order to improve the following deficits and impairments:  Decreased range of motion, Increased fascial restricitons, Impaired UE functional use, Decreased activity tolerance, Decreased knowledge of precautions, Pain, Decreased scar mobility, Impaired flexibility, Decreased strength, Increased edema, Postural dysfunction ? ?Visit Diagnosis: ?Malignant neoplasm of upper-outer quadrant of right breast in female, estrogen receptor positive ([place]) ? ?Abnormal posture ? ?Stiffness of right shoulder, not elsewhere classified ? ?Localized edema ? ? ? ? ?Problem List ?Patient Active Problem List  ? Diagnosis Date Noted  ? Hyperglycemia due to type 2 diabetes mellitus ([place]) 07/08/2021  ? Pure hypercholesterolemia 07/08/2021  ? Morbid obesity ([place]) 07/08/2021  ? Chemotherapy-induced peripheral neuropathy ([place]) 07/08/2021  ? Genetic testing 03/17/2021  ? Port-A-Cath in place 03/03/2021  ? Family history of breast cancer 02/24/2021  ? Family history of pancreatic cancer  02/24/2021  ? Family history of colon cancer 02/24/2021  ? Family history of lung cancer 02/24/2021  ? Malignant neoplasm of upper-outer quadrant of right breast in female, estrogen receptor positive ([place])

## 2021-11-22 NOTE — Progress Notes (Signed)
? ?                                                                                                                                                          ?  Patient Name: Sandra Bishop ?MRN: [id number] ?DOB: January 13, 1971 ?Referring Physician: Sela Hilding ?Date of Service: 11/08/2021 ?[facility] [place] ? ?                                                      End Of Treatment Note ? ?Diagnoses: C50.411-Malignant neoplasm of upper-outer quadrant of right female breast ? ?Cancer Staging:   Stage IIB, cT1cN1M0, grade 3 ER positive invasive ductal carcinoma of the right breast with complete pathologic response following neoadjuvant chemotherapy. ? ?Intent: Curative ? ?Radiation Treatment Dates: 09/20/2021 through 11/08/2021 ?Site Technique Total Dose (Gy) Dose per Fx (Gy) Completed Fx Beam Energies  ?Breast, Right: Breast_R 3D 50.4/50.4 1.8 28/28 10X  ?Breast, Right: Breast_R_SCLV 3D 50.4/50.4 1.8 28/28 6X, 10X  ?Breast, Right: Breast_R_Bst 3D 10/10 2 5/5 6X, 10X  ? ?Narrative: The patient tolerated radiation therapy but developed fatigue and anticipated skin changes in the treatment field. In addition she did have tenderness that did not respond to Neurontin or tylenol during therapy. She did work with PT but this had to be limited somewhat due to her discomfort. ? ?Plan: The patient will receive a call in about one month from the radiation oncology department. He will continue follow up with Dr. Lindi Adie as well.  ? ?________________________________________________ ? ? ? ?Carola Rhine, PAC  ?

## 2021-11-24 ENCOUNTER — Ambulatory Visit: Payer: BC Managed Care – PPO

## 2021-11-29 ENCOUNTER — Ambulatory Visit: Payer: BC Managed Care – PPO

## 2021-11-29 DIAGNOSIS — R600 Localized edema: Secondary | ICD-10-CM

## 2021-11-29 DIAGNOSIS — C50411 Malignant neoplasm of upper-outer quadrant of right female breast: Secondary | ICD-10-CM

## 2021-11-29 DIAGNOSIS — M25611 Stiffness of right shoulder, not elsewhere classified: Secondary | ICD-10-CM

## 2021-11-29 DIAGNOSIS — R293 Abnormal posture: Secondary | ICD-10-CM

## 2021-11-29 DIAGNOSIS — Z170 Estrogen receptor positive status [ER+]: Secondary | ICD-10-CM

## 2021-11-29 NOTE — Therapy (Signed)
[facility] ?[facility] ?[place], [place], [address] ?Phone: [phone]   Fax:  [phone] ? ?Physical Therapy Treatment ? ?Patient Details  ?Name: Sandra Bishop ?MRN: [id number] ?Date of Birth: 15-Apr-1971 ?Referring Provider (PT): Dr. Lindi Adie ? ? ?Encounter Date: 11/29/2021 ? ? PT End of Session - 11/29/21 1007   ? ? Visit Number 11   ? Number of Visits 21   ? Date for PT Re-Evaluation 12/13/21   ? PT Start Time 1005   ? PT Stop Time 1054   ? PT Time Calculation (min) 49 min   ? Activity Tolerance Patient tolerated treatment well   ? Behavior During Therapy [facility] for tasks assessed/performed   ? ?  ?  ? ?  ? ? ?Past Medical History:  ?Diagnosis Date  ? Allergy   ? Anemia   ? Breast cancer ([place])   ? Right  ? Constipation   ? Diabetes mellitus without complication ([place])   ? Family history of adverse reaction to anesthesia   ? Aunt is hard to wake up from general anesthesia  ? Family history of breast cancer   ? Family history of colon cancer   ? Family history of lung cancer   ? Family history of pancreatic cancer   ? History of migraine   ? during pregancy  ? Obesity   ? Palpitations   ? ? ?Past Surgical History:  ?Procedure Laterality Date  ? BREAST BIOPSY Right 02/11/2021  ? x2  ? BREAST BIOPSY Right 03/22/2021  ? x3  ? BREAST LUMPECTOMY WITH RADIOACTIVE SEED AND SENTINEL LYMPH NODE BIOPSY Right 08/17/2021  ? Procedure: RIGHT BREAST LUMPECTOMY WITH RADIOACTIVE SEED AND SENTINEL LYMPH NODE BIOPSY;  Surgeon: [name], MD;  Location: [facility];  Service: General;  Laterality: Right;  ? EYE SURGERY Right   ? PORT-A-CATH REMOVAL N/A 08/17/2021  ? Procedure: REMOVAL PORT-A-CATH;  Surgeon: [name], MD;  Location: [facility];  Service: General;  Laterality: N/A;  ? PORTACATH PLACEMENT N/A 03/02/2021  ? Procedure: INSERTION PORT-A-CATH;  Surgeon: [name], MD;  [facility];  Service: General;  Laterality: N/A;  ? RADIOACTIVE SEED GUIDED AXILLARY SENTINEL  LYMPH NODE Right 08/17/2021  ? Procedure: RADIOACTIVE SEED GUIDED RIGHT SENTINEL LYMPH NODE EXCISION;  Surgeon: [name], MD;  Location: [facility];  Service: General;  Laterality: Right;  ? RADIOACTIVE SEED GUIDED EXCISIONAL BREAST BIOPSY Left 08/17/2021  ? Procedure: RADIOACTIVE SEED GUIDED EXCISIONAL LEFT BREAST BIOPSY;  Surgeon: [name], MD;  Location: [facility];  Service: General;  Laterality: Left;  ? TUBAL LIGATION    ? ? ?There were no vitals filed for this visit. ? ? Subjective Assessment - 11/29/21 1002   ? ? Subjective My left thumb has been hurting for a week. It hurts to bend it and its like a toothe ache. The cording on the right feels good, and I have been doing the MLD. Last Wednesday after being here I was sore. I think its the nerves trying to come back. I started a new chemo pill last night(Tamoxifen)   ? Pertinent History Pts.Screening mammogram determined  mass in the right breast  Diagnostic mammogram and [place]: a persistent irregular mass 1.2 cm within the upper-outer right breast. 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 Pt has finished neoadjuvant chemo and had right Lumpectomy with SLNB on 08/17/2021 with 0/3 LN's. She will have radiation  and anti-estrogens   ? Patient Stated Goals decrease pain, improve ROM   ? Currently in Pain? Yes   ? Pain Score 7    ? Pain Location --   thumb  ? Pain Orientation Left   ? Pain Descriptors / Indicators Aching;Tender   ? Pain Type Acute pain   ? Pain Onset In the past 7 days   ? Pain Frequency Constant   ? Aggravating Factors  moving thumb   ? Pain Relieving Factors nothing   ? Multiple Pain Sites No   ? ?  ?  ? ?  ? ? ? ? ? ? ? ? ? ? ? ? ? ? ? ? ? ? ? ? [place] Adult PT Treatment/Exercise - 11/29/21 0001   ? ?  ? Manual Therapy  ? Myofascial Release MFR to cording right forearm. No real stretch felt by pt.   ? Manual Lymphatic Drainage (MLD) In Supine, head of table elevated: Short neck,  5 diaphragmatic breaths, Lt axillary and Rt inguinal nodes, anterior inter-axillayr and Rt axillo-inguinal anastomosis, then focused on Rt breast, then finished retracing all steps in supine ending with LN. Pt did not practice steps secondary to new onset of left thumb pain. Will see MD today.   ? Passive ROM PROM right shoulder flexion and abd in supine   ? ?  ?  ? ?  ? ? ? ? ? ? ? ? ? ? ? ? ? ? ? PT Long Term Goals - 11/01/21 1015   ? ?  ? PT LONG TERM GOAL #1  ? Title Pt will regain right shoulder AROM within 5-10 degrees of baseline for ability to peform home and work activities   ? Time 6   ? Period Weeks   ? Status Achieved   ? Target Date 11/01/21   ?  ? PT LONG TERM GOAL #2  ? Title Pain and tightness from cording decreased atleast 50%   ? Baseline 60% better   ? Time 6   ? Period Weeks   ? Status Achieved   ? Target Date 11/01/21   ?  ? PT LONG TERM GOAL #3  ? Title Pt will attend ABC and will have a good understanding of skin care and precautions for lymphedema   ? Baseline not yet   ? Time 6   ? Period Weeks   ? Status On-going   ? Target Date 12/13/21   ?  ? PT LONG TERM GOAL #4  ? Title Pt will  purchase compression bra and will have 50% reduction in edema   ? Baseline has bra;continues with swelling icreased since radiation   ? Time 6   ? Period Weeks   ? Status On-going   ? Target Date 12/13/21   ?  ? PT LONG TERM GOAL #5  ? Title Pt will be educated and compliant with MLD as need for right breast swelling   ? Time 6   ? Period Weeks   ? Status On-going   ? Target Date 12/13/21   ? ?  ?  ? ?  ? ? ? ? ? ? ? ? Plan - 11/29/21 1206   ? ? Clinical Impression Statement Cording is still palpable in forearm however, pt does not feel tightness from it anymore. Several small areas of firmness in right breast but overall feels improved. Did not have pt demonstrated MLD today as she has new onset of left thumb pain for which  she will see MD today.   ? Personal Factors and Comorbidities Comorbidity 2   ?  Comorbidities Breast Cancer with 3 LN removed, chemo, pending radiation, DM   ? Examination-Activity Limitations Reach Overhead;Dressing;Lift   ? Examination-Participation Restrictions Occupation   ? Stability/Clinical Decision Making Stable/Uncomplicated   ? Rehab Potential Excellent   ? PT Frequency 2x / week   ? PT Duration 6 weeks   ? PT Treatment/Interventions Therapeutic exercise;Patient/family education;Orthotic Fit/Training;Manual techniques;Passive range of motion;Scar mobilization;Manual lymph drainage;Vasopneumatic Device   ? PT Next Visit Plan How is Left Thumb,Continue MLD and pt instructions   ? PT Home Exercise Plan 4 post op exercises, wall chest stretch with wrist oscillations; self MLD to Rt breast   ? Consulted and Agree with Plan of Care Patient   ? ?  ?  ? ?  ? ? ?Patient will benefit from skilled therapeutic intervention in order to improve the following deficits and impairments:  Decreased range of motion, Increased fascial restricitons, Impaired UE functional use, Decreased activity tolerance, Decreased knowledge of precautions, Pain, Decreased scar mobility, Impaired flexibility, Decreased strength, Increased edema, Postural dysfunction ? ?Visit Diagnosis: ?Malignant neoplasm of upper-outer quadrant of right breast in female, estrogen receptor positive ([place]) ? ?Abnormal posture ? ?Stiffness of right shoulder, not elsewhere classified ? ?Localized edema ? ? ? ? ?Problem List ?Patient Active Problem List  ? Diagnosis Date Noted  ? Hyperglycemia due to type 2 diabetes mellitus ([place]) 07/08/2021  ? Pure hypercholesterolemia 07/08/2021  ? Morbid obesity ([place]) 07/08/2021  ? Chemotherapy-induced peripheral neuropathy ([place]) 07/08/2021  ? Genetic testing 03/17/2021  ? Port-A-Cath in place 03/03/2021  ? Family history of breast cancer 02/24/2021  ? Family history of pancreatic cancer 02/24/2021  ? Family history of colon cancer 02/24/2021  ? Family history of lung cancer 02/24/2021  ? Malignant  neoplasm of upper-outer quadrant of right breast in female, estrogen receptor positive ([place]) 02/15/2021  ? Obesity   ? Constipation 08/30/2011  ? ? ?Claris Pong, PT ?11/29/2021, 12:08 PM ? ?[place] ?[place]

## 2021-12-01 ENCOUNTER — Ambulatory Visit: Payer: BC Managed Care – PPO

## 2021-12-01 DIAGNOSIS — Z170 Estrogen receptor positive status [ER+]: Secondary | ICD-10-CM

## 2021-12-01 DIAGNOSIS — R293 Abnormal posture: Secondary | ICD-10-CM

## 2021-12-01 DIAGNOSIS — C50411 Malignant neoplasm of upper-outer quadrant of right female breast: Secondary | ICD-10-CM | POA: Diagnosis not present

## 2021-12-01 DIAGNOSIS — R600 Localized edema: Secondary | ICD-10-CM

## 2021-12-01 DIAGNOSIS — M25611 Stiffness of right shoulder, not elsewhere classified: Secondary | ICD-10-CM

## 2021-12-01 NOTE — Therapy (Signed)
[facility] @ [address] [place], [place], [address] Phone: [phone]   Fax:  [phone]  Physical Therapy Treatment  Patient Details  Name: Sandra Bishop MRN: [id number] Date of Birth: 08/16/1970 Referring Provider (PT): Dr. Lindi Adie   Encounter Date: 12/01/2021   PT End of Session - 12/01/21 1005     Visit Number 12    Number of Visits 21    Date for PT Re-Evaluation 12/13/21    PT Start Time 1005    PT Stop Time 1050    PT Time Calculation (min) 45 min    Activity Tolerance Patient tolerated treatment well    Behavior During Therapy [facility] for tasks assessed/performed             Past Medical History:  Diagnosis Date   Allergy    Anemia    Breast cancer ([place])    Right   Constipation    Diabetes mellitus without complication ([place])    Family history of adverse reaction to anesthesia    Aunt is hard to wake up from general anesthesia   Family history of breast cancer    Family history of colon cancer    Family history of lung cancer    Family history of pancreatic cancer    History of migraine    during pregancy   Obesity    Palpitations     Past Surgical History:  Procedure Laterality Date   BREAST BIOPSY Right 02/11/2021   x2   BREAST BIOPSY Right 03/22/2021   x3   BREAST LUMPECTOMY WITH RADIOACTIVE SEED AND SENTINEL LYMPH NODE BIOPSY Right 08/17/2021   Procedure: RIGHT BREAST LUMPECTOMY WITH RADIOACTIVE SEED AND SENTINEL LYMPH NODE BIOPSY;  Surgeon: [name], MD;  Location: [facility];  Service: General;  Laterality: Right;   EYE SURGERY Right    PORT-A-CATH REMOVAL N/A 08/17/2021   Procedure: REMOVAL PORT-A-CATH;  Surgeon: [name], MD;  Location: [facility];  Service: General;  Laterality: N/A;   PORTACATH PLACEMENT N/A 03/02/2021   Procedure: INSERTION PORT-A-CATH;  Surgeon: [name], MD;  [facility];  Service: General;  Laterality: N/A;   RADIOACTIVE SEED GUIDED AXILLARY SENTINEL  LYMPH NODE Right 08/17/2021   Procedure: RADIOACTIVE SEED GUIDED RIGHT SENTINEL LYMPH NODE EXCISION;  Surgeon: [name], MD;  Location: [facility];  Service: General;  Laterality: Right;   RADIOACTIVE SEED GUIDED EXCISIONAL BREAST BIOPSY Left 08/17/2021   Procedure: RADIOACTIVE SEED GUIDED EXCISIONAL LEFT BREAST BIOPSY;  Surgeon: [name], MD;  Location: [facility];  Service: General;  Laterality: Left;   TUBAL LIGATION      There were no vitals filed for this visit.   Subjective Assessment - 12/01/21 1005     Subjective I went to have my thumb checked. They XRayed but didn't tell me anything, just to take tylenol. It is just throbbing. Didn't get sore in the breast after last visit.    Pertinent History Pts.Screening mammogram determined  mass in the right breast  Diagnostic mammogram and [place]: a persistent irregular mass 1.2 cm within the upper-outer right breast. 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 Pt has finished neoadjuvant chemo and had right Lumpectomy with SLNB on 08/17/2021 with 0/3 LN's. She will have radiation and anti-estrogens    Patient Stated Goals decrease pain, improve ROM    Currently in Pain? Yes    Pain Score 6  Pain Orientation Left    Pain Descriptors / Indicators Aching;Tender    Pain Type Acute pain    Pain Onset In the past 7 days    Multiple Pain Sites No                               [facility] Adult PT Treatment/Exercise - 12/01/21 0001       Manual Therapy   Myofascial Release MFR to cording right forearm. No real stretch felt by pt.    Manual Lymphatic Drainage (MLD) In Supine, head of table elevated: Short neck, 5 diaphragmatic breaths, Lt axillary and Rt inguinal nodes, anterior inter-axillayr and Rt axillo-inguinal anastomosis, then focused on Rt breast, then finished retracing all steps in supine ending with LN. Pt did not practice steps secondary to new onset of  left thumb pain. Will see MD today.    Passive ROM PROM right shoulder flexion, scaption and abd in supine                          PT Long Term Goals - 11/01/21 1015       PT LONG TERM GOAL #1   Title Pt will regain right shoulder AROM within 5-10 degrees of baseline for ability to peform home and work activities    Time 6    Period Weeks    Status Achieved    Target Date 11/01/21      PT LONG TERM GOAL #2   Title Pain and tightness from cording decreased atleast 50%    Baseline 60% better    Time 6    Period Weeks    Status Achieved    Target Date 11/01/21      PT LONG TERM GOAL #3   Title Pt will attend [facility] and will have a good understanding of skin care and precautions for lymphedema    Baseline not yet    Time 6    Period Weeks    Status On-going    Target Date 12/13/21      PT LONG TERM GOAL #4   Title Pt will  purchase compression bra and will have 50% reduction in edema    Baseline has bra;continues with swelling icreased since radiation    Time 6    Period Weeks    Status On-going    Target Date 12/13/21      PT LONG TERM GOAL #5   Title Pt will be educated and compliant with MLD as need for right breast swelling    Time 6    Period Weeks    Status On-going    Target Date 12/13/21                   Plan - 12/01/21 1051     Clinical Impression Statement Pt is still hurting in her thumb and has not been able to keep up with her MLD and exercises. continued MFR to right elbow cording but no pops or releases noted. Mild end range tightness in shoulder today. Continued MLD to right breast.  Fibrosis continues to be improved. Pt encouraged to see another MD about her thumb which continues to bother her.    Personal Factors and Comorbidities Comorbidity 2    Comorbidities Breast Cancer with 3 LN removed, chemo, pending radiation, DM    Examination-Activity Limitations Reach Overhead;Dressing;Lift    Examination-Participation  Restrictions Occupation  Stability/Clinical Decision Making Stable/Uncomplicated    Rehab Potential Excellent    PT Frequency 2x / week    PT Duration 6 weeks    PT Treatment/Interventions Therapeutic exercise;Patient/family education;Orthotic Fit/Training;Manual techniques;Passive range of motion;Scar mobilization;Manual lymph drainage;Vasopneumatic Device    PT Next Visit Plan How is Left Thumb,Continue MLD and pt instructions    Consulted and Agree with Plan of Care Patient             Patient will benefit from skilled therapeutic intervention in order to improve the following deficits and impairments:  Decreased range of motion, Increased fascial restricitons, Impaired UE functional use, Decreased activity tolerance, Decreased knowledge of precautions, Pain, Decreased scar mobility, Impaired flexibility, Decreased strength, Increased edema, Postural dysfunction  Visit Diagnosis: Malignant neoplasm of upper-outer quadrant of right breast in female, estrogen receptor positive (HCC)  Abnormal posture  Stiffness of right shoulder, not elsewhere classified  Localized edema     Problem List Patient Active Problem List   Diagnosis Date Noted   Hyperglycemia due to type 2 diabetes mellitus ([place]) 07/08/2021   Pure hypercholesterolemia 07/08/2021   Morbid obesity ([place]) 07/08/2021   Chemotherapy-induced peripheral neuropathy ([place]) 07/08/2021   Genetic testing 03/17/2021   Port-A-Cath in place 03/03/2021   Family history of breast cancer 02/24/2021   Family history of pancreatic cancer 02/24/2021   Family history of colon cancer 02/24/2021   Family history of lung cancer 02/24/2021   Malignant neoplasm of upper-outer quadrant of right breast in female, estrogen receptor positive ([place]) 02/15/2021   Obesity    Constipation 08/30/2011    Claris Pong, PT 12/01/2021, 10:59 AM  [facility] @ [address] [place],  [place], [address] Phone: [phone]   Fax:  [phone]  Name: Sandra Bishop MRN: [id number] Date of Birth: 12/07/1970

## 2021-12-06 ENCOUNTER — Ambulatory Visit: Payer: BC Managed Care – PPO

## 2021-12-06 ENCOUNTER — Ambulatory Visit: Payer: BC Managed Care – PPO | Attending: Radiation Oncology

## 2021-12-06 DIAGNOSIS — Z170 Estrogen receptor positive status [ER+]: Secondary | ICD-10-CM | POA: Insufficient documentation

## 2021-12-06 DIAGNOSIS — C50411 Malignant neoplasm of upper-outer quadrant of right female breast: Secondary | ICD-10-CM | POA: Diagnosis not present

## 2021-12-06 DIAGNOSIS — R600 Localized edema: Secondary | ICD-10-CM

## 2021-12-06 DIAGNOSIS — M25611 Stiffness of right shoulder, not elsewhere classified: Secondary | ICD-10-CM

## 2021-12-06 DIAGNOSIS — R293 Abnormal posture: Secondary | ICD-10-CM

## 2021-12-06 NOTE — Therapy (Signed)
[facility] @ [place] [place], [place], [address] Phone: [phone]   Fax:  [phone]  Physical Therapy Treatment  Patient Details  Name: Sandra Bishop MRN: [id number] Date of Birth: 25-Jan-1971 Referring Provider (PT): Dr. Lindi Adie   Encounter Date: 12/06/2021   PT End of Session - 12/06/21 0910     Visit Number 13    Number of Visits 21    Date for PT Re-Evaluation 12/13/21    PT Start Time 0906    PT Stop Time 1000    PT Time Calculation (min) 54 min    Activity Tolerance Patient tolerated treatment well    Behavior During Therapy [facility] for tasks assessed/performed             Past Medical History:  Diagnosis Date   Allergy    Anemia    Breast cancer ([place])    Right   Constipation    Diabetes mellitus without complication ([place])    Family history of adverse reaction to anesthesia    Aunt is hard to wake up from general anesthesia   Family history of breast cancer    Family history of colon cancer    Family history of lung cancer    Family history of pancreatic cancer    History of migraine    during pregancy   Obesity    Palpitations     Past Surgical History:  Procedure Laterality Date   BREAST BIOPSY Right 02/11/2021   x2   BREAST BIOPSY Right 03/22/2021   x3   BREAST LUMPECTOMY WITH RADIOACTIVE SEED AND SENTINEL LYMPH NODE BIOPSY Right 08/17/2021   Procedure: RIGHT BREAST LUMPECTOMY WITH RADIOACTIVE SEED AND SENTINEL LYMPH NODE BIOPSY;  Surgeon: [name], MD;  Location: [facility];  Service: General;  Laterality: Right;   EYE SURGERY Right    PORT-A-CATH REMOVAL N/A 08/17/2021   Procedure: REMOVAL PORT-A-CATH;  Surgeon: [name], MD;  Location: [facility];  Service: General;  Laterality: N/A;   PORTACATH PLACEMENT N/A 03/02/2021   Procedure: INSERTION PORT-A-CATH;  Surgeon: [name], MD;  [facility];  Service: General;  Laterality: N/A;   RADIOACTIVE SEED GUIDED AXILLARY SENTINEL  LYMPH NODE Right 08/17/2021   Procedure: RADIOACTIVE SEED GUIDED RIGHT SENTINEL LYMPH NODE EXCISION;  Surgeon: [name], MD;  Location: [facility];  Service: General;  Laterality: Right;   RADIOACTIVE SEED GUIDED EXCISIONAL BREAST BIOPSY Left 08/17/2021   Procedure: RADIOACTIVE SEED GUIDED EXCISIONAL LEFT BREAST BIOPSY;  Surgeon: [name], MD;  Location: [facility];  Service: General;  Laterality: Left;   TUBAL LIGATION      There were no vitals filed for this visit.   Subjective Assessment - 12/06/21 0908     Subjective I didn't get to go to the doctor for my thumb. I tried icing it, and aleve gel but it didnt help.    Pertinent History Pts.Screening mammogram determined  mass in the right breast  Diagnostic mammogram and [place]: a persistent irregular mass 1.2 cm within the upper-outer right breast. 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 Pt has finished neoadjuvant chemo and had right Lumpectomy with SLNB on 08/17/2021 with 0/3 LN's. She will have radiation and anti-estrogens    Patient Stated Goals decrease pain, improve ROM    Currently in Pain? Yes    Pain Score 5     Pain Location --   thumb on left  Pain Descriptors / Indicators Aching;Tender    Pain Type Acute pain    Pain Onset In the past 7 days                               [facility] Adult PT Treatment/Exercise - 12/06/21 0001       Manual Therapy   Myofascial Release MFR to cording in right forearm    Manual Lymphatic Drainage (MLD) In Supine, head of table elevated: Short neck, 5 diaphragmatic breaths, Lt axillary and Rt inguinal nodes, anterior inter-axillayr and Rt axillo-inguinal anastomosis, then focused on Rt breast, then finished retracing all steps in supine ending with LN. Pt did not practice steps secondary to new onset of left thumb pain. Will see MD today.    Passive ROM PROM right shoulder flexion and abd in supine                           PT Long Term Goals - 11/01/21 1015       PT LONG TERM GOAL #1   Title Pt will regain right shoulder AROM within 5-10 degrees of baseline for ability to peform home and work activities    Time 6    Period Weeks    Status Achieved    Target Date 11/01/21      PT LONG TERM GOAL #2   Title Pain and tightness from cording decreased atleast 50%    Baseline 60% better    Time 6    Period Weeks    Status Achieved    Target Date 11/01/21      PT LONG TERM GOAL #3   Title Pt will attend ABC and will have a good understanding of skin care and precautions for lymphedema    Baseline not yet    Time 6    Period Weeks    Status On-going    Target Date 12/13/21      PT LONG TERM GOAL #4   Title Pt will  purchase compression bra and will have 50% reduction in edema    Baseline has bra;continues with swelling icreased since radiation    Time 6    Period Weeks    Status On-going    Target Date 12/13/21      PT LONG TERM GOAL #5   Title Pt will be educated and compliant with MLD as need for right breast swelling    Time 6    Period Weeks    Status On-going    Target Date 12/13/21                   Plan - 12/06/21 1208     Clinical Impression Statement pts thumb continues to hurt and she will try and see MD today. Cording is still present in right antecubital fossa and felt tighter today to pt.  Continued MLD to right breast with fibrosis noted medial and lateral breast today. Pt notices it the most when she sleeps on her stomach. Will send demographics to [employer]    Personal Factors and Comorbidities Comorbidity 2    Comorbidities Breast Cancer with 3 LN removed, chemo, pending radiation, DM    Examination-Activity Limitations Reach Overhead;Dressing;Lift    Examination-Participation Restrictions Occupation    Stability/Clinical Decision Making Stable/Uncomplicated    Rehab Potential Excellent    PT Frequency 2x / week    PT Duration 6  weeks    PT Treatment/Interventions Therapeutic exercise;Patient/family education;Orthotic Fit/Training;Manual techniques;Passive range of motion;Scar mobilization;Manual lymph drainage;Vasopneumatic Device    PT Next Visit Plan How is Left Thumb,Continue MLD and pt instructions    PT Home Exercise Plan 4 post op exercises, wall chest stretch with wrist oscillations; self MLD to Rt breast    Consulted and Agree with Plan of Care Patient             Patient will benefit from skilled therapeutic intervention in order to improve the following deficits and impairments:  Decreased range of motion, Increased fascial restricitons, Impaired UE functional use, Decreased activity tolerance, Decreased knowledge of precautions, Pain, Decreased scar mobility, Impaired flexibility, Decreased strength, Increased edema, Postural dysfunction  Visit Diagnosis: Malignant neoplasm of upper-outer quadrant of right breast in female, estrogen receptor positive (HCC)  Abnormal posture  Stiffness of right shoulder, not elsewhere classified  Localized edema     Problem List Patient Active Problem List   Diagnosis Date Noted   Hyperglycemia due to type 2 diabetes mellitus ([place]) 07/08/2021   Pure hypercholesterolemia 07/08/2021   Morbid obesity ([place]) 07/08/2021   Chemotherapy-induced peripheral neuropathy ([place]) 07/08/2021   Genetic testing 03/17/2021   Port-A-Cath in place 03/03/2021   Family history of breast cancer 02/24/2021   Family history of pancreatic cancer 02/24/2021   Family history of colon cancer 02/24/2021   Family history of lung cancer 02/24/2021   Malignant neoplasm of upper-outer quadrant of right breast in female, estrogen receptor positive ([place]) 02/15/2021   Obesity    Constipation 08/30/2011    Claris Pong, PT 12/06/2021, 12:10 PM  [facility] @ [place], [place], [address] Phone: [phone]   Fax:   [phone]  Name: Sandra Bishop MRN: [id number] Date of Birth: 01/23/1971

## 2021-12-07 ENCOUNTER — Telehealth: Payer: Self-pay | Admitting: *Deleted

## 2021-12-07 NOTE — Telephone Encounter (Signed)
[employer] form 703 completed today, placed in designated provider in-basket for collaborative pick up for provider review and signature.

## 2021-12-08 ENCOUNTER — Ambulatory Visit: Payer: BC Managed Care – PPO

## 2021-12-08 DIAGNOSIS — C50411 Malignant neoplasm of upper-outer quadrant of right female breast: Secondary | ICD-10-CM

## 2021-12-08 DIAGNOSIS — R293 Abnormal posture: Secondary | ICD-10-CM

## 2021-12-08 DIAGNOSIS — R600 Localized edema: Secondary | ICD-10-CM

## 2021-12-08 DIAGNOSIS — M25611 Stiffness of right shoulder, not elsewhere classified: Secondary | ICD-10-CM

## 2021-12-08 NOTE — Therapy (Signed)
[facility] @ [address] [place], [place], [address] Phone: [phone]   Fax:  [phone]  Physical Therapy Treatment  Patient Details  Name: Sandra Bishop MRN: [id number] Date of Birth: 02/04/1971 Referring Provider (PT): Dr. Lindi Adie   Encounter Date: 12/08/2021   PT End of Session - 12/08/21 0801     Visit Number 14    Number of Visits 21    Date for PT Re-Evaluation 12/13/21    PT Start Time 0801    PT Stop Time 0850    PT Time Calculation (min) 49 min    Activity Tolerance Patient tolerated treatment well    Behavior During Therapy [facility] for tasks assessed/performed             Past Medical History:  Diagnosis Date   Allergy    Anemia    Breast cancer ([place])    Right   Constipation    Diabetes mellitus without complication ([place])    Family history of adverse reaction to anesthesia    Aunt is hard to wake up from general anesthesia   Family history of breast cancer    Family history of colon cancer    Family history of lung cancer    Family history of pancreatic cancer    History of migraine    during pregancy   Obesity    Palpitations     Past Surgical History:  Procedure Laterality Date   BREAST BIOPSY Right 02/11/2021   x2   BREAST BIOPSY Right 03/22/2021   x3   BREAST LUMPECTOMY WITH RADIOACTIVE SEED AND SENTINEL LYMPH NODE BIOPSY Right 08/17/2021   Procedure: RIGHT BREAST LUMPECTOMY WITH RADIOACTIVE SEED AND SENTINEL LYMPH NODE BIOPSY;  Surgeon: [name], MD;  Location: [facility];  Service: General;  Laterality: Right;   EYE SURGERY Right    PORT-A-CATH REMOVAL N/A 08/17/2021   Procedure: REMOVAL PORT-A-CATH;  Surgeon: [name], MD;  Location: [facility];  Service: General;  Laterality: N/A;   PORTACATH PLACEMENT N/A 03/02/2021   Procedure: INSERTION PORT-A-CATH;  Surgeon: [name], MD;  [facility];  Service: General;  Laterality: N/A;   RADIOACTIVE SEED GUIDED AXILLARY SENTINEL  LYMPH NODE Right 08/17/2021   Procedure: RADIOACTIVE SEED GUIDED RIGHT SENTINEL LYMPH NODE EXCISION;  Surgeon: [name], MD;  Location: [facility];  Service: General;  Laterality: Right;   RADIOACTIVE SEED GUIDED EXCISIONAL BREAST BIOPSY Left 08/17/2021   Procedure: RADIOACTIVE SEED GUIDED EXCISIONAL LEFT BREAST BIOPSY;  Surgeon: [name], MD;  Location: [facility];  Service: General;  Laterality: Left;   TUBAL LIGATION      There were no vitals filed for this visit.   Subjective Assessment - 12/08/21 0801     Subjective I saw Dr. Alfonso Ramus. He gave me a 12 day prednisone pack and my thumb is much better, but my sugar is up. Everything is coming around.    Pertinent History Pts.Screening mammogram determined  mass in the right breast  Diagnostic mammogram and [place]: a persistent irregular mass 1.2 cm within the upper-outer right breast. 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 Pt has finished neoadjuvant chemo and had right Lumpectomy with SLNB on 08/17/2021 with 0/3 LN's. She will have radiation and anti-estrogens    Patient Stated Goals decrease pain, improve ROM    Currently in Pain? No/denies    Pain Score 0-No pain    Multiple Pain Sites  No                               [facility] Adult PT Treatment/Exercise - 12/08/21 0001       Manual Therapy   Myofascial Release MFR to cording in right forearm    Manual Lymphatic Drainage (MLD) In Supine, head of table elevated: Short neck, 5 diaphragmatic breaths, Lt axillary and Rt inguinal nodes, anterior inter-axillayr and Rt axillo-inguinal anastomosis, then focused on Rt breast, then finished retracing all steps in supine ending with LN. Pt   practiced steps on right breast and did very well today    Passive ROM PROM right shoulder flexion and abd in supine                          PT Long Term Goals - 11/01/21 1015       PT LONG TERM GOAL #1    Title Pt will regain right shoulder AROM within 5-10 degrees of baseline for ability to peform home and work activities    Time 6    Period Weeks    Status Achieved    Target Date 11/01/21      PT LONG TERM GOAL #2   Title Pain and tightness from cording decreased atleast 50%    Baseline 60% better    Time 6    Period Weeks    Status Achieved    Target Date 11/01/21      PT LONG TERM GOAL #3   Title Pt will attend ABC and will have a good understanding of skin care and precautions for lymphedema    Baseline not yet    Time 6    Period Weeks    Status On-going    Target Date 12/13/21      PT LONG TERM GOAL #4   Title Pt will  purchase compression bra and will have 50% reduction in edema    Baseline has bra;continues with swelling icreased since radiation    Time 6    Period Weeks    Status On-going    Target Date 12/13/21      PT LONG TERM GOAL #5   Title Pt will be educated and compliant with MLD as need for right breast swelling    Time 6    Period Weeks    Status On-going    Target Date 12/13/21                  DC? Plan - 12/08/21 0851     Clinical Impression Statement Pts. thumb is much improved. Cording is still present from elbow to forearm but it is resistant to MFR. She was able to return demonstrate proper form with right breast MLD, and breast was softer afterwards.  Slight tightness in PROM for abduction but no pain. REassess pt for DC next visit.    Personal Factors and Comorbidities Comorbidity 2    Comorbidities Breast Cancer with 3 LN removed, chemo, pending radiation, DM    Examination-Participation Restrictions Occupation    Stability/Clinical Decision Making Stable/Uncomplicated    Rehab Potential Excellent    PT Frequency 2x / week    PT Duration 6 weeks    PT Treatment/Interventions Therapeutic exercise;Patient/family education;Orthotic Fit/Training;Manual techniques;Passive range of motion;Scar mobilization;Manual lymph  drainage;Vasopneumatic Device    PT Next Visit Plan DC?How is Left Thumb,Continue MLD and pt instructions    PT  Home Exercise Plan 4 post op exercises, wall chest stretch with wrist oscillations; self MLD to Rt breast    Consulted and Agree with Plan of Care Patient             Patient will benefit from skilled therapeutic intervention in order to improve the following deficits and impairments:  Decreased range of motion, Increased fascial restricitons, Impaired UE functional use, Decreased activity tolerance, Decreased knowledge of precautions, Pain, Decreased scar mobility, Impaired flexibility, Decreased strength, Increased edema, Postural dysfunction  Visit Diagnosis: Malignant neoplasm of upper-outer quadrant of right breast in female, estrogen receptor positive (HCC)  Abnormal posture  Stiffness of right shoulder, not elsewhere classified  Localized edema     Problem List Patient Active Problem List   Diagnosis Date Noted   Hyperglycemia due to type 2 diabetes mellitus ([place]) 07/08/2021   Pure hypercholesterolemia 07/08/2021   Morbid obesity ([place]) 07/08/2021   Chemotherapy-induced peripheral neuropathy ([place]) 07/08/2021   Genetic testing 03/17/2021   Port-A-Cath in place 03/03/2021   Family history of breast cancer 02/24/2021   Family history of pancreatic cancer 02/24/2021   Family history of colon cancer 02/24/2021   Family history of lung cancer 02/24/2021   Malignant neoplasm of upper-outer quadrant of right breast in female, estrogen receptor positive ([place]) 02/15/2021   Obesity    Constipation 08/30/2011    Claris Pong, PT 12/08/2021, 9:02 AM  [facility] @ [address] [place], [place], [address] Phone: [phone]   Fax:  [phone]  Name: NAIROBI GUSTAFSON MRN: [id number] Date of Birth: [birth year]

## 2021-12-09 NOTE — Telephone Encounter (Signed)
Received completed retirement form today.  Successfully returned to [facility].  Original mailed to Luz Brazen address on file. [address] [place] [address] No further form actions performed, required or requested.

## 2021-12-09 NOTE — Telephone Encounter (Signed)
Connected with ANGELEAH LABRAKE [phone] (home) regarding report other forms nurse received that a new 703 form is needed with date of provider signature 12/15/2021 and, [facility] is completing [employer] disability forms incorrectly.  Connected with [facility] HR Benefits coordinator Vista Mink [phone]) for an appropriate course of action with office holiday closure and provider out of office.    "The State of [place] requires provider statement signed the first through the tenth of the month to certify disability for back-payment of the previous month.  A certification signed 11/15/2021 was received, do not need another certification signed in May as certifications do not carry over to the next month.  Acceptable only if signed the first through the tenth of the month."  New copy of 703 to collaborative advising of above.

## 2021-12-13 ENCOUNTER — Ambulatory Visit: Payer: BC Managed Care – PPO

## 2021-12-13 DIAGNOSIS — M25611 Stiffness of right shoulder, not elsewhere classified: Secondary | ICD-10-CM

## 2021-12-13 DIAGNOSIS — C50411 Malignant neoplasm of upper-outer quadrant of right female breast: Secondary | ICD-10-CM | POA: Diagnosis not present

## 2021-12-13 DIAGNOSIS — R600 Localized edema: Secondary | ICD-10-CM

## 2021-12-13 DIAGNOSIS — R293 Abnormal posture: Secondary | ICD-10-CM

## 2021-12-13 NOTE — Therapy (Signed)
[facility] @ [place], [place], [address] Phone: [phone]   Fax:  [phone]  Physical Therapy Treatment  Patient Details  Name: Sandra Bishop MRN: [id number] Date of Birth: 1971-02-23 Referring Provider (PT): Dr. Lindi Adie   Encounter Date: 12/13/2021   PT End of Session - 12/13/21 0902     Visit Number 15    Number of Visits 21    Date for PT Re-Evaluation 12/13/21    PT Start Time 0902    PT Stop Time 0950    PT Time Calculation (min) 48 min    Activity Tolerance Patient tolerated treatment well    Behavior During Therapy [facility] for tasks assessed/performed             Past Medical History:  Diagnosis Date   Allergy    Anemia    Breast cancer ([place])    Right   Constipation    Diabetes mellitus without complication ([place])    Family history of adverse reaction to anesthesia    Aunt is hard to wake up from general anesthesia   Family history of breast cancer    Family history of colon cancer    Family history of lung cancer    Family history of pancreatic cancer    History of migraine    during pregancy   Obesity    Palpitations     Past Surgical History:  Procedure Laterality Date   BREAST BIOPSY Right 02/11/2021   x2   BREAST BIOPSY Right 03/22/2021   x3   BREAST LUMPECTOMY WITH RADIOACTIVE SEED AND SENTINEL LYMPH NODE BIOPSY Right 08/17/2021   Procedure: RIGHT BREAST LUMPECTOMY WITH RADIOACTIVE SEED AND SENTINEL LYMPH NODE BIOPSY;  Surgeon: [name], MD;  Location: [facility];  Service: General;  Laterality: Right;   EYE SURGERY Right    PORT-A-CATH REMOVAL N/A 08/17/2021   Procedure: REMOVAL PORT-A-CATH;  Surgeon: [name], MD;  Location: [facility];  Service: General;  Laterality: N/A;   PORTACATH PLACEMENT N/A 03/02/2021   Procedure: INSERTION PORT-A-CATH;  Surgeon: [name], MD;  Location: [facility];  Service: General;  Laterality: N/A;   RADIOACTIVE SEED GUIDED AXILLARY SENTINEL  LYMPH NODE Right 08/17/2021   Procedure: RADIOACTIVE SEED GUIDED RIGHT SENTINEL LYMPH NODE EXCISION;  Surgeon: [name], MD;  Location: [facility];  Service: General;  Laterality: Right;   RADIOACTIVE SEED GUIDED EXCISIONAL BREAST BIOPSY Left 08/17/2021   Procedure: RADIOACTIVE SEED GUIDED EXCISIONAL LEFT BREAST BIOPSY;  Surgeon: [name], MD;  Location: [facility];  Service: General;  Laterality: Left;   TUBAL LIGATION      There were no vitals filed for this visit.   Subjective Assessment - 12/13/21 0901     Subjective I had to stop the prednisone because my blood sugar is out of whack. I took it 3 days but and my thumb felt better, but the pain is coming back. My right elbow is sore today and I wonder if it is side effects from my chemo pill.    Pertinent History Pts.Screening mammogram determined  mass in the right breast  Diagnostic mammogram and [place]: a persistent irregular mass 1.2 cm within the upper-outer right breast. 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 Pt has finished neoadjuvant chemo and had right Lumpectomy with SLNB on 08/17/2021 with 0/3 LN's. She will have radiation and anti-estrogens    Currently in Pain? No/denies  tender to touch   Pain Score --   thumb   Pain Location --   left thumb   Pain Orientation Left    Pain Descriptors / Indicators Tender    Pain Type Acute pain    Pain Onset 1 to 4 weeks ago    Pain Frequency Intermittent    Aggravating Factors  moving thumb    Multiple Pain Sites No                [facility] PT Assessment - 12/13/21 0001       Assessment   Medical Diagnosis Right breast Cancer    Referring Provider (PT) Dr. Lindi Adie    Hand Dominance Right      Precautions   Precaution Comments lymphedema risk      Prior Function   Level of Independence Independent      Cognition   Overall Cognitive Status Within Functional Limits for tasks assessed      Observation/Other  Assessments   Observations significant skin darkening right axillary region, right lateral neck and under right breast. Some darkening noted under breast.  0- min welling noted lateral breast near incision and prox to nipple.  Skin much less sensitive,      Posture/Postural Control   Posture Comments FWD head, round shoulder      AROM   Right Shoulder Extension 62 Degrees    Right Shoulder Flexion 160 Degrees    Right Shoulder ABduction 172 Degrees    Right Shoulder Internal Rotation 62 Degrees    Right Shoulder External Rotation 105 Degrees               LYMPHEDEMA/ONCOLOGY QUESTIONNAIRE - 12/13/21 0001       Type   Cancer Type Right breast CA.      Right Upper Extremity Lymphedema   10 cm Proximal to Olecranon Process 38 cm    Olecranon Process 27 cm    10 cm Proximal to Ulnar Styloid Process 23.2 cm    Just Proximal to Ulnar Styloid Process 17 cm    At Base of 2nd Digit 6.4 cm                        [facility] Adult PT Treatment/Exercise - 12/13/21 0001       Manual Therapy   Manual Lymphatic Drainage (MLD) In Supine, head of table elevated: Short neck, 5 diaphragmatic breaths, Lt axillary and Rt inguinal nodes, anterior inter-axillayr and Rt axillo-inguinal anastomosis, then focused on Rt breast, then finished retracing all steps in supine ending with LN. Pt   practiced all steps and did  well today . PT also demonstrated and gave VC's and TC's prn                         PT Long Term Goals - 12/13/21 0911       PT LONG TERM GOAL #1   Title Pt will regain right shoulder AROM within 5-10 degrees of baseline for ability to peform home and work activities    Time 6    Period Weeks    Status Achieved    Target Date 11/01/21      PT LONG TERM GOAL #2   Title Pain and tightness from cording decreased atleast 50%    Baseline no pain or tightness 12/13/2021    Time 6    Period Weeks    Status Achieved    Target  Date 11/01/21      PT LONG  TERM GOAL #3   Title Pt will attend ABC and will have a good understanding of skin care and precautions for lymphedema    Baseline not yet; missed it; will reschedule today    Time 6    Period Weeks    Status Not met      PT LONG TERM GOAL #4   Title Pt will  purchase compression bra and will have 50% reduction in edema    Baseline has bra and notices reduced swelling    Time 6    Period Weeks    Status Achieved    Target Date 12/13/21      PT LONG TERM GOAL #5   Title Pt will be educated and compliant with MLD as need for right breast swelling    Time 6    Period Weeks    Status Achieved    Target Date 12/13/21                   Plan - 12/13/21 0951     Clinical Impression Statement Pt has achieved all goals established at initial evaluation. Pt still has mild cording at her right elbow which does not interfere with ROM. She no longer has pain or tightness with cording. Shoulder ROM is WNL, and she was able to return demonstrate right breast MLD today with occasional VC's and TC's. Only goal not met was ABC class, but she was rescheduled for it today.  Breast edema is minimal now. She was advised to contact me with questions or concerns.  She has not yet heard from [employer].    Personal Factors and Comorbidities Comorbidity 2    Comorbidities Breast Cancer with 3 LN removed, chemo, pending radiation, DM    Examination-Activity Limitations Reach Overhead;Dressing;Lift    Examination-Participation Restrictions Occupation    Rehab Potential Excellent    PT Frequency 2x / week    PT Duration 6 weeks    PT Treatment/Interventions Therapeutic exercise;Patient/family education;Orthotic Fit/Training;Manual techniques;Passive range of motion;Scar mobilization;Manual lymph drainage;Vasopneumatic Device    PT Next Visit Plan Pt is discharged to independent self management    PT Home Exercise Plan 4 post op exercises, wall chest stretch with wrist oscillations; self MLD to Rt  breast    Consulted and Agree with Plan of Care Patient             Patient will benefit from skilled therapeutic intervention in order to improve the following deficits and impairments:  Decreased range of motion, Increased fascial restricitons, Impaired UE functional use, Decreased activity tolerance, Decreased knowledge of precautions, Pain, Decreased scar mobility, Impaired flexibility, Decreased strength, Increased edema, Postural dysfunction  Visit Diagnosis: Malignant neoplasm of upper-outer quadrant of right breast in female, estrogen receptor positive ([facility])  Abnormal posture  Stiffness of right shoulder, not elsewhere classified  Localized edema     Problem List Patient Active Problem List   Diagnosis Date Noted   Hyperglycemia due to type 2 diabetes mellitus ([place]) 07/08/2021   Pure hypercholesterolemia 07/08/2021   Morbid obesity ([place]) 07/08/2021   Chemotherapy-induced peripheral neuropathy ([place]) 07/08/2021   Genetic testing 03/17/2021   Port-A-Cath in place 03/03/2021   Family history of breast cancer 02/24/2021   Family history of pancreatic cancer 02/24/2021   Family history of colon cancer 02/24/2021   Family history of lung cancer 02/24/2021   Malignant neoplasm of upper-outer quadrant of right breast in female, estrogen receptor positive (  [place]) 02/15/2021   Obesity    Constipation 08/30/2011   PHYSICAL THERAPY DISCHARGE SUMMARY  Visits from Start of Care: 15  Current functional level related to goals / functional outcomes: Achieved all goals except attendance at [facility] class:rescheduled today   Remaining deficits: Attendeance at [facility] class, elbow cord not painful or interfering with motion   Education / Equipment: Compression bra   Patient agrees to discharge. Patient goals were partially met. Patient is being discharged due to being pleased with the current functional level.  Claris Pong, PT 12/13/2021, 9:54 AM  [facility] @ [place], [place], [address] Phone: [phone]   Fax:  [phone]  Name: Sandra Bishop MRN: [id number] Date of Birth: 06/04/1971

## 2021-12-15 ENCOUNTER — Ambulatory Visit: Payer: BC Managed Care – PPO

## 2021-12-20 NOTE — Telephone Encounter (Signed)
[employer] form 703 resubmitted per state guidelines to enable Luz Brazen to receive disability for the entire month of May 2023.  [place] [employer] forms signed by patient and provider may not be dated before the first of the month or after the tenth of the month to qualify for disability for the entire previous month.

## 2022-01-12 ENCOUNTER — Telehealth: Payer: Self-pay | Admitting: *Deleted

## 2022-01-12 NOTE — Telephone Encounter (Signed)
[employer] form 703 Disability/FMLA paperwork completed by this nurse today, currently placed in designated mail bin for collaborative pick up.  Awaiting provider review and signature.

## 2022-01-16 NOTE — Telephone Encounter (Signed)
[employer] [employer] Disability paperwork returned today from collaborative signed by provider.    At this time, successfully faxed to [place] fax [phone].  Original copy to alphabetical file folder in appointment registration area near representative number one for patient pick up.   Process completed with copy to bin designated for items to be scanned.  No further instructions received, actions required or performed by this nurse.

## 2022-01-30 ENCOUNTER — Other Ambulatory Visit: Payer: Self-pay | Admitting: Family Medicine

## 2022-01-30 ENCOUNTER — Other Ambulatory Visit (HOSPITAL_COMMUNITY)
Admission: RE | Admit: 2022-01-30 | Discharge: 2022-01-30 | Disposition: A | Discharge status: Home / Self Care | Payer: BC Managed Care – PPO | Source: Ambulatory Visit | Attending: Family Medicine | Admitting: Family Medicine

## 2022-01-30 DIAGNOSIS — Z01411 Encounter for gynecological examination (general) (routine) with abnormal findings: Secondary | ICD-10-CM | POA: Insufficient documentation

## 2022-01-31 ENCOUNTER — Encounter: Payer: Self-pay | Admitting: Adult Health

## 2022-01-31 ENCOUNTER — Inpatient Hospital Stay: Payer: BC Managed Care – PPO | Attending: Adult Health | Admitting: Adult Health

## 2022-01-31 ENCOUNTER — Telehealth: Payer: Self-pay

## 2022-01-31 ENCOUNTER — Other Ambulatory Visit: Payer: Self-pay

## 2022-01-31 VITALS — BP 133/78 | HR 92 | Temp 97.8°F | Resp 18 | Ht 64.0 in | Wt 220.4 lb

## 2022-01-31 DIAGNOSIS — C50411 Malignant neoplasm of upper-outer quadrant of right female breast: Secondary | ICD-10-CM | POA: Insufficient documentation

## 2022-01-31 DIAGNOSIS — Z170 Estrogen receptor positive status [ER+]: Secondary | ICD-10-CM | POA: Insufficient documentation

## 2022-01-31 DIAGNOSIS — Z7982 Long term (current) use of aspirin: Secondary | ICD-10-CM | POA: Insufficient documentation

## 2022-01-31 DIAGNOSIS — Z79810 Long term (current) use of selective estrogen receptor modulators (SERMs): Secondary | ICD-10-CM | POA: Insufficient documentation

## 2022-01-31 DIAGNOSIS — C773 Secondary and unspecified malignant neoplasm of axilla and upper limb lymph nodes: Secondary | ICD-10-CM | POA: Insufficient documentation

## 2022-01-31 DIAGNOSIS — Z79899 Other long term (current) drug therapy: Secondary | ICD-10-CM | POA: Insufficient documentation

## 2022-01-31 NOTE — Progress Notes (Signed)
SURVIVORSHIP VISIT:   BRIEF ONCOLOGIC HISTORY:  Oncology History  Malignant neoplasm of upper-outer quadrant of right breast in female, estrogen receptor positive ([place])  02/15/2021 Initial Diagnosis   Screening mammogram: a possible mass in the right breast  Diagnostic mammogram and [place]: a persistent irregular mass 1.2 cm within the upper-outer right breast. 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    02/23/2021 Cancer Staging   Staging form: Breast, AJCC 8th Edition - Clinical stage from 02/23/2021: Stage IIB (cT1c, cN1(f), cM0, G3, ER+, PR-, HER2-) - Signed by [name], MD on 02/23/2021 Stage prefix: Initial diagnosis Method of lymph node assessment: Core biopsy Histologic grading system: 3 grade system   03/04/2021 - 07/01/2021 Neo-Adjuvant Chemotherapy   Received four cycles of neoadjuvant Adriamycin and Cytoxan from 03/04/2021-04/14/2021.  Followed by 9 cycles of Taxol from 04/28/2021-07/01/2021.  Taxol was dose reduced beginning with cycle 6 due to restless legs and stopped after 9 cycles due to peripheral neuropathy.    03/10/2021 Genetic Testing   Negative genetic testing. No pathogenic variants identified on the [facility] CancerNext-Expanded+RNA panel. The report date is 03/10/2021.  The CancerNext-Expanded + RNAinsight gene panel offered by [employer] and includes sequencing and rearrangement analysis for the following 77 genes: IP, ALK, APC*, ATM*, AXIN2, BAP1, BARD1, BLM, BMPR1A, BRCA1*, BRCA2*, BRIP1*, CDC73, CDH1*,CDK4, CDKN1B, CDKN2A, CHEK2*, CTNNA1, DICER1, FANCC, FH, FLCN, GALNT12, KIF1B, LZTR1, MAX, MEN1, MET, MLH1*, MSH2*, MSH3, MSH6*, MUTYH*, NBN, NF1*, NF2, NTHL1, PALB2*, PHOX2B, PMS2*, POT1, PRKAR1A, PTCH1, PTEN*, RAD51C*, RAD51D*,RB1, RECQL, RET, SDHA, SDHAF2, SDHB, SDHC, SDHD, SMAD4, SMARCA4, SMARCB1, SMARCE1, STK11, SUFU, TMEM127, TP53*,TSC1, TSC2, VHL and XRCC2 (sequencing and deletion/duplication);  EGFR, EGLN1, HOXB13, KIT, MITF, PDGFRA, POLD1 and POLE (sequencing only); EPCAM and GREM1 (deletion/duplication only).   09/20/2021 - 11/08/2021 Radiation Therapy   Site Technique Total Dose (Gy) Dose per Fx (Gy) Completed Fx Beam Energies  Breast, Right: Breast_R 3D 50.4/50.4 1.8 28/28 10X  Breast, Right: Breast_R_SCLV 3D 50.4/50.4 1.8 28/28 6X, 10X  Breast, Right: Breast_R_Bst 3D 10/10 2 5/5 6X, 10X     10/31/2021 -  Anti-estrogen oral therapy   Tamoxifen x 2-5 years followed by switching to aromatase inhibitor once menopausal     INTERVAL HISTORY:  Ms. Salas to review her survivorship care plan detailing her treatment course for breast cancer, as well as monitoring long-term side effects of that treatment, education regarding health maintenance, screening, and overall wellness and health promotion.     Overall, Ms. Dolbow reports feeling quite well.  She is taking Tamoxifen daily and tolerates this moderately well.  She is experiencing some hot flashes.   REVIEW OF SYSTEMS:  Review of Systems  Constitutional:  Negative for appetite change, chills, fatigue, fever and unexpected weight change.  HENT:   Negative for hearing loss, lump/mass and trouble swallowing.   Eyes:  Negative for eye problems and icterus.  Respiratory:  Negative for chest tightness, cough and shortness of breath.   Cardiovascular:  Negative for chest pain, leg swelling and palpitations.  Gastrointestinal:  Negative for abdominal distention, abdominal pain, constipation, diarrhea, nausea and vomiting.  Endocrine: Positive for hot flashes.  Genitourinary:  Negative for difficulty urinating.   Musculoskeletal:  Negative for arthralgias.  Skin:  Negative for itching and rash.  Neurological:  Negative for dizziness, extremity weakness, headaches and numbness.  Hematological:  Negative for adenopathy. Does not bruise/bleed easily.  Psychiatric/Behavioral:  Negative for depression. The patient is not nervous/anxious.  Breast: Denies any new nodularity, masses, tenderness, nipple changes, or nipple discharge.      ONCOLOGY TREATMENT TEAM:  1. Surgeon:  Dr. Barry Dienes at [facility] 2. Medical Oncologist: Dr. Lindi Adie  3. Radiation Oncologist: Dr. Lisbeth Renshaw    PAST MEDICAL/SURGICAL HISTORY:  Past Medical History:  Diagnosis Date   Allergy    Anemia    Breast cancer ([place])    Right   Constipation    Diabetes mellitus without complication (HCC)    Family history of adverse reaction to anesthesia    Aunt is hard to wake up from general anesthesia   Family history of breast cancer    Family history of colon cancer    Family history of lung cancer    Family history of pancreatic cancer    History of migraine    during pregancy   Obesity    Palpitations    Past Surgical History:  Procedure Laterality Date   BREAST BIOPSY Right 02/11/2021   x2   BREAST BIOPSY Right 03/22/2021   x3   BREAST LUMPECTOMY WITH RADIOACTIVE SEED AND SENTINEL LYMPH NODE BIOPSY Right 08/17/2021   Procedure: RIGHT BREAST LUMPECTOMY WITH RADIOACTIVE SEED AND SENTINEL LYMPH NODE BIOPSY;  Surgeon: [name], MD;  Location: [place];  Service: General;  Laterality: Right;   EYE SURGERY Right    PORT-A-CATH REMOVAL N/A 08/17/2021   Procedure: REMOVAL PORT-A-CATH;  Surgeon: [name], MD;  Location: [place];  Service: General;  Laterality: N/A;   PORTACATH PLACEMENT N/A 03/02/2021   Procedure: INSERTION PORT-A-CATH;  Surgeon: [name], MD;  Location: [facility];  Service: General;  Laterality: N/A;   RADIOACTIVE SEED GUIDED AXILLARY SENTINEL LYMPH NODE Right 08/17/2021   Procedure: RADIOACTIVE SEED GUIDED RIGHT SENTINEL LYMPH NODE EXCISION;  Surgeon: [name], MD;  Location: [place];  Service: General;  Laterality: Right;   RADIOACTIVE SEED GUIDED EXCISIONAL BREAST BIOPSY Left 08/17/2021   Procedure: RADIOACTIVE SEED GUIDED EXCISIONAL LEFT BREAST BIOPSY;  Surgeon: [name], MD;  Location: [place];  Service: General;   Laterality: Left;   TUBAL LIGATION       ALLERGIES:  No Known Allergies   CURRENT MEDICATIONS:  Outpatient Encounter Medications as of 01/31/2022  Medication Sig   aspirin EC 81 MG tablet Take 81 mg by mouth daily. Swallow whole.   blood glucose meter kit and supplies Dispense based on patient and insurance preference. Use up to four times daily as directed. (FOR ICD-10 E10.9, E11.9).  ( One-Touch Verio meter)   Cholecalciferol (VITAMIN D3) 50 MCG (2000 UT) TABS Take 2,000 Units by mouth daily.   gabapentin (NEURONTIN) 100 MG capsule Take 100 mg by mouth 2 (two) times daily.   glucose blood test strip Use to test your blood sugar   HYDROmorphone (DILAUDID) 4 MG tablet Take 1 tablet (4 mg total) by mouth every 6 (six) hours as needed for severe pain.   Lancets (ONETOUCH ULTRASOFT) lancets Use to check blood glucose three times daily. Use as instructed. E11.9.   magnesium oxide (MAG-OX) 400 (240 Mg) MG tablet Take 1 tablet (400 mg total) by mouth daily.   ONETOUCH VERIO test strip TEST FOUR TIMES DAILY AS DIRECTED   rosuvastatin (CRESTOR) 5 MG tablet Take 5 mg by mouth every Tuesday.   RYBELSUS 7 MG TABS Take 7 mg by mouth every morning.   tamoxifen (NOLVADEX) 20 MG tablet Take 1 tablet (20 mg total) by mouth daily.   No facility-administered encounter medications on file as  of 01/31/2022.     ONCOLOGIC FAMILY HISTORY:  Family History  Problem Relation Age of Onset   Hypertension Mother    Pancreatic cancer Father 47   Breast cancer Half-Sister 15       negative genetic testing   Colon cancer Maternal Uncle        dx >50   Lung cancer Maternal Uncle        dx 51s, hx smoking   Cancer Paternal Aunt        unknown type, dx early 41s   Heart failure Maternal Grandmother    Breast cancer Other 64       mother's first cousin    SOCIAL HISTORY:  Social History   Socioeconomic History   Marital status: Divorced    Spouse name: Not on file   Number of children: 2   Years  of education: Not on file   Highest education level: Not on file  Occupational History   Not on file  Tobacco Use   Smoking status: Former    Types: Cigarettes    Quit date: 12/28/2010    Years since quitting: 11.1   Smokeless tobacco: Never   Tobacco comments:    quit in 2012, previously smoked 15 years  Vaping Use   Vaping Use: Not on file  Substance and Sexual Activity   Alcohol use: Not Currently    Comment: occ   Drug use: No   Sexual activity: Yes    Birth control/protection: Surgical  Other Topics Concern   Not on file  Social History Narrative   Not on file   Social Determinants of Health   Financial Resource Strain: Not on file  Food Insecurity: Not on file  Transportation Needs: Not on file  Physical Activity: Not on file  Stress: Not on file  Social Connections: Not on file  Intimate Partner Violence: Not on file     OBSERVATIONS/OBJECTIVE:  BP 133/78 (BP Location: Left Arm, Patient Position: Sitting)   Pulse 92   Temp 97.8 F (36.6 C) (Tympanic)   Resp 18   Ht $R'5\' 4"'ZR$  (1.626 m)   Wt 220 lb 6.4 oz (100 kg)   SpO2 98%   BMI 37.83 kg/m  GENERAL: Patient is a well appearing female in no acute distress HEENT:  Sclerae anicteric.  Oropharynx clear and moist. No ulcerations or evidence of oropharyngeal candidiasis. Neck is supple.  NODES:  No cervical, supraclavicular, or axillary lymphadenopathy palpated.  BREAST EXAM:  right breast + swelling from radiation, no sign of local recurrence, left breast is benign LUNGS:  Clear to auscultation bilaterally.  No wheezes or rhonchi. HEART:  Regular rate and rhythm. No murmur appreciated. ABDOMEN:  Soft, nontender.  Positive, normoactive bowel sounds. No organomegaly palpated. MSK:  No focal spinal tenderness to palpation. Full range of motion bilaterally in the upper extremities. EXTREMITIES:  No peripheral edema.   SKIN:  Clear with no obvious rashes or skin changes. No nail dyscrasia. NEURO:  Nonfocal. Well  oriented.  Appropriate affect.   LABORATORY DATA:  None for this visit.  DIAGNOSTIC IMAGING:  None for this visit.      ASSESSMENT AND PLAN:  Ms.. Mckinlay is a pleasant 51 y.o. female with Stage IIB right breast invasive ductal carcinoma, ER+/PR+/HER2-, diagnosed in 02/2021, treated with neoadjuvant chemotherapy, lumpectomy, adjuvant radiation therapy, and anti-estrogen therapy with Tamoxifen beginning in 10/2021.  She presents to the Survivorship Clinic for our initial meeting and routine follow-up post-completion of treatment for  breast cancer.    1. Stage IIB right breast cancer:  Sandra Bishop is continuing to recover from definitive treatment for breast cancer. She is a candidate for Verzenio considering the fact that her breast cancer had 1 positive lymph node, was grade 3, and had ki-67 of 80%.  She will follow-up with her medical oncologist, Dr. Lindi Adie in 4 weeks to discuss this further.  We also discussed [place] testing today that she will further review with Dr. Lindi Adie in 4 weeks at her f/u.  I gave her handouts about the Verzenio and the Signatera testing today.    She will continue her anti-estrogen therapy with Tamoxifen. Thus far, she is tolerating the Tamoxifen well, with minimal side effects. She was instructed to make Dr. Lindi Adie or myself aware if she begins to experience any worsening side effects of the medication and I could see her back in clinic to help manage those side effects, as needed. Her mammogram is due 04/2022; orders placed today.   Today, a comprehensive survivorship care plan and treatment summary was reviewed with the patient today detailing her breast cancer diagnosis, treatment course, potential late/long-term effects of treatment, appropriate follow-up care with recommendations for the future, and patient education resources.  A copy of this summary, along with a letter will be sent to the patient's primary care provider via mail/fax/In Basket message after today's  visit.    2. Bone health: She was given education on specific activities to promote bone health.  3. Cancer screening:  Due to Ms. Keener's history and her age, she should receive screening for skin cancers, colon cancer, and gynecologic cancers.  The information and recommendations are listed on the patient's comprehensive care plan/treatment summary and were reviewed in detail with the patient.    4. Health maintenance and wellness promotion: Ms. Cuoco was encouraged to consume 5-7 servings of fruits and vegetables per day. We reviewed the "Nutrition Rainbow" handout.  She was also encouraged to engage in moderate to vigorous exercise for 30 minutes per day most days of the week. We discussed the [employer] fitness program, which is designed for cancer survivors to help them become more physically fit after cancer treatments.  She was instructed to limit her alcohol consumption and continue to abstain from tobacco use.     5. Support services/counseling: It is not uncommon for this period of the patient's cancer care trajectory to be one of many emotions and stressors.  She was given information regarding our available services and encouraged to contact me with any questions or for help enrolling in any of our support group/programs.    Follow up instructions:    -Return to cancer center in 4 weeks for labs and f/u with Dr. Lindi Adie  -Mammogram due in 04/2022 -Follow up with surgery per Dr. Barry Dienes -She is welcome to return back to the Survivorship Clinic at any time; no additional follow-up needed at this time.  -Consider referral back to survivorship as a long-term survivor for continued surveillance  The patient was provided an opportunity to ask questions and all were answered. The patient agreed with the plan and demonstrated an understanding of the instructions.   Total encounter time:45 minutes*in face-to-face visit time, chart review, lab review, care coordination, order entry, and  documentation of the encounter time.  Wilber Bihari, NP 01/31/22 10:49 AM Medical Oncology and Hematology [facility] [place], [place] [address] Tel. [phone]    Fax. [phone]  *Total Encounter Time as  defined by the Centers for Medicare and Medicaid Services includes, in addition to the face-to-face time of a patient visit (documented in the note above) non-face-to-face time: obtaining and reviewing outside history, ordering and reviewing medications, tests or procedures, care coordination (communications with other health care professionals or caregivers) and documentation in the medical record.

## 2022-01-31 NOTE — Patient Instructions (Addendum)
Taking Verzenio in addition to your antiestrogen therapy improved invasive disease-free survival by 5% in clinical trial.  Abemaciclib Combined With Endocrine Therapy for the Adjuvant Treatment of HR+, HER2?, Node-Positive, High-Risk, Early Breast Cancer (monarchE)  Journal of Clinical Oncology ([email or web address])   We will see you back in a few weeks for labs and then f/u with Dr. Lindi Adie to discuss Verzenio and [place] testing.

## 2022-01-31 NOTE — Telephone Encounter (Signed)
Sandra Minister, RN at Dr Rosario Jacks office regarding pt's need for colonoscopy. Pt states she was offered cologuard but declines this d/t family hx and prefers to have colonoscopy. Sandra Derry, RN states she will consult with MD regarding this procedure and call pt. Pt is aware.

## 2022-02-02 ENCOUNTER — Other Ambulatory Visit: Payer: Self-pay | Admitting: Family Medicine

## 2022-02-02 DIAGNOSIS — R221 Localized swelling, mass and lump, neck: Secondary | ICD-10-CM

## 2022-02-02 LAB — CYTOLOGY - PAP
Adequacy: ABSENT
Comment: NEGATIVE
Diagnosis: UNDETERMINED — AB
High risk HPV: POSITIVE — AB

## 2022-02-06 ENCOUNTER — Ambulatory Visit
Admission: RE | Admit: 2022-02-06 | Discharge: 2022-02-06 | Disposition: A | Discharge status: Home / Self Care | Payer: BC Managed Care – PPO | Source: Ambulatory Visit | Attending: Family Medicine | Admitting: Family Medicine

## 2022-02-06 DIAGNOSIS — R221 Localized swelling, mass and lump, neck: Secondary | ICD-10-CM

## 2022-02-07 ENCOUNTER — Telehealth: Payer: Self-pay | Admitting: *Deleted

## 2022-02-07 NOTE — Telephone Encounter (Signed)
Connected with SAHARA FUJIMOTO [phone] (home) regarding [employer] form 703.  Last month and again today received page one of two which is for employee to complete and sign only.  This nurse wants to ensure page two has not been misfiled if received.  Need page two of form 703 for provider review and signature for return.  Last months was returned to Doristine Johns yet page two provides HR contact and return information.  Also, a signed [place] required to process or complete forms was not received.    "Was given a packet of information from work.  Do not have another form.  I have never brought in anything but that one page to your receptionist.  Do not know what you are talking about.  Check my records and you will see one form."  Advised this nurse does see page two last delivered to office with 703 forms signed by provider on 11/15/2021.  Connect with HR, inquire of page 2 of 703 form.  Need page 2 and a signed [facility] release (ROI).  "I will call downtown to speak with Vista Mink tomorrow.  I am sick of this and will be glad when this is over."

## 2022-02-10 ENCOUNTER — Telehealth: Payer: Self-pay

## 2022-02-10 NOTE — Telephone Encounter (Signed)
Return call to pt, she is asking what medication we gave her for a yeast infection previously.  Per our records we had given her '150mg'$  po fluconazole while under treatment.  Pt verbalized understanding and thanks

## 2022-02-14 HISTORY — PX: BREAST EXCISIONAL BIOPSY: SUR124

## 2022-02-14 HISTORY — PX: BREAST LUMPECTOMY: SHX2

## 2022-02-17 NOTE — Progress Notes (Signed)
Patient Care Team: [name], MD as PCP - General (Family Medicine) [name], MD as Consulting Physician (General Surgery) [name], MD as Consulting Physician (Hematology and Oncology) [name], MD as Consulting Physician (Radiation Oncology)  DIAGNOSIS:  Encounter Diagnosis  Name Primary?   Malignant neoplasm of upper-outer quadrant of right breast in female, estrogen receptor positive ([place])     SUMMARY OF ONCOLOGIC HISTORY: Oncology History  Malignant neoplasm of upper-outer quadrant of right breast in female, estrogen receptor positive ([place])  02/15/2021 Initial Diagnosis   Screening mammogram: a possible mass in the right breast  Diagnostic mammogram and [place]: a persistent irregular mass 1.2 cm within the upper-outer right breast. 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    02/23/2021 Cancer Staging   Staging form: Breast, AJCC 8th Edition - Clinical stage from 02/23/2021: Stage IIB (cT1c, cN1(f), cM0, G3, ER+, PR-, HER2-) - Signed by [name], MD on 02/23/2021 Stage prefix: Initial diagnosis Method of lymph node assessment: Core biopsy Histologic grading system: 3 grade system   03/04/2021 - 07/01/2021 Neo-Adjuvant Chemotherapy   Received four cycles of neoadjuvant Adriamycin and Cytoxan from 03/04/2021-04/14/2021.  Followed by 9 cycles of Taxol from 04/28/2021-07/01/2021.  Taxol was dose reduced beginning with cycle 6 due to restless legs and stopped after 9 cycles due to peripheral neuropathy.    03/10/2021 Genetic Testing   Negative genetic testing. No pathogenic variants identified on the [facility] CancerNext-Expanded+RNA panel. The report date is 03/10/2021.  The CancerNext-Expanded + RNAinsight gene panel offered by [employer] and includes sequencing and rearrangement analysis for the following 77 genes: IP, ALK, APC*, ATM*, AXIN2, BAP1, BARD1, BLM, BMPR1A, BRCA1*, BRCA2*,  BRIP1*, CDC73, CDH1*,CDK4, CDKN1B, CDKN2A, CHEK2*, CTNNA1, DICER1, FANCC, FH, FLCN, GALNT12, KIF1B, LZTR1, MAX, MEN1, MET, MLH1*, MSH2*, MSH3, MSH6*, MUTYH*, NBN, NF1*, NF2, NTHL1, PALB2*, PHOX2B, PMS2*, POT1, PRKAR1A, PTCH1, PTEN*, RAD51C*, RAD51D*,RB1, RECQL, RET, SDHA, SDHAF2, SDHB, SDHC, SDHD, SMAD4, SMARCA4, SMARCB1, SMARCE1, STK11, SUFU, TMEM127, TP53*,TSC1, TSC2, VHL and XRCC2 (sequencing and deletion/duplication); EGFR, EGLN1, HOXB13, KIT, MITF, PDGFRA, POLD1 and POLE (sequencing only); EPCAM and GREM1 (deletion/duplication only).   09/20/2021 - 11/08/2021 Radiation Therapy   Site Technique Total Dose (Gy) Dose per Fx (Gy) Completed Fx Beam Energies  Breast, Right: Breast_R 3D 50.4/50.4 1.8 28/28 10X  Breast, Right: Breast_R_SCLV 3D 50.4/50.4 1.8 28/28 6X, 10X  Breast, Right: Breast_R_Bst 3D 10/10 2 5/5 6X, 10X     10/31/2021 -  Anti-estrogen oral therapy   Tamoxifen x 2-5 years followed by switching to aromatase inhibitor once menopausal     CHIEF COMPLIANT: Follow-up of right breast cancer currently on tamoxifen    INTERVAL HISTORY: Sandra Bishop is a  51 y.o. with above-mentioned history of invasive ductal carcinoma of the right breast. She presents to the clinic for a follow-up.  She reports moderate to severe hot flashes intermittently.  Otherwise she is tolerating tamoxifen extremely well.  She had some joint stiffness in her thumb which has subsided.  She is here today to discuss the pros and cons of Verzinio.   ALLERGIES:  has No Known Allergies.  MEDICATIONS:  Current Outpatient Medications  Medication Sig Dispense Refill   aspirin EC 81 MG tablet Take 81 mg by mouth daily. Swallow whole.     blood glucose meter kit and supplies Dispense based on patient and insurance preference. Use up to four times daily as directed. (FOR ICD-10 E10.9, E11.9).  (  One-Touch Verio meter) 1 each 0   Cholecalciferol (VITAMIN D3) 50 MCG (2000 UT) TABS Take 2,000 Units by mouth daily.      gabapentin (NEURONTIN) 100 MG capsule Take 100 mg by mouth 2 (two) times daily.     JARDIANCE 25 MG TABS tablet Take 25 mg by mouth daily.     Lancets (ONETOUCH ULTRASOFT) lancets Use to check blood glucose three times daily. Use as instructed. E11.9. 100 each 12   ONETOUCH VERIO test strip TEST FOUR TIMES DAILY AS DIRECTED 100 strip 2   rosuvastatin (CRESTOR) 5 MG tablet Take 5 mg by mouth every Tuesday.     Semaglutide,0.25 or 0.5MG/DOS, (OZEMPIC, 0.25 OR 0.5 MG/DOSE,) 2 MG/3ML SOPN Inject 0.25-0.5 mg into the skin once a week.     tamoxifen (NOLVADEX) 20 MG tablet Take 1 tablet (20 mg total) by mouth daily. 90 tablet 3   No current facility-administered medications for this visit.    PHYSICAL EXAMINATION: ECOG PERFORMANCE STATUS: 1 - Symptomatic but completely ambulatory  Vitals:   02/27/22 1055  BP: (!) 152/90  Pulse: 94  Resp: 17  Temp: (!) 97.3 F (36.3 C)  SpO2: 95%   Filed Weights   02/27/22 1055  Weight: 219 lb 6.4 oz (99.5 kg)      LABORATORY DATA:  I have reviewed the data as listed    Latest Ref Rng & Units 02/27/2022   10:37 AM 08/12/2021    9:56 AM 07/08/2021    7:53 AM  CMP  Glucose 70 - 99 mg/dL 138  154  224   BUN 6 - 20 mg/dL '10  12  12   ' Creatinine 0.44 - 1.00 mg/dL 0.85  0.92  0.78   Sodium 135 - 145 mmol/L 138  135  137   Potassium 3.5 - 5.1 mmol/L 3.9  3.9  4.0   Chloride 98 - 111 mmol/L 109  104  105   CO2 22 - 32 mmol/L '26  25  25   ' Calcium 8.9 - 10.3 mg/dL 9.0  9.3  9.2   Total Protein 6.5 - 8.1 g/dL 7.6   7.6   Total Bilirubin 0.3 - 1.2 mg/dL 0.3   0.3   Alkaline Phos 38 - 126 U/L 60   76   AST 15 - 41 U/L 13   19   ALT 0 - 44 U/L 17   19     Lab Results  Component Value Date   WBC 5.6 02/27/2022   HGB 12.0 02/27/2022   HCT 36.2 02/27/2022   MCV 86.4 02/27/2022   PLT 194 02/27/2022   NEUTROABS 3.6 02/27/2022    ASSESSMENT & PLAN:  Malignant neoplasm of upper-outer quadrant of right breast in female, estrogen receptor positive  ([place]) 02/15/2021:Screening mammogram: a possible mass in the right breast  Diagnostic mammogram and [place]: a persistent irregular mass 1.2 cm within the upper-outer right breast. 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    Recommendation based on multidisciplinary tumor board: 1. Neoadjuvant chemotherapy with Adriamycin and Cytoxan dose dense 4 followed by Taxol weekly 9 (stopped early for AEs)  2. Followed by breast conserving surgery with  targeted axillary dissection: 08/05/2021: Pathologic complete response, 0/3 lymph nodes negative 3. Followed by adjuvant radiation therapy 09/30/2021-11/07/2021 4.  Followed by adjuvant antiestrogen therapy (patient was premenopausal prior to chemo)  ------------------------------------------------------------------------------------------------------------------------------------ Tamoxifen toxicities: Other than hot flashes she is tolerating tamoxifen extremely well.  I encouraged her to take tamoxifen on a consistent basis at the same time  I discussed the pros and cons of Verzinio. Abemaciclib counseling: I discussed at length the risks and benefits of Abemaciclib in combination with letrozole. Adverse effects of Abemaciclib include decreasing neutrophil count, pneumonia, blood clots in lungs as well as nausea and GI symptoms. Side effects of letrozole include hot flashes, muscle aches and pains, uterine bleeding/spotting/cancer, osteoporosis, risk of blood clots.  We cannot combine Verzinio with tamoxifen because of the blood clot risk.  I will check for [facility] and estradiol today.  If she is in menopause then we can switch her from tamoxifen to letrozole and add the Verzinio to the treatment plan.  Telephone visit in 2 weeks to discuss labs      Orders Placed This Encounter  Procedures   Estradiol, Sensitive    Standing Status:   Future    Number of Occurrences:   1    Standing  Expiration Date:   9/67/2897   FSH-Follicle stimulating hormone    Standing Status:   Future    Number of Occurrences:   1    Standing Expiration Date:   02/27/2023   The patient has a good understanding of the overall plan. she agrees with it. she will call with any problems that may develop before the next visit here. Total time spent: 30 mins including face to face time and time spent for planning, charting and co-ordination of care   [name], MD 02/27/22    I Gardiner Coins am scribing for Dr. Lindi Adie  I have reviewed the above documentation for accuracy and completeness, and I agree with the above.

## 2022-02-22 NOTE — Telephone Encounter (Addendum)
[employer] form 703 completed today by this nurse.  Provider reviewed, and signed.  Successfully returned via fax to [place].  Original to [facility] file for patient pick-up as requested.  Process completes with copy to bin designated for [facility] staff to prepare for scanning office.

## 2022-02-24 ENCOUNTER — Other Ambulatory Visit: Payer: Self-pay

## 2022-02-24 DIAGNOSIS — C50411 Malignant neoplasm of upper-outer quadrant of right female breast: Secondary | ICD-10-CM

## 2022-02-27 ENCOUNTER — Other Ambulatory Visit: Payer: Self-pay

## 2022-02-27 ENCOUNTER — Inpatient Hospital Stay: Payer: BC Managed Care – PPO | Attending: Hematology and Oncology

## 2022-02-27 ENCOUNTER — Other Ambulatory Visit: Payer: Self-pay | Admitting: *Deleted

## 2022-02-27 ENCOUNTER — Inpatient Hospital Stay (HOSPITAL_BASED_OUTPATIENT_CLINIC_OR_DEPARTMENT_OTHER): Payer: BC Managed Care – PPO | Admitting: Hematology and Oncology

## 2022-02-27 ENCOUNTER — Inpatient Hospital Stay: Payer: BC Managed Care – PPO

## 2022-02-27 DIAGNOSIS — Z170 Estrogen receptor positive status [ER+]: Secondary | ICD-10-CM | POA: Diagnosis not present

## 2022-02-27 DIAGNOSIS — Z79810 Long term (current) use of selective estrogen receptor modulators (SERMs): Secondary | ICD-10-CM | POA: Diagnosis not present

## 2022-02-27 DIAGNOSIS — C773 Secondary and unspecified malignant neoplasm of axilla and upper limb lymph nodes: Secondary | ICD-10-CM | POA: Insufficient documentation

## 2022-02-27 DIAGNOSIS — C50411 Malignant neoplasm of upper-outer quadrant of right female breast: Secondary | ICD-10-CM | POA: Diagnosis present

## 2022-02-27 LAB — CBC WITH DIFFERENTIAL (CANCER CENTER ONLY)
Abs Immature Granulocytes: 0.01 10*3/uL (ref 0.00–0.07)
Basophils Absolute: 0 10*3/uL (ref 0.0–0.1)
Basophils Relative: 0 %
Eosinophils Absolute: 0.1 10*3/uL (ref 0.0–0.5)
Eosinophils Relative: 1 %
HCT: 36.2 % (ref 36.0–46.0)
Hemoglobin: 12 g/dL (ref 12.0–15.0)
Immature Granulocytes: 0 %
Lymphocytes Relative: 20 %
Lymphs Abs: 1.1 10*3/uL (ref 0.7–4.0)
MCH: 28.6 pg (ref 26.0–34.0)
MCHC: 33.1 g/dL (ref 30.0–36.0)
MCV: 86.4 fL (ref 80.0–100.0)
Monocytes Absolute: 0.7 10*3/uL (ref 0.1–1.0)
Monocytes Relative: 13 %
Neutro Abs: 3.6 10*3/uL (ref 1.7–7.7)
Neutrophils Relative %: 66 %
Platelet Count: 194 10*3/uL (ref 150–400)
RBC: 4.19 MIL/uL (ref 3.87–5.11)
RDW: 13.4 % (ref 11.5–15.5)
WBC Count: 5.6 10*3/uL (ref 4.0–10.5)
nRBC: 0 % (ref 0.0–0.2)

## 2022-02-27 LAB — CMP (CANCER CENTER ONLY)
ALT: 17 U/L (ref 0–44)
AST: 13 U/L — ABNORMAL LOW (ref 15–41)
Albumin: 3.8 g/dL (ref 3.5–5.0)
Alkaline Phosphatase: 60 U/L (ref 38–126)
Anion gap: 3 — ABNORMAL LOW (ref 5–15)
BUN: 10 mg/dL (ref 6–20)
CO2: 26 mmol/L (ref 22–32)
Calcium: 9 mg/dL (ref 8.9–10.3)
Chloride: 109 mmol/L (ref 98–111)
Creatinine: 0.85 mg/dL (ref 0.44–1.00)
GFR, Estimated: >60 mL/min (ref >60)
Glucose, Bld: 138 mg/dL — ABNORMAL HIGH (ref 70–99)
Potassium: 3.9 mmol/L (ref 3.5–5.1)
Sodium: 138 mmol/L (ref 135–145)
Total Bilirubin: 0.3 mg/dL (ref 0.3–1.2)
Total Protein: 7.6 g/dL (ref 6.5–8.1)

## 2022-02-27 NOTE — Assessment & Plan Note (Addendum)
02/15/2021:Screening mammogram: a possible mass in the right breast Diagnostic mammogram and [place]: a persistent irregular mass 1.2 cm within the upper-outer right breast. 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  Recommendationbased on multidisciplinary tumor board: 1. Neoadjuvant chemotherapy with Adriamycin and Cytoxan dose dense 4 followed by Taxol weekly 9 (stopped early for AEs) 2. Followed by breast conserving surgery with targeted axillary dissection: 08/05/2021: Pathologic complete response, 0/3 lymph nodes negative 3. Followed by adjuvant radiation therapy 09/30/2021-11/07/2021 4.Followed by adjuvant antiestrogen therapy(patient was premenopausal prior to chemo) ------------------------------------------------------------------------------------------------------------------------------------ Tamoxifen toxicities: Other than hot flashes she is tolerating tamoxifen extremely well.  I discussed the pros and cons of Verzinio. Abemaciclib counseling: I discussed at length the risks and benefits of Abemaciclib in combination with letrozole. Adverse effects of Abemaciclib include decreasing neutrophil count, pneumonia, blood clots in lungs as well as nausea and GI symptoms. Side effects of letrozole include hot flashes, muscle aches and pains, uterine bleeding/spotting/cancer, osteoporosis, risk of blood clots.  We cannot combine Verzinio with tamoxifen because of the blood clot risk.  I will check for [facility] and estradiol today.  If she is in menopause then we can switch her from tamoxifen to letrozole and add the Verzinio to the treatment plan.  Telephone visit in 2 weeks to discuss labs

## 2022-02-28 ENCOUNTER — Telehealth: Payer: Self-pay | Admitting: Hematology and Oncology

## 2022-02-28 LAB — FOLLICLE STIMULATING HORMONE: FSH: 19.9 m[IU]/mL

## 2022-02-28 NOTE — Telephone Encounter (Signed)
Scheduled appointment per 8/14 los. Patient is aware.

## 2022-03-02 LAB — ESTRADIOL, ULTRA SENS: Estradiol, Sensitive: 9.4 pg/mL

## 2022-03-06 NOTE — Progress Notes (Signed)
HEMATOLOGY-ONCOLOGY TELEPHONE VISIT PROGRESS NOTE  I connected with our patient on 03/14/22 at  2:30 PM EDT by telephone and verified that I am speaking with the correct person using two identifiers.  I discussed the limitations, risks, security and privacy concerns of performing an evaluation and management service by telephone and the availability of in person appointments.  I also discussed with the patient that there may be a patient responsible charge related to this service. The patient expressed understanding and agreed to proceed.   History of Present Illness: Sandra Bishop is a  51 y.o. with above-mentioned history of invasive ductal carcinoma of the right breast. She presents to the clinic for via telephone follow-up to discuss menopausal labs.  Oncology History  Malignant neoplasm of upper-outer quadrant of right breast in female, estrogen receptor positive ([place])  02/15/2021 Initial Diagnosis   Screening mammogram: a possible mass in the right breast  Diagnostic mammogram and [place]: a persistent irregular mass 1.2 cm within the upper-outer right breast. 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    02/23/2021 Cancer Staging   Staging form: Breast, AJCC 8th Edition - Clinical stage from 02/23/2021: Stage IIB (cT1c, cN1(f), cM0, G3, ER+, PR-, HER2-) - Signed by [name], MD on 02/23/2021 Stage prefix: Initial diagnosis Method of lymph node assessment: Core biopsy Histologic grading system: 3 grade system   03/04/2021 - 07/01/2021 Neo-Adjuvant Chemotherapy   Received four cycles of neoadjuvant Adriamycin and Cytoxan from 03/04/2021-04/14/2021.  Followed by 9 cycles of Taxol from 04/28/2021-07/01/2021.  Taxol was dose reduced beginning with cycle 6 due to restless legs and stopped after 9 cycles due to peripheral neuropathy.    03/10/2021 Genetic Testing   Negative genetic testing. No pathogenic variants identified  on the [facility] CancerNext-Expanded+RNA panel. The report date is 03/10/2021.  The CancerNext-Expanded + RNAinsight gene panel offered by [employer] and includes sequencing and rearrangement analysis for the following 77 genes: IP, ALK, APC*, ATM*, AXIN2, BAP1, BARD1, BLM, BMPR1A, BRCA1*, BRCA2*, BRIP1*, CDC73, CDH1*,CDK4, CDKN1B, CDKN2A, CHEK2*, CTNNA1, DICER1, FANCC, FH, FLCN, GALNT12, KIF1B, LZTR1, MAX, MEN1, MET, MLH1*, MSH2*, MSH3, MSH6*, MUTYH*, NBN, NF1*, NF2, NTHL1, PALB2*, PHOX2B, PMS2*, POT1, PRKAR1A, PTCH1, PTEN*, RAD51C*, RAD51D*,RB1, RECQL, RET, SDHA, SDHAF2, SDHB, SDHC, SDHD, SMAD4, SMARCA4, SMARCB1, SMARCE1, STK11, SUFU, TMEM127, TP53*,TSC1, TSC2, VHL and XRCC2 (sequencing and deletion/duplication); EGFR, EGLN1, HOXB13, KIT, MITF, PDGFRA, POLD1 and POLE (sequencing only); EPCAM and GREM1 (deletion/duplication only).   09/20/2021 - 11/08/2021 Radiation Therapy   Site Technique Total Dose (Gy) Dose per Fx (Gy) Completed Fx Beam Energies  Breast, Right: Breast_R 3D 50.4/50.4 1.8 28/28 10X  Breast, Right: Breast_R_SCLV 3D 50.4/50.4 1.8 28/28 6X, 10X  Breast, Right: Breast_R_Bst 3D 10/10 2 5/5 6X, 10X     10/31/2021 -  Anti-estrogen oral therapy   Tamoxifen x 2-5 years followed by switching to aromatase inhibitor once menopausal     REVIEW OF SYSTEMS:   Constitutional: Denies fevers, chills or abnormal weight loss All other systems were reviewed with the patient and are negative. Observations/Objective:     Assessment Plan:  Malignant neoplasm of upper-outer quadrant of right breast in female, estrogen receptor positive ([place]) 02/15/2021:Screening mammogram: a possible mass in the right breast  Diagnostic mammogram and [place]: a persistent irregular mass 1.2 cm within the upper-outer right breast. 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    Recommendation based on  multidisciplinary tumor board: 1. Neoadjuvant  chemotherapy with Adriamycin and Cytoxan dose dense 4 followed by Taxol weekly 9 (stopped early for AEs)  2. Followed by breast conserving surgery with  targeted axillary dissection: 08/05/2021: Pathologic complete response, 0/3 lymph nodes negative 3. Followed by adjuvant radiation therapy 09/30/2021-11/07/2021 4.  Followed by adjuvant antiestrogen therapy (patient was premenopausal prior to chemo)  ------------------------------------------------------------------------------------------------------------------------------------ Tamoxifen toxicities: Other than hot flashes she is tolerating tamoxifen extremely well. I encouraged her to take tamoxifen on a consistent basis at the same time  Abemaciclib cannot be started because she is still premenopausal and requires tamoxifen.  Lab review: FSH 19.9 (still premenopausal), estradiol 9.4  We will recheck this lab again in 6 months.    I discussed the assessment and treatment plan with the patient. The patient was provided an opportunity to ask questions and all were answered. The patient agreed with the plan and demonstrated an understanding of the instructions. The patient was advised to call back or seek an in-person evaluation if the symptoms worsen or if the condition fails to improve as anticipated.   I provided 12 minutes of non-face-to-face time during this encounter.  This includes time for charting and coordination of care   [name], MD   I Gardiner Coins am scribing for Dr. Lindi Adie  I have reviewed the above documentation for accuracy and completeness, and I agree with the above.

## 2022-03-13 ENCOUNTER — Encounter: Payer: Self-pay | Admitting: *Deleted

## 2022-03-13 NOTE — Progress Notes (Signed)
RN successfully faxed medical necessity letter to [employer] for Flexitouch pneumatic compression pump [phone]).

## 2022-03-14 ENCOUNTER — Inpatient Hospital Stay (HOSPITAL_BASED_OUTPATIENT_CLINIC_OR_DEPARTMENT_OTHER): Payer: BC Managed Care – PPO | Admitting: Hematology and Oncology

## 2022-03-14 DIAGNOSIS — Z170 Estrogen receptor positive status [ER+]: Secondary | ICD-10-CM

## 2022-03-14 DIAGNOSIS — C50411 Malignant neoplasm of upper-outer quadrant of right female breast: Secondary | ICD-10-CM

## 2022-03-14 MED ORDER — ASCORBIC ACID 500 MG PO TABS: 500.0000 mg | ORAL_TABLET | Freq: Every day | ORAL | Status: AC

## 2022-03-14 NOTE — Assessment & Plan Note (Signed)
02/15/2021:Screening mammogram: a possible mass in the right breast Diagnostic mammogram and [place]: a persistent irregular mass 1.2 cm within the upper-outer right breast. 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  Recommendationbased on multidisciplinary tumor board: 1. Neoadjuvant chemotherapy with Adriamycin and Cytoxan dose dense 4 followed by Taxol weekly 9 (stopped early for AEs) 2. Followed by breast conserving surgery with targeted axillary dissection: 08/05/2021: Pathologic complete response, 0/3 lymph nodes negative 3. Followed by adjuvant radiation therapy3/17/2023-11/07/2021 4.Followed by adjuvant antiestrogen therapy(patient was premenopausal prior to chemo) ------------------------------------------------------------------------------------------------------------------------------------ Tamoxifen toxicities: Other than hot flashes she is tolerating tamoxifen extremely well. I encouraged her to take tamoxifen on a consistent basis at the same time  Abemaciclib cannot be started because she is still premenopausal and requires tamoxifen.  Lab review: FSH 19.9 (still premenopausal), estradiol 9.4  We will recheck this lab again in 6 months.

## 2022-03-15 ENCOUNTER — Telehealth: Payer: Self-pay | Admitting: *Deleted

## 2022-03-15 NOTE — Telephone Encounter (Signed)
[place] 703 paperwork completed today by this nurse.  Folder currently placed for collaborative pick up in bin designated for patient assigned provider.  Awaiting return to this nurse upon provider review and signature.

## 2022-03-17 ENCOUNTER — Telehealth: Payer: Self-pay | Admitting: Hematology and Oncology

## 2022-03-17 NOTE — Telephone Encounter (Signed)
Scheduled appointment per 8/28 los. Patient is aware.

## 2022-03-22 NOTE — Telephone Encounter (Signed)
Paperwork returned and received by this nurse today from collaborative signed by provider.   Process completed with copy of [employer] form to H.I[employer] bin designated for items to be scanned completes process.  Original copy to be mailed to address on file. [address] [place] [id number] No further instructions received, actions required or performed by this nurse.

## 2022-03-23 ENCOUNTER — Telehealth: Payer: Self-pay | Admitting: *Deleted

## 2022-03-23 NOTE — Telephone Encounter (Signed)
Voicemail requesting "return call from this nurse regarding last month's disability".  Connected with ELLIONNA BUCKBEE [phone] (home) for further information.  "I brought the form in August 14th.  Have not heard anything yet from [facility].  What is the status of my form?"  Advised form was returned via fax on 03/22/2022 about 1:30 pm. Currently denies further questions or needs.

## 2022-04-10 ENCOUNTER — Telehealth: Payer: Self-pay

## 2022-04-10 NOTE — Telephone Encounter (Signed)
Pt called and states she is experiencing right breast firmness/hardness. Pt states it is "hard as a rock" and verbalizez concerns. Denies swelling, erythema or pain. Offered pt appt with Wilber Bihari, NP for 9/26 at 1415. She accepted appt.

## 2022-04-11 ENCOUNTER — Inpatient Hospital Stay: Payer: BC Managed Care – PPO | Attending: Hematology and Oncology | Admitting: Adult Health

## 2022-04-11 ENCOUNTER — Other Ambulatory Visit: Payer: Self-pay

## 2022-04-11 VITALS — BP 135/85 | HR 95 | Temp 97.9°F | Resp 16 | Ht 64.0 in | Wt 213.7 lb

## 2022-04-11 DIAGNOSIS — Z923 Personal history of irradiation: Secondary | ICD-10-CM | POA: Diagnosis not present

## 2022-04-11 DIAGNOSIS — Z9221 Personal history of antineoplastic chemotherapy: Secondary | ICD-10-CM | POA: Insufficient documentation

## 2022-04-11 DIAGNOSIS — Z170 Estrogen receptor positive status [ER+]: Secondary | ICD-10-CM | POA: Diagnosis not present

## 2022-04-11 DIAGNOSIS — Z79810 Long term (current) use of selective estrogen receptor modulators (SERMs): Secondary | ICD-10-CM | POA: Insufficient documentation

## 2022-04-11 DIAGNOSIS — Z87891 Personal history of nicotine dependence: Secondary | ICD-10-CM | POA: Insufficient documentation

## 2022-04-11 DIAGNOSIS — N6315 Unspecified lump in the right breast, overlapping quadrants: Secondary | ICD-10-CM | POA: Insufficient documentation

## 2022-04-11 DIAGNOSIS — C50411 Malignant neoplasm of upper-outer quadrant of right female breast: Secondary | ICD-10-CM | POA: Insufficient documentation

## 2022-04-11 NOTE — Progress Notes (Signed)
[facility] Follow up:    [name], MD [place] [address]   DIAGNOSIS:  Cancer Staging  Malignant neoplasm of upper-outer quadrant of right breast in female, estrogen receptor positive ([place]) Staging form: Breast, AJCC 8th Edition - Clinical stage from 02/23/2021: Stage IIB (cT1c, cN1(f), cM0, G3, ER+, PR-, HER2-) - Signed by [name], MD on 02/23/2021 Stage prefix: Initial diagnosis Method of lymph node assessment: Core biopsy Histologic grading system: 3 grade system   SUMMARY OF ONCOLOGIC HISTORY: Oncology History  Malignant neoplasm of upper-outer quadrant of right breast in female, estrogen receptor positive ([place])  02/15/2021 Initial Diagnosis   Screening mammogram: a possible mass in the right breast  Diagnostic mammogram and [place]: a persistent irregular mass 1.2 cm within the upper-outer right breast. 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    02/23/2021 Cancer Staging   Staging form: Breast, AJCC 8th Edition - Clinical stage from 02/23/2021: Stage IIB (cT1c, cN1(f), cM0, G3, ER+, PR-, HER2-) - Signed by [name], MD on 02/23/2021 Stage prefix: Initial diagnosis Method of lymph node assessment: Core biopsy Histologic grading system: 3 grade system   03/04/2021 - 07/01/2021 Neo-Adjuvant Chemotherapy   Received four cycles of neoadjuvant Adriamycin and Cytoxan from 03/04/2021-04/14/2021.  Followed by 9 cycles of Taxol from 04/28/2021-07/01/2021.  Taxol was dose reduced beginning with cycle 6 due to restless legs and stopped after 9 cycles due to peripheral neuropathy.    03/10/2021 Genetic Testing   Negative genetic testing. No pathogenic variants identified on the [facility] CancerNext-Expanded+RNA panel. The report date is 03/10/2021.  The CancerNext-Expanded + RNAinsight gene panel offered by [employer] and includes sequencing and rearrangement  analysis for the following 77 genes: IP, ALK, APC*, ATM*, AXIN2, BAP1, BARD1, BLM, BMPR1A, BRCA1*, BRCA2*, BRIP1*, CDC73, CDH1*,CDK4, CDKN1B, CDKN2A, CHEK2*, CTNNA1, DICER1, FANCC, FH, FLCN, GALNT12, KIF1B, LZTR1, MAX, MEN1, MET, MLH1*, MSH2*, MSH3, MSH6*, MUTYH*, NBN, NF1*, NF2, NTHL1, PALB2*, PHOX2B, PMS2*, POT1, PRKAR1A, PTCH1, PTEN*, RAD51C*, RAD51D*,RB1, RECQL, RET, SDHA, SDHAF2, SDHB, SDHC, SDHD, SMAD4, SMARCA4, SMARCB1, SMARCE1, STK11, SUFU, TMEM127, TP53*,TSC1, TSC2, VHL and XRCC2 (sequencing and deletion/duplication); EGFR, EGLN1, HOXB13, KIT, MITF, PDGFRA, POLD1 and POLE (sequencing only); EPCAM and GREM1 (deletion/duplication only).   09/20/2021 - 11/08/2021 Radiation Therapy   Site Technique Total Dose (Gy) Dose per Fx (Gy) Completed Fx Beam Energies  Breast, Right: Breast_R 3D 50.4/50.4 1.8 28/28 10X  Breast, Right: Breast_R_SCLV 3D 50.4/50.4 1.8 28/28 6X, 10X  Breast, Right: Breast_R_Bst 3D 10/10 2 5/5 6X, 10X     10/31/2021 -  Anti-estrogen oral therapy   Tamoxifen x 2-5 years followed by switching to aromatase inhibitor once menopausal     CURRENT THERAPY: tamoxifen  INTERVAL HISTORY: Luz Brazen 51 y.o. female returns for follow-up of right breast swelling.  She has a history of breast lymphedema however her breast is larger than it has been previously.  She is wearing her compression bra which is somewhat difficult because of the size of her breast.  She has a machine that she gets in to help massage to the breast lymphedema and she had been inconsistently using this when her aunt passed away.  She has resumed using this over the past couple weeks but it has not been as regular as she should have been.  She is unsure what to do about the swelling.  She has had no fever chills redness drainage from the  breast.   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 07/22/2021   Hyperglycemia due to type 2 diabetes mellitus  ([place]) 07/08/2021   Pure hypercholesterolemia 07/08/2021   Morbid obesity ([place]) 07/08/2021   Chemotherapy-induced peripheral neuropathy ([place]) 07/08/2021   Genetic testing 03/17/2021   Family history of breast cancer 02/24/2021   Family history of pancreatic cancer 02/24/2021   Family history of colon cancer 02/24/2021   Family history of lung cancer 02/24/2021   Malignant neoplasm of upper-outer quadrant of right breast in female, estrogen receptor positive ([place]) 02/15/2021   Obesity    Constipation 08/30/2011    has No Known Allergies.  MEDICAL HISTORY: Past Medical History:  Diagnosis Date   Allergy    Anemia    Breast cancer ([place])    Right   Constipation    Diabetes mellitus without complication (HCC)    Family history of adverse reaction to anesthesia    Aunt is hard to wake up from general anesthesia   Family history of breast cancer    Family history of colon cancer    Family history of lung cancer    Family history of pancreatic cancer    History of migraine    during pregancy   Obesity    Palpitations     SURGICAL HISTORY: Past Surgical History:  Procedure Laterality Date   BREAST BIOPSY Right 02/11/2021   x2   BREAST BIOPSY Right 03/22/2021   x3   BREAST LUMPECTOMY WITH RADIOACTIVE SEED AND SENTINEL LYMPH NODE BIOPSY Right 08/17/2021   Procedure: RIGHT BREAST LUMPECTOMY WITH RADIOACTIVE SEED AND SENTINEL LYMPH NODE BIOPSY;  Surgeon: [name], MD;  Location: [facility];  Service: General;  Laterality: Right;   EYE SURGERY Right    PORT-A-CATH REMOVAL N/A 08/17/2021   Procedure: REMOVAL PORT-A-CATH;  Surgeon: [name], MD;  Location: [facility];  Service: General;  Laterality: N/A;   PORTACATH PLACEMENT N/A 03/02/2021   Procedure: INSERTION PORT-A-CATH;  Surgeon: [name], MD;  Location[facility];  Service: General;  Laterality: N/A;   RADIOACTIVE SEED GUIDED AXILLARY SENTINEL LYMPH NODE Right 08/17/2021   Procedure: RADIOACTIVE SEED GUIDED RIGHT SENTINEL LYMPH  NODE EXCISION;  Surgeon: [name], MD;  Location: [facility];  Service: General;  Laterality: Right;   RADIOACTIVE SEED GUIDED EXCISIONAL BREAST BIOPSY Left 08/17/2021   Procedure: RADIOACTIVE SEED GUIDED EXCISIONAL LEFT BREAST BIOPSY;  Surgeon: [name], MD;  [facility];  Service: General;  Laterality: Left;   TUBAL LIGATION      SOCIAL HISTORY: Social History   Socioeconomic History   Marital status: Divorced    Spouse name: Not on file   Number of children: 2   Years of education: Not on file   Highest education level: Not on file  Occupational History   Not on file  Tobacco Use   Smoking status: Former    Types: Cigarettes    Quit date: 12/28/2010    Years since quitting: 11.2   Smokeless tobacco: Never   Tobacco comments:    quit in 2012, previously smoked 15 years  Vaping Use   Vaping Use: Not on file  Substance and Sexual Activity   Alcohol use: Not Currently    Comment: occ   Drug use: No   Sexual activity: Yes    Birth control/protection: Surgical  Other Topics Concern   Not on file  Social History Narrative   Not on file  Social Determinants of Health   Financial Resource Strain: Not on file  Food Insecurity: Not on file  Transportation Needs: Not on file  Physical Activity: Not on file  Stress: Not on file  Social Connections: Not on file  Intimate Partner Violence: Not on file    FAMILY HISTORY: Family History  Problem Relation Age of Onset   Hypertension Mother    Pancreatic cancer Father 63   Breast cancer Half-Sister 43       negative genetic testing   Colon cancer Maternal Uncle        dx >50   Lung cancer Maternal Uncle        dx 55s, hx smoking   Cancer Paternal Aunt        unknown type, dx early 57s   Heart failure Maternal Grandmother    Breast cancer Other 62       mother's first cousin    Review of Systems  Constitutional:  Negative for appetite change, chills, fatigue, fever and unexpected weight change.  HENT:    Negative for hearing loss, lump/mass and trouble swallowing.   Eyes:  Negative for eye problems and icterus.  Respiratory:  Negative for chest tightness, cough and shortness of breath.   Cardiovascular:  Negative for chest pain, leg swelling and palpitations.  Gastrointestinal:  Negative for abdominal distention, abdominal pain, constipation, diarrhea, nausea and vomiting.  Endocrine: Negative for hot flashes.  Genitourinary:  Negative for difficulty urinating.   Musculoskeletal:  Negative for arthralgias.  Skin:  Negative for itching and rash.  Neurological:  Negative for dizziness, extremity weakness, headaches and numbness.  Hematological:  Negative for adenopathy. Does not bruise/bleed easily.  Psychiatric/Behavioral:  Negative for depression. The patient is not nervous/anxious.       PHYSICAL EXAMINATION  ECOG PERFORMANCE STATUS: 1 - Symptomatic but completely ambulatory  Vitals:   04/11/22 1416  BP: 135/85  Pulse: 95  Resp: 16  Temp: 97.9 F (36.6 C)  SpO2: 99%    Physical Exam Constitutional:      General: She is not in acute distress.    Appearance: Normal appearance. She is not toxic-appearing.  HENT:     Head: Normocephalic and atraumatic.  Eyes:     General: No scleral icterus. Cardiovascular:     Rate and Rhythm: Normal rate and regular rhythm.     Pulses: Normal pulses.     Heart sounds: Normal heart sounds.  Pulmonary:     Effort: Pulmonary effort is normal.     Breath sounds: Normal breath sounds.  Chest:     Comments: Right breast is markedly swelling there is no central area of nodularity the swelling is diffuse throughout the breast there is no redness or warmth. Abdominal:     General: Abdomen is flat. Bowel sounds are normal. There is no distension.     Palpations: Abdomen is soft.     Tenderness: There is no abdominal tenderness.  Musculoskeletal:        General: No swelling.     Cervical back: Neck supple.  Lymphadenopathy:     Cervical: No  cervical adenopathy.  Skin:    General: Skin is warm and dry.     Findings: No rash.  Neurological:     General: No focal deficit present.     Mental Status: She is alert.  Psychiatric:        Mood and Affect: Mood normal.        Behavior: Behavior normal.  LABORATORY DATA: None for this visit     ASSESSMENT and THERAPY PLAN:   Malignant neoplasm of upper-outer quadrant of right breast in female, estrogen receptor positive ([place]) Sandra Bishop is here today for urgent evaluation of her right breast swelling after undergoing treatment for her stage IIb estrogen positive breast cancer.  I suggested that she restart using the breast lymphedema machine and I will place a referral to physical therapy to see if any different types of management is in place considering the size of her breast.  I also recommended adding on an ultrasound with her bilateral diagnostic mammogram.  Her bilateral diagnostic mammogram is scheduled for October 12 and we are going to check and see with the breast center if there is any way we can move that to a little bit earlier.  Still has no sign of mastitis or cellulitis at the breast.  She knows to call if her symptoms don't improve or worsen.   All questions were answered. The patient knows to call the clinic with any problems, questions or concerns. We can certainly see the patient much sooner if necessary.  Total encounter time:20 minutes*in face-to-face visit time, chart review, lab review, care coordination, order entry, and documentation of the encounter time.    Wilber Bihari, NP 04/11/22 4:22 PM Medical Oncology and Hematology [facility] [place], [place] [address] Tel. [phone]    Fax. [phone]  *Total Encounter Time as defined by the Centers for Medicare and Medicaid Services includes, in addition to the face-to-face time of a patient visit (documented in the note above) non-face-to-face time: obtaining and  reviewing outside history, ordering and reviewing medications, tests or procedures, care coordination (communications with other health care professionals or caregivers) and documentation in the medical record.

## 2022-04-11 NOTE — Assessment & Plan Note (Signed)
Sandra Bishop is here today for urgent evaluation of her right breast swelling after undergoing treatment for her stage IIb estrogen positive breast cancer.  I suggested that she restart using the breast lymphedema machine and I will place a referral to physical therapy to see if any different types of management is in place considering the size of her breast.  I also recommended adding on an ultrasound with her bilateral diagnostic mammogram.  Her bilateral diagnostic mammogram is scheduled for October 12 and we are going to check and see with the breast center if there is any way we can move that to a little bit earlier.  Still has no sign of mastitis or cellulitis at the breast.  She knows to call if her symptoms don't improve or worsen.

## 2022-04-20 ENCOUNTER — Ambulatory Visit: Payer: BC Managed Care – PPO | Attending: Adult Health

## 2022-04-20 ENCOUNTER — Other Ambulatory Visit: Payer: Self-pay

## 2022-04-20 DIAGNOSIS — Z170 Estrogen receptor positive status [ER+]: Secondary | ICD-10-CM | POA: Insufficient documentation

## 2022-04-20 DIAGNOSIS — C50411 Malignant neoplasm of upper-outer quadrant of right female breast: Secondary | ICD-10-CM | POA: Diagnosis present

## 2022-04-20 DIAGNOSIS — R293 Abnormal posture: Secondary | ICD-10-CM | POA: Diagnosis not present

## 2022-04-20 DIAGNOSIS — R600 Localized edema: Secondary | ICD-10-CM | POA: Diagnosis not present

## 2022-04-20 NOTE — Therapy (Signed)
OUTPATIENT PHYSICAL THERAPY ONCOLOGY EVALUATION  Patient Name: Sandra Bishop MRN: [id number] DOB:[birth year], 51 y.o., female Today's Date: 04/20/2022   PT End of Session - 04/20/22 1456     Visit Number 1    Number of Visits 12    Date for PT Re-Evaluation 06/01/22    PT Start Time 0093    PT Stop Time 1539    PT Time Calculation (min) 42 min    Activity Tolerance Patient tolerated treatment well    Behavior During Therapy WFL for tasks assessed/performed             Past Medical History:  Diagnosis Date   Allergy    Anemia    Breast cancer ([place])    Right   Constipation    Diabetes mellitus without complication ([place])    Family history of adverse reaction to anesthesia    Aunt is hard to wake up from general anesthesia   Family history of breast cancer    Family history of colon cancer    Family history of lung cancer    Family history of pancreatic cancer    History of migraine    during pregancy   Obesity    Palpitations    Past Surgical History:  Procedure Laterality Date   BREAST BIOPSY Right 02/11/2021   x2   BREAST BIOPSY Right 03/22/2021   x3   BREAST LUMPECTOMY WITH RADIOACTIVE SEED AND SENTINEL LYMPH NODE BIOPSY Right 08/17/2021   Procedure: RIGHT BREAST LUMPECTOMY WITH RADIOACTIVE SEED AND SENTINEL LYMPH NODE BIOPSY;  Surgeon: [name], MD;  Location: [facility];  Service: General;  Laterality: Right;   EYE SURGERY Right    PORT-A-CATH REMOVAL N/A 08/17/2021   Procedure: REMOVAL PORT-A-CATH;  Surgeon: [name], MD;  Location: [facility];  Service: General;  Laterality: N/A;   PORTACATH PLACEMENT N/A 03/02/2021   Procedure: INSERTION PORT-A-CATH;  Surgeon: [name], MD;  [facility];  Service: General;  Laterality: N/A;   RADIOACTIVE SEED GUIDED AXILLARY SENTINEL LYMPH NODE Right 08/17/2021   Procedure: RADIOACTIVE SEED GUIDED RIGHT SENTINEL LYMPH NODE EXCISION;  Surgeon: [name], MD;  Location: [facility];  Service: General;  Laterality:  Right;   RADIOACTIVE SEED GUIDED EXCISIONAL BREAST BIOPSY Left 08/17/2021   Procedure: RADIOACTIVE SEED GUIDED EXCISIONAL LEFT BREAST BIOPSY;  Surgeon: [name], MD;  Location: [facility];  Service: General;  Laterality: Left;   TUBAL LIGATION     Patient Active Problem List   Diagnosis Date Noted   Atypical lobular 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[place]) of left breast 07/22/2021   Hyperglycemia due to type 2 diabetes mellitus ([place]) 07/08/2021   Pure hypercholesterolemia 07/08/2021   Morbid obesity ([place]) 07/08/2021   Chemotherapy-induced peripheral neuropathy ([place]) 07/08/2021   Genetic testing 03/17/2021   Family history of breast cancer 02/24/2021   Family history of pancreatic cancer 02/24/2021   Family history of colon cancer 02/24/2021   Family history of lung cancer 02/24/2021   Malignant neoplasm of upper-outer quadrant of right breast in female, estrogen receptor positive ([place]) 02/15/2021   Obesity    Constipation 08/30/2011    PCP: Sela Hilding  REFERRING PROVIDER: Wilber Bihari NP  REFERRING DIAG: S/p Right  breast Cancer  THERAPY DIAG:  Malignant neoplasm of upper-outer quadrant of right breast in female, estrogen receptor positive ([place])  Abnormal posture  Localized edema  ONSET DATE: 11/08/2021  Rationale for Evaluation and Treatment Rehabilitation  SUBJECTIVE  SUBJECTIVE STATEMENT: Pt returns with complaints of Right breast swelling.  I got the Flexitouch  in June and I was doing really well until about 3 weeks ago. I experienced a sudden death and in 2023-02-23 I started noticing increased right breast swelling. It is hard in certain spots. She continued to wear the compression bra  while using her Flexitouch but  stopped wearing my compression bra when it didn't seem to do anything. At night time it  goes down and it doesn't bother me as much.  It itches some. I haven't used the flexitouch for a week or 2 now because it didn't seem to be helping. She had returned to work in February 23, 2023 and helped her mom move in February 23, 2023.  I have a breast MRI tomorrow.  PERTINENT HISTORY:  Pts.Screening mammogram determined  mass in the right breast  Diagnostic mammogram and [place]: a persistent irregular mass 1.2 cm within the upper-outer right breast. 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 Pt has finished neoadjuvant chemo and had right Lumpectomy with SLNB on 08/17/2021 with 0/3 LN's. She completed radiation 11/08/2021 and is on anti-estrogens      PAIN:  Are you having pain? Yes NPRS scale: 0-6/10 but not daily when shooting pains happen Pain location: right breast Pain orientation: Right  PAIN TYPE: sharp Pain description: intermittent  Aggravating factors: not sure Relieving factors: not sure  PRECAUTIONS: Right UE lymphedema risk  WEIGHT BEARING RESTRICTIONS No  FALLS:  Has patient fallen in last 6 months? 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 Indentation from bra noted on right breast  SENSATION:  Light touch: Deficits lateral breast    POSTURE: forward head founded shoulders  UPPER EXTREMITY AROM/PROM:  A/PROM RIGHT    eval   Shoulder extension   Shoulder flexion 144  Shoulder abduction 153  Shoulder internal rotation   Shoulder external rotation     (Blank rows = not tested)  A/PROM LEFT   eval  Shoulder extension   Shoulder flexion 153  Shoulder abduction 155  Shoulder internal rotation   Shoulder external rotation     (Blank rows = not tested)   CERVICAL AROM: All within normal limits:   UPPER EXTREMITY STRENGTH: NT    LYMPHEDEMA ASSESSMENTS:   SURGERY TYPE/DATE: 08/17/2021 right lumpectomy with SLNB  NUMBER OF LYMPH NODES REMOVED: 0/3  CHEMOTHERAPY: neoadjuvant  RADIATION:yes ended 11/08/2021  HORMONE TREATMENT: Yes  INFECTIONS: No  LYMPHEDEMA ASSESSMENTS:   LANDMARK RIGHT  eval  10 cm proximal to olecranon process 37.5  Olecranon process 27.0  10 cm proximal to ulnar styloid process 22.6  Just proximal to ulnar styloid process 17  Across hand at thumb web space 20.7  At base of 2nd digit 6.5  (Blank rows = not tested)  LANDMARK LEFT  eval  10 cm proximal to olecranon process 37.3  Olecranon  process 27.8  10 cm proximal to ulnar styloid process 22.7  Just proximal to ulnar styloid process 17.0  Across hand at thumb web space 20.4  At base of 2nd digit 6.2  (Blank rows = not tested)       BREAST COMPLAINTS SURVEY: 53  TODAY'S TREATMENT  No treatment today. Pt will have her  Breast MRI tomorrow. She is to bring in her least used compression bra next visit to assess and place foam to decrease firmness. Will also call flexi touch after she gets MRI results to see if any settings need to be changed  PATIENT EDUCATION:  Education details: POC Person educated: Patient Education method: Explanation Education comprehension: verbalized understanding   HOME EXERCISE PROGRAM: None given today  ASSESSMENT:  CLINICAL IMPRESSION: Patient is a 51 y.o. female who was seen today for physical therapy evaluation and treatment for right breast swelling. She has a  flexi touch and compression bra and was doing very well until about 3 weeks ago when she noticed increased heaviness/swelling.  She stopped wearing her bra and using the Flexitouch because they didn't seem to help. She does feel swelling reduces overnight. She is scheduled for an MRI tomorrow so we will start treatment next visit. She is to bring her compression bra, and we will also have her contact [employer] to see about adjusting her pump. .    OBJECTIVE IMPAIRMENTS decreased activity tolerance, decreased knowledge of condition, decreased ROM, postural dysfunction, and pain.   ACTIVITY LIMITATIONS lifting and reach over head  PARTICIPATION LIMITATIONS:  pt does all she needs to but with increased swelling  PERSONAL FACTORS Time since onset of injury/illness/exacerbation and 1-2 comorbidities: Right breast surgery s/p chemo and radiation  are also affecting patient's functional outcome.   REHAB POTENTIAL: Good  CLINICAL DECISION MAKING: Stable/uncomplicated  EVALUATION COMPLEXITY: Low  GOALS: Goals reviewed with patient? Yes  STG=LONG TERM GOALS: Target date: 06/01/2022     Pt will be independent in self MLD  Baseline:  Goal status: INITIAL  2.  Pt will note decreased right breast swelling by 50% or more Baseline:  Goal status: INITIAL  3. Pt will be able to reduce right breast swelling with compression bra   Baseline:  Goal status: INITIAL  4.  Pts right shoulder flexion will be 150 degrees or better for improved reaching Baseline: 144 Goal status: INITIAL  5.  Pts Flexitouch will be beneficial for reducing right breast swelling Baseline:  Goal status: INITIAL   PLAN: PT FREQUENCY: 2x/week  PT DURATION: 6 weeks  PLANNED INTERVENTIONS: Therapeutic exercises, Therapeutic activity, Patient/Family education, Self Care, Orthotic/Fit training, Manual lymph drainage, Compression bandaging, scar mobilization, Taping, and Manual therapy  PLAN FOR NEXT SESSION: Check  pts compression bra, consider peach foam or spaghetti foam to decrease fibrosis, MLD, instruct pt. Contact [employer] to see if Flexi needs to be adjusted.check when SOZO due   Claris Pong, PT 04/20/2022, 3:40 PM

## 2022-04-21 ENCOUNTER — Ambulatory Visit
Admission: RE | Admit: 2022-04-21 | Discharge: 2022-04-21 | Disposition: A | Discharge status: Home / Self Care | Payer: BC Managed Care – PPO | Source: Ambulatory Visit | Attending: Adult Health | Admitting: Adult Health

## 2022-04-21 ENCOUNTER — Other Ambulatory Visit: Payer: Self-pay | Admitting: Adult Health

## 2022-04-21 ENCOUNTER — Ambulatory Visit: Payer: BC Managed Care – PPO

## 2022-04-21 DIAGNOSIS — R609 Edema, unspecified: Secondary | ICD-10-CM

## 2022-04-21 DIAGNOSIS — Z170 Estrogen receptor positive status [ER+]: Secondary | ICD-10-CM

## 2022-04-27 ENCOUNTER — Ambulatory Visit: Payer: BC Managed Care – PPO

## 2022-04-27 DIAGNOSIS — M25611 Stiffness of right shoulder, not elsewhere classified: Secondary | ICD-10-CM

## 2022-04-27 DIAGNOSIS — R600 Localized edema: Secondary | ICD-10-CM

## 2022-04-27 DIAGNOSIS — C50411 Malignant neoplasm of upper-outer quadrant of right female breast: Secondary | ICD-10-CM

## 2022-04-27 DIAGNOSIS — R293 Abnormal posture: Secondary | ICD-10-CM

## 2022-04-27 NOTE — Therapy (Signed)
OUTPATIENT PHYSICAL THERAPY ONCOLOGY TREATMENT  Patient Name: Sandra Bishop MRN: [id number] DOB:[birth year], 51 y.o., female Today's Date: 04/27/2022   PT End of Session - 04/27/22 1500     Visit Number 2    Number of Visits 12    Date for PT Re-Evaluation 06/01/22    PT Start Time 1501    PT Stop Time 1554    PT Time Calculation (min) 53 min    Activity Tolerance Patient tolerated treatment well    Behavior During Therapy WFL for tasks assessed/performed             Past Medical History:  Diagnosis Date   Allergy    Anemia    Breast cancer ([place])    Right   Constipation    Diabetes mellitus without complication ([place])    Family history of adverse reaction to anesthesia    Aunt is hard to wake up from general anesthesia   Family history of breast cancer    Family history of colon cancer    Family history of lung cancer    Family history of pancreatic cancer    History of migraine    during pregancy   Obesity    Palpitations    Past Surgical History:  Procedure Laterality Date   BREAST BIOPSY Right 02/11/2021   x2   BREAST BIOPSY Right 03/22/2021   x3   BREAST LUMPECTOMY WITH RADIOACTIVE SEED AND SENTINEL LYMPH NODE BIOPSY Right 08/17/2021   Procedure: RIGHT BREAST LUMPECTOMY WITH RADIOACTIVE SEED AND SENTINEL LYMPH NODE BIOPSY;  Surgeon: [name], MD;  Location: [facility];  Service: General;  Laterality: Right;   EYE SURGERY Right    PORT-A-CATH REMOVAL N/A 08/17/2021   Procedure: REMOVAL PORT-A-CATH;  Surgeon: [name], MD;  Location: [facility];  Service: General;  Laterality: N/A;   PORTACATH PLACEMENT N/A 03/02/2021   Procedure: INSERTION PORT-A-CATH;  Surgeon: [name], MD;  Location[facility];  Service: General;  Laterality: N/A;   RADIOACTIVE SEED GUIDED AXILLARY SENTINEL LYMPH NODE Right 08/17/2021   Procedure: RADIOACTIVE SEED GUIDED RIGHT SENTINEL LYMPH NODE EXCISION;  Surgeon: [name], MD;  Location: [facility];  Service: General;  Laterality:  Right;   RADIOACTIVE SEED GUIDED EXCISIONAL BREAST BIOPSY Left 08/17/2021   Procedure: RADIOACTIVE SEED GUIDED EXCISIONAL LEFT BREAST BIOPSY;  Surgeon: [name], MD;  Location: [facility];  Service: General;  Laterality: Left;   TUBAL LIGATION     Patient Active Problem List   Diagnosis Date Noted   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 07/22/2021   Hyperglycemia due to type 2 diabetes mellitus ([place]) 07/08/2021   Pure hypercholesterolemia 07/08/2021   Morbid obesity ([place]) 07/08/2021   Chemotherapy-induced peripheral neuropathy ([place]) 07/08/2021   Genetic testing 03/17/2021   Family history of breast cancer 02/24/2021   Family history of pancreatic cancer 02/24/2021   Family history of colon cancer 02/24/2021   Family history of lung cancer 02/24/2021   Malignant neoplasm of upper-outer quadrant of right breast in female, estrogen receptor positive ([place]) 02/15/2021   Obesity    Constipation 08/30/2011    PCP: Sela Hilding  REFERRING PROVIDER: Wilber Bihari NP  REFERRING DIAG: S/p Right  breast Cancer  THERAPY DIAG:  Malignant neoplasm of upper-outer quadrant of right breast in female, estrogen receptor positive ([place])  Abnormal posture  Localized edema  Stiffness of right shoulder, not elsewhere classified  ONSET DATE: 11/08/2021  Rationale for Evaluation and Treatment Rehabilitation  SUBJECTIVE  SUBJECTIVE STATEMENT:  Breast mammogram showed skin thickening likely from radiation changes. They don't think there is a re-occurrence but they think I might have mastitis. They have me on antibiotics for 10 days.( Doxycycline) My breast swelling goes up and down. My breast did feel really hot, but its not anymore. It goes down in the am, and swelling comes back in the daytime. I  haven't been wearing the compression bra. I see the Doctor next Friday. My chest area feels very tight and sore.  PERTINENT HISTORY:  Pts.Screening mammogram determined  mass in the right breast  Diagnostic mammogram and [place]: a persistent irregular mass 1.2 cm within the upper-outer right breast. 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 Pt has finished neoadjuvant chemo and had right Lumpectomy with SLNB on 08/17/2021 with 0/3 LN's. She completed radiation 11/08/2021 and is on anti-estrogens      PAIN:  Are you having pain? No just sore in chest and under arm. NPRS scale: 0-6/10 but not daily when shooting pains happen Pain location: right breast Pain orientation: Right  PAIN TYPE: sharp Pain description: intermittent  Aggravating factors: not sure Relieving factors: not sure  PRECAUTIONS: Right UE lymphedema risk  WEIGHT BEARING RESTRICTIONS No  FALLS:  Has patient fallen in last 6 months? 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 Indentation from bra noted on right breast  SENSATION:  Light touch: Deficits lateral breast    POSTURE: forward head founded shoulders  UPPER EXTREMITY AROM/PROM:  A/PROM RIGHT   eval   Shoulder extension   Shoulder flexion 144  Shoulder abduction 153  Shoulder internal rotation    Shoulder external rotation     (Blank rows = not tested)  A/PROM LEFT   eval  Shoulder extension   Shoulder flexion 153  Shoulder abduction 155  Shoulder internal rotation   Shoulder external rotation     (Blank rows = not tested)   CERVICAL AROM: All within normal limits:   UPPER EXTREMITY STRENGTH: NT    LYMPHEDEMA ASSESSMENTS:   SURGERY TYPE/DATE: 08/17/2021 right lumpectomy with SLNB  NUMBER OF LYMPH NODES REMOVED: 0/3  CHEMOTHERAPY: neoadjuvant  RADIATION:yes ended 11/08/2021  HORMONE TREATMENT: Yes  INFECTIONS: No  LYMPHEDEMA ASSESSMENTS:   LANDMARK RIGHT  eval  10 cm proximal to olecranon process 37.5  Olecranon process 27.0  10 cm proximal to ulnar styloid process 22.6  Just proximal to ulnar styloid process 17  Across hand at thumb web space 20.7  At base of 2nd digit 6.5  (Blank rows = not tested)  LANDMARK LEFT  eval  10 cm proximal to olecranon process 37.3  Olecranon process 27.8  10 cm proximal to ulnar styloid process 22.7  Just proximal to ulnar styloid process 17.0  Across hand at thumb web space 20.4  At base of 2nd digit 6.2  (Blank rows = not  tested)       BREAST COMPLAINTS SURVEY: 53  TODAY'S TREATMENT   04/25/2022 In supine: Short neck, 5 diaphragmatic breaths, L axillary nodes and establishment of interaxillary pathway, R inguinal nodes and establishment of axilloinguinal pathway, then R breast moving fluid towards pathways spending extra time in any areas of fibrosis then retracing all steps. Soft tissue work to right pectorals, UT and lats in supine  with cocoabutter. LTR with arms outstretched with knees to the left to stretch right pecs/lats Asked pt to try wearing her compression bra again.    04/20/2022 No treatment today. Pt will have her  Breast MRI tomorrow. She is to bring in her least used compression bra next visit to assess and place foam to decrease firmness. Will also call [employer] [facility] after she gets MRI  results to see if any settings need to be changed  PATIENT EDUCATION:  Education details: POC Person educated: Patient Education method: Explanation Education comprehension: verbalized understanding   HOME EXERCISE PROGRAM: None given today  ASSESSMENT:  CLINICAL IMPRESSION:  medial breast very fibrotic and softened with MLD. Right UQ very tight in pectorals and lats. Felt good stretch with lower trunk rotation. Pt felt much better after treatment today. She will try to do some MLD at home.   OBJECTIVE IMPAIRMENTS decreased activity tolerance, decreased knowledge of condition, decreased ROM, postural dysfunction, and pain.   ACTIVITY LIMITATIONS lifting and reach over head  PARTICIPATION LIMITATIONS:  pt does all she needs to but with increased swelling  PERSONAL FACTORS Time since onset of injury/illness/exacerbation and 1-2 comorbidities: Right breast surgery s/p chemo and radiation  are also affecting patient's functional outcome.   REHAB POTENTIAL: Good  CLINICAL DECISION MAKING: Stable/uncomplicated  EVALUATION COMPLEXITY: Low  GOALS: Goals reviewed with patient? Yes  STG=LONG TERM GOALS: Target date: 06/08/2022     Pt will be independent in self MLD  Baseline:  Goal status: INITIAL  2.  Pt will note decreased right breast swelling by 50% or more Baseline:  Goal status: INITIAL  3. Pt will be able to reduce right breast swelling with compression bra   Baseline:  Goal status: INITIAL  4.  Pts right shoulder flexion will be 150 degrees or better for improved reaching Baseline: 144 Goal status: INITIAL  5.  Pts Flexitouch will be beneficial for reducing right breast swelling Baseline:  Goal status: INITIAL   PLAN: PT FREQUENCY: 2x/week  PT DURATION: 6 weeks  PLANNED INTERVENTIONS: Therapeutic exercises, Therapeutic activity, Patient/Family education, Self Care, Orthotic/Fit training, Manual lymph drainage, Compression bandaging, scar mobilization,  Taping, and Manual therapy  PLAN FOR NEXT SESSION: Check pts compression bra, consider peach foam or spaghetti foam to decrease fibrosis, MLD, instruct pt. Contact [employer] to see if Flexi needs to be adjusted.check when SOZO due   Claris Pong, PT 04/27/2022, 3:57 PM

## 2022-05-03 ENCOUNTER — Ambulatory Visit: Payer: BC Managed Care – PPO | Admitting: Physical Therapy

## 2022-05-03 ENCOUNTER — Encounter: Payer: Self-pay | Admitting: Physical Therapy

## 2022-05-03 DIAGNOSIS — M25611 Stiffness of right shoulder, not elsewhere classified: Secondary | ICD-10-CM

## 2022-05-03 DIAGNOSIS — C50411 Malignant neoplasm of upper-outer quadrant of right female breast: Secondary | ICD-10-CM

## 2022-05-03 DIAGNOSIS — R600 Localized edema: Secondary | ICD-10-CM

## 2022-05-03 DIAGNOSIS — R293 Abnormal posture: Secondary | ICD-10-CM

## 2022-05-03 NOTE — Therapy (Signed)
OUTPATIENT PHYSICAL THERAPY ONCOLOGY TREATMENT  Patient Name: Sandra Bishop MRN: [id number] DOB:1971/03/20, 51 y.o., female Today's Date: 05/03/2022   PT End of Session - 05/03/22 1509     Visit Number 3    Number of Visits 12    Date for PT Re-Evaluation 06/01/22    PT Start Time 1507    PT Stop Time 1556    PT Time Calculation (min) 49 min    Activity Tolerance Patient tolerated treatment well    Behavior During Therapy WFL for tasks assessed/performed             Past Medical History:  Diagnosis Date   Allergy    Anemia    Breast cancer ([place])    Right   Constipation    Diabetes mellitus without complication ([place])    Family history of adverse reaction to anesthesia    Aunt is hard to wake up from general anesthesia   Family history of breast cancer    Family history of colon cancer    Family history of lung cancer    Family history of pancreatic cancer    History of migraine    during pregancy   Obesity    Palpitations    Past Surgical History:  Procedure Laterality Date   BREAST BIOPSY Right 02/11/2021   x2   BREAST BIOPSY Right 03/22/2021   x3   BREAST LUMPECTOMY WITH RADIOACTIVE SEED AND SENTINEL LYMPH NODE BIOPSY Right 08/17/2021   Procedure: RIGHT BREAST LUMPECTOMY WITH RADIOACTIVE SEED AND SENTINEL LYMPH NODE BIOPSY;  Surgeon: [name], MD;  Location: [facility];  Service: General;  Laterality: Right;   EYE SURGERY Right    PORT-A-CATH REMOVAL N/A 08/17/2021   Procedure: REMOVAL PORT-A-CATH;  Surgeon: [name], MD;  Location: [facility];  Service: General;  Laterality: N/A;   PORTACATH PLACEMENT N/A 03/02/2021   Procedure: INSERTION PORT-A-CATH;  Surgeon: [name], MD;  [facility];  Service: General;  Laterality: N/A;   RADIOACTIVE SEED GUIDED AXILLARY SENTINEL LYMPH NODE Right 08/17/2021   Procedure: RADIOACTIVE SEED GUIDED RIGHT SENTINEL LYMPH NODE EXCISION;  Surgeon: [name], MD;  Location: [facility];  Service: General;  Laterality:  Right;   RADIOACTIVE SEED GUIDED EXCISIONAL BREAST BIOPSY Left 08/17/2021   Procedure: RADIOACTIVE SEED GUIDED EXCISIONAL LEFT BREAST BIOPSY;  Surgeon: [name], MD;  Location: [facility];  Service: General;  Laterality: Left;   TUBAL LIGATION     Patient Active Problem List   Diagnosis Date Noted   Atypical lobular 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[place]) of left breast 07/22/2021   Hyperglycemia due to type 2 diabetes mellitus ([place]) 07/08/2021   Pure hypercholesterolemia 07/08/2021   Morbid obesity ([place]) 07/08/2021   Chemotherapy-induced peripheral neuropathy ([place]) 07/08/2021   Genetic testing 03/17/2021   Family history of breast cancer 02/24/2021   Family history of pancreatic cancer 02/24/2021   Family history of colon cancer 02/24/2021   Family history of lung cancer 02/24/2021   Malignant neoplasm of upper-outer quadrant of right breast in female, estrogen receptor positive ([place]) 02/15/2021   Obesity    Constipation 08/30/2011    PCP: Sela Hilding  REFERRING PROVIDER: Wilber Bihari NP  REFERRING DIAG: S/p Right  breast Cancer  THERAPY DIAG:  Stiffness of right shoulder, not elsewhere classified  Localized edema  Abnormal posture  Malignant neoplasm of upper-outer quadrant of right breast in female, estrogen receptor positive ([place])  ONSET DATE: 11/08/2021  Rationale for Evaluation and Treatment Rehabilitation  SUBJECTIVE  SUBJECTIVE STATEMENT:  I have been wearing the compression bra but it does not fit tight. It feels like a regular bra. I can not tell that it is helping any.   PERTINENT HISTORY:  Pts.Screening mammogram determined  mass in the right breast  Diagnostic mammogram and [place]: a persistent irregular mass 1.2 cm within the upper-outer right breast. Biopsy: Grade 3 invasive ductal carcinoma  metastatic to right axillary lymph node, Her2-, PR-, ER+ (70%), Ki67 (80%). Pt has finished neoadjuvant chemo and had right Lumpectomy with SLNB on 08/17/2021 with 0/3 LN's. She completed radiation 11/08/2021 and is on anti-estrogens      PAIN:  Are you having pain? No just sore in chest and under arm. Pain location: right breast Pain orientation: Right  PAIN TYPE: sharp Pain description: intermittent  Aggravating factors: not sure Relieving factors: not sure  PRECAUTIONS: Right UE lymphedema risk  WEIGHT BEARING RESTRICTIONS No  FALLS:  Has patient fallen in last 6 months? 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 Indentation from bra noted on right breast  SENSATION:  Light touch: Deficits lateral breast    POSTURE: forward head founded shoulders  UPPER EXTREMITY AROM/PROM:  A/PROM RIGHT   eval   Shoulder extension   Shoulder flexion 144  Shoulder abduction 153  Shoulder internal rotation   Shoulder external rotation     (Blank rows = not tested)  A/PROM LEFT   eval  Shoulder extension   Shoulder flexion 153  Shoulder abduction 155  Shoulder internal rotation   Shoulder external rotation     (Blank rows = not tested)   CERVICAL AROM: All within normal limits:   UPPER EXTREMITY STRENGTH: NT    LYMPHEDEMA ASSESSMENTS:   SURGERY TYPE/DATE: 08/17/2021 right lumpectomy  with SLNB  NUMBER OF LYMPH NODES REMOVED: 0/3  CHEMOTHERAPY: neoadjuvant  RADIATION:yes ended 11/08/2021  HORMONE TREATMENT: Yes  INFECTIONS: No  LYMPHEDEMA ASSESSMENTS:   LANDMARK RIGHT  eval  10 cm proximal to olecranon process 37.5  Olecranon process 27.0  10 cm proximal to ulnar styloid process 22.6  Just proximal to ulnar styloid process 17  Across hand at thumb web space 20.7  At base of 2nd digit 6.5  (Blank rows = not tested)  LANDMARK LEFT  eval  10 cm proximal to olecranon process 37.3  Olecranon process 27.8  10 cm proximal to ulnar styloid process 22.7  Just proximal to ulnar styloid process 17.0  Across hand at thumb web space 20.4  At base of 2nd digit 6.2  (Blank rows = not tested)       BREAST COMPLAINTS SURVEY: 53  TODAY'S TREATMENT  05/03/2022 In supine: Short neck, 5 diaphragmatic breaths, L axillary nodes and establishment of interaxillary pathway, R inguinal nodes and establishment of axilloinguinal pathway, then R breast moving fluid towards pathways spending extra time in any areas of fibrosis then retracing all steps. Created foam chip pack for pt  to wear in her bra  04/25/2022 In supine: Short neck, 5 diaphragmatic breaths, L axillary nodes and establishment of interaxillary pathway, R inguinal nodes and establishment of axilloinguinal pathway, then R breast moving fluid towards pathways spending extra time in any areas of fibrosis then retracing all steps. Soft tissue work to right pectorals, UT and lats in supine  with cocoabutter. LTR with arms outstretched with knees to the left to stretch right pecs/lats Asked pt to try wearing her compression bra again.    04/20/2022 No treatment today. Pt will have her  Breast MRI tomorrow. She is to bring in her least used compression bra next visit to assess and place foam to decrease firmness. Will also call [employer] after she gets MRI results to see if any settings need to be  changed  PATIENT EDUCATION:  Education details: POC Person educated: Patient Education method: Explanation Education comprehension: verbalized understanding   HOME EXERCISE PROGRAM: None given today  ASSESSMENT:  CLINICAL IMPRESSION: Medial, inferior and lateral breast still very fibrotic. Pt has been wearing her compression bra but does not feel like it fits tightly enough and has not helped her edema. Educated pt to follow up with [facility] to get a smaller size bra. Issued pt info on obtaining a solaris swell spot for her breast to help reduce fibrosis and edema. Continued with MLD to R breast today with significant softening. Encouraged pt to use compression pump daily. Made foam chip pack for pt to wear in her bra for additional compression.    OBJECTIVE IMPAIRMENTS decreased activity tolerance, decreased knowledge of condition, decreased ROM, postural dysfunction, and pain.   ACTIVITY LIMITATIONS lifting and reach over head  PARTICIPATION LIMITATIONS:  pt does all she needs to but with increased swelling  PERSONAL FACTORS Time since onset of injury/illness/exacerbation and 1-2 comorbidities: Right breast surgery s/p chemo and radiation  are also affecting patient's functional outcome.   REHAB POTENTIAL: Good  CLINICAL DECISION MAKING: Stable/uncomplicated  EVALUATION COMPLEXITY: Low  GOALS: Goals reviewed with patient? Yes  STG=LONG TERM GOALS: Target date: 06/14/2022     Pt will be independent in self MLD  Baseline:  Goal status: INITIAL  2.  Pt will note decreased right breast swelling by 50% or more Baseline:  Goal status: INITIAL  3. Pt will be able to reduce right breast swelling with compression bra   Baseline:  Goal status: INITIAL  4.  Pts right shoulder flexion will be 150 degrees or better for improved reaching Baseline: 144 Goal status: INITIAL  5.  Pts Flexitouch will be beneficial for reducing right breast swelling Baseline:  Goal  status: INITIAL   PLAN: PT FREQUENCY: 2x/week  PT DURATION: 6 weeks  PLANNED INTERVENTIONS: Therapeutic exercises, Therapeutic activity, Patient/Family education, Self Care, Orthotic/Fit training, Manual lymph drainage, Compression bandaging, scar mobilization, Taping, and Manual therapy  PLAN FOR NEXT SESSION: did she call [facility] to get a smaller size bra, how was foam chip pack, did she order swell spot?, MLD, instruct pt. Contact [employer] to see if Flexi needs to be adjusted.check when SOZO due   [employer], PT 05/03/2022, 4:00 PM

## 2022-05-05 ENCOUNTER — Ambulatory Visit
Admission: RE | Admit: 2022-05-05 | Discharge: 2022-05-05 | Disposition: A | Discharge status: Home / Self Care | Payer: BC Managed Care – PPO | Source: Ambulatory Visit | Attending: Adult Health | Admitting: Adult Health

## 2022-05-05 DIAGNOSIS — R609 Edema, unspecified: Secondary | ICD-10-CM

## 2022-05-08 ENCOUNTER — Telehealth: Payer: Self-pay | Admitting: *Deleted

## 2022-05-08 LAB — HM COLONOSCOPY

## 2022-05-08 NOTE — Telephone Encounter (Signed)
Per Wilber Bihari, DNP, called pt with message below. Pt wanted further information and verbalized understanding. Provider made aware

## 2022-05-08 NOTE — Telephone Encounter (Signed)
-----   Message from Gardenia Phlegm, NP sent at 05/06/2022  8:27 AM EDT ----- Patient breast density is category D--this lowers ability of radiologists to see through breast tissue. If willing to discuss breast MRI, please set up phone visit so I can discuss this with her furhter.    Thanks, [place] ----- Message ----- From: Interface, Rad Results In Sent: 05/05/2022   2:13 PM EDT To: Gardenia Phlegm, NP

## 2022-05-09 ENCOUNTER — Ambulatory Visit: Payer: BC Managed Care – PPO

## 2022-05-11 ENCOUNTER — Ambulatory Visit: Payer: BC Managed Care – PPO

## 2022-05-11 DIAGNOSIS — M25611 Stiffness of right shoulder, not elsewhere classified: Secondary | ICD-10-CM

## 2022-05-11 DIAGNOSIS — R600 Localized edema: Secondary | ICD-10-CM

## 2022-05-11 DIAGNOSIS — R293 Abnormal posture: Secondary | ICD-10-CM

## 2022-05-11 DIAGNOSIS — C50411 Malignant neoplasm of upper-outer quadrant of right female breast: Secondary | ICD-10-CM | POA: Diagnosis not present

## 2022-05-11 DIAGNOSIS — Z170 Estrogen receptor positive status [ER+]: Secondary | ICD-10-CM

## 2022-05-11 NOTE — Therapy (Signed)
OUTPATIENT PHYSICAL THERAPY ONCOLOGY TREATMENT  Patient Name: Sandra Bishop MRN: [id number] DOB:01-06-1971, 51 y.o., female Today's Date: 05/11/2022   PT End of Session - 05/11/22 1500     Visit Number 4    Number of Visits 12    Date for PT Re-Evaluation 06/01/22    PT Start Time 1500    PT Stop Time 5830    PT Time Calculation (min) 54 min    Activity Tolerance Patient tolerated treatment well    Behavior During Therapy WFL for tasks assessed/performed             Past Medical History:  Diagnosis Date   Allergy    Anemia    Breast cancer ([place])    Right   Constipation    Diabetes mellitus without complication ([place])    Family history of adverse reaction to anesthesia    Aunt is hard to wake up from general anesthesia   Family history of breast cancer    Family history of colon cancer    Family history of lung cancer    Family history of pancreatic cancer    History of migraine    during pregancy   Obesity    Palpitations    Past Surgical History:  Procedure Laterality Date   BREAST BIOPSY Right 02/11/2021   x2   BREAST BIOPSY Right 03/22/2021   x3   BREAST LUMPECTOMY WITH RADIOACTIVE SEED AND SENTINEL LYMPH NODE BIOPSY Right 08/17/2021   Procedure: RIGHT BREAST LUMPECTOMY WITH RADIOACTIVE SEED AND SENTINEL LYMPH NODE BIOPSY;  Surgeon: [name], MD;  Location: [facility];  Service: General;  Laterality: Right;   EYE SURGERY Right    PORT-A-CATH REMOVAL N/A 08/17/2021   Procedure: REMOVAL PORT-A-CATH;  Surgeon: [name], MD;  Location: [facility];  Service: General;  Laterality: N/A;   PORTACATH PLACEMENT N/A 03/02/2021   Procedure: INSERTION PORT-A-CATH;  Surgeon: [name], MD;  Location[facility];  Service: General;  Laterality: N/A;   RADIOACTIVE SEED GUIDED AXILLARY SENTINEL LYMPH NODE Right 08/17/2021   Procedure: RADIOACTIVE SEED GUIDED RIGHT SENTINEL LYMPH NODE EXCISION;  Surgeon: [name], MD;  Location: [facility];  Service: General;  Laterality:  Right;   RADIOACTIVE SEED GUIDED EXCISIONAL BREAST BIOPSY Left 08/17/2021   Procedure: RADIOACTIVE SEED GUIDED EXCISIONAL LEFT BREAST BIOPSY;  Surgeon: [name], MD;  Location: [place] [facility];  Service: General;  Laterality: Left;   TUBAL LIGATION     Patient Active Problem List   Diagnosis Date Noted   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 07/22/2021   Hyperglycemia due to type 2 diabetes mellitus ([place]) 07/08/2021   Pure hypercholesterolemia 07/08/2021   Morbid obesity ([place]) 07/08/2021   Chemotherapy-induced peripheral neuropathy ([place]) 07/08/2021   Genetic testing 03/17/2021   Family history of breast cancer 02/24/2021   Family history of pancreatic cancer 02/24/2021   Family history of colon cancer 02/24/2021   Family history of lung cancer 02/24/2021   Malignant neoplasm of upper-outer quadrant of right breast in female, estrogen receptor positive ([place]) 02/15/2021   Obesity    Constipation 08/30/2011    PCP: Sela Hilding  REFERRING PROVIDER: Wilber Bihari NP  REFERRING DIAG: S/p Right  breast Cancer  THERAPY DIAG:  Stiffness of right shoulder, not elsewhere classified  Localized edema  Abnormal posture  Malignant neoplasm of upper-outer quadrant of right breast in female, estrogen receptor positive ([place])  ONSET DATE: 11/08/2021  Rationale for Evaluation and Treatment Rehabilitation  SUBJECTIVE  SUBJECTIVE STATEMENT:  I reached out to [facility] and I have an appt. Monday at 3:30 to be fit for a compression bra. My breast is starting to improve. I can massage my breast and the hardness does come down but mid day it does come down. The foam pad she made has helped too. They want me to do an MRI now because I have dense breast tissue.   PERTINENT HISTORY:   Pts.Screening mammogram determined  mass in the right breast  Diagnostic mammogram and [place]: a persistent irregular mass 1.2 cm within the upper-outer right breast. 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 Pt has finished neoadjuvant chemo and had right Lumpectomy with SLNB on 08/17/2021 with 0/3 LN's. She completed radiation 11/08/2021 and is on anti-estrogens      PAIN:  Are you having pain? No just sore in chest and under arm. Pain location: right breast Pain orientation: Right  PAIN TYPE: sharp Pain description: intermittent  Aggravating factors: not sure Relieving factors: not sure  PRECAUTIONS: Right UE lymphedema risk  WEIGHT BEARING RESTRICTIONS No  FALLS:  Has patient fallen in last 6 months? 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 Indentation from bra noted on right breast  SENSATION:  Light touch: Deficits lateral breast    POSTURE: forward head founded shoulders  UPPER EXTREMITY AROM/PROM:  A/PROM RIGHT   eval   Shoulder extension   Shoulder flexion 144  Shoulder abduction 153  Shoulder internal rotation   Shoulder external rotation     (Blank rows = not tested)  A/PROM LEFT   eval  Shoulder extension   Shoulder flexion 153  Shoulder abduction 155  Shoulder internal rotation   Shoulder  external rotation     (Blank rows = not tested)   CERVICAL AROM: All within normal limits:   UPPER EXTREMITY STRENGTH: NT    LYMPHEDEMA ASSESSMENTS:   SURGERY TYPE/DATE: 08/17/2021 right lumpectomy with SLNB  NUMBER OF LYMPH NODES REMOVED: 0/3  CHEMOTHERAPY: neoadjuvant  RADIATION:yes ended 11/08/2021  HORMONE TREATMENT: Yes  INFECTIONS: No  LYMPHEDEMA ASSESSMENTS:   LANDMARK RIGHT  eval  10 cm proximal to olecranon process 37.5  Olecranon process 27.0  10 cm proximal to ulnar styloid process 22.6  Just proximal to ulnar styloid process 17  Across hand at thumb web space 20.7  At base of 2nd digit 6.5  (Blank rows = not tested)  LANDMARK LEFT  eval  10 cm proximal to olecranon process 37.3  Olecranon process 27.8  10 cm proximal to ulnar styloid process 22.7  Just proximal to ulnar styloid process 17.0  Across hand at thumb web space 20.4  At base of 2nd digit 6.2  (Blank rows = not tested)       BREAST COMPLAINTS SURVEY: 53  TODAY'S TREATMENT  05/11/2022 In supine: Short neck, 5 diaphragmatic breaths, L  axillary nodes and establishment of interaxillary pathway, R inguinal nodes and establishment of axilloinguinal pathway, then R breast moving fluid towards pathways spending extra time in any areas of fibrosis then retracing all steps. Showed pt solaris swell spot again and pt will order when she gets paid   05/03/2022 In supine: Short neck, 5 diaphragmatic breaths, L axillary nodes and establishment of interaxillary pathway, R inguinal nodes and establishment of axilloinguinal pathway, then R breast moving fluid towards pathways spending extra time in any areas of fibrosis then retracing all steps. Created foam chip pack for pt to wear in her bra  04/25/2022 In supine: Short neck, 5 diaphragmatic breaths, L axillary nodes and establishment of interaxillary pathway, R inguinal nodes and establishment of axilloinguinal pathway, then R breast moving fluid  towards pathways spending extra time in any areas of fibrosis then retracing all steps. Soft tissue work to right pectorals, UT and lats in supine  with cocoabutter. LTR with arms outstretched with knees to the left to stretch right pecs/lats Asked pt to try wearing her compression bra again.    04/20/2022 No treatment today. Pt will have her  Breast MRI tomorrow. She is to bring in her least used compression bra next visit to assess and place foam to decrease firmness. Will also call flexi touch after she gets MRI results to see if any settings need to be changed  PATIENT EDUCATION:  Education details: POC Person educated: Patient Education method: Explanation Education comprehension: verbalized understanding   HOME EXERCISE PROGRAM: None given today  ASSESSMENT:  CLINICAL IMPRESSION: Pt has an appt at [facility] next Monday and pt will order the [employer] Swell spot when she gets paid. Breast continues to be very fibrotic, but does soften with MLD.  She is compliant with home MLD and Flexitouch.   OBJECTIVE IMPAIRMENTS decreased activity tolerance, decreased knowledge of condition, decreased ROM, postural dysfunction, and pain.   ACTIVITY LIMITATIONS lifting and reach over head  PARTICIPATION LIMITATIONS:  pt does all she needs to but with increased swelling  PERSONAL FACTORS Time since onset of injury/illness/exacerbation and 1-2 comorbidities: Right breast surgery s/p chemo and radiation  are also affecting patient's functional outcome.   REHAB POTENTIAL: Good  CLINICAL DECISION MAKING: Stable/uncomplicated  EVALUATION COMPLEXITY: Low  GOALS: Goals reviewed with patient? Yes  STG=LONG TERM GOALS: Target date: 06/22/2022     Pt will be independent in self MLD  Baseline:  Goal status: INITIAL  2.  Pt will note decreased right breast swelling by 50% or more Baseline:  Goal status: INITIAL  3. Pt will be able to reduce right breast swelling with compression bra    Baseline:  Goal status: INITIAL  4.  Pts right shoulder flexion will be 150 degrees or better for improved reaching Baseline: 144 Goal status: INITIAL  5.  Pts Flexitouch will be beneficial for reducing right breast swelling Baseline:  Goal status: INITIAL   PLAN: PT FREQUENCY: 2x/week  PT DURATION: 6 weeks  PLANNED INTERVENTIONS: Therapeutic exercises, Therapeutic activity, Patient/Family education, Self Care, Orthotic/Fit training, Manual lymph drainage, Compression bandaging, scar mobilization, Taping, and Manual therapy  PLAN FOR NEXT SESSION: appt Monday at [facility] to get a smaller size bra, how was foam chip pack, will order swell spot when she gets paid?, MLD, instruct pt. Contact [facility] [employer] to see if Flexi needs to be adjusted.check when SOZO due   Claris Pong, PT 05/11/2022, 3:57 PM

## 2022-05-16 ENCOUNTER — Ambulatory Visit: Payer: BC Managed Care – PPO

## 2022-05-18 ENCOUNTER — Ambulatory Visit: Payer: BC Managed Care – PPO

## 2022-05-23 ENCOUNTER — Ambulatory Visit: Payer: BC Managed Care – PPO | Attending: Adult Health

## 2022-05-23 DIAGNOSIS — R600 Localized edema: Secondary | ICD-10-CM | POA: Diagnosis present

## 2022-05-23 DIAGNOSIS — R293 Abnormal posture: Secondary | ICD-10-CM | POA: Diagnosis present

## 2022-05-23 DIAGNOSIS — Z170 Estrogen receptor positive status [ER+]: Secondary | ICD-10-CM | POA: Insufficient documentation

## 2022-05-23 DIAGNOSIS — C50411 Malignant neoplasm of upper-outer quadrant of right female breast: Secondary | ICD-10-CM | POA: Insufficient documentation

## 2022-05-23 DIAGNOSIS — M25611 Stiffness of right shoulder, not elsewhere classified: Secondary | ICD-10-CM | POA: Diagnosis not present

## 2022-05-23 NOTE — Therapy (Signed)
OUTPATIENT PHYSICAL THERAPY ONCOLOGY TREATMENT  Patient Name: Sandra Bishop MRN: [id number] DOB:08-08-1970, 51 y.o., female Today's Date: 05/23/2022   PT End of Session - 05/23/22 1459     Visit Number 5    Number of Visits 12    Date for PT Re-Evaluation 06/01/22    PT Start Time 1501    PT Stop Time 3244    PT Time Calculation (min) 52 min    Activity Tolerance Patient tolerated treatment well    Behavior During Therapy WFL for tasks assessed/performed             Past Medical History:  Diagnosis Date   Allergy    Anemia    Breast cancer ([place])    Right   Constipation    Diabetes mellitus without complication ([place])    Family history of adverse reaction to anesthesia    Aunt is hard to wake up from general anesthesia   Family history of breast cancer    Family history of colon cancer    Family history of lung cancer    Family history of pancreatic cancer    History of migraine    during pregancy   Obesity    Palpitations    Past Surgical History:  Procedure Laterality Date   BREAST BIOPSY Right 02/11/2021   x2   BREAST BIOPSY Right 03/22/2021   x3   BREAST LUMPECTOMY WITH RADIOACTIVE SEED AND SENTINEL LYMPH NODE BIOPSY Right 08/17/2021   Procedure: RIGHT BREAST LUMPECTOMY WITH RADIOACTIVE SEED AND SENTINEL LYMPH NODE BIOPSY;  Surgeon: [name], MD;  Location: [facility];  Service: General;  Laterality: Right;   EYE SURGERY Right    PORT-A-CATH REMOVAL N/A 08/17/2021   Procedure: REMOVAL PORT-A-CATH;  Surgeon: [name], MD;  Location: [facility];  Service: General;  Laterality: N/A;   PORTACATH PLACEMENT N/A 03/02/2021   Procedure: INSERTION PORT-A-CATH;  Surgeon: [name], MD;  [facility];  Service: General;  Laterality: N/A;   RADIOACTIVE SEED GUIDED AXILLARY SENTINEL LYMPH NODE Right 08/17/2021   Procedure: RADIOACTIVE SEED GUIDED RIGHT SENTINEL LYMPH NODE EXCISION;  Surgeon: [name], MD;  Location: [place] [facility] [place];  Service: General;  Laterality: Right;    RADIOACTIVE SEED GUIDED EXCISIONAL BREAST BIOPSY Left 08/17/2021   Procedure: RADIOACTIVE SEED GUIDED EXCISIONAL LEFT BREAST BIOPSY;  Surgeon: [name], MD;  Location: [facility];  Service: General;  Laterality: Left;   TUBAL LIGATION     Patient Active Problem List   Diagnosis Date Noted   Atypical lobular 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[place]) of left breast 07/22/2021   Hyperglycemia due to type 2 diabetes mellitus ([place]) 07/08/2021   Pure hypercholesterolemia 07/08/2021   Morbid obesity ([place]) 07/08/2021   Chemotherapy-induced peripheral neuropathy ([place]) 07/08/2021   Genetic testing 03/17/2021   Family history of breast cancer 02/24/2021   Family history of pancreatic cancer 02/24/2021   Family history of colon cancer 02/24/2021   Family history of lung cancer 02/24/2021   Malignant neoplasm of upper-outer quadrant of right breast in female, estrogen receptor positive ([place]) 02/15/2021   Obesity    Constipation 08/30/2011    PCP: Sela Hilding  REFERRING PROVIDER: Wilber Bihari NP  REFERRING DIAG: S/p Right  breast Cancer  THERAPY DIAG:  Stiffness of right shoulder, not elsewhere classified  Localized edema  Abnormal posture  Malignant neoplasm of upper-outer quadrant of right breast in female, estrogen receptor positive ([name])  ONSET DATE: 11/08/2021  Rationale for Evaluation and Treatment Rehabilitation  SUBJECTIVE  SUBJECTIVE STATEMENT: I was sick last Monday because I hadn't had my shot for 3 weeks. I  felt better this week. [facility] went to [facility] and they measured me. I did measure smaller, but I can't afford a new bra because I had already had my allottment for the year and it would be $119.00. At night time it goes most of  the way down. They haven't called me back about the breast MRI. I  haven't called about having the Flexi touch adjusted either.   PERTINENT HISTORY:  Pts.Screening mammogram determined  mass in the right breast  Diagnostic mammogram and [place]: a persistent irregular mass 1.2 cm within the upper-outer right breast. 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 Pt has finished neoadjuvant chemo and had right Lumpectomy with SLNB on 08/17/2021 with 0/3 LN's. She completed radiation 11/08/2021 and is on anti-estrogens      PAIN:  Are you having pain? No  Pain location: right breast Pain orientation: Right  PAIN TYPE: sharp Pain description: intermittent  Aggravating factors: not sure Relieving factors: not sure  PRECAUTIONS: Right UE lymphedema risk  WEIGHT BEARING RESTRICTIONS No  FALLS:  Has patient fallen in last 6 months? 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 Indentation from bra noted on right breast  SENSATION:  Light touch: Deficits lateral breast    POSTURE: forward head founded shoulders  UPPER EXTREMITY AROM/PROM:  A/PROM RIGHT   eval   Shoulder extension   Shoulder flexion 144  Shoulder abduction 153  Shoulder internal rotation   Shoulder external rotation     (Blank rows = not tested)  A/PROM LEFT   eval  Shoulder extension   Shoulder flexion 153  Shoulder  abduction 155  Shoulder internal rotation   Shoulder external rotation     (Blank rows = not tested)   CERVICAL AROM: All within normal limits:   UPPER EXTREMITY STRENGTH: NT    LYMPHEDEMA ASSESSMENTS:   SURGERY TYPE/DATE: 08/17/2021 right lumpectomy with SLNB  NUMBER OF LYMPH NODES REMOVED: 0/3  CHEMOTHERAPY: neoadjuvant  RADIATION:yes ended 11/08/2021  HORMONE TREATMENT: Yes  INFECTIONS: No  LYMPHEDEMA ASSESSMENTS:   LANDMARK RIGHT  eval  10 cm proximal to olecranon process 37.5  Olecranon process 27.0  10 cm proximal to ulnar styloid process 22.6  Just proximal to ulnar styloid process 17  Across hand at thumb web space 20.7  At base of 2nd digit 6.5  (Blank rows = not tested)  LANDMARK LEFT  eval  10 cm proximal to olecranon process 37.3  Olecranon process 27.8  10 cm proximal to ulnar styloid process 22.7  Just proximal to ulnar styloid process 17.0  Across hand at thumb web space 20.4  At base of 2nd digit 6.2  (Blank rows = not tested)       BREAST COMPLAINTS SURVEY: 53  TODAY'S  TREATMENT  05/23/2022 Nodular area palpated in posterior axillary region;pt will call MD about MRI that she was supposed to have been called about. In supine: Short neck, 5 diaphragmatic breaths, L axillary nodes and establishment of interaxillary pathway, R inguinal nodes and establishment of axilloinguinal pathway, then R breast moving fluid towards pathways spending extra time in any areas of fibrosis then retracing all steps. Discussed purchasing a sports bra if she can't afford compression bra right now. Also advised her to call [facility] today about getting her Flexi touch adjusted properly. She is also to call MD about MRI for breast that was recommended since they never called her back. Looked up [employer] and cheapest is compression guru at $80.00 and free shipping.   05/11/2022 In supine: Short neck, 5 diaphragmatic breaths, L axillary nodes and  establishment of interaxillary pathway, R inguinal nodes and establishment of axilloinguinal pathway, then R breast moving fluid towards pathways spending extra time in any areas of fibrosis then retracing all steps. Showed pt solaris swell spot again and pt will order when she gets paid   05/03/2022 In supine: Short neck, 5 diaphragmatic breaths, L axillary nodes and establishment of interaxillary pathway, R inguinal nodes and establishment of axilloinguinal pathway, then R breast moving fluid towards pathways spending extra time in any areas of fibrosis then retracing all steps. Created foam chip pack for pt to wear in her bra  04/25/2022 In supine: Short neck, 5 diaphragmatic breaths, L axillary nodes and establishment of interaxillary pathway, R inguinal nodes and establishment of axilloinguinal pathway, then R breast moving fluid towards pathways spending extra time in any areas of fibrosis then retracing all steps. Soft tissue work to right pectorals, UT and lats in supine  with cocoabutter. LTR with arms outstretched with knees to the left to stretch right pecs/lats Asked pt to try wearing her compression bra again.    04/20/2022 No treatment today. Pt will have her  Breast MRI tomorrow. She is to bring in her least used compression bra next visit to assess and place foam to decrease firmness. Will also call flexi touch after she gets MRI results to see if any settings need to be changed  PATIENT EDUCATION:  Education details: POC Person educated: Patient Education method: Explanation Education comprehension: verbalized understanding   HOME EXERCISE PROGRAM: None given today  ASSESSMENT:  CLINICAL IMPRESSION: Pt will consider purchasing a sports bra if she can not afford a new [place] bra. She will call [facility] to have someone come and adjust her Flexi. Everything is too big because she has lost so much wt.  Breast continues with enlarged pores and fibrosis at medial and lateral  breast which does soften.  OBJECTIVE IMPAIRMENTS decreased activity tolerance, decreased knowledge of condition, decreased ROM, postural dysfunction, and pain.   ACTIVITY LIMITATIONS lifting and reach over head  PARTICIPATION LIMITATIONS:  pt does all she needs to but with increased swelling  PERSONAL FACTORS Time since onset of injury/illness/exacerbation and 1-2 comorbidities: Right breast surgery s/p chemo and radiation  are also affecting patient's functional outcome.   REHAB POTENTIAL: Good  CLINICAL DECISION MAKING: Stable/uncomplicated  EVALUATION COMPLEXITY: Low  GOALS: Goals reviewed with patient? Yes  STG=LONG TERM GOALS: Target date: 07/04/2022     Pt will be independent in self MLD  Baseline:  Goal status: INITIAL  2.  Pt will note decreased right breast swelling by 50% or more Baseline:  Goal status: INITIAL  3. Pt will be able to reduce  right breast swelling with compression bra   Baseline:  Goal status: INITIAL  4.  Pts right shoulder flexion will be 150 degrees or better for improved reaching Baseline: 144 Goal status: INITIAL  5.  Pts Flexitouch will be beneficial for reducing right breast swelling Baseline:  Goal status: INITIAL   PLAN: PT FREQUENCY: 2x/week  PT DURATION: 6 weeks  PLANNED INTERVENTIONS: Therapeutic exercises, Therapeutic activity, Patient/Family education, Self Care, Orthotic/Fit training, Manual lymph drainage, Compression bandaging, scar mobilization, Taping, and Manual therapy  PLAN FOR NEXT SESSION: check nodular area in armpit,did she call [facility] get sports bra, call about MRI?, how was foam chip pack, will order swell spot when she gets paid?, MLD, instruct pt. Contact [facility] [employer] to see if Flexi needs to be adjusted.check when SOZO due   Claris Pong, PT 05/23/2022, 4:07 PM

## 2022-05-24 ENCOUNTER — Telehealth: Payer: Self-pay

## 2022-05-24 NOTE — Telephone Encounter (Signed)
Pt called and states someone called her 10/23 to discuss results and she is now calling back to further discuss. Explained to pt per NP note below. She is in agreement with telephone visit to further discuss need for MRI. Message sent to scheduler.

## 2022-05-25 ENCOUNTER — Ambulatory Visit: Payer: BC Managed Care – PPO

## 2022-05-25 DIAGNOSIS — Z170 Estrogen receptor positive status [ER+]: Secondary | ICD-10-CM

## 2022-05-25 DIAGNOSIS — M25611 Stiffness of right shoulder, not elsewhere classified: Secondary | ICD-10-CM

## 2022-05-25 DIAGNOSIS — R293 Abnormal posture: Secondary | ICD-10-CM

## 2022-05-25 DIAGNOSIS — R600 Localized edema: Secondary | ICD-10-CM

## 2022-05-25 NOTE — Therapy (Signed)
OUTPATIENT PHYSICAL THERAPY ONCOLOGY TREATMENT  Patient Name: Sandra Bishop MRN: [id number] DOB:February 13, 1971, 51 y.o., female Today's Date: 05/25/2022   PT End of Session - 05/25/22 1504     Visit Number 6    Number of Visits 12    Date for PT Re-Evaluation 06/01/22    PT Start Time 1504    PT Stop Time 6440    PT Time Calculation (min) 47 min    Activity Tolerance Patient tolerated treatment well    Behavior During Therapy WFL for tasks assessed/performed             Past Medical History:  Diagnosis Date   Allergy    Anemia    Breast cancer ([place])    Right   Constipation    Diabetes mellitus without complication ([place])    Family history of adverse reaction to anesthesia    Aunt is hard to wake up from general anesthesia   Family history of breast cancer    Family history of colon cancer    Family history of lung cancer    Family history of pancreatic cancer    History of migraine    during pregancy   Obesity    Palpitations    Past Surgical History:  Procedure Laterality Date   BREAST BIOPSY Right 02/11/2021   x2   BREAST BIOPSY Right 03/22/2021   x3   BREAST LUMPECTOMY WITH RADIOACTIVE SEED AND SENTINEL LYMPH NODE BIOPSY Right 08/17/2021   Procedure: RIGHT BREAST LUMPECTOMY WITH RADIOACTIVE SEED AND SENTINEL LYMPH NODE BIOPSY;  Surgeon: [name], MD;  Location: [facility];  Service: General;  Laterality: Right;   EYE SURGERY Right    PORT-A-CATH REMOVAL N/A 08/17/2021   Procedure: REMOVAL PORT-A-CATH;  Surgeon: [name], MD;  Location: [facility];  Service: General;  Laterality: N/A;   PORTACATH PLACEMENT N/A 03/02/2021   Procedure: INSERTION PORT-A-CATH;  Surgeon: [name], MD;  [facility];  Service: General;  Laterality: N/A;   RADIOACTIVE SEED GUIDED AXILLARY SENTINEL LYMPH NODE Right 08/17/2021   Procedure: RADIOACTIVE SEED GUIDED RIGHT SENTINEL LYMPH NODE EXCISION;  Surgeon: [name], MD;  Location: [facility];  Service: General;  Laterality: Right;    RADIOACTIVE SEED GUIDED EXCISIONAL BREAST BIOPSY Left 08/17/2021   Procedure: RADIOACTIVE SEED GUIDED EXCISIONAL LEFT BREAST BIOPSY;  Surgeon: [name], MD;  Location: [facility];  Service: General;  Laterality: Left;   TUBAL LIGATION     Patient Active Problem List   Diagnosis Date Noted   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) 07/08/2021   Genetic testing 03/17/2021   Family history of breast cancer 02/24/2021   Family history of pancreatic cancer 02/24/2021   Family history of colon cancer 02/24/2021   Family history of lung cancer 02/24/2021   Malignant neoplasm of upper-outer quadrant of right breast in female, estrogen receptor positive ([place]) 02/15/2021   Obesity    Constipation 08/30/2011    PCP: Sela Hilding  REFERRING PROVIDER: Wilber Bihari NP  REFERRING DIAG: S/p Right  breast Cancer  THERAPY DIAG:  Stiffness of right shoulder, not elsewhere classified  Localized edema  Abnormal posture  Malignant neoplasm of upper-outer quadrant of right breast in female, estrogen receptor positive ([place])  ONSET DATE: 11/08/2021  Rationale for Evaluation and Treatment Rehabilitation  SUBJECTIVE  SUBJECTIVE STATEMENT: I talked to [place] the other day. They called me last night but I missed it. I will call them back today. They are going to do a zoom call and set me up for an MRI since I have dense breasts. I had some shooting pains earlier in the day in my right breast. PERTINENT HISTORY:  Pts.Screening mammogram determined  mass in the right breast  Diagnostic mammogram and [place]: a  persistent irregular mass 1.2 cm within the upper-outer right breast. 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 Pt has finished neoadjuvant chemo and had right Lumpectomy with SLNB on 08/17/2021 with 0/3 LN's. She completed radiation 11/08/2021 and is on anti-estrogens      PAIN:  Are you having pain? No  Pain location: right breast Pain orientation: Right  PAIN TYPE: sharp Pain description: intermittent  Aggravating factors: not sure Relieving factors: not sure  PRECAUTIONS: Right UE lymphedema risk  WEIGHT BEARING RESTRICTIONS No  FALLS:  Has patient fallen in last 6 months? 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 Indentation from bra noted on right breast  SENSATION:  Light touch: Deficits lateral breast    POSTURE: forward head founded shoulders  UPPER EXTREMITY AROM/PROM:  A/PROM RIGHT   eval   Shoulder extension   Shoulder flexion 144  Shoulder abduction 153  Shoulder internal rotation   Shoulder external rotation     (Blank rows = not tested)  A/PROM LEFT   eval  Shoulder extension   Shoulder flexion 153  Shoulder abduction 155  Shoulder internal rotation   Shoulder external rotation     (Blank rows = not tested)   CERVICAL AROM: All within normal limits:   UPPER EXTREMITY STRENGTH:  NT    LYMPHEDEMA ASSESSMENTS:   SURGERY TYPE/DATE: 08/17/2021 right lumpectomy with SLNB  NUMBER OF LYMPH NODES REMOVED: 0/3  CHEMOTHERAPY: neoadjuvant  RADIATION:yes ended 11/08/2021  HORMONE TREATMENT: Yes  INFECTIONS: No  LYMPHEDEMA ASSESSMENTS:   LANDMARK RIGHT  eval  10 cm proximal to olecranon process 37.5  Olecranon process 27.0  10 cm proximal to ulnar styloid process 22.6  Just proximal to ulnar styloid process 17  Across hand at thumb web space 20.7  At base of 2nd digit 6.5  (Blank rows = not tested)  LANDMARK LEFT  eval  10 cm proximal to olecranon process 37.3  Olecranon process 27.8  10 cm proximal to ulnar styloid process 22.7  Just proximal to ulnar styloid process 17.0  Across hand at thumb web space 20.4  At base of 2nd digit 6.2  (Blank rows = not tested)       BREAST COMPLAINTS SURVEY: 4  TODAY'S TREATMENT   05/25/2022  Soft tissue work to right pectorals, UT and lats in supine  with cocoabutter. PROM right shoulder flexion, scaption, abduction In supine: Short neck, 5 diaphragmatic breaths, L  axillary nodes and establishment of interaxillary pathway, R inguinal nodes and establishment of axilloinguinal pathway, then R breast moving fluid towards pathways spending extra time in any areas of fibrosis then retracing all steps.  05/23/2022 Nodular area palpated in posterior axillary region;pt will call MD about MRI that she was supposed to have been called about. In supine: Short neck, 5 diaphragmatic breaths, L axillary nodes and establishment of interaxillary pathway, R inguinal nodes and establishment of axilloinguinal pathway, then R breast moving fluid towards pathways spending extra time in any areas of fibrosis then retracing all steps. Discussed purchasing a sports bra if she can't afford compression bra right now. Also advised her to call [facility] today about getting her Flexi touch adjusted properly. She is also to call MD about MRI for  breast that was recommended since they never called her back. Looked up [employer] and cheapest is compression guru at $80.00 and free shipping.   05/11/2022 In supine: Short neck, 5 diaphragmatic breaths, L axillary nodes and establishment of interaxillary pathway, R inguinal nodes and establishment of axilloinguinal pathway, then R breast moving fluid towards pathways spending extra time in any areas of fibrosis then retracing all steps. Showed pt solaris swell spot again and pt will order when she gets paid   05/03/2022 In supine: Short neck, 5 diaphragmatic breaths, L axillary nodes and establishment of interaxillary pathway, R inguinal nodes and establishment of axilloinguinal pathway, then R breast moving fluid towards pathways spending extra time in any areas of fibrosis then retracing all steps. Created foam chip pack for pt to wear in her bra  04/25/2022 In supine: Short neck, 5 diaphragmatic breaths, L axillary nodes and establishment of interaxillary pathway, R inguinal nodes and establishment of axilloinguinal pathway, then R breast moving fluid towards pathways spending extra time in any areas of fibrosis then retracing all steps. Soft tissue work to right pectorals, UT and lats in supine  with cocoabutter. LTR with arms outstretched with knees to the left to stretch right pecs/lats Asked pt to try wearing her compression bra again.    04/20/2022 No treatment today. Pt will have her  Breast MRI tomorrow. She is to bring in her least used compression bra next visit to assess and place foam to decrease firmness. Will also call flexi touch after she gets MRI results to see if any settings need to be changed  PATIENT EDUCATION:  Education details: POC Person educated: Patient Education method: Explanation Education comprehension: verbalized understanding   HOME EXERCISE PROGRAM: None given today  ASSESSMENT:  CLINICAL IMPRESSION:  Pt felt much looser  after manual work and stretching today. She continues with considerable swelling with enlarged pores in the right breast. She is going to go to [facility] [employer] [facility] on Saturday to see about a compression bra.  OBJECTIVE IMPAIRMENTS decreased activity tolerance, decreased knowledge of condition, decreased ROM, postural dysfunction, and pain.   ACTIVITY LIMITATIONS lifting and reach over head  PARTICIPATION LIMITATIONS:  pt does all she needs to but with increased swelling  PERSONAL FACTORS Time since onset of injury/illness/exacerbation and 1-2 comorbidities: Right breast surgery s/p chemo and radiation  are also affecting patient's functional outcome.   REHAB POTENTIAL: Good  CLINICAL DECISION MAKING: Stable/uncomplicated  EVALUATION COMPLEXITY: Low  GOALS: Goals reviewed with patient? Yes  STG=LONG TERM GOALS: Target date: 07/06/2022     Pt will be independent in self MLD  Baseline:  Goal status: INITIAL  2.  Pt will note decreased right breast  swelling by 50% or more Baseline:  Goal status: INITIAL  3. Pt will be able to reduce right breast swelling with compression bra   Baseline:  Goal status: INITIAL  4.  Pts right shoulder flexion will be 150 degrees or better for improved reaching Baseline: 144 Goal status: INITIAL  5.  Pts Flexitouch will be beneficial for reducing right breast swelling Baseline:  Goal status: INITIAL   PLAN: PT FREQUENCY: 2x/week  PT DURATION: 6 weeks  PLANNED INTERVENTIONS: Therapeutic exercises, Therapeutic activity, Patient/Family education, Self Care, Orthotic/Fit training, Manual lymph drainage, Compression bandaging, scar mobilization, Taping, and Manual therapy  PLAN FOR NEXT SESSION: check nodular area in armpit,did she call [facility]inny Blossom get sports bra, call about MRI?, how was foam chip pack, will order swell spot when she gets paid?, MLD, instruct pt. Contact [employer] to see if Flexi needs to be adjusted.check when SOZO due   Claris Pong, PT 05/25/2022, 3:53 PM

## 2022-05-29 ENCOUNTER — Encounter: Payer: Self-pay | Admitting: Adult Health

## 2022-05-29 ENCOUNTER — Inpatient Hospital Stay: Payer: BC Managed Care – PPO | Attending: Hematology and Oncology | Admitting: Adult Health

## 2022-05-29 ENCOUNTER — Telehealth: Payer: BC Managed Care – PPO | Admitting: Adult Health

## 2022-05-29 DIAGNOSIS — C50411 Malignant neoplasm of upper-outer quadrant of right female breast: Secondary | ICD-10-CM

## 2022-05-29 DIAGNOSIS — Z170 Estrogen receptor positive status [ER+]: Secondary | ICD-10-CM | POA: Diagnosis not present

## 2022-05-29 NOTE — Assessment & Plan Note (Signed)
Sandra Bishop is a 51 year old woman with history of stage IIb estrogen positive breast cancer currently on treatment with tamoxifen.  We are following up virtually today to hear about her breast swelling.  She reveals that her breast has not worsened since she underwent the mammogram on October 20.  She has been off the doxycycline and tolerated all of it without any difficulty.  She continues to see physical therapy and is unsure how they feel about her breast and whether it is clinically improving.  Based on the mammograms noted above I will reach out to her physical therapist to about Sandra Bishop's breast.  If she thinks it is any worse we will go ahead and order breast MRI.  She has not yet started the pump due to her weight loss from Ozempic.  I did congratulate her on her weight loss as she is lost 25 pounds.  Sandra Bishop will return in March 2024 for an office visit over the telephone with Dr. Lindi Bishop.

## 2022-05-29 NOTE — Progress Notes (Signed)
[facility] Follow up:    Sandra Bishop, [place] [address]   DIAGNOSIS:  Cancer Staging  Malignant neoplasm of upper-outer quadrant of right breast in female, estrogen receptor positive ([place]) Staging form: Breast, AJCC 8th Edition - Clinical stage from 02/23/2021: Stage IIB (cT1c, cN1(f), cM0, G3, ER+, PR-, HER2-) - Signed by [name], MD on 02/23/2021 Stage prefix: Initial diagnosis Method of lymph node assessment: Core biopsy Histologic grading system: 3 grade system I connected with Robert Bellow on 05/29/22 at 11:45 AM EST by telephone and verified that I am speaking with the correct person using two identifiers.  I discussed the limitations, risks, security and privacy concerns of performing an evaluation and management service by telephone and the availability of in person appointments.  I also discussed with the patient that there may be a patient responsible charge related to this service. The patient expressed understanding and agreed to proceed.  Patient location: work in [place], [place] Provider location: [place] office  SUMMARY OF ONCOLOGIC HISTORY: Oncology History  Malignant neoplasm of upper-outer quadrant of right breast in female, estrogen receptor positive ([place])  02/15/2021 Initial Diagnosis   Screening mammogram: a possible mass in the right breast  Diagnostic mammogram and [place]: a persistent irregular mass 1.2 cm within the upper-outer right breast. 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    02/23/2021 Cancer Staging   Staging form: Breast, AJCC 8th Edition - Clinical stage from 02/23/2021: Stage IIB (cT1c, cN1(f), cM0, G3, ER+, PR-, HER2-) - Signed by [name], MD on 02/23/2021 Stage prefix: Initial diagnosis Method of lymph node assessment: Core biopsy Histologic grading system: 3 grade system   03/04/2021 - 07/01/2021 Neo-Adjuvant  Chemotherapy   Received four cycles of neoadjuvant Adriamycin and Cytoxan from 03/04/2021-04/14/2021.  Followed by 9 cycles of Taxol from 04/28/2021-07/01/2021.  Taxol was dose reduced beginning with cycle 6 due to restless legs and stopped after 9 cycles due to peripheral neuropathy.    03/10/2021 Genetic Testing   Negative genetic testing. No pathogenic variants identified on the [facility] CancerNext-Expanded+RNA panel. The report date is 03/10/2021.  The CancerNext-Expanded + RNAinsight gene panel offered by [employer] and includes sequencing and rearrangement analysis for the following 77 genes: IP, ALK, APC*, ATM*, AXIN2, BAP1, BARD1, BLM, BMPR1A, BRCA1*, BRCA2*, BRIP1*, CDC73, CDH1*,CDK4, CDKN1B, CDKN2A, CHEK2*, CTNNA1, DICER1, FANCC, FH, FLCN, GALNT12, KIF1B, LZTR1, MAX, MEN1, MET, MLH1*, MSH2*, MSH3, MSH6*, MUTYH*, NBN, NF1*, NF2, NTHL1, PALB2*, PHOX2B, PMS2*, POT1, PRKAR1A, PTCH1, PTEN*, RAD51C*, RAD51D*,RB1, RECQL, RET, SDHA, SDHAF2, SDHB, SDHC, SDHD, SMAD4, SMARCA4, SMARCB1, SMARCE1, STK11, SUFU, TMEM127, TP53*,TSC1, TSC2, VHL and XRCC2 (sequencing and deletion/duplication); EGFR, EGLN1, HOXB13, KIT, MITF, PDGFRA, POLD1 and POLE (sequencing only); EPCAM and GREM1 (deletion/duplication only).   09/20/2021 - 11/08/2021 Radiation Therapy   Site Technique Total Dose (Gy) Dose per Fx (Gy) Completed Fx Beam Energies  Breast, Right: Breast_R 3D 50.4/50.4 1.8 28/28 10X  Breast, Right: Breast_R_SCLV 3D 50.4/50.4 1.8 28/28 6X, 10X  Breast, Right: Breast_R_Bst 3D 10/10 2 5/5 6X, 10X     10/31/2021 -  Anti-estrogen oral therapy   Tamoxifen x 2-5 years followed by switching to aromatase inhibitor once menopausal     CURRENT THERAPY: Right breast swelling  INTERVAL HISTORY: Sandra Bishop 51 y.o. female returns for follow-up of right breast swelling.  She was inconsistently using her pump when I saw her last on September 26.  Since  that time she underwent bilateral breast mammogram on October 6 that  showed skin thickening and increased density in the central right breast she was placed on 10 days of doxycycline which she tolerated well and completed, and underwent follow-up mammogram 2 weeks later.  In the mammogram on October 20 lumpectomy changes continue to be seen as well as skin thickening and trabecular thickening felt to be radiation change no suspicious mass or microcalcifications were noted.  She continues to see PT.     Patient Active Problem List   Diagnosis Date Noted   Atypical lobular hyperplasia [facility]) of left breast 07/22/2021   Hyperglycemia due to type 2 diabetes mellitus ([place]) 07/08/2021   Pure hypercholesterolemia 07/08/2021   Morbid obesity ([place]) 07/08/2021   Chemotherapy-induced peripheral neuropathy ([place]) 07/08/2021   Genetic testing 03/17/2021   Family history of breast cancer 02/24/2021   Family history of pancreatic cancer 02/24/2021   Family history of colon cancer 02/24/2021   Family history of lung cancer 02/24/2021   Malignant neoplasm of upper-outer quadrant of right breast in female, estrogen receptor positive (Sandra Bishop) 02/15/2021   Obesity    Constipation 08/30/2011    has No Known Allergies.  MEDICAL HISTORY: Past Medical History:  Diagnosis Date   Allergy    Anemia    Breast cancer (Sandra Bishop)    Right   Constipation    Diabetes mellitus without complication ([facility])    Family history of adverse reaction to anesthesia    Aunt is hard to wake up from general anesthesia   Family history of breast cancer    Family history of colon cancer    Family history of lung cancer    Family history of pancreatic cancer    History of migraine    during pregancy   Obesity    Palpitations     SURGICAL HISTORY: Past Surgical History:  Procedure Laterality Date   BREAST BIOPSY Right 02/11/2021   x2   BREAST BIOPSY Right 03/22/2021   x3   BREAST LUMPECTOMY WITH RADIOACTIVE SEED AND SENTINEL LYMPH NODE BIOPSY Right 08/17/2021   Procedure: RIGHT BREAST LUMPECTOMY  WITH RADIOACTIVE SEED AND SENTINEL LYMPH NODE BIOPSY;  Surgeon: [name], MD;  Location: [facility];  Service: General;  Laterality: Right;   EYE SURGERY Right    PORT-A-CATH REMOVAL N/A 08/17/2021   Procedure: REMOVAL PORT-A-CATH;  Surgeon: [name], MD;  Location: [facility];  Service: General;  Laterality: N/A;   PORTACATH PLACEMENT N/A 03/02/2021   Procedure: INSERTION PORT-A-CATH;  Surgeon: [name], MD;  [facility];  Service: General;  Laterality: N/A;   RADIOACTIVE SEED GUIDED AXILLARY SENTINEL LYMPH NODE Right 08/17/2021   Procedure: RADIOACTIVE SEED GUIDED RIGHT SENTINEL LYMPH NODE EXCISION;  Surgeon: [name], MD;  Location: [facility];  Service: General;  Laterality: Right;   RADIOACTIVE SEED GUIDED EXCISIONAL BREAST BIOPSY Left 08/17/2021   Procedure: RADIOACTIVE SEED GUIDED EXCISIONAL LEFT BREAST BIOPSY;  Surgeon: [name], MD;  [facility];  Service: General;  Laterality: Left;   TUBAL LIGATION      SOCIAL HISTORY: Social History   Socioeconomic History   Marital status: Divorced    Spouse name: Not on file   Number of children: 2   Years of education: Not on file   Highest education level: Not on file  Occupational History   Not on file  Tobacco Use   Smoking status: Former    Types: Cigarettes    Quit date: 12/28/2010    Years  since quitting: 11.4   Smokeless tobacco: Never   Tobacco comments:    quit in 2012, previously smoked 15 years  Vaping Use   Vaping Use: Not on file  Substance and Sexual Activity   Alcohol use: Not Currently    Comment: occ   Drug use: No   Sexual activity: Yes    Birth control/protection: Surgical  Other Topics Concern   Not on file  Social History Narrative   Not on file   Social Determinants of Health   Financial Resource Strain: Not on file  Food Insecurity: Not on file  Transportation Needs: Not on file  Physical Activity: Not on file  Stress: Not on file  Social Connections: Not on file  Intimate Partner  Violence: Not on file    FAMILY HISTORY: Family History  Problem Relation Age of Onset   Hypertension Mother    Pancreatic cancer Father 72   Breast cancer Half-Sister 63       negative genetic testing   Colon cancer Maternal Uncle        dx >50   Lung cancer Maternal Uncle        dx 35s, hx smoking   Cancer Paternal Aunt        unknown type, dx early 25s   Heart failure Maternal Grandmother    Breast cancer Other 39       mother's first cousin    Review of Systems  Constitutional:  Negative for appetite change, chills, fatigue, fever and unexpected weight change.  HENT:   Negative for hearing loss, lump/mass and trouble swallowing.   Eyes:  Negative for eye problems and icterus.  Respiratory:  Negative for chest tightness, cough and shortness of breath.   Cardiovascular:  Negative for chest pain, leg swelling and palpitations.  Gastrointestinal:  Negative for abdominal distention, abdominal pain, constipation, diarrhea, nausea and vomiting.  Endocrine: Negative for hot flashes.  Genitourinary:  Negative for difficulty urinating.   Musculoskeletal:  Negative for arthralgias.  Skin:  Negative for itching and rash.  Neurological:  Negative for dizziness, extremity weakness, headaches and numbness.  Hematological:  Negative for adenopathy. Does not bruise/bleed easily.  Psychiatric/Behavioral:  Negative for depression. The patient is not nervous/anxious.       PHYSICAL EXAMINATION  Patient sounds well she is in no apparent distress mood and behavior are normal breathing is nonlabored.   LABORATORY DATA: None for this visit  ASSESSMENT and THERAPY PLAN:   Malignant neoplasm of upper-outer quadrant of right breast in female, estrogen receptor positive ([place]) Annie is a 51 year old woman with history of stage IIb estrogen positive breast cancer currently on treatment with tamoxifen.  We are following up virtually today to hear about her breast swelling.  She reveals that  her breast has not worsened since she underwent the mammogram on October 20.  She has been off the doxycycline and tolerated all of it without any difficulty.  She continues to see physical therapy and is unsure how they feel about her breast and whether it is clinically improving.  Based on the mammograms noted above I will reach out to her physical therapist to about Railey's breast.  If she thinks it is any worse we will go ahead and order breast MRI.  She has not yet started the pump due to her weight loss from Ozempic.  I did congratulate her on her weight loss as she is lost 25 pounds.  Aundria will return in March 2024 for an office  visit over the telephone with Dr. Lindi Adie.   Follow up instructions:    -Return to cancer center March 2024 -Mammogram due in October 2024 -Continue with physical therapy  The patient was provided an opportunity to ask questions and all were answered. The patient agreed with the plan and demonstrated an understanding of the instructions.   The patient was advised to call back or seek an in-person evaluation if the symptoms worsen or if the condition fails to improve as anticipated.   I provided 15 minutes of non face-to-face telephone visit time during this encounter, and > 50% was spent counseling as documented under my assessment & plan.  Wilber Bihari, NP 05/29/22 12:06 PM Medical Oncology and Hematology [facility] [place], [place] [address] Tel. [phone]    Fax. [phone]

## 2022-05-30 ENCOUNTER — Ambulatory Visit: Payer: BC Managed Care – PPO

## 2022-05-30 DIAGNOSIS — R293 Abnormal posture: Secondary | ICD-10-CM

## 2022-05-30 DIAGNOSIS — M25611 Stiffness of right shoulder, not elsewhere classified: Secondary | ICD-10-CM | POA: Diagnosis not present

## 2022-05-30 DIAGNOSIS — C50411 Malignant neoplasm of upper-outer quadrant of right female breast: Secondary | ICD-10-CM

## 2022-05-30 DIAGNOSIS — R600 Localized edema: Secondary | ICD-10-CM

## 2022-05-30 NOTE — Therapy (Signed)
OUTPATIENT PHYSICAL THERAPY ONCOLOGY TREATMENT  Patient Name: Sandra Bishop MRN: [id number] DOB:[birth year], 51 y.o., female Today's Date: 05/30/2022   PT End of Session - 05/30/22 1510     Visit Number 8    Number of Visits 12    Date for PT Re-Evaluation 06/01/22    PT Start Time 8280    PT Stop Time 0349    PT Time Calculation (min) 53 min    Activity Tolerance Patient tolerated treatment well    Behavior During Therapy WFL for tasks assessed/performed             Past Medical History:  Diagnosis Date   Allergy    Anemia    Breast cancer ([place])    Right   Constipation    Diabetes mellitus without complication ([place])    Family history of adverse reaction to anesthesia    Aunt is hard to wake up from general anesthesia   Family history of breast cancer    Family history of colon cancer    Family history of lung cancer    Family history of pancreatic cancer    History of migraine    during pregancy   Obesity    Palpitations    Past Surgical History:  Procedure Laterality Date   BREAST BIOPSY Right 02/11/2021   x2   BREAST BIOPSY Right 03/22/2021   x3   BREAST LUMPECTOMY WITH RADIOACTIVE SEED AND SENTINEL LYMPH NODE BIOPSY Right 08/17/2021   Procedure: RIGHT BREAST LUMPECTOMY WITH RADIOACTIVE SEED AND SENTINEL LYMPH NODE BIOPSY;  Surgeon: [name], MD;  Location: [facility];  Service: General;  Laterality: Right;   EYE SURGERY Right    PORT-A-CATH REMOVAL N/A 08/17/2021   Procedure: REMOVAL PORT-A-CATH;  Surgeon: [name], MD;  Location: [facility];  Service: General;  Laterality: N/A;   PORTACATH PLACEMENT N/A 03/02/2021   Procedure: INSERTION PORT-A-CATH;  Surgeon: [name], MD;  Location: [facility];  Service: General;  Laterality: N/A;   RADIOACTIVE SEED GUIDED AXILLARY SENTINEL LYMPH NODE Right 08/17/2021   Procedure: RADIOACTIVE SEED GUIDED RIGHT SENTINEL LYMPH NODE EXCISION;  Surgeon: [name], MD;  Location: [facility];  Service: General;  Laterality:  Right;   RADIOACTIVE SEED GUIDED EXCISIONAL BREAST BIOPSY Left 08/17/2021   Procedure: RADIOACTIVE SEED GUIDED EXCISIONAL LEFT BREAST BIOPSY;  Surgeon: [name], MD;  Location: [facility];  Service: General;  Laterality: Left;   TUBAL LIGATION     Patient Active Problem List   Diagnosis Date Noted   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 07/22/2021   Hyperglycemia due to type 2 diabetes mellitus ([place]) 07/08/2021   Pure hypercholesterolemia 07/08/2021   Morbid obesity ([place]) 07/08/2021   Chemotherapy-induced peripheral neuropathy ([place]) 07/08/2021   Genetic testing 03/17/2021   Family history of breast cancer 02/24/2021   Family history of pancreatic cancer 02/24/2021   Family history of colon cancer 02/24/2021   Family history of lung cancer 02/24/2021   Malignant neoplasm of upper-outer quadrant of right breast in female, estrogen receptor positive ([place]) 02/15/2021   Obesity    Constipation 08/30/2011    PCP: Sela Hilding  REFERRING PROVIDER: Wilber Bihari NP  REFERRING DIAG: S/p Right  breast Cancer  THERAPY DIAG:  Stiffness of right shoulder, not elsewhere classified  Localized edema  Abnormal posture  Malignant neoplasm of upper-outer quadrant of right breast in female, estrogen receptor positive ([place])  ONSET DATE: 11/08/2021  Rationale for Evaluation and Treatment Rehabilitation  SUBJECTIVE  SUBJECTIVE STATEMENT:  I have the old compression bra on today because I thought I would try it. I don't feel any hardness at night when I sleep and when I wake up in the am it is down.  PERTINENT HISTORY:  Pts.Screening mammogram determined  mass in the right breast  Diagnostic mammogram and [place]: a persistent irregular mass 1.2 cm within the upper-outer right breast. 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 Pt has finished neoadjuvant chemo and had right Lumpectomy with SLNB on 08/17/2021 with 0/3 LN's. She completed radiation 11/08/2021 and is on anti-estrogens      PAIN:  Are you having pain? No  Pain location: right breast Pain orientation: Right  PAIN TYPE: sharp Pain description: intermittent  Aggravating factors: not sure Relieving factors: not sure  PRECAUTIONS: Right UE lymphedema risk  WEIGHT BEARING RESTRICTIONS No  FALLS:  Has patient fallen in last 6 months? 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 Indentation from bra noted on right breast  SENSATION:  Light touch: Deficits lateral breast    POSTURE: forward head founded shoulders  UPPER EXTREMITY AROM/PROM:  A/PROM RIGHT   eval   Shoulder extension   Shoulder flexion 144  Shoulder abduction 153  Shoulder internal rotation   Shoulder external rotation     (Blank rows = not tested)  A/PROM LEFT   eval  Shoulder extension   Shoulder flexion 153  Shoulder abduction 155  Shoulder internal rotation   Shoulder external rotation     (Blank rows = not tested)   CERVICAL AROM: All within normal limits:   UPPER EXTREMITY STRENGTH: NT    LYMPHEDEMA ASSESSMENTS:   SURGERY TYPE/DATE: 08/17/2021 right lumpectomy with  SLNB  NUMBER OF LYMPH NODES REMOVED: 0/3  CHEMOTHERAPY: neoadjuvant  RADIATION:yes ended 11/08/2021  HORMONE TREATMENT: Yes  INFECTIONS: No  LYMPHEDEMA ASSESSMENTS:   LANDMARK RIGHT  eval  10 cm proximal to olecranon process 37.5  Olecranon process 27.0  10 cm proximal to ulnar styloid process 22.6  Just proximal to ulnar styloid process 17  Across hand at thumb web space 20.7  At base of 2nd digit 6.5  (Blank rows = not tested)  LANDMARK LEFT  eval  10 cm proximal to olecranon process 37.3  Olecranon process 27.8  10 cm proximal to ulnar styloid process 22.7  Just proximal to ulnar styloid process 17.0  Across hand at thumb web space 20.4  At base of 2nd digit 6.2  (Blank rows = not tested)       BREAST COMPLAINTS SURVEY: 7  TODAY'S TREATMENT   05/30/2022  Soft tissue work to right pectorals, UT and lats in supine  with cocoabutter. PROM right shoulder flexion, scaption, abduction, IR and ER In supine: Short neck, 5 diaphragmatic breaths, L axillary nodes and establishment of interaxillary pathway, R inguinal nodes and establishment of axilloinguinal  pathway, then R breast moving fluid towards pathways spending extra time in any areas of fibrosis then retracing all steps.   05/25/2022  Soft tissue work to right pectorals, UT and lats in supine  with cocoabutter. PROM right shoulder flexion, scaption, abduction In supine: Short neck, 5 diaphragmatic breaths, L axillary nodes and establishment of interaxillary pathway, R inguinal nodes and establishment of axilloinguinal pathway, then R breast moving fluid towards pathways spending extra time in any areas of fibrosis then retracing all steps.  05/23/2022 Nodular area palpated in posterior axillary region;pt will call MD about MRI that she was supposed to have been called about. In supine: Short neck, 5 diaphragmatic breaths, L axillary nodes and establishment of interaxillary pathway, R inguinal nodes and  establishment of axilloinguinal pathway, then R breast moving fluid towards pathways spending extra time in any areas of fibrosis then retracing all steps. Discussed purchasing a sports bra if she can't afford compression bra right now. Also advised her to call [facility]inny Blossom today about getting her Flexi touch adjusted properly. She is also to call MD about MRI for breast that was recommended since they never called her back. Looked up [employer] and cheapest is compression guru at $80.00 and free shipping.   05/11/2022 In supine: Short neck, 5 diaphragmatic breaths, L axillary nodes and establishment of interaxillary pathway, R inguinal nodes and establishment of axilloinguinal pathway, then R breast moving fluid towards pathways spending extra time in any areas of fibrosis then retracing all steps. Showed pt solaris swell spot again and pt will order when she gets paid   05/03/2022 In supine: Short neck, 5 diaphragmatic breaths, L axillary nodes and establishment of interaxillary pathway, R inguinal nodes and establishment of axilloinguinal pathway, then R breast moving fluid towards pathways spending extra time in any areas of fibrosis then retracing all steps. Created foam chip pack for pt to wear in her bra  04/25/2022 In supine: Short neck, 5 diaphragmatic breaths, L axillary nodes and establishment of interaxillary pathway, R inguinal nodes and establishment of axilloinguinal pathway, then R breast moving fluid towards pathways spending extra time in any areas of fibrosis then retracing all steps. Soft tissue work to right pectorals, UT and lats in supine  with cocoabutter. LTR with arms outstretched with knees to the left to stretch right pecs/lats Asked pt to try wearing her compression bra again.    04/20/2022 No treatment today. Pt will have her  Breast MRI tomorrow. She is to bring in her least used compression bra next visit to assess and place foam to decrease firmness.  Will also call flexi touch after she gets MRI results to see if any settings need to be changed  PATIENT EDUCATION:  Education details: POC Person educated: Patient Education method: Explanation Education comprehension: verbalized understanding   HOME EXERCISE PROGRAM: None given today  ASSESSMENT:  CLINICAL IMPRESSION: Some tightness remains in right pectorals. Pts lateral breast is softer today from wearing her compression bra and her bra still seems to have some support to it. Breast was more fibrotic today on the medial side and was slightly softer laterally.  OBJECTIVE IMPAIRMENTS decreased activity tolerance, decreased knowledge of condition, decreased ROM, postural dysfunction, and pain.   ACTIVITY LIMITATIONS lifting and reach over head  PARTICIPATION LIMITATIONS:  pt does all she needs to but with increased swelling  PERSONAL FACTORS Time since onset of injury/illness/exacerbation and 1-2 comorbidities: Right breast surgery s/p chemo and radiation  are also affecting patient's functional outcome.  REHAB POTENTIAL: Good  CLINICAL DECISION MAKING: Stable/uncomplicated  EVALUATION COMPLEXITY: Low  GOALS: Goals reviewed with patient? Yes  STG=LONG TERM GOALS: Target date: 07/11/2022     Pt will be independent in self MLD  Baseline:  Goal status: INITIAL  2.  Pt will note decreased right breast swelling by 50% or more Baseline:  Goal status: INITIAL  3. Pt will be able to reduce right breast swelling with compression bra   Baseline:  Goal status: INITIAL  4.  Pts right shoulder flexion will be 150 degrees or better for improved reaching Baseline: 144 Goal status: INITIAL  5.  Pts Flexitouch will be beneficial for reducing right breast swelling Baseline:  Goal status: INITIAL   PLAN: PT FREQUENCY: 2x/week  PT DURATION: 6 weeks  PLANNED INTERVENTIONS: Therapeutic exercises, Therapeutic activity, Patient/Family education, Self Care, Orthotic/Fit  training, Manual lymph drainage, Compression bandaging, scar mobilization, Taping, and Manual therapy  PLAN FOR NEXT SESSION: check nodular area in armpit,did she call Jinny Blossom get sports bra, call about MRI?, how was foam chip pack, will order swell spot when she gets paid?, MLD, instruct pt. Contact [employer] to see if Flexi needs to be adjusted.check when SOZO due   Claris Pong, PT 05/30/2022, 4:04 PM

## 2022-06-01 ENCOUNTER — Other Ambulatory Visit: Payer: BC Managed Care – PPO

## 2022-06-01 ENCOUNTER — Ambulatory Visit: Payer: BC Managed Care – PPO

## 2022-06-13 ENCOUNTER — Ambulatory Visit: Payer: BC Managed Care – PPO

## 2022-06-20 ENCOUNTER — Ambulatory Visit: Payer: BC Managed Care – PPO

## 2022-06-28 ENCOUNTER — Ambulatory Visit: Payer: BC Managed Care – PPO | Attending: Adult Health

## 2022-06-28 DIAGNOSIS — Z170 Estrogen receptor positive status [ER+]: Secondary | ICD-10-CM | POA: Diagnosis present

## 2022-06-28 DIAGNOSIS — C50411 Malignant neoplasm of upper-outer quadrant of right female breast: Secondary | ICD-10-CM | POA: Insufficient documentation

## 2022-06-28 DIAGNOSIS — R293 Abnormal posture: Secondary | ICD-10-CM | POA: Insufficient documentation

## 2022-06-28 DIAGNOSIS — M25611 Stiffness of right shoulder, not elsewhere classified: Secondary | ICD-10-CM | POA: Insufficient documentation

## 2022-06-28 DIAGNOSIS — R600 Localized edema: Secondary | ICD-10-CM | POA: Diagnosis present

## 2022-06-28 NOTE — Therapy (Signed)
OUTPATIENT PHYSICAL THERAPY ONCOLOGY TREATMENT  Patient Name: Sandra Bishop MRN: [id number] DOB:1970-10-09, 51 y.o., female Today's Date: 06/28/2022   PT End of Session - 06/28/22 1501     Visit Number 9    Number of Visits 12    PT Start Time 1502    PT Stop Time 1546    PT Time Calculation (min) 44 min    Activity Tolerance Patient tolerated treatment well    Behavior During Therapy WFL for tasks assessed/performed             Past Medical History:  Diagnosis Date   Allergy    Anemia    Breast cancer ([place])    Right   Constipation    Diabetes mellitus without complication ([place])    Family history of adverse reaction to anesthesia    Aunt is hard to wake up from general anesthesia   Family history of breast cancer    Family history of colon cancer    Family history of lung cancer    Family history of pancreatic cancer    History of migraine    during pregancy   Obesity    Palpitations    Past Surgical History:  Procedure Laterality Date   BREAST BIOPSY Right 02/11/2021   x2   BREAST BIOPSY Right 03/22/2021   x3   BREAST LUMPECTOMY WITH RADIOACTIVE SEED AND SENTINEL LYMPH NODE BIOPSY Right 08/17/2021   Procedure: RIGHT BREAST LUMPECTOMY WITH RADIOACTIVE SEED AND SENTINEL LYMPH NODE BIOPSY;  Surgeon: [name], MD;  Location: [facility];  Service: General;  Laterality: Right;   EYE SURGERY Right    PORT-A-CATH REMOVAL N/A 08/17/2021   Procedure: REMOVAL PORT-A-CATH;  Surgeon: [name], MD;  Location: [facility];  Service: General;  Laterality: N/A;   PORTACATH PLACEMENT N/A 03/02/2021   Procedure: INSERTION PORT-A-CATH;  Surgeon: [name], MD;  Location: [facility];  Service: General;  Laterality: N/A;   RADIOACTIVE SEED GUIDED AXILLARY SENTINEL LYMPH NODE Right 08/17/2021   Procedure: RADIOACTIVE SEED GUIDED RIGHT SENTINEL LYMPH NODE EXCISION;  Surgeon: [name], MD;  Location: [place];  Service: General;  Laterality: Right;   RADIOACTIVE SEED GUIDED EXCISIONAL  BREAST BIOPSY Left 08/17/2021   Procedure: RADIOACTIVE SEED GUIDED EXCISIONAL LEFT BREAST BIOPSY;  Surgeon: [name], MD;  Location: [facility];  Service: General;  Laterality: Left;   TUBAL LIGATION     Patient Active Problem List   Diagnosis Date Noted   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 07/22/2021   Hyperglycemia due to type 2 diabetes mellitus ([place]) 07/08/2021   Pure hypercholesterolemia 07/08/2021   Morbid obesity ([place]) 07/08/2021   Chemotherapy-induced peripheral neuropathy ([place]) 07/08/2021   Genetic testing 03/17/2021   Family history of breast cancer 02/24/2021   Family history of pancreatic cancer 02/24/2021   Family history of colon cancer 02/24/2021   Family history of lung cancer 02/24/2021   Malignant neoplasm of upper-outer quadrant of right breast in female, estrogen receptor positive ([place]) 02/15/2021   Obesity    Constipation 08/30/2011    PCP: Sela Hilding  REFERRING PROVIDER: Wilber Bihari NP  REFERRING DIAG: S/p Right  breast Cancer  THERAPY DIAG:  Stiffness of right shoulder, not elsewhere classified  Localized edema  Abnormal posture  Malignant neoplasm of upper-outer quadrant of right breast in female, estrogen receptor positive ([place])  ONSET DATE: 11/08/2021  Rationale for Evaluation and Treatment Rehabilitation  SUBJECTIVE  SUBJECTIVE STATEMENT: My breast is going down. My skin color is improving also. I am using my Flexi touch now and it works well. I think I have improved. I still get the shooting pain once in a while but I think that is the nerves coming back. I got itchy on my right breast Monday, but rubbed it down with alcohol and it was better. I feel like swelling is almost gone now. The girl from [facility] [employer] came and adjusted everything.     PERTINENT HISTORY:  Pts.Screening mammogram determined  mass in the right breast  Diagnostic mammogram and [place]: a persistent irregular mass 1.2 cm within the upper-outer right breast. 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 Pt has finished neoadjuvant chemo and had right Lumpectomy with SLNB on 08/17/2021 with 0/3 LN's. She completed radiation 11/08/2021 and is on anti-estrogens      PAIN:  Are you having pain? No  Pain location: right breast Pain orientation: Right  PAIN TYPE: sharp Pain description: intermittent  Aggravating factors: not sure Relieving factors: not sure  PRECAUTIONS: Right UE lymphedema risk  WEIGHT BEARING RESTRICTIONS No  FALLS:  Has patient fallen in last 6 months? 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 Indentation from bra noted on right breast  SENSATION:  Light touch: Deficits lateral breast    POSTURE: forward head founded shoulders  UPPER EXTREMITY AROM/PROM:  A/PROM RIGHT   eval  RIGHT 06/28/2022  Shoulder extension    Shoulder flexion 144 155  Shoulder abduction 153 158  Shoulder internal rotation    Shoulder external rotation      (Blank rows = not tested)  A/PROM LEFT   eval  Shoulder extension   Shoulder flexion 153  Shoulder abduction 155  Shoulder internal  rotation   Shoulder external rotation     (Blank rows = not tested)   CERVICAL AROM: All within normal limits:   UPPER EXTREMITY STRENGTH: NT    LYMPHEDEMA ASSESSMENTS:   SURGERY TYPE/DATE: 08/17/2021 right lumpectomy with SLNB  NUMBER OF LYMPH NODES REMOVED: 0/3  CHEMOTHERAPY: neoadjuvant  RADIATION:yes ended 11/08/2021  HORMONE TREATMENT: Yes  INFECTIONS: No  LYMPHEDEMA ASSESSMENTS:   LANDMARK RIGHT  eval  10 cm proximal to olecranon process 37.5  Olecranon process 27.0  10 cm proximal to ulnar styloid process 22.6  Just proximal to ulnar styloid process 17  Across hand at thumb web space 20.7  At base of 2nd digit 6.5  (Blank rows = not tested)  LANDMARK LEFT  eval  10 cm proximal to olecranon process 37.3  Olecranon process 27.8  10 cm proximal to ulnar styloid process 22.7  Just proximal to ulnar styloid process 17.0  Across hand at thumb web space 20.4  At base of 2nd digit 6.2  (Blank rows = not tested)    L-DEX FLOWSHEETS - 06/28/22 1500  L-DEX LYMPHEDEMA SCREENING   Measurement Type Unilateral    L-DEX MEASUREMENT EXTREMITY Upper Extremity    POSITION  Standing    DOMINANT SIDE Right    At Risk Side Right    BASELINE SCORE (UNILATERAL) -0.3               BREAST COMPLAINTS SURVEY: 21  TODAY'S TREATMENT   06/28/2022 Measured ROM and checked goals for DC Fibrosis still noted at medial breast  and mildly at lateral breast today, but pt not wearing sports bra and hasn't used flexi touch since Sunday, In supine: Short neck, 5 diaphragmatic breaths, L axillary nodes and establishment of interaxillary pathway, R inguinal nodes and establishment of axilloinguinal pathway, then R breast moving fluid towards pathways spending extra time in any areas of fibrosis then retracing all steps. Discussed necessity of being compliant with flexi touch and compression bra to manage lymphedema. Performed SOZO and had pt schedule for  next   05/30/2022  Soft tissue work to right pectorals, UT and lats in supine  with cocoabutter. PROM right shoulder flexion, scaption, abduction, IR and ER In supine: Short neck, 5 diaphragmatic breaths, L axillary nodes and establishment of interaxillary pathway, R inguinal nodes and establishment of axilloinguinal pathway, then R breast moving fluid towards pathways spending extra time in any areas of fibrosis then retracing all steps.   05/25/2022  Soft tissue work to right pectorals, UT and lats in supine  with cocoabutter. PROM right shoulder flexion, scaption, abduction In supine: Short neck, 5 diaphragmatic breaths, L axillary nodes and establishment of interaxillary pathway, R inguinal nodes and establishment of axilloinguinal pathway, then R breast moving fluid towards pathways spending extra time in any areas of fibrosis then retracing all steps.  05/23/2022 Nodular area palpated in posterior axillary region;pt will call MD about MRI that she was supposed to have been called about. In supine: Short neck, 5 diaphragmatic breaths, L axillary nodes and establishment of interaxillary pathway, R inguinal nodes and establishment of axilloinguinal pathway, then R breast moving fluid towards pathways spending extra time in any areas of fibrosis then retracing all steps. Discussed purchasing a sports bra if she can't afford compression bra right now. Also advised her to call Jinny Blossom today about getting her Flexi touch adjusted properly. She is also to call MD about MRI for breast that was recommended since they never called her back. Looked up [employer] and cheapest is compression guru at $80.00 and free shipping.   05/11/2022 In supine: Short neck, 5 diaphragmatic breaths, L axillary nodes and establishment of interaxillary pathway, R inguinal nodes and establishment of axilloinguinal pathway, then R breast moving fluid towards pathways spending extra time in any areas of  fibrosis then retracing all steps. Showed pt solaris swell spot again and pt will order when she gets paid   05/03/2022 In supine: Short neck, 5 diaphragmatic breaths, L axillary nodes and establishment of interaxillary pathway, R inguinal nodes and establishment of axilloinguinal pathway, then R breast moving fluid towards pathways spending extra time in any areas of fibrosis then retracing all steps. Created foam chip pack for pt to wear in her bra  04/25/2022 In supine: Short neck, 5 diaphragmatic breaths, L axillary nodes and establishment of interaxillary pathway, R inguinal nodes and establishment of axilloinguinal pathway, then R breast moving fluid towards pathways spending extra time in any areas of fibrosis then retracing all steps. Soft tissue work to right pectorals, UT and lats in supine  with cocoabutter.  LTR with arms outstretched with knees to the left to stretch right pecs/lats Asked pt to try wearing her compression bra again.    04/20/2022 No treatment today. Pt will have her  Breast MRI tomorrow. She is to bring in her least used compression bra next visit to assess and place foam to decrease firmness. Will also call flexi touch after she gets MRI results to see if any settings need to be changed  PATIENT EDUCATION:  Education details: POC Person educated: Patient Education method: Explanation Education comprehension: verbalized understanding   HOME EXERCISE PROGRAM: None given today  ASSESSMENT:  CLINICAL IMPRESSION: Pt has achieved all goals established at initial evaluation. She is pleased with her Flexi touch and is getting good results with the pump and her sports bra. Her shoulder ROM has improved but she does still feel a little tightness in the right pectorals. She still has noted fibrosis today at the medial and lateral breast with enlarged pores however, she has not used her pump or compression bra for several days. Explained to pt that this is for a  lifetime and must be maintained. She knows what she needs to do and feels swelling has improved greater than 50% overall. She feels ready to be released to independent self management.  OBJECTIVE IMPAIRMENTS decreased activity tolerance, decreased knowledge of condition, decreased ROM, postural dysfunction, and pain.   ACTIVITY LIMITATIONS lifting and reach over head  PARTICIPATION LIMITATIONS:  pt does all she needs to but with increased swelling  PERSONAL FACTORS Time since onset of injury/illness/exacerbation and 1-2 comorbidities: Right breast surgery s/p chemo and radiation  are also affecting patient's functional outcome.   REHAB POTENTIAL: Good  CLINICAL DECISION MAKING: Stable/uncomplicated  EVALUATION COMPLEXITY: Low  GOALS: Goals reviewed with patient? Yes  STG=LONG TERM GOALS: Target date: 08/09/2022     Pt will be independent in self MLD  Baseline:  Goal status: MET  2.  Pt will note decreased right breast swelling by 50% or more Baseline:  Goal status: MET  3. Pt will be able to reduce right breast swelling with compression bra   Baseline:  Goal status: MET   4.  Pts right shoulder flexion will be 150 degrees or better for improved reaching Baseline: 144 Goal status: MET 5.  Pts Flexitouch will be beneficial for reducing right breast swelling Baseline:  Goal status: MET   PLAN: PT FREQUENCY: 2x/week  PT DURATION: 6 weeks  PLANNED INTERVENTIONS: Therapeutic exercises, Therapeutic activity, Patient/Family education, Self Care, Orthotic/Fit training, Manual lymph drainage, Compression bandaging, scar mobilization, Taping, and Manual therapy  PLAN FOR NEXT SESSION: Discharge PT to independent self management. PHYSICAL THERAPY DISCHARGE SUMMARY  Visits from Start of Care: 9  Current functional level related to goals / functional outcomes: Achieved all goals   Remaining deficits: Pt has intermittent breast fibrosis which improves with compliance with  Flexi and compression bra   Education / Equipment: Flexi touch, Compression bra   Patient agrees to discharge. Patient goals were met. Patient is being discharged due to meeting the stated rehab goals.  Claris Pong, PT 06/28/2022, 3:48 PM

## 2022-09-12 ENCOUNTER — Other Ambulatory Visit: Payer: Self-pay | Admitting: *Deleted

## 2022-09-12 DIAGNOSIS — Z170 Estrogen receptor positive status [ER+]: Secondary | ICD-10-CM

## 2022-09-13 ENCOUNTER — Inpatient Hospital Stay: Payer: BC Managed Care – PPO | Attending: Hematology and Oncology

## 2022-09-13 DIAGNOSIS — C50411 Malignant neoplasm of upper-outer quadrant of right female breast: Secondary | ICD-10-CM | POA: Diagnosis present

## 2022-09-13 LAB — CBC WITH DIFFERENTIAL (CANCER CENTER ONLY)
Abs Immature Granulocytes: 0.02 10*3/uL (ref 0.00–0.07)
Basophils Absolute: 0 10*3/uL (ref 0.0–0.1)
Basophils Relative: 1 %
Eosinophils Absolute: 0.1 10*3/uL (ref 0.0–0.5)
Eosinophils Relative: 1 %
HCT: 35.1 % — ABNORMAL LOW (ref 36.0–46.0)
Hemoglobin: 11.7 g/dL — ABNORMAL LOW (ref 12.0–15.0)
Immature Granulocytes: 0 %
Lymphocytes Relative: 20 %
Lymphs Abs: 1.3 10*3/uL (ref 0.7–4.0)
MCH: 28.7 pg (ref 26.0–34.0)
MCHC: 33.3 g/dL (ref 30.0–36.0)
MCV: 86 fL (ref 80.0–100.0)
Monocytes Absolute: 0.8 10*3/uL (ref 0.1–1.0)
Monocytes Relative: 12 %
Neutro Abs: 4.3 10*3/uL (ref 1.7–7.7)
Neutrophils Relative %: 66 %
Platelet Count: 230 10*3/uL (ref 150–400)
RBC: 4.08 MIL/uL (ref 3.87–5.11)
RDW: 13.8 % (ref 11.5–15.5)
WBC Count: 6.5 10*3/uL (ref 4.0–10.5)
nRBC: 0 % (ref 0.0–0.2)

## 2022-09-13 LAB — CMP (CANCER CENTER ONLY)
ALT: 15 U/L (ref 0–44)
AST: 12 U/L — ABNORMAL LOW (ref 15–41)
Albumin: 3.7 g/dL (ref 3.5–5.0)
Alkaline Phosphatase: 55 U/L (ref 38–126)
Anion gap: 7 (ref 5–15)
BUN: 13 mg/dL (ref 6–20)
CO2: 23 mmol/L (ref 22–32)
Calcium: 8.5 mg/dL — ABNORMAL LOW (ref 8.9–10.3)
Chloride: 107 mmol/L (ref 98–111)
Creatinine: 0.64 mg/dL (ref 0.44–1.00)
GFR, Estimated: >60 mL/min (ref >60)
Glucose, Bld: 92 mg/dL (ref 70–99)
Potassium: 4 mmol/L (ref 3.5–5.1)
Sodium: 137 mmol/L (ref 135–145)
Total Bilirubin: 0.3 mg/dL (ref 0.3–1.2)
Total Protein: 7.1 g/dL (ref 6.5–8.1)

## 2022-09-15 LAB — FOLLICLE STIMULATING HORMONE: FSH: 12.8 m[IU]/mL

## 2022-09-18 LAB — ESTRADIOL, ULTRA SENS: Estradiol, Sensitive: 5.5 pg/mL

## 2022-09-22 NOTE — Progress Notes (Signed)
HEMATOLOGY-ONCOLOGY TELEPHONE VISIT PROGRESS NOTE  I connected with our patient on 09/25/22 at 12:30 PM EDT by telephone and verified that I am speaking with the correct person using two identifiers.  I discussed the limitations, risks, security and privacy concerns of performing an evaluation and management service by telephone and the availability of in person appointments.  I also discussed with the patient that there may be a patient responsible charge related to this service. The patient expressed understanding and agreed to proceed.   History of Present Illness:  Sandra Bishop is a  52 y.o. with above-mentioned history of invasive ductal carcinoma of the right breast. She presents to the clinic for via telephone follow-up to discuss labs.  Oncology History  Malignant neoplasm of upper-outer quadrant of right breast in female, estrogen receptor positive ([place])  02/15/2021 Initial Diagnosis   Screening mammogram: a possible mass in the right breast  Diagnostic mammogram and [place]: a persistent irregular mass 1.2 cm within the upper-outer right breast. 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    02/23/2021 Cancer Staging   Staging form: Breast, AJCC 8th Edition - Clinical stage from 02/23/2021: Stage IIB (cT1c, cN1(f), cM0, G3, ER+, PR-, HER2-) - Signed by [name], MD on 02/23/2021 Stage prefix: Initial diagnosis Method of lymph node assessment: Core biopsy Histologic grading system: 3 grade system   03/04/2021 - 07/01/2021 Neo-Adjuvant Chemotherapy   Received four cycles of neoadjuvant Adriamycin and Cytoxan from 03/04/2021-04/14/2021.  Followed by 9 cycles of Taxol from 04/28/2021-07/01/2021.  Taxol was dose reduced beginning with cycle 6 due to restless legs and stopped after 9 cycles due to peripheral neuropathy.    03/10/2021 Genetic Testing   Negative genetic testing. No pathogenic variants identified on the  [facility] CancerNext-Expanded+RNA panel. The report date is 03/10/2021.  The CancerNext-Expanded + RNAinsight gene panel offered by [employer] and includes sequencing and rearrangement analysis for the following 77 genes: IP, ALK, APC*, ATM*, AXIN2, BAP1, BARD1, BLM, BMPR1A, BRCA1*, BRCA2*, BRIP1*, CDC73, CDH1*,CDK4, CDKN1B, CDKN2A, CHEK2*, CTNNA1, DICER1, FANCC, FH, FLCN, GALNT12, KIF1B, LZTR1, MAX, MEN1, MET, MLH1*, MSH2*, MSH3, MSH6*, MUTYH*, NBN, NF1*, NF2, NTHL1, PALB2*, PHOX2B, PMS2*, POT1, PRKAR1A, PTCH1, PTEN*, RAD51C*, RAD51D*,RB1, RECQL, RET, SDHA, SDHAF2, SDHB, SDHC, SDHD, SMAD4, SMARCA4, SMARCB1, SMARCE1, STK11, SUFU, TMEM127, TP53*,TSC1, TSC2, VHL and XRCC2 (sequencing and deletion/duplication); EGFR, EGLN1, HOXB13, KIT, MITF, PDGFRA, POLD1 and POLE (sequencing only); EPCAM and GREM1 (deletion/duplication only).   09/20/2021 - 11/08/2021 Radiation Therapy   Site Technique Total Dose (Gy) Dose per Fx (Gy) Completed Fx Beam Energies  Breast, Right: Breast_R 3D 50.4/50.4 1.8 28/28 10X  Breast, Right: Breast_R_SCLV 3D 50.4/50.4 1.8 28/28 6X, 10X  Breast, Right: Breast_R_Bst 3D 10/10 2 5/5 6X, 10X     10/31/2021 -  Anti-estrogen oral therapy   Tamoxifen x 2-5 years followed by switching to aromatase inhibitor once menopausal     REVIEW OF SYSTEMS:   Constitutional: Denies fevers, chills or abnormal weight loss All other systems were reviewed with the patient and are negative. Observations/Objective:     Assessment Plan:  Malignant neoplasm of upper-outer quadrant of right breast in female, estrogen receptor positive ([name]) 02/15/2021:Screening mammogram: a possible mass in the right breast  Diagnostic mammogram and [place]: a persistent irregular mass 1.2 cm within the upper-outer right breast. 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    Recommendation based on multidisciplinary  tumor board: 1. Neoadjuvant chemotherapy  with Adriamycin and Cytoxan dose dense 4 followed by Taxol weekly 9 (stopped early for AEs)  2. Followed by breast conserving surgery with  targeted axillary dissection: 08/05/2021: Pathologic complete response, 0/3 lymph nodes negative 3. Followed by adjuvant radiation therapy 09/30/2021-11/07/2021 4.  Followed by adjuvant antiestrogen therapy (patient was premenopausal prior to chemo)  ------------------------------------------------------------------------------------------------------------------------------------ Tamoxifen toxicities: Other than hot flashes she is tolerating tamoxifen extremely well. I encouraged her to take tamoxifen on a consistent basis at the same time   Abemaciclib cannot be started because she is still premenopausal and requires tamoxifen.   Lab review: 10/09/22: FSH 12.8 (still premenopausal), estradiol 5.5   Breast cancer surveillance:  Mammogram right breast: 05/05/2022: Benign We will recheck this lab again in 6 months. Follow-up 2 weeks after the lab to discuss if she is in menopause so that she can go on clinical trial with adjuvant Ribociclib   I discussed the assessment and treatment plan with the patient. The patient was provided an opportunity to ask questions and all were answered. The patient agreed with the plan and demonstrated an understanding of the instructions. The patient was advised to call back or seek an in-person evaluation if the symptoms worsen or if the condition fails to improve as anticipated.   I provided 12 minutes of non-face-to-face time during this encounter.  This includes time for charting and coordination of care   [name], MD  I Gardiner Coins am acting as a scribe for Dr.Vinay Gudena  I have reviewed the above documentation for accuracy and completeness, and I agree with the above.

## 2022-09-25 ENCOUNTER — Inpatient Hospital Stay: Payer: BC Managed Care – PPO | Admitting: Hematology and Oncology

## 2022-09-25 DIAGNOSIS — Z170 Estrogen receptor positive status [ER+]: Secondary | ICD-10-CM | POA: Diagnosis not present

## 2022-09-25 DIAGNOSIS — C50411 Malignant neoplasm of upper-outer quadrant of right female breast: Secondary | ICD-10-CM | POA: Diagnosis not present

## 2022-09-25 NOTE — Assessment & Plan Note (Signed)
02/15/2021:Screening mammogram: a possible mass in the right breast  Diagnostic mammogram and [place]: a persistent irregular mass 1.2 cm within the upper-outer right breast. 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    Recommendation based on multidisciplinary tumor board: 1. Neoadjuvant chemotherapy with Adriamycin and Cytoxan dose dense 4 followed by Taxol weekly 9 (stopped early for AEs)  2. Followed by breast conserving surgery with  targeted axillary dissection: 08/05/2021: Pathologic complete response, 0/3 lymph nodes negative 3. Followed by adjuvant radiation therapy 09/30/2021-11/07/2021 4.  Followed by adjuvant antiestrogen therapy (patient was premenopausal prior to chemo)  ------------------------------------------------------------------------------------------------------------------------------------ Tamoxifen toxicities: Other than hot flashes she is tolerating tamoxifen extremely well. I encouraged her to take tamoxifen on a consistent basis at the same time   Abemaciclib cannot be started because she is still premenopausal and requires tamoxifen.   Lab review: FSH 12.8 (still premenopausal), estradiol 5.5   Breast cancer surveillance:  Mammogram right breast: 05/05/2022: Benign We will recheck this lab again in 6 months.

## 2022-09-28 ENCOUNTER — Telehealth: Payer: Self-pay | Admitting: Hematology and Oncology

## 2022-09-28 NOTE — Telephone Encounter (Signed)
Scheduled appointment per 3/11 los. Unable to get through due to call being unavailable. Called patient on 3/13 and 3/14.

## 2022-10-08 ENCOUNTER — Other Ambulatory Visit: Payer: Self-pay | Admitting: Hematology and Oncology

## 2022-10-09 ENCOUNTER — Ambulatory Visit: Payer: BC Managed Care – PPO

## 2022-10-31 ENCOUNTER — Telehealth: Payer: Self-pay | Admitting: *Deleted

## 2022-10-31 NOTE — Telephone Encounter (Signed)
Received call from pt with complaint of knee joint pain and elbow joint pain x several months.  Pt states she was recently examined by her PCP who recommended symptoms were related to Tamoxifen.  Per MD Tamoxifen typically does not cause joint pain but would like for pt to stop Tamoxifen x 2 weeks to see if symptoms resolve and f/u with our office.  Pt educated and verbalized understanding.  Message sent to scheduling team.

## 2022-11-06 ENCOUNTER — Telehealth: Payer: Self-pay | Admitting: Hematology and Oncology

## 2022-11-14 ENCOUNTER — Inpatient Hospital Stay: Payer: BC Managed Care – PPO | Attending: Hematology and Oncology | Admitting: Adult Health

## 2022-11-14 ENCOUNTER — Ambulatory Visit: Payer: BC Managed Care – PPO | Admitting: Adult Health

## 2022-11-14 ENCOUNTER — Encounter: Payer: Self-pay | Admitting: Adult Health

## 2022-11-14 ENCOUNTER — Inpatient Hospital Stay: Payer: BC Managed Care – PPO

## 2022-11-14 VITALS — BP 141/83 | HR 97 | Temp 97.5°F | Resp 14 | Wt 196.4 lb

## 2022-11-14 DIAGNOSIS — C50411 Malignant neoplasm of upper-outer quadrant of right female breast: Secondary | ICD-10-CM

## 2022-11-14 DIAGNOSIS — Z79810 Long term (current) use of selective estrogen receptor modulators (SERMs): Secondary | ICD-10-CM | POA: Insufficient documentation

## 2022-11-14 DIAGNOSIS — C773 Secondary and unspecified malignant neoplasm of axilla and upper limb lymph nodes: Secondary | ICD-10-CM | POA: Insufficient documentation

## 2022-11-14 DIAGNOSIS — E559 Vitamin D deficiency, unspecified: Secondary | ICD-10-CM

## 2022-11-14 DIAGNOSIS — Z170 Estrogen receptor positive status [ER+]: Secondary | ICD-10-CM | POA: Diagnosis not present

## 2022-11-14 DIAGNOSIS — R5383 Other fatigue: Secondary | ICD-10-CM | POA: Insufficient documentation

## 2022-11-14 DIAGNOSIS — R52 Pain, unspecified: Secondary | ICD-10-CM | POA: Diagnosis not present

## 2022-11-14 LAB — CMP (CANCER CENTER ONLY)
ALT: 13 U/L (ref 0–44)
AST: 12 U/L — ABNORMAL LOW (ref 15–41)
Albumin: 3.7 g/dL (ref 3.5–5.0)
Alkaline Phosphatase: 57 U/L (ref 38–126)
Anion gap: 6 (ref 5–15)
BUN: 14 mg/dL (ref 6–20)
CO2: 27 mmol/L (ref 22–32)
Calcium: 9.5 mg/dL (ref 8.9–10.3)
Chloride: 106 mmol/L (ref 98–111)
Creatinine: 0.89 mg/dL (ref 0.44–1.00)
GFR, Estimated: >60 mL/min (ref >60)
Glucose, Bld: 102 mg/dL — ABNORMAL HIGH (ref 70–99)
Potassium: 3.7 mmol/L (ref 3.5–5.1)
Sodium: 139 mmol/L (ref 135–145)
Total Bilirubin: 0.2 mg/dL — ABNORMAL LOW (ref 0.3–1.2)
Total Protein: 7.5 g/dL (ref 6.5–8.1)

## 2022-11-14 LAB — VITAMIN D 25 HYDROXY (VIT D DEFICIENCY, FRACTURES): Vit D, 25-Hydroxy: 40.45 ng/mL (ref 30–100)

## 2022-11-14 LAB — CBC WITH DIFFERENTIAL (CANCER CENTER ONLY)
Abs Immature Granulocytes: 0.02 10*3/uL (ref 0.00–0.07)
Basophils Absolute: 0 10*3/uL (ref 0.0–0.1)
Basophils Relative: 0 %
Eosinophils Absolute: 0.1 10*3/uL (ref 0.0–0.5)
Eosinophils Relative: 1 %
HCT: 35.5 % — ABNORMAL LOW (ref 36.0–46.0)
Hemoglobin: 11.6 g/dL — ABNORMAL LOW (ref 12.0–15.0)
Immature Granulocytes: 0 %
Lymphocytes Relative: 22 %
Lymphs Abs: 1.3 10*3/uL (ref 0.7–4.0)
MCH: 28.9 pg (ref 26.0–34.0)
MCHC: 32.7 g/dL (ref 30.0–36.0)
MCV: 88.5 fL (ref 80.0–100.0)
Monocytes Absolute: 0.6 10*3/uL (ref 0.1–1.0)
Monocytes Relative: 9 %
Neutro Abs: 4.2 10*3/uL (ref 1.7–7.7)
Neutrophils Relative %: 68 %
Platelet Count: 199 10*3/uL (ref 150–400)
RBC: 4.01 MIL/uL (ref 3.87–5.11)
RDW: 13.7 % (ref 11.5–15.5)
WBC Count: 6.2 10*3/uL (ref 4.0–10.5)
nRBC: 0 % (ref 0.0–0.2)

## 2022-11-14 LAB — C-REACTIVE PROTEIN: CRP: 0.9 mg/dL (ref <1.0)

## 2022-11-14 LAB — SEDIMENTATION RATE: Sed Rate: 29 mm/hr — ABNORMAL HIGH (ref 0–22)

## 2022-11-14 NOTE — Assessment & Plan Note (Signed)
02/15/2021:Screening mammogram: a possible mass in the right breast  Diagnostic mammogram and [place]: a persistent irregular mass 1.2 cm within the upper-outer right breast. 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    Recommendation based on multidisciplinary tumor board: 1. Neoadjuvant chemotherapy with Adriamycin and Cytoxan dose dense 4 followed by Taxol weekly 9 (stopped early for AEs)  2. Followed by breast conserving surgery with  targeted axillary dissection: 08/05/2021: Pathologic complete response, 0/3 lymph nodes negative 3. Followed by adjuvant radiation therapy 09/30/2021-11/07/2021 4.  Followed by adjuvant antiestrogen therapy (patient was premenopausal prior to chemo)  ------------------------------------------------------------------------------------------------------------------------------------ Tamoxifen toxicities: Other than hot flashes she is tolerating tamoxifen extremely well. I encouraged her to take tamoxifen on a consistent basis at the same time   Abemaciclib cannot be started because she is still premenopausal and requires tamoxifen.   Lab review: FSH 12.8 (still premenopausal), estradiol 5.5   Breast cancer surveillance:  Mammogram right breast: 05/05/2022: Benign  Fatigue and arthralgias: We will obtain lab testing today to further evaluate.  This is unrelated to tamoxifen since none of it resolved while she was off of the tamoxifen.  I recommended that she resume taking tamoxifen.  We will follow-up with her on her lab results.  She will return in September for follow-up with Dr. Pamelia Hoit.

## 2022-11-14 NOTE — Progress Notes (Signed)
[facility] Cancer Follow up:    Sandra Hale, MD [address] [place] [address]   DIAGNOSIS:  Cancer Staging  Malignant neoplasm of upper-outer quadrant of right breast in female, estrogen receptor positive (HCC) Staging form: Breast, AJCC 8th Edition - Clinical stage from 02/23/2021: Stage IIB (cT1c, cN1(f), cM0, G3, ER+, PR-, HER2-) - Signed by Serena Croissant, MD on 02/23/2021 Stage prefix: Initial diagnosis Method of lymph node assessment: Core biopsy Histologic grading system: 3 grade system   SUMMARY OF ONCOLOGIC HISTORY: Oncology History  Malignant neoplasm of upper-outer quadrant of right breast in female, estrogen receptor positive (HCC)  02/15/2021 Initial Diagnosis   Screening mammogram: a possible mass in the right breast  Diagnostic mammogram and [place]: a persistent irregular mass 1.2 cm within the upper-outer right breast. 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    02/23/2021 Cancer Staging   Staging form: Breast, AJCC 8th Edition - Clinical stage from 02/23/2021: Stage IIB (cT1c, cN1(f), cM0, G3, ER+, PR-, HER2-) - Signed by Serena Croissant, MD on 02/23/2021 Stage prefix: Initial diagnosis Method of lymph node assessment: Core biopsy Histologic grading system: 3 grade system   03/04/2021 - 07/01/2021 Neo-Adjuvant Chemotherapy   Received four cycles of neoadjuvant Adriamycin and Cytoxan from 03/04/2021-04/14/2021.  Followed by 9 cycles of Taxol from 04/28/2021-07/01/2021.  Taxol was dose reduced beginning with cycle 6 due to restless legs and stopped after 9 cycles due to peripheral neuropathy.    03/10/2021 Genetic Testing   Negative genetic testing. No pathogenic variants identified on the [facility] CancerNext-Expanded+RNA panel. The report date is 03/10/2021.  The CancerNext-Expanded + RNAinsight gene panel offered by [employer] and includes sequencing and rearrangement  analysis for the following 77 genes: IP, ALK, APC*, ATM*, AXIN2, BAP1, BARD1, BLM, BMPR1A, BRCA1*, BRCA2*, BRIP1*, CDC73, CDH1*,CDK4, CDKN1B, CDKN2A, CHEK2*, CTNNA1, DICER1, FANCC, FH, FLCN, GALNT12, KIF1B, LZTR1, MAX, MEN1, MET, MLH1*, MSH2*, MSH3, MSH6*, MUTYH*, NBN, NF1*, NF2, NTHL1, PALB2*, PHOX2B, PMS2*, POT1, PRKAR1A, PTCH1, PTEN*, RAD51C*, RAD51D*,RB1, RECQL, RET, SDHA, SDHAF2, SDHB, SDHC, SDHD, SMAD4, SMARCA4, SMARCB1, SMARCE1, STK11, SUFU, TMEM127, TP53*,TSC1, TSC2, VHL and XRCC2 (sequencing and deletion/duplication); EGFR, EGLN1, HOXB13, KIT, MITF, PDGFRA, POLD1 and POLE (sequencing only); EPCAM and GREM1 (deletion/duplication only).   09/20/2021 - 11/08/2021 Radiation Therapy   Site Technique Total Dose (Gy) Dose per Fx (Gy) Completed Fx Beam Energies  Breast, Right: Breast_R 3D 50.4/50.4 1.8 28/28 10X  Breast, Right: Breast_R_SCLV 3D 50.4/50.4 1.8 28/28 6X, 10X  Breast, Right: Breast_R_Bst 3D 10/10 2 5/5 6X, 10X     10/31/2021 -  Anti-estrogen oral therapy   Tamoxifen x 2-5 years followed by switching to aromatase inhibitor once menopausal     CURRENT THERAPY: tamoxifen  INTERVAL HISTORY: Sandra Bishop 52 y.o. female returns for follow-up after being off tamoxifen for the past 2 weeks due to joint aches and pains.  She notes that since being off the tamoxifen she is continuing to have aches in her knees.  This is bilaterally.  She notes that she is also tired and is unsure what could be going on to cause this to continue.  She has been losing weight and is down about 50 pounds.  She feels somewhat better but felt that she would be feeling more improved than she is at this point.  She denies any breast changes and her next breast cancer follow-up with Dr. Pamelia Hoit is scheduled for September 2024.  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 07/22/2021   Hyperglycemia due to type 2 diabetes mellitus (HCC) 07/08/2021   Pure  hypercholesterolemia 07/08/2021   Morbid obesity (HCC) 07/08/2021   Chemotherapy-induced peripheral neuropathy (HCC) 07/08/2021   Genetic testing 03/17/2021   Family history of breast cancer 02/24/2021   Family history of pancreatic cancer 02/24/2021   Family history of colon cancer 02/24/2021   Family history of lung cancer 02/24/2021   Malignant neoplasm of upper-outer quadrant of right breast in female, estrogen receptor positive (HCC) 02/15/2021   Obesity    Constipation 08/30/2011    has No Known Allergies.  MEDICAL HISTORY: Past Medical History:  Diagnosis Date   Allergy    Anemia    Breast cancer (HCC)    Right   Constipation    Diabetes mellitus without complication (HCC)    Family history of adverse reaction to anesthesia    Aunt is hard to wake up from general anesthesia   Family history of breast cancer    Family history of colon cancer    Family history of lung cancer    Family history of pancreatic cancer    History of migraine    during pregancy   Obesity    Palpitations     SURGICAL HISTORY: Past Surgical History:  Procedure Laterality Date   BREAST BIOPSY Right 02/11/2021   x2   BREAST BIOPSY Right 03/22/2021   x3   BREAST LUMPECTOMY WITH RADIOACTIVE SEED AND SENTINEL LYMPH NODE BIOPSY Right 08/17/2021   Procedure: RIGHT BREAST LUMPECTOMY WITH RADIOACTIVE SEED AND SENTINEL LYMPH NODE BIOPSY;  Surgeon: Almond Lint, MD;  Location: [facility] OR;  Service: General;  Laterality: Right;   EYE SURGERY Right    PORT-A-CATH REMOVAL N/A 08/17/2021   Procedure: REMOVAL PORT-A-CATH;  Surgeon: Almond Lint, MD;  Location: [facility] OR;  Service: General;  Laterality: N/A;   PORTACATH PLACEMENT N/A 03/02/2021   Procedure: INSERTION PORT-A-CATH;  Surgeon: Almond Lint, MD;  Location: [facility] ORS;  Service: General;  Laterality: N/A;   RADIOACTIVE SEED GUIDED AXILLARY SENTINEL LYMPH NODE Right 08/17/2021   Procedure: RADIOACTIVE SEED GUIDED RIGHT SENTINEL LYMPH NODE EXCISION;  Surgeon:  Almond Lint, MD;  Location: [facility] OR;  Service: General;  Laterality: Right;   RADIOACTIVE SEED GUIDED EXCISIONAL BREAST BIOPSY Left 08/17/2021   Procedure: RADIOACTIVE SEED GUIDED EXCISIONAL LEFT BREAST BIOPSY;  Surgeon: Almond Lint, MD;  Location: [facility] OR;  Service: General;  Laterality: Left;   TUBAL LIGATION      SOCIAL HISTORY: Social History   Socioeconomic History   Marital status: Divorced    Spouse name: Not on file   Number of children: 2   Years of education: Not on file   Highest education level: Not on file  Occupational History   Not on file  Tobacco Use   Smoking status: Former    Types: Cigarettes    Quit date: 12/28/2010    Years since quitting: 11.8   Smokeless tobacco: Never   Tobacco comments:    quit in 2012, previously smoked 15 years  Vaping Use   Vaping Use: Not on file  Substance and Sexual Activity   Alcohol use: Not Currently    Comment: occ   Drug use: No   Sexual activity: Yes    Birth control/protection: Surgical  Other Topics Concern   Not on file  Social History Narrative   Not on file   Social Determinants  of Health   Financial Resource Strain: Not on file  Food Insecurity: Not on file  Transportation Needs: Not on file  Physical Activity: Not on file  Stress: Not on file  Social Connections: Not on file  Intimate Partner Violence: Not on file    FAMILY HISTORY: Family History  Problem Relation Age of Onset   Hypertension Mother    Pancreatic cancer Father 61   Breast cancer Half-Sister 23       negative genetic testing   Colon cancer Maternal Uncle        dx >50   Lung cancer Maternal Uncle        dx 85s, hx smoking   Cancer Paternal Aunt        unknown type, dx early 18s   Heart failure Maternal Grandmother    Breast cancer Other 18       mother's first cousin    Review of Systems  Constitutional:  Positive for fatigue. Negative for appetite change, chills, fever and unexpected weight change.  HENT:   Negative for  hearing loss, lump/mass and trouble swallowing.   Eyes:  Negative for eye problems and icterus.  Respiratory:  Negative for chest tightness, cough and shortness of breath.   Cardiovascular:  Negative for chest pain, leg swelling and palpitations.  Gastrointestinal:  Negative for abdominal distention, abdominal pain, constipation, diarrhea, nausea and vomiting.  Endocrine: Negative for hot flashes.  Genitourinary:  Negative for difficulty urinating.   Musculoskeletal:  Positive for arthralgias.  Skin:  Negative for itching and rash.  Neurological:  Negative for dizziness, extremity weakness, headaches and numbness.  Hematological:  Negative for adenopathy. Does not bruise/bleed easily.  Psychiatric/Behavioral:  Negative for depression. The patient is not nervous/anxious.       PHYSICAL EXAMINATION   Onc Performance Status - 11/14/22 1431       KPS SCALE   KPS % SCORE Normal activity with effort, some s/s of disease             Vitals:   11/14/22 1425  BP: (!) 141/83  Pulse: 97  Resp: 14  Temp: (!) 97.5 F (36.4 C)  SpO2: 100%   Patient appears well.  She is in no apparent distress.  Skin visualized without rash or lesions sclera anicteric breathing is nonlabored.   LABORATORY DATA:  CBC    Component Value Date/Time   WBC 6.5 09/13/2022 0917   WBC 7.2 08/12/2021 0956   RBC 4.08 09/13/2022 0917   HGB 11.7 (L) 09/13/2022 0917   HGB 11.5 08/11/2020 1654   HCT 35.1 (L) 09/13/2022 0917   HCT 35.9 08/11/2020 1654   PLT 230 09/13/2022 0917   PLT 330 10/06/2019 1516   MCV 86.0 09/13/2022 0917   MCV 81 08/11/2020 1654   MCH 28.7 09/13/2022 0917   MCHC 33.3 09/13/2022 0917   RDW 13.8 09/13/2022 0917   RDW 14.1 08/11/2020 1654   LYMPHSABS 1.3 09/13/2022 0917   LYMPHSABS 2.9 08/11/2020 1654   MONOABS 0.8 09/13/2022 0917   EOSABS 0.1 09/13/2022 0917   EOSABS 0.2 08/11/2020 1654   BASOSABS 0.0 09/13/2022 0917   BASOSABS 0.0 08/11/2020 1654    CMP     Component  Value Date/Time   NA 137 09/13/2022 0917   NA 140 08/11/2020 1654   K 4.0 09/13/2022 0917   CL 107 09/13/2022 0917   CO2 23 09/13/2022 0917   GLUCOSE 92 09/13/2022 0917   BUN 13 09/13/2022 0917   BUN  12 08/11/2020 1654   CREATININE 0.64 09/13/2022 0917   CREATININE 0.78 09/29/2015 1621   CALCIUM 8.5 (L) 09/13/2022 0917   PROT 7.1 09/13/2022 0917   PROT 8.0 08/11/2020 1654   ALBUMIN 3.7 09/13/2022 0917   ALBUMIN 4.2 08/11/2020 1654   AST 12 (L) 09/13/2022 0917   ALT 15 09/13/2022 0917   ALKPHOS 55 09/13/2022 0917   BILITOT 0.3 09/13/2022 0917   GFRNONAA >60 09/13/2022 0917   GFRAA 110 08/11/2020 1654       PENDING LABS:   RADIOGRAPHIC STUDIES:  No results found.   PATHOLOGY:     ASSESSMENT and THERAPY PLAN:   Malignant neoplasm of upper-outer quadrant of right breast in female, estrogen receptor positive (HCC) 02/15/2021:Screening mammogram: a possible mass in the right breast  Diagnostic mammogram and [place]: a persistent irregular mass 1.2 cm within the upper-outer right breast. 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    Recommendation based on multidisciplinary tumor board: 1. Neoadjuvant chemotherapy with Adriamycin and Cytoxan dose dense 4 followed by Taxol weekly 9 (stopped early for AEs)  2. Followed by breast conserving surgery with  targeted axillary dissection: 08/05/2021: Pathologic complete response, 0/3 lymph nodes negative 3. Followed by adjuvant radiation therapy 09/30/2021-11/07/2021 4.  Followed by adjuvant antiestrogen therapy (patient was premenopausal prior to chemo)  ------------------------------------------------------------------------------------------------------------------------------------  Lab review: FSH 12.8 (still premenopausal), estradiol 5.5   Breast cancer surveillance:  Mammogram right breast: 05/05/2022: Benign  Current treatment: Tamoxifen Fatigue and  arthralgias: We will obtain lab testing today to further evaluate.  This is unrelated to tamoxifen since none of it resolved while she was off of the tamoxifen.  I recommended that she resume taking tamoxifen.  We will follow-up with her on her lab results.  She will return in September for follow-up with Dr. Pamelia Hoit.    All questions were answered. The patient knows to call the clinic with any problems, questions or concerns. We can certainly see the patient much sooner if necessary.  Total encounter time:20 minutes*in face-to-face visit time, chart review, lab review, care coordination, order entry, and documentation of the encounter time.    Lillard Anes, NP 11/14/22 3:25 PM Medical Oncology and Hematology [facility] [address] [place], [place] [address] Tel. [phone]    Fax. [phone]  *Total Encounter Time as defined by the Centers for Medicare and Medicaid Services includes, in addition to the face-to-face time of a patient visit (documented in the note above) non-face-to-face time: obtaining and reviewing outside history, ordering and reviewing medications, tests or procedures, care coordination (communications with other health care professionals or caregivers) and documentation in the medical record.

## 2022-11-17 LAB — ANTINUCLEAR ANTIBODIES, IFA: ANA Ab, IFA: NEGATIVE

## 2022-11-17 LAB — ESTRADIOL, ULTRA SENS: Estradiol, Sensitive: 7.8 pg/mL

## 2022-11-17 LAB — FOLLICLE STIMULATING HORMONE: FSH: 12.9 m[IU]/mL

## 2022-11-20 ENCOUNTER — Telehealth: Payer: Self-pay

## 2022-11-20 NOTE — Telephone Encounter (Signed)
Pt advised and agreed to f/up with her PCP

## 2022-11-20 NOTE — Telephone Encounter (Signed)
-----   Message from Loa Socks, NP sent at 11/17/2022  4:16 PM EDT ----- Please tell Sandra Bishop that her labs are stable and don't give me a reason for her profound fatigue and pain.  I recommend that she f/u with her PCP or sports medicine about her bilateral knee pain.   ----- Message ----- From: Leory Plowman, Lab In [place] Sent: 11/14/2022   4:00 PM EDT To: Loa Socks, NP

## 2023-02-08 LAB — MICROALBUMIN, URINE: Microalb, Ur: 0.7

## 2023-02-08 LAB — PROTEIN / CREATININE RATIO, URINE: Creatinine, Urine: 75

## 2023-02-08 LAB — MICROALBUMIN / CREATININE URINE RATIO: Microalb Creat Ratio: 9.3

## 2023-03-13 ENCOUNTER — Other Ambulatory Visit: Payer: Self-pay | Admitting: Adult Health

## 2023-03-13 DIAGNOSIS — Z9889 Other specified postprocedural states: Secondary | ICD-10-CM

## 2023-03-28 ENCOUNTER — Inpatient Hospital Stay: Payer: BC Managed Care – PPO | Attending: Hematology and Oncology

## 2023-03-28 DIAGNOSIS — C773 Secondary and unspecified malignant neoplasm of axilla and upper limb lymph nodes: Secondary | ICD-10-CM | POA: Diagnosis not present

## 2023-03-28 DIAGNOSIS — C50411 Malignant neoplasm of upper-outer quadrant of right female breast: Secondary | ICD-10-CM | POA: Diagnosis present

## 2023-03-28 DIAGNOSIS — Z170 Estrogen receptor positive status [ER+]: Secondary | ICD-10-CM | POA: Insufficient documentation

## 2023-03-28 DIAGNOSIS — Z79810 Long term (current) use of selective estrogen receptor modulators (SERMs): Secondary | ICD-10-CM | POA: Insufficient documentation

## 2023-03-28 LAB — CMP (CANCER CENTER ONLY)
ALT: 13 U/L (ref 0–44)
AST: 11 U/L — ABNORMAL LOW (ref 15–41)
Albumin: 3.7 g/dL (ref 3.5–5.0)
Alkaline Phosphatase: 61 U/L (ref 38–126)
Anion gap: 7 (ref 5–15)
BUN: 15 mg/dL (ref 6–20)
CO2: 26 mmol/L (ref 22–32)
Calcium: 9.5 mg/dL (ref 8.9–10.3)
Chloride: 105 mmol/L (ref 98–111)
Creatinine: 0.8 mg/dL (ref 0.44–1.00)
GFR, Estimated: >60 mL/min (ref >60)
Glucose, Bld: 190 mg/dL — ABNORMAL HIGH (ref 70–99)
Potassium: 4.2 mmol/L (ref 3.5–5.1)
Sodium: 138 mmol/L (ref 135–145)
Total Bilirubin: 0.3 mg/dL (ref 0.3–1.2)
Total Protein: 7.6 g/dL (ref 6.5–8.1)

## 2023-03-28 LAB — CBC WITH DIFFERENTIAL (CANCER CENTER ONLY)
Abs Immature Granulocytes: 0.01 10*3/uL (ref 0.00–0.07)
Basophils Absolute: 0 10*3/uL (ref 0.0–0.1)
Basophils Relative: 0 %
Eosinophils Absolute: 0.1 10*3/uL (ref 0.0–0.5)
Eosinophils Relative: 1 %
HCT: 36.7 % (ref 36.0–46.0)
Hemoglobin: 12.1 g/dL (ref 12.0–15.0)
Immature Granulocytes: 0 %
Lymphocytes Relative: 25 %
Lymphs Abs: 1.2 10*3/uL (ref 0.7–4.0)
MCH: 28.5 pg (ref 26.0–34.0)
MCHC: 33 g/dL (ref 30.0–36.0)
MCV: 86.6 fL (ref 80.0–100.0)
Monocytes Absolute: 0.6 10*3/uL (ref 0.1–1.0)
Monocytes Relative: 12 %
Neutro Abs: 3 10*3/uL (ref 1.7–7.7)
Neutrophils Relative %: 62 %
Platelet Count: 201 10*3/uL (ref 150–400)
RBC: 4.24 MIL/uL (ref 3.87–5.11)
RDW: 14.1 % (ref 11.5–15.5)
WBC Count: 4.8 10*3/uL (ref 4.0–10.5)
nRBC: 0 % (ref 0.0–0.2)

## 2023-04-11 ENCOUNTER — Inpatient Hospital Stay (HOSPITAL_BASED_OUTPATIENT_CLINIC_OR_DEPARTMENT_OTHER): Payer: BC Managed Care – PPO | Admitting: Hematology and Oncology

## 2023-04-11 DIAGNOSIS — Z170 Estrogen receptor positive status [ER+]: Secondary | ICD-10-CM

## 2023-04-11 DIAGNOSIS — C50411 Malignant neoplasm of upper-outer quadrant of right female breast: Secondary | ICD-10-CM | POA: Diagnosis not present

## 2023-04-11 NOTE — Assessment & Plan Note (Addendum)
02/15/2021:Screening mammogram: a possible mass in the right breast  Diagnostic mammogram and [place]: a persistent irregular mass 1.2 cm within the upper-outer right breast. 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    Recommendation based on multidisciplinary tumor board: 1. Neoadjuvant chemotherapy with Adriamycin and Cytoxan dose dense 4 followed by Taxol weekly 9 (stopped early for AEs)  2. Followed by breast conserving surgery with  targeted axillary dissection: 08/05/2021: Pathologic complete response, 0/3 lymph nodes negative 3. Followed by adjuvant radiation therapy 09/30/2021-11/07/2021 4.  Followed by adjuvant antiestrogen therapy (patient was premenopausal prior to chemo)  ------------------------------------------------------------------------------------------------------------------------------------ Tamoxifen toxicities: Other than hot flashes she is tolerating tamoxifen extremely well. I encouraged her to take tamoxifen on a consistent basis at the same time   Abemaciclib cannot be started because she is still premenopausal and requires tamoxifen.   Lab review: 10/09/22: FSH 12.8 (still premenopausal), estradiol 5.5   Breast cancer surveillance:  Mammogram right breast: Scheduled for 04/20/2023    Follow-up once the clinical trial is approved with Ribociclib (WIDER).  Then we will check if she is in menopause to see if she would qualify for the trial at that time.

## 2023-04-11 NOTE — Progress Notes (Signed)
Patient Care Team: Shon Hale, MD as PCP - General (Family Medicine) Almond Lint, MD as Consulting Physician (General Surgery) Serena Croissant, MD as Consulting Physician (Hematology and Oncology) Dorothy Puffer, MD as Consulting Physician (Radiation Oncology)  DIAGNOSIS:  Encounter Diagnosis  Name Primary?   Malignant neoplasm of upper-outer quadrant of right breast in female, estrogen receptor positive (HCC) Yes    SUMMARY OF ONCOLOGIC HISTORY: Oncology History  Malignant neoplasm of upper-outer quadrant of right breast in female, estrogen receptor positive (HCC)  02/15/2021 Initial Diagnosis   Screening mammogram: a possible mass in the right breast  Diagnostic mammogram and [place]: a persistent irregular mass 1.2 cm within the upper-outer right breast. 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    02/23/2021 Cancer Staging   Staging form: Breast, AJCC 8th Edition - Clinical stage from 02/23/2021: Stage IIB (cT1c, cN1(f), cM0, G3, ER+, PR-, HER2-) - Signed by Serena Croissant, MD on 02/23/2021 Stage prefix: Initial diagnosis Method of lymph node assessment: Core biopsy Histologic grading system: 3 grade system   03/04/2021 - 07/01/2021 Neo-Adjuvant Chemotherapy   Received four cycles of neoadjuvant Adriamycin and Cytoxan from 03/04/2021-04/14/2021.  Followed by 9 cycles of Taxol from 04/28/2021-07/01/2021.  Taxol was dose reduced beginning with cycle 6 due to restless legs and stopped after 9 cycles due to peripheral neuropathy.    03/10/2021 Genetic Testing   Negative genetic testing. No pathogenic variants identified on the [facility] CancerNext-Expanded+RNA panel. The report date is 03/10/2021.  The CancerNext-Expanded + RNAinsight gene panel offered by [employer] and includes sequencing and rearrangement analysis for the following 77 genes: IP, ALK, APC*, ATM*, AXIN2, BAP1, BARD1, BLM, BMPR1A, BRCA1*, BRCA2*,  BRIP1*, CDC73, CDH1*,CDK4, CDKN1B, CDKN2A, CHEK2*, CTNNA1, DICER1, FANCC, FH, FLCN, GALNT12, KIF1B, LZTR1, MAX, MEN1, MET, MLH1*, MSH2*, MSH3, MSH6*, MUTYH*, NBN, NF1*, NF2, NTHL1, PALB2*, PHOX2B, PMS2*, POT1, PRKAR1A, PTCH1, PTEN*, RAD51C*, RAD51D*,RB1, RECQL, RET, SDHA, SDHAF2, SDHB, SDHC, SDHD, SMAD4, SMARCA4, SMARCB1, SMARCE1, STK11, SUFU, TMEM127, TP53*,TSC1, TSC2, VHL and XRCC2 (sequencing and deletion/duplication); EGFR, EGLN1, HOXB13, KIT, MITF, PDGFRA, POLD1 and POLE (sequencing only); EPCAM and GREM1 (deletion/duplication only).   09/20/2021 - 11/08/2021 Radiation Therapy   Site Technique Total Dose (Gy) Dose per Fx (Gy) Completed Fx Beam Energies  Breast, Right: Breast_R 3D 50.4/50.4 1.8 28/28 10X  Breast, Right: Breast_R_SCLV 3D 50.4/50.4 1.8 28/28 6X, 10X  Breast, Right: Breast_R_Bst 3D 10/10 2 5/5 6X, 10X     10/31/2021 -  Anti-estrogen oral therapy   Tamoxifen x 2-5 years followed by switching to aromatase inhibitor once menopausal     CHIEF COMPLIANT: Follow-up on tamoxifen therapy     History of Present Illness   The patient, with a history of breast cancer, is currently on tamoxifen therapy. She reports tolerating the medication well with no adverse effects. The patient's last menstrual cycle was in August, prior to the initiation of her cancer treatment. The patient's menopausal status is uncertain, and recent blood work did not include follicle-stimulating hormone [facility]) and estradiol levels, which are necessary to confirm menopause. The patient was also informed about a potential clinical trial that she may be eligible for, which would involve adding another medication to her current tamoxifen therapy. However, the trial has not yet opened.         ALLERGIES:  has No Known Allergies.  MEDICATIONS:  Current Outpatient Medications  Medication Sig Dispense Refill   ascorbic acid (VITAMIN C) 500 MG tablet Take  1 tablet (500 mg total) by mouth daily.     aspirin EC 81 MG  tablet Take 81 mg by mouth daily. Swallow whole.     blood glucose meter kit and supplies Dispense based on patient and insurance preference. Use up to four times daily as directed. (FOR ICD-10 E10.9, E11.9).  ( One-Touch Verio meter) 1 each 0   Cholecalciferol (VITAMIN D3) 50 MCG (2000 UT) TABS Take 2,000 Units by mouth daily.     Lancets (ONETOUCH ULTRASOFT) lancets Use to check blood glucose three times daily. Use as instructed. E11.9. 100 each 12   ONETOUCH VERIO test strip TEST FOUR TIMES DAILY AS DIRECTED 100 strip 2   rosuvastatin (CRESTOR) 5 MG tablet Take 5 mg by mouth every Tuesday.     Semaglutide,0.25 or 0.5MG /DOS, (OZEMPIC, 0.25 OR 0.5 MG/DOSE,) 2 MG/3ML SOPN Inject 0.25-0.5 mg into the skin once a week.     tamoxifen (NOLVADEX) 20 MG tablet TAKE 1 TABLET(20 MG) BY MOUTH DAILY 90 tablet 3   No current facility-administered medications for this visit.    PHYSICAL EXAMINATION: ECOG PERFORMANCE STATUS: 1 - Symptomatic but completely ambulatory    LABORATORY DATA:  I have reviewed the data as listed    Latest Ref Rng & Units 03/28/2023    9:15 AM 11/14/2022    3:42 PM 09/13/2022    9:17 AM  CMP  Glucose 70 - 99 mg/dL 096  045  92   BUN 6 - 20 mg/dL 15  14  13    Creatinine 0.44 - 1.00 mg/dL 4.09  8.11  9.14   Sodium 135 - 145 mmol/L 138  139  137   Potassium 3.5 - 5.1 mmol/L 4.2  3.7  4.0   Chloride 98 - 111 mmol/L 105  106  107   CO2 22 - 32 mmol/L 26  27  23    Calcium 8.9 - 10.3 mg/dL 9.5  9.5  8.5   Total Protein 6.5 - 8.1 g/dL 7.6  7.5  7.1   Total Bilirubin 0.3 - 1.2 mg/dL 0.3  0.2  0.3   Alkaline Phos 38 - 126 U/L 61  57  55   AST 15 - 41 U/L 11  12  12    ALT 0 - 44 U/L 13  13  15      Lab Results  Component Value Date   WBC 4.8 03/28/2023   HGB 12.1 03/28/2023   HCT 36.7 03/28/2023   MCV 86.6 03/28/2023   PLT 201 03/28/2023   NEUTROABS 3.0 03/28/2023    ASSESSMENT & PLAN:  Malignant neoplasm of upper-outer quadrant of right breast in female, estrogen  receptor positive (HCC) 02/15/2021:Screening mammogram: a possible mass in the right breast  Diagnostic mammogram and [place]: a persistent irregular mass 1.2 cm within the upper-outer right breast. 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    Recommendation based on multidisciplinary tumor board: 1. Neoadjuvant chemotherapy with Adriamycin and Cytoxan dose dense 4 followed by Taxol weekly 9 (stopped early for AEs)  2. Followed by breast conserving surgery with  targeted axillary dissection: 08/05/2021: Pathologic complete response, 0/3 lymph nodes negative 3. Followed by adjuvant radiation therapy 09/30/2021-11/07/2021 4.  Followed by adjuvant antiestrogen therapy (patient was premenopausal prior to chemo)  ------------------------------------------------------------------------------------------------------------------------------------ Tamoxifen toxicities: Other than hot flashes she is tolerating tamoxifen extremely well. I encouraged her to take tamoxifen on a consistent basis at the same time   Abemaciclib  cannot be started because she is still premenopausal and requires tamoxifen.   Lab review: 10/09/22: FSH 12.8 (still premenopausal), estradiol 5.5   Breast cancer surveillance:  Mammogram right breast: Scheduled for 04/20/2023    Follow-up once the clinical trial is approved with Ribociclib (WIDER).  Then we will check if she is in menopause to see if she would qualify for the trial at that time.     Return to clinic in 1 year for follow-up    No orders of the defined types were placed in this encounter.  The patient has a good understanding of the overall plan. she agrees with it. she will call with any problems that may develop before the next visit here. Total time spent: 30 mins including face to face time and time spent for planning, charting and co-ordination of care   Tamsen Meek,  MD 04/11/23

## 2023-04-23 ENCOUNTER — Ambulatory Visit
Admission: RE | Admit: 2023-04-23 | Discharge: 2023-04-23 | Disposition: A | Discharge status: Home / Self Care | Payer: BC Managed Care – PPO | Source: Ambulatory Visit | Attending: Adult Health | Admitting: Adult Health

## 2023-04-23 DIAGNOSIS — Z9889 Other specified postprocedural states: Secondary | ICD-10-CM

## 2023-05-31 ENCOUNTER — Telehealth: Payer: Self-pay | Admitting: *Deleted

## 2023-05-31 NOTE — Telephone Encounter (Signed)
Received call from pt with complaint of swelling of right breast and foul odor coming from the breast.  Pt denies recent injury or trauma.  Soonest appt with our office is 06/12/23.  RN placed call to [facility] and scheduled pt to f/u with Dr. Donell Beers 06/01/23 at 1015 am.  Pt educated and verbalized understanding.

## 2023-07-26 LAB — LAB REPORT - SCANNED
A1c: 9.4
Chlamydia, Swab/Urine, PCR: NOT DETECTED
Creatinine, POC: 75 mg/dL
EGFR (African American): 100
HM HIV Screening: NEGATIVE
Microalb Creat Ratio: <9.3
Microalbumin, Urine: <0.7
TSH: 0.81

## 2023-07-28 ENCOUNTER — Other Ambulatory Visit: Payer: Self-pay | Admitting: Hematology and Oncology

## 2023-09-10 ENCOUNTER — Other Ambulatory Visit: Payer: Self-pay

## 2023-09-10 ENCOUNTER — Emergency Department (HOSPITAL_BASED_OUTPATIENT_CLINIC_OR_DEPARTMENT_OTHER): Payer: Self-pay | Admitting: Radiology

## 2023-09-10 ENCOUNTER — Encounter (HOSPITAL_BASED_OUTPATIENT_CLINIC_OR_DEPARTMENT_OTHER): Payer: Self-pay | Admitting: *Deleted

## 2023-09-10 ENCOUNTER — Emergency Department (HOSPITAL_BASED_OUTPATIENT_CLINIC_OR_DEPARTMENT_OTHER)
Admission: EM | Admit: 2023-09-10 | Discharge: 2023-09-10 | Disposition: A | Discharge status: Home / Self Care | Payer: Self-pay | Attending: Emergency Medicine | Admitting: Emergency Medicine

## 2023-09-10 DIAGNOSIS — Z5329 Procedure and treatment not carried out because of patient's decision for other reasons: Secondary | ICD-10-CM | POA: Insufficient documentation

## 2023-09-10 DIAGNOSIS — M25552 Pain in left hip: Secondary | ICD-10-CM | POA: Insufficient documentation

## 2023-09-10 DIAGNOSIS — E119 Type 2 diabetes mellitus without complications: Secondary | ICD-10-CM | POA: Diagnosis not present

## 2023-09-10 DIAGNOSIS — M25512 Pain in left shoulder: Secondary | ICD-10-CM | POA: Insufficient documentation

## 2023-09-10 DIAGNOSIS — Z853 Personal history of malignant neoplasm of breast: Secondary | ICD-10-CM | POA: Insufficient documentation

## 2023-09-10 DIAGNOSIS — Z7982 Long term (current) use of aspirin: Secondary | ICD-10-CM | POA: Insufficient documentation

## 2023-09-10 DIAGNOSIS — Y92410 Unspecified street and highway as the place of occurrence of the external cause: Secondary | ICD-10-CM | POA: Insufficient documentation

## 2023-09-10 MED ORDER — KETOROLAC TROMETHAMINE 15 MG/ML IJ SOLN
15.0000 mg | Freq: Once | INTRAMUSCULAR | Status: AC
  Administered 2023-09-10: 15 mg via INTRAMUSCULAR
  Filled 2023-09-10: qty 1

## 2023-09-10 MED ORDER — DIAZEPAM 2 MG PO TABS
2.0000 mg | ORAL_TABLET | Freq: Once | ORAL | Status: DC
  Filled 2023-09-10: qty 1

## 2023-09-10 NOTE — ED Notes (Addendum)
 Pt left AMA... Pt aware of the risk of leaving without completing testing... Provider aware and explained AMA information... Pt was Ao x4 and able to ambulate out of the ED.Marland KitchenMarland Kitchen
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
## 2023-09-10 NOTE — ED Notes (Signed)
 Pt aware of the need for a urine... Unable to currently provide the sample.Marland KitchenMarland Kitchen

## 2023-11-29 ENCOUNTER — Telehealth: Payer: Self-pay | Admitting: *Deleted

## 2023-11-29 NOTE — Telephone Encounter (Signed)
 Received call from pt stating she is experiencing right sided breast pain x several weeks.  Pt states her son passed away in June 15, 2023 and she stopped taking all her medications due to depression.  Pt states she has restarted all her medications and is in a better spot mentally but is now concerned for this right sided breast pain and requesting breast exam.  [facility] visit scheduled, pt verbalized understanding of appt details.

## 2023-12-05 ENCOUNTER — Encounter: Payer: Self-pay | Admitting: Adult Health

## 2023-12-05 ENCOUNTER — Inpatient Hospital Stay: Attending: Adult Health | Admitting: Adult Health

## 2023-12-05 ENCOUNTER — Telehealth: Admitting: Hematology and Oncology

## 2023-12-05 ENCOUNTER — Inpatient Hospital Stay

## 2023-12-05 VITALS — BP 144/83 | HR 100 | Temp 97.9°F | Resp 17 | Ht 64.0 in | Wt 211.2 lb

## 2023-12-05 DIAGNOSIS — Z79810 Long term (current) use of selective estrogen receptor modulators (SERMs): Secondary | ICD-10-CM | POA: Insufficient documentation

## 2023-12-05 DIAGNOSIS — C773 Secondary and unspecified malignant neoplasm of axilla and upper limb lymph nodes: Secondary | ICD-10-CM | POA: Insufficient documentation

## 2023-12-05 DIAGNOSIS — Z170 Estrogen receptor positive status [ER+]: Secondary | ICD-10-CM | POA: Diagnosis not present

## 2023-12-05 DIAGNOSIS — C50411 Malignant neoplasm of upper-outer quadrant of right female breast: Secondary | ICD-10-CM | POA: Insufficient documentation

## 2023-12-05 LAB — CBC WITH DIFFERENTIAL (CANCER CENTER ONLY)
Abs Immature Granulocytes: 0.03 10*3/uL (ref 0.00–0.07)
Basophils Absolute: 0 10*3/uL (ref 0.0–0.1)
Basophils Relative: 0 %
Eosinophils Absolute: 0 10*3/uL (ref 0.0–0.5)
Eosinophils Relative: 1 %
HCT: 36.3 % (ref 36.0–46.0)
Hemoglobin: 11.9 g/dL — ABNORMAL LOW (ref 12.0–15.0)
Immature Granulocytes: 0 %
Lymphocytes Relative: 22 %
Lymphs Abs: 1.5 10*3/uL (ref 0.7–4.0)
MCH: 28 pg (ref 26.0–34.0)
MCHC: 32.8 g/dL (ref 30.0–36.0)
MCV: 85.4 fL (ref 80.0–100.0)
Monocytes Absolute: 0.7 10*3/uL (ref 0.1–1.0)
Monocytes Relative: 10 %
Neutro Abs: 4.5 10*3/uL (ref 1.7–7.7)
Neutrophils Relative %: 67 %
Platelet Count: 229 10*3/uL (ref 150–400)
RBC: 4.25 MIL/uL (ref 3.87–5.11)
RDW: 13.9 % (ref 11.5–15.5)
WBC Count: 6.8 10*3/uL (ref 4.0–10.5)
nRBC: 0 % (ref 0.0–0.2)

## 2023-12-05 LAB — CMP (CANCER CENTER ONLY)
ALT: 16 U/L (ref 0–44)
AST: 13 U/L — ABNORMAL LOW (ref 15–41)
Albumin: 4.1 g/dL (ref 3.5–5.0)
Alkaline Phosphatase: 68 U/L (ref 38–126)
Anion gap: 5 (ref 5–15)
BUN: 11 mg/dL (ref 6–20)
CO2: 26 mmol/L (ref 22–32)
Calcium: 9.3 mg/dL (ref 8.9–10.3)
Chloride: 106 mmol/L (ref 98–111)
Creatinine: 0.8 mg/dL (ref 0.44–1.00)
GFR, Estimated: >60 mL/min (ref >60)
Glucose, Bld: 212 mg/dL — ABNORMAL HIGH (ref 70–99)
Potassium: 3.9 mmol/L (ref 3.5–5.1)
Sodium: 137 mmol/L (ref 135–145)
Total Bilirubin: 0.3 mg/dL (ref 0.0–1.2)
Total Protein: 8.1 g/dL (ref 6.5–8.1)

## 2023-12-05 LAB — MAGNESIUM: Magnesium: 2.1 mg/dL (ref 1.7–2.4)

## 2023-12-05 NOTE — Progress Notes (Signed)
 [facility] Cancer Follow up:    Ransom Byers, MD [address] [place] [address]   DIAGNOSIS:  Cancer Staging  Malignant neoplasm of upper-outer quadrant of right breast in female, estrogen receptor positive (HCC) Staging form: Breast, AJCC 8th Edition - Clinical stage from 02/23/2021: Stage IIB (cT1c, cN1(f), cM0, G3, ER+, PR-, HER2-) - Signed by Cameron Cea, MD on 02/23/2021 Stage prefix: Initial diagnosis Method of lymph node assessment: Core biopsy Histologic grading system: 3 grade system    SUMMARY OF ONCOLOGIC HISTORY: Oncology History  Malignant neoplasm of upper-outer quadrant of right breast in female, estrogen receptor positive (HCC)  02/15/2021 Initial Diagnosis   Screening mammogram: a possible mass in the right breast  Diagnostic mammogram and US : a persistent irregular mass 1.2 cm within the upper-outer right breast. 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    02/23/2021 Cancer Staging   Staging form: Breast, AJCC 8th Edition - Clinical stage from 02/23/2021: Stage IIB (cT1c, cN1(f), cM0, G3, ER+, PR-, HER2-) - Signed by Cameron Cea, MD on 02/23/2021 Stage prefix: Initial diagnosis Method of lymph node assessment: Core biopsy Histologic grading system: 3 grade system   03/04/2021 - 07/01/2021 Neo-Adjuvant Chemotherapy   Received four cycles of neoadjuvant Adriamycin  and Cytoxan  from 03/04/2021-04/14/2021.  Followed by 9 cycles of Taxol  from 04/28/2021-07/01/2021.  Taxol  was dose reduced beginning with cycle 6 due to restless legs and stopped after 9 cycles due to peripheral neuropathy.    03/10/2021 Genetic Testing   Negative genetic testing. No pathogenic variants identified on the [facility] CancerNext-Expanded+RNA panel. The report date is 03/10/2021.  The CancerNext-Expanded + RNAinsight gene panel offered by [employer] and includes sequencing and  rearrangement analysis for the following 77 genes: IP, ALK, APC*, ATM*, AXIN2, BAP1, BARD1, BLM, BMPR1A, BRCA1*, BRCA2*, BRIP1*, CDC73, CDH1*,CDK4, CDKN1B, CDKN2A, CHEK2*, CTNNA1, DICER1, FANCC, FH, FLCN, GALNT12, KIF1B, LZTR1, MAX, MEN1, MET, MLH1*, MSH2*, MSH3, MSH6*, MUTYH*, NBN, NF1*, NF2, NTHL1, PALB2*, PHOX2B, PMS2*, POT1, PRKAR1A, PTCH1, PTEN*, RAD51C*, RAD51D*,RB1, RECQL, RET, SDHA, SDHAF2, SDHB, SDHC, SDHD, SMAD4, SMARCA4, SMARCB1, SMARCE1, STK11, SUFU, TMEM127, TP53*,TSC1, TSC2, VHL and XRCC2 (sequencing and deletion/duplication); EGFR, EGLN1, HOXB13, KIT, MITF, PDGFRA, POLD1 and POLE (sequencing only); EPCAM and GREM1 (deletion/duplication only).   09/20/2021 - 11/08/2021 Radiation Therapy   Site Technique Total Dose (Gy) Dose per Fx (Gy) Completed Fx Beam Energies  Breast, Right: Breast_R 3D 50.4/50.4 1.8 28/28 10X  Breast, Right: Breast_R_SCLV 3D 50.4/50.4 1.8 28/28 6X, 10X  Breast, Right: Breast_R_Bst 3D 10/10 2 5/5 6X, 10X     10/31/2021 -  Anti-estrogen oral therapy   Tamoxifen  x 2-5 years followed by switching to aromatase inhibitor once menopausal     CURRENT THERAPY: Tamoxifen   INTERVAL HISTORY:  Sandra Bishop 53 y.o. female returns for follow-up of her breast cancer on tamoxifen .  Recent mammogram occurred on April 23, 2023 demonstrating no mammographic evidence of malignancy and breast density category B.  She is noting an increase in breast pain.  She had temporarily been off tamoxifen  following the death of her son unexpectedly this past November.  She also has missed several primary care appointments related to this.   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 07/22/2021   Hyperglycemia due to type 2 diabetes mellitus (HCC) 07/08/2021   Pure hypercholesterolemia 07/08/2021   Morbid obesity (HCC) 07/08/2021   Chemotherapy-induced peripheral  neuropathy (HCC) 07/08/2021   Genetic testing 03/17/2021    Family history of breast cancer 02/24/2021   Family history of pancreatic cancer 02/24/2021   Family history of colon cancer 02/24/2021   Family history of lung cancer 02/24/2021   Malignant neoplasm of upper-outer quadrant of right breast in female, estrogen receptor positive (HCC) 02/15/2021   Obesity    Constipation 08/30/2011    has no known allergies.  MEDICAL HISTORY: Past Medical History:  Diagnosis Date   Allergy    Anemia    Breast cancer (HCC)    Right   Constipation    Diabetes mellitus without complication (HCC)    Family history of adverse reaction to anesthesia    Aunt is hard to wake up from general anesthesia   Family history of breast cancer    Family history of colon cancer    Family history of lung cancer    Family history of pancreatic cancer    History of migraine    during pregancy   Obesity    Palpitations     SURGICAL HISTORY: Past Surgical History:  Procedure Laterality Date   BREAST BIOPSY Right 02/11/2021   x2   BREAST BIOPSY Right 03/22/2021   x3   BREAST LUMPECTOMY WITH RADIOACTIVE SEED AND SENTINEL LYMPH NODE BIOPSY Right 08/17/2021   Procedure: RIGHT BREAST LUMPECTOMY WITH RADIOACTIVE SEED AND SENTINEL LYMPH NODE BIOPSY;  Surgeon: Lockie Rima, MD;  Location: [facility] OR;  Service: General;  Laterality: Right;   EYE SURGERY Right    PORT-A-CATH REMOVAL N/A 08/17/2021   Procedure: REMOVAL PORT-A-CATH;  Surgeon: Lockie Rima, MD;  Location: [facility] OR;  Service: General;  Laterality: N/A;   PORTACATH PLACEMENT N/A 03/02/2021   Procedure: INSERTION PORT-A-CATH;  Surgeon: Lockie Rima, MD;  Location: [facility] ORS;  Service: General;  Laterality: N/A;   RADIOACTIVE SEED GUIDED AXILLARY SENTINEL LYMPH NODE Right 08/17/2021   Procedure: RADIOACTIVE SEED GUIDED RIGHT SENTINEL LYMPH NODE EXCISION;  Surgeon: Lockie Rima, MD;  Location: [facility] OR;  Service: General;  Laterality: Right;   RADIOACTIVE SEED GUIDED EXCISIONAL BREAST BIOPSY Left 08/17/2021   Procedure:  RADIOACTIVE SEED GUIDED EXCISIONAL LEFT BREAST BIOPSY;  Surgeon: Lockie Rima, MD;  Location: [facility] OR;  Service: General;  Laterality: Left;   TUBAL LIGATION      SOCIAL HISTORY: Social History   Socioeconomic History   Marital status: Divorced    Spouse name: Not on file   Number of children: 2   Years of education: Not on file   Highest education level: Not on file  Occupational History   Not on file  Tobacco Use   Smoking status: Former    Current packs/day: 0.00    Types: Cigarettes    Quit date: 12/28/2010    Years since quitting: 12.9   Smokeless tobacco: Never   Tobacco comments:    quit in 2012, previously smoked 15 years  Vaping Use   Vaping status: Not on file  Substance and Sexual Activity   Alcohol use: Not Currently    Comment: occ   Drug use: No   Sexual activity: Yes    Birth control/protection: Surgical  Other Topics Concern   Not on file  Social History Narrative   Not on file   Social Drivers of Health   Financial Resource Strain: Not on file  Food Insecurity: Not on file  Transportation Needs: Not on file  Physical Activity: Not on file  Stress: Not on file  Social Connections: Not on file  Intimate Partner Violence: Not on file    FAMILY HISTORY: Family History  Problem Relation Age of Onset   Hypertension Mother    Pancreatic cancer Father 52   Breast cancer Half-Sister 107       negative genetic testing   Colon cancer Maternal Uncle        dx >50   Lung cancer Maternal Uncle        dx 74s, hx smoking   Cancer Paternal Aunt        unknown type, dx early 67s   Heart failure Maternal Grandmother    Breast cancer Other 14       mother's first cousin    Review of Systems  Constitutional:  Negative for appetite change, chills, fatigue, fever and unexpected weight change.  HENT:   Negative for hearing loss, lump/mass and trouble swallowing.   Eyes:  Negative for eye problems and icterus.  Respiratory:  Negative for chest tightness,  cough and shortness of breath.   Cardiovascular:  Negative for chest pain, leg swelling and palpitations.  Gastrointestinal:  Negative for abdominal distention, abdominal pain, constipation, diarrhea, nausea and vomiting.  Endocrine: Negative for hot flashes.  Genitourinary:  Negative for difficulty urinating.   Musculoskeletal:  Negative for arthralgias.  Skin:  Negative for itching and rash.  Neurological:  Negative for dizziness, extremity weakness, headaches and numbness.  Hematological:  Negative for adenopathy. Does not bruise/bleed easily.  Psychiatric/Behavioral:  Negative for depression. The patient is not nervous/anxious.       PHYSICAL EXAMINATION    Vitals:   12/05/23 1034  BP: (!) 144/83  Pulse: 100  Resp: 17  Temp: 97.9 F (36.6 C)  SpO2: 99%    Physical Exam Constitutional:      General: She is not in acute distress.    Appearance: Normal appearance. She is not toxic-appearing.  HENT:     Head: Normocephalic and atraumatic.     Mouth/Throat:     Mouth: Mucous membranes are moist.     Pharynx: Oropharynx is clear. No oropharyngeal exudate or posterior oropharyngeal erythema.  Eyes:     General: No scleral icterus. Cardiovascular:     Rate and Rhythm: Normal rate and regular rhythm.     Pulses: Normal pulses.     Heart sounds: Normal heart sounds.  Pulmonary:     Effort: Pulmonary effort is normal.     Breath sounds: Normal breath sounds.  Chest:     Comments: Right breast status postlumpectomy and radiation, no sign of local recurrence left breast is benign. Abdominal:     General: Abdomen is flat. Bowel sounds are normal. There is no distension.     Palpations: Abdomen is soft.     Tenderness: There is no abdominal tenderness.  Musculoskeletal:        General: No swelling.     Cervical back: Neck supple.  Lymphadenopathy:     Cervical: No cervical adenopathy.     Upper Body:     Right upper body: No supraclavicular or axillary adenopathy.      Left upper body: No supraclavicular or axillary adenopathy.  Skin:    General: Skin is warm and dry.     Findings: No rash.  Neurological:     General: No focal deficit present.     Mental Status: She is alert.  Psychiatric:        Mood and Affect: Mood normal.        Behavior: Behavior normal.  LABORATORY DATA:  CBC    Component Value Date/Time   WBC 6.8 12/05/2023 1130   WBC 7.2 08/12/2021 0956   RBC 4.25 12/05/2023 1130   HGB 11.9 (L) 12/05/2023 1130   HGB 11.5 08/11/2020 1654   HCT 36.3 12/05/2023 1130   HCT 35.9 08/11/2020 1654   PLT 229 12/05/2023 1130   PLT 330 10/06/2019 1516   MCV 85.4 12/05/2023 1130   MCV 81 08/11/2020 1654   MCH 28.0 12/05/2023 1130   MCHC 32.8 12/05/2023 1130   RDW 13.9 12/05/2023 1130   RDW 14.1 08/11/2020 1654   LYMPHSABS 1.5 12/05/2023 1130   LYMPHSABS 2.9 08/11/2020 1654   MONOABS 0.7 12/05/2023 1130   EOSABS 0.0 12/05/2023 1130   EOSABS 0.2 08/11/2020 1654   BASOSABS 0.0 12/05/2023 1130   BASOSABS 0.0 08/11/2020 1654    CMP     Component Value Date/Time   NA 137 12/05/2023 1130   NA 140 08/11/2020 1654   K 3.9 12/05/2023 1130   CL 106 12/05/2023 1130   CO2 26 12/05/2023 1130   GLUCOSE 212 (H) 12/05/2023 1130   BUN 11 12/05/2023 1130   BUN 12 08/11/2020 1654   CREATININE 0.80 12/05/2023 1130   CREATININE 0.78 09/29/2015 1621   CALCIUM  9.3 12/05/2023 1130   PROT 8.1 12/05/2023 1130   PROT 8.0 08/11/2020 1654   ALBUMIN 4.1 12/05/2023 1130   ALBUMIN 4.2 08/11/2020 1654   AST 13 (L) 12/05/2023 1130   ALT 16 12/05/2023 1130   ALKPHOS 68 12/05/2023 1130   BILITOT 0.3 12/05/2023 1130   GFRNONAA >60 12/05/2023 1130   GFRAA 110 08/11/2020 1654     ASSESSMENT and THERAPY PLAN:   Malignant neoplasm of upper-outer quadrant of right breast in female, estrogen receptor positive (HCC) Terrance is a 53 year old woman with history of 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  Stage IIb estrogen positive breast cancer: She has restarted tamoxifen .  I recommended continued annual mammograms.  We also discussed guardant reveal blood testing. Breast pain: I placed orders for right breast mammogram and ultrasound to further evaluate. Loss follow-up with primary care: I placed some lab orders.  I reached out to my colleagues in [facility] to see if they have availability to get her in.  She has follow-up with Dr. Godina in September.  She knows to call if she has any issues between now and then and we will see her sooner if her mammogram results indicate a need to.   All questions were answered. The patient knows to call the clinic with any problems, questions or concerns. We can certainly see the patient much sooner if necessary.  Total encounter time:30 minutes*in face-to-face visit time, chart review, lab review, care coordination, order entry, and documentation of the encounter time.    Alwin Baars, NP 12/07/23 5:25 PM Medical Oncology and Hematology [facility] [address] [place], [place] [address] Tel. [phone]    Fax. [phone]  *Total Encounter Time as defined by the Centers for Medicare and Medicaid Services includes, in addition to the face-to-face time of a patient visit (documented in the note above) non-face-to-face time: obtaining and reviewing outside history, ordering and reviewing medications, tests or procedures, care coordination (communications with other health care professionals or caregivers) and documentation in the medical record.

## 2023-12-06 ENCOUNTER — Other Ambulatory Visit: Payer: Self-pay | Admitting: *Deleted

## 2023-12-06 DIAGNOSIS — Z170 Estrogen receptor positive status [ER+]: Secondary | ICD-10-CM

## 2023-12-06 LAB — HEMOGLOBIN A1C
Hgb A1c MFr Bld: 10.1 % — ABNORMAL HIGH (ref 4.8–5.6)
Mean Plasma Glucose: 243.17 mg/dL

## 2023-12-07 NOTE — Assessment & Plan Note (Signed)
 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  Stage IIb estrogen positive breast cancer: She has restarted tamoxifen .  I recommended continued annual mammograms.  We also discussed guardant reveal blood testing. Breast pain: I placed orders for right breast mammogram and ultrasound to further evaluate. Loss follow-up with primary care: I placed some lab orders.  I reached out to my colleagues in [facility] to see if they have availability to get her in.  She has follow-up with Dr. Godina in September.  She knows to call if she has any issues between now and then and we will see her sooner if her mammogram results indicate a need to.

## 2023-12-11 ENCOUNTER — Ambulatory Visit: Payer: Self-pay | Admitting: *Deleted

## 2023-12-20 ENCOUNTER — Ambulatory Visit: Admission: RE | Admit: 2023-12-20 | Source: Ambulatory Visit

## 2023-12-20 ENCOUNTER — Ambulatory Visit
Admission: RE | Admit: 2023-12-20 | Discharge: 2023-12-20 | Disposition: A | Discharge status: Home / Self Care | Source: Ambulatory Visit | Attending: Adult Health | Admitting: Adult Health

## 2023-12-20 DIAGNOSIS — Z170 Estrogen receptor positive status [ER+]: Secondary | ICD-10-CM

## 2023-12-21 ENCOUNTER — Encounter: Payer: Self-pay | Admitting: *Deleted

## 2023-12-21 ENCOUNTER — Encounter: Payer: Self-pay | Admitting: Student

## 2023-12-21 ENCOUNTER — Ambulatory Visit: Admitting: Student

## 2023-12-21 VITALS — BP 122/76 | HR 80 | Temp 98.4°F | Resp 12 | Ht 64.0 in | Wt 212.4 lb

## 2023-12-21 DIAGNOSIS — Z7689 Persons encountering health services in other specified circumstances: Secondary | ICD-10-CM

## 2023-12-21 DIAGNOSIS — Z7984 Long term (current) use of oral hypoglycemic drugs: Secondary | ICD-10-CM

## 2023-12-21 DIAGNOSIS — E1165 Type 2 diabetes mellitus with hyperglycemia: Secondary | ICD-10-CM | POA: Diagnosis not present

## 2023-12-21 DIAGNOSIS — E119 Type 2 diabetes mellitus without complications: Secondary | ICD-10-CM | POA: Insufficient documentation

## 2023-12-21 MED ORDER — GLIPIZIDE 10 MG PO TABS: 10.0000 mg | ORAL_TABLET | Freq: Every day | ORAL | 2 refills | Status: DC

## 2023-12-21 MED ORDER — EMPAGLIFLOZIN 25 MG PO TABS: 25.0000 mg | ORAL_TABLET | Freq: Every day | ORAL | 2 refills | Status: DC

## 2023-12-21 NOTE — Progress Notes (Signed)
 Subjective:     Patient ID: Sandra Bishop, female    DOB: [birth year], 53 y.o.   MRN: [id number]  Chief Complaint  Patient presents with   Establish Care   Diabetes    HPI Sandra Bishop is a 53 yo female presents to establish care.  Please seen by Dr. Donia Furlough PCP [facility].  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    Recently engaged.  Lives alone, fianc lives in [place] . Patient has 1 living son 46 year old named Sandra Bishop. The patient reports the recent loss of her  youngest son- 23yo son to an OD in November 2024.  She reports experiencing significant emotional difficulty due to the recent loss of her son and concurrent breast cancer treatment. She acknowledges having struggled to prioritize her health during this time but expresses a desire to regain control of her well-being and re-engage in her health care.  DM Type 2 That she was not taking any DM medications from Nov 2024- April 2025 BS have been in the 150s to 250s Reports she restarted her Jardiance 25 mg daily about 3 weeks ago.  Patient also states she was  on glipizide 10 mg, but she had stopped taking.  Lab Results  Component Value Date   HGBA1C 10.1 (H) 12/05/2023    Allergies: NKA  Patient denies fever, chills, SOB, CP, palpitations, dyspnea, edema, HA, vision changes, N/V/D, abdominal pain, urinary symptoms, rash, weight changes, and recent illness or hospitalizations.     History of Present Illness              Health Maintenance Due  Topic Date Due   Diabetic kidney evaluation - Urine ACR  Never done   Hepatitis C Screening  Never done   Pneumococcal Vaccine 30-34 Years old (1 of 2 - PCV) Never done   Colonoscopy  Never done   FOOT EXAM  03/04/2020   OPHTHALMOLOGY EXAM  03/10/2020   Zoster Vaccines- Shingrix (2 of 2) 04/10/2022    Past Medical History:  Diagnosis Date   Allergy    Anemia     Arthritis    Breast cancer (HCC)    Right   Constipation    Depression 12-Jun-2023   Son passed away   Diabetes mellitus without complication (HCC)    Family history of adverse reaction to anesthesia    Aunt is hard to wake up from general anesthesia   Family history of breast cancer    Family history of colon cancer    Family history of lung cancer    Family history of pancreatic cancer    History of migraine    during pregancy   Obesity    Palpitations     Past Surgical History:  Procedure Laterality Date   BREAST BIOPSY Right 02/11/2021   x2   BREAST BIOPSY Right 03/22/2021   x3   BREAST EXCISIONAL BIOPSY Left 02/2022   BREAST LUMPECTOMY Right 02/2022   BREAST LUMPECTOMY WITH RADIOACTIVE SEED AND SENTINEL LYMPH NODE BIOPSY Right 08/17/2021   Procedure: RIGHT BREAST LUMPECTOMY WITH RADIOACTIVE SEED AND SENTINEL LYMPH NODE BIOPSY;  Surgeon: Lockie Rima, MD;  Location: [facility] OR;  Service: General;  Laterality: Right;   EYE SURGERY Right    PORT-A-CATH REMOVAL N/A 08/17/2021   Procedure: REMOVAL PORT-A-CATH;  Surgeon: Lockie Rima, MD;  Location: [facility] OR;  Service: General;  Laterality: N/A;   PORTACATH PLACEMENT  N/A 03/02/2021   Procedure: INSERTION PORT-A-CATH;  Surgeon: Lockie Rima, MD;  Location: [facility] ORS;  Service: General;  Laterality: N/A;   RADIOACTIVE SEED GUIDED AXILLARY SENTINEL LYMPH NODE Right 08/17/2021   Procedure: RADIOACTIVE SEED GUIDED RIGHT SENTINEL LYMPH NODE EXCISION;  Surgeon: Lockie Rima, MD;  Location: [facility] OR;  Service: General;  Laterality: Right;   RADIOACTIVE SEED GUIDED EXCISIONAL BREAST BIOPSY Left 08/17/2021   Procedure: RADIOACTIVE SEED GUIDED EXCISIONAL LEFT BREAST BIOPSY;  Surgeon: Lockie Rima, MD;  Location: [facility] OR;  Service: General;  Laterality: Left;   TUBAL LIGATION      Family History  Problem Relation Age of Onset   Hypertension Mother    Pancreatic cancer Father 68   Breast cancer Half-Sister 31       negative genetic testing   Colon  cancer Maternal Uncle        dx >50   Lung cancer Maternal Uncle        dx 48s, hx smoking   Cancer Paternal Aunt        unknown type, dx early 27s   Heart failure Maternal Grandmother    Breast cancer Other 51       mother's first cousin   Cancer Sister     Social History   Socioeconomic History   Marital status: Divorced    Spouse name: Not on file   Number of children: 2   Years of education: Not on file   Highest education level: Associate degree: occupational, Scientist, product/process development, or vocational program  Occupational History   Not on file  Tobacco Use   Smoking status: Former    Current packs/day: 0.00    Types: Cigarettes    Quit date: 12/28/2010    Years since quitting: 12.9   Smokeless tobacco: Never   Tobacco comments:    quit in 2012, previously smoked 15 years  Vaping Use   Vaping status: Not on file  Substance and Sexual Activity   Alcohol use: Not Currently    Comment: occ   Drug use: No   Sexual activity: Yes    Birth control/protection: Surgical  Other Topics Concern   Not on file  Social History Narrative   Not on file   Social Drivers of Health   Financial Resource Strain: Low Risk  (12/16/2023)   Overall Financial Resource Strain (CARDIA)    Difficulty of Paying Living Expenses: Not very hard  Food Insecurity: No Food Insecurity (12/16/2023)   Hunger Vital Sign    Worried About Running Out of Food in the Last Year: Never true    Ran Out of Food in the Last Year: Never true  Transportation Needs: No Transportation Needs (12/16/2023)   PRAPARE - Administrator, Civil Service (Medical): No    Lack of Transportation (Non-Medical): No  Physical Activity: Insufficiently Active (12/16/2023)   Exercise Vital Sign    Days of Exercise per Week: 2 days    Minutes of Exercise per Session: 20 min  Stress: Stress Concern Present (12/16/2023)   [employer] of Occupational Health - Occupational Stress Questionnaire    Feeling of Stress : Rather much   Social Connections: Moderately Isolated (12/16/2023)   Social Connection and Isolation Panel [NHANES]    Frequency of Communication with Friends and Family: More than three times a week    Frequency of Social Gatherings with Friends and Family: More than three times a week    Attends Religious Services: More than 4 times per year  Active Member of Clubs or Organizations: No    Attends Banker Meetings: Not on file    Marital Status: Widowed  Intimate Partner Violence: Not on file    Outpatient Medications Prior to Visit  Medication Sig Dispense Refill   ascorbic acid  (VITAMIN C) 500 MG tablet Take 1 tablet (500 mg total) by mouth daily.     aspirin  EC 81 MG tablet Take 81 mg by mouth daily. Swallow whole.     blood glucose meter kit and supplies Dispense based on patient and insurance preference. Use up to four times daily as directed. (FOR ICD-10 E10.9, E11.9).  ( One-Touch Verio meter) 1 each 0   Cholecalciferol (VITAMIN D3) 50 MCG (2000 UT) TABS Take 2,000 Units by mouth daily.     Lancets (ONETOUCH ULTRASOFT) lancets Use to check blood glucose three times daily. Use as instructed. E11.9. 100 each 12   ONETOUCH VERIO test strip TEST FOUR TIMES DAILY AS DIRECTED 100 strip 2   rosuvastatin  (CRESTOR ) 5 MG tablet Take 5 mg by mouth every Tuesday.     tamoxifen  (NOLVADEX ) 20 MG tablet TAKE 1 TABLET(20 MG) BY MOUTH DAILY 90 tablet 3   empagliflozin (JARDIANCE) 25 MG TABS tablet Take 25 mg by mouth daily.     No facility-administered medications prior to visit.    No Known Allergies  ROS . See HPI    Objective:     Physical Exam  General: No acute distress. Awake and conversant.  Eyes: Normal conjunctiva, anicteric. Round symmetric pupils.   Respiratory: CTAB. Respirations are non-labored. No wheezing.  Skin: Warm. No rashes or ulcers.  Psych: Alert and oriented. Cooperative, Appropriate mood and affect, Normal judgment.  CV: RRR. No lower extremity edema.   MSK: Normal ambulation. No clubbing or cyanosis.    BP 122/76 (BP Location: Left Arm, Patient Position: Sitting, Cuff Size: Large)   Pulse 80   Temp 98.4 F (36.9 C) (Oral)   Resp 12   Ht 5\' 4"  (1.626 m)   Wt 212 lb 6.4 oz (96.3 kg)   SpO2 95%   BMI 36.46 kg/m  Wt Readings from Last 3 Encounters:  12/21/23 212 lb 6.4 oz (96.3 kg)  12/05/23 211 lb 3.2 oz (95.8 kg)  11/14/22 196 lb 6.4 oz (89.1 kg)       Assessment & Plan:   Problem List Items Addressed This Visit     Encounter to establish care with new provider - Primary   Return to clinic in 3 months for CPE      Hyperglycemia due to type 2 diabetes mellitus (HCC)   Desires to improve diet and increase her exercise.  Started Jardiance 25 mg and glipizide 10 mg.  Recheck A1c in 3 months. Did not voice that she has had difficulty with oral medications in the past continue her A1c      Relevant Medications   empagliflozin (JARDIANCE) 25 MG TABS tablet   glipiZIDE (GLUCOTROL) 10 MG tablet   Other Relevant Orders   Ambulatory referral to Endocrinology    I have discontinued Clare C. Leinen's empagliflozin. I am also having her start on empagliflozin and glipiZIDE. Additionally, I am having her maintain her onetouch ultrasoft, blood glucose meter kit and supplies, OneTouch Verio, rosuvastatin , Vitamin D3, aspirin  EC, ascorbic acid , and tamoxifen .  Meds ordered this encounter  Medications   empagliflozin (JARDIANCE) 25 MG TABS tablet    Sig: Take 1 tablet (25 mg total) by mouth daily before breakfast.  Dispense:  30 tablet    Refill:  2    Supervising Provider:   Randie Bustle A [[id number]]   glipiZIDE (GLUCOTROL) 10 MG tablet    Sig: Take 1 tablet (10 mg total) by mouth daily before breakfast.    Dispense:  30 tablet    Refill:  2    Supervising Provider:   Randie Bustle A [[id number]]

## 2023-12-21 NOTE — Assessment & Plan Note (Signed)
 Desires to improve diet and increase her exercise.  Started Jardiance 25 mg and glipizide 10 mg.  Recheck A1c in 3 months. Did not voice that she has had difficulty with oral medications in the past continue her A1c

## 2023-12-21 NOTE — Assessment & Plan Note (Addendum)
 Return to clinic in 3 months for CPE

## 2023-12-21 NOTE — Patient Instructions (Signed)
 Thank you for choosing [facility] at [facility] for your Primary Care needs. I am excited for the opportunity to partner with you to meet your health care goals. It was a pleasure meeting you today!  Information on diet, exercise, and health maintenance recommendations are listed below. This is information to help you be sure you are on track for optimal health and monitoring.   Please look over this and let us know if you have any questions or if you have completed any of the health maintenance outside of [facility] so that we can be sure your records are up to date.  ___________________________________________________________  MyChart:  For all urgent or time sensitive needs we ask that you please call the office to avoid delays. Our number is [phone]. MyChart is not constantly monitored and due to the large volume of messages a day, replies may take up to 72 business hours.  MyChart Policy: MyChart allows for you to see your visit notes, after visit summary, provider recommendations, lab and tests results, make an appointment, request refills, and contact your provider or the office for non-urgent questions or concerns. Providers are seeing patients during normal business hours and do not have built in time to review MyChart messages.  We ask that you allow a minimum of 3 business days for responses to [employer]. For this reason, please do not send urgent requests through MyChart. Please call the office at [phone]. New and ongoing conditions may require a visit. We have virtual and in-person visits available for your convenience.  Complex MyChart concerns may require a visit. Your provider may request you schedule a virtual or in-person visit to ensure we are providing the best care possible. MyChart messages sent after 11:00 AM on Friday may not be received by the provider until Monday morning.    Lab and Test Results: You will receive your lab and test  results on MyChart as soon as they are completed and results have been sent by the lab or testing facility. Due to this service, you will receive your results BEFORE your provider.  I review lab and test results each morning prior to seeing patients. Some results require collaboration with other providers to ensure you are receiving the most appropriate care. For this reason, we ask that you please allow a minimum of 3-5 business days from the time that ALL results have been received for your provider to receive and review lab and test results and contact you about these.  Most lab and test result comments from the provider will be sent through MyChart. Your provider may recommend changes to the plan of care, follow-up visits, repeat testing, ask questions, or request an office visit to discuss these results. You may reply directly to this message or call the office to provide information for the provider or set up an appointment. In some instances, you will be called with test results and recommendations. Please let us know if this is preferred and we will make note of this in your chart to provide this for you.    If you have not heard a response to your lab or test results in 5 business days from all results returning to MyChart, please call the office to let us know. We ask that you please avoid calling prior to this time unless there is an emergent concern. Due to high call volumes, this can delay the resulting process.  After Hours: For all non-emergency after hours needs, please  call the office at [phone] and select the option to reach the on-call  service. On-call services are shared between multiple [facility] offices and therefore it will not be possible to speak directly with your provider. On-call providers may provide medical advice and recommendations, but are unable to provide refills for maintenance medications.  For all emergency or urgent medical needs after normal business hours, we  recommend that you seek care at the closest Urgent Care or Emergency Department to ensure appropriate treatment in a timely manner.  [facility] has a 24 hour emergency room located on the ground floor for your convenience.   Urgent Concerns During the Business Day Providers are seeing patients from 8AM to 5PM with a busy schedule and are most often not able to respond to non-urgent calls until the end of the day or the next business day. If you should have URGENT concerns during the day, please call and speak to the nurse or schedule a same day appointment so that we can address your concern without delay.   Thank you, again, for choosing me as your health care partner. I appreciate your trust and look forward to learning more about you!   Sandra Marrow Reola Calkins, DNP, FNP-C  ___________________________________________________________  Health Maintenance Recommendations Screening Testing Mammogram Every 1-2 years based on history and risk factors Starting at age 60 Pap Smear Ages 21-39 every 3 years Ages 22-65 every 5 years with HPV testing More frequent testing may be required based on results and history Colon Cancer Screening Every 1-10 years based on test performed, risk factors, and history Starting at age 29 Bone Density Screening Every 2-10 years based on history Starting at age 79 for women Recommendations for men differ based on medication usage, history, and risk factors AAA Screening One time ultrasound Men 59-28 years old who have ever smoked Lung Cancer Screening Low Dose Lung CT every 12 months Age 51-80 years with a 20 pack-year smoking history who still smoke or who have quit within the last 15 years  Screening Labs Routine  Labs: Complete Blood Count (CBC), Complete Metabolic Panel (CMP), Cholesterol (Lipid Panel) Every 6-12 months based on history and medications May be recommended more frequently based on current conditions or previous results Hemoglobin  A1c Lab Every 3-12 months based on history and previous results Starting at age 79 or earlier with diagnosis of diabetes, high cholesterol, BMI >26, and/or risk factors Frequent monitoring for patients with diabetes to ensure blood sugar control Thyroid Panel  Every 6 months based on history, symptoms, and risk factors May be repeated more often if on medication HIV One time testing for all patients 66 and older May be repeated more frequently for patients with increased risk factors or exposure Hepatitis C One time testing for all patients 59 and older May be repeated more frequently for patients with increased risk factors or exposure Gonorrhea, Chlamydia Every 12 months for all sexually active persons 13-24 years Additional monitoring may be recommended for those who are considered high risk or who have symptoms PSA Men 69-17 years old with risk factors Additional screening may be recommended from age 43-69 based on risk factors, symptoms, and history  Vaccine Recommendations Tetanus Booster All adults every 10 years Flu Vaccine All patients 6 months and older every year COVID Vaccine All patients 12 years and older Initial dosing with booster May recommend additional booster based on age and health history HPV Vaccine 2 doses all patients age 71-26 Dosing may be considered  for patients over 26 Shingles Vaccine (Shingrix) 2 doses all adults 50 years and older Pneumonia (Pneumovax 23) All adults 65 years and older May recommend earlier dosing based on health history Pneumonia (Prevnar 53) All adults 65 years and older Dosed 1 year after Pneumovax 23 Pneumonia (Prevnar 20) All adults 65 years and older (adults 19-64 with certain conditions or risk factors) 1 dose  For those who have not received Prevnar 13 vaccine previously   Additional Screening, Testing, and Vaccinations may be recommended on an individualized basis based on family history, health history, risk  factors, and/or exposure.  __________________________________________________________  Diet Recommendations for All Patients  I recommend that all patients maintain a diet low in saturated fats, carbohydrates, and cholesterol. While this can be challenging at first, it is not impossible and small changes can make big differences.  Things to try: Decreasing the amount of soda, sweet tea, and/or juice to one or less per day and replace with water While water is always the first choice, if you do not like water you may consider adding a water additive without sugar to improve the taste other sugar free drinks Replace potatoes with a brightly colored vegetable  Use healthy oils, such as canola oil or olive oil, instead of butter or hard margarine Limit your bread intake to two pieces or less a day Replace regular pasta with low carb pasta options Bake, broil, or grill foods instead of frying Monitor portion sizes  Eat smaller, more frequent meals throughout the day instead of large meals  An important thing to remember is, if you love foods that are not great for your health, you don't have to give them up completely. Instead, allow these foods to be a reward when you have done well. Allowing yourself to still have special treats every once in a while is a nice way to tell yourself thank you for working hard to keep yourself healthy.   Also remember that every day is a new day. If you have a bad day and "fall off the wagon", you can still climb right back up and keep moving along on your journey!  We have resources available to help you!  Some websites that may be helpful include: www.http://www.wall-moore.info/  Www.VeryWellFit.com _____________________________________________________________  Activity Recommendations for All Patients  I recommend that all adults get at least 30 minutes of moderate physical activity that elevates your heart rate at least 5 days out of the week.  Some examples  include: Walking or jogging at a pace that allows you to carry on a conversation Cycling (stationary bike or outdoors) Water aerobics Yoga Weight lifting Dancing If physical limitations prevent you from putting stress on your joints, exercise in a pool or seated in a chair are excellent options.  Do determine your MAXIMUM heart rate for activity: 220 - YOUR AGE = MAX Heart Rate   Remember! Do not push yourself too hard.  Start slowly and build up your pace, speed, weight, time in exercise, etc.  Allow your body to rest between exercise and get good sleep. You will need more water than normal when you are exerting yourself. Do not wait until you are thirsty to drink. Drink with a purpose of getting in at least 8, 8 ounce glasses of water a day plus more depending on how much you exercise and sweat.    If you begin to develop dizziness, chest pain, abdominal pain, jaw pain, shortness of breath, headache, vision changes, lightheadedness, or other concerning symptoms,  stop the activity and allow your body to rest. If your symptoms are severe, seek emergency evaluation immediately. If your symptoms are concerning, but not severe, please let us know so that we can recommend further evaluation.

## 2024-01-04 ENCOUNTER — Telehealth: Payer: Self-pay

## 2024-01-04 NOTE — Telephone Encounter (Signed)
 Attempt to call pt about negative guardant result. Number was not in service. Called alternate number which was her sisters number and received no answer.

## 2024-01-07 ENCOUNTER — Encounter: Payer: Self-pay | Admitting: Adult Health

## 2024-01-08 ENCOUNTER — Encounter: Payer: Self-pay | Admitting: *Deleted

## 2024-01-08 DIAGNOSIS — K317 Polyp of stomach and duodenum: Secondary | ICD-10-CM | POA: Insufficient documentation

## 2024-01-25 ENCOUNTER — Encounter: Payer: Self-pay | Admitting: *Deleted

## 2024-02-05 ENCOUNTER — Encounter: Payer: Self-pay | Admitting: Student

## 2024-02-25 ENCOUNTER — Encounter: Payer: Self-pay | Admitting: Family Medicine

## 2024-03-13 ENCOUNTER — Other Ambulatory Visit: Payer: Self-pay | Admitting: Adult Health

## 2024-03-13 DIAGNOSIS — Z9889 Other specified postprocedural states: Secondary | ICD-10-CM

## 2024-03-13 DIAGNOSIS — Z1231 Encounter for screening mammogram for malignant neoplasm of breast: Secondary | ICD-10-CM

## 2024-03-28 ENCOUNTER — Ambulatory Visit: Admitting: Student

## 2024-03-28 ENCOUNTER — Encounter: Payer: Self-pay | Admitting: Student

## 2024-03-28 VITALS — BP 121/83 | HR 98 | Ht 64.0 in | Wt 208.6 lb

## 2024-03-28 DIAGNOSIS — E1165 Type 2 diabetes mellitus with hyperglycemia: Secondary | ICD-10-CM | POA: Diagnosis not present

## 2024-03-28 DIAGNOSIS — Z7985 Long-term (current) use of injectable non-insulin antidiabetic drugs: Secondary | ICD-10-CM

## 2024-03-28 DIAGNOSIS — R252 Cramp and spasm: Secondary | ICD-10-CM

## 2024-03-28 DIAGNOSIS — E66812 Obesity, class 2: Secondary | ICD-10-CM

## 2024-03-28 DIAGNOSIS — Z0000 Encounter for general adult medical examination without abnormal findings: Secondary | ICD-10-CM | POA: Diagnosis not present

## 2024-03-28 DIAGNOSIS — E7800 Pure hypercholesterolemia, unspecified: Secondary | ICD-10-CM | POA: Diagnosis not present

## 2024-03-28 DIAGNOSIS — K5900 Constipation, unspecified: Secondary | ICD-10-CM

## 2024-03-28 DIAGNOSIS — Z6835 Body mass index (BMI) 35.0-35.9, adult: Secondary | ICD-10-CM

## 2024-03-28 DIAGNOSIS — E559 Vitamin D deficiency, unspecified: Secondary | ICD-10-CM

## 2024-03-28 NOTE — Assessment & Plan Note (Signed)
 Chronic. Discussed probiotics and fiber.  Ensure adequate hydration. - Recommend daily probiotic. - Encourage increased dietary fiber. Encouraged increased hydration and fiber in diet. Daily probiotics. If bowels not moving can use MOM 2 tbls po in 4 oz of warm prune juice by mouth every 2-3 days. If no results then repeat in 4 hours with  Dulcolax suppository pr, may repeat again in 4 more hours as needed. Seek care if symptoms worsen. Consider daily Miralax  and/or Dulcolax if symptoms persist.

## 2024-03-28 NOTE — Assessment & Plan Note (Addendum)
 Routine wellness visit. Reviewed medications, health status, and lab work. Patient encouraged to maintain heart healthy diet, regular exercise, adequate sleep. Consider daily probiotics. Take medications as prescribed.

## 2024-03-28 NOTE — Patient Instructions (Signed)
 Encouraged increased hydration and fiber in diet. Daily probiotics. If bowels not moving can use MOM 2 tbls po in 4 oz of warm prune juice by mouth every 2-3 days. If no results then repeat in 4 hours with  Dulcolax suppository pr, may repeat again in 4 more hours as needed. Seek care if symptoms worsen. Consider daily Miralax and/or Dulcolax if symptoms persist.

## 2024-03-28 NOTE — Assessment & Plan Note (Addendum)
 On rosuvastatin  5 mg weekly. Cholesterol levels to be re-evaluated. - Order cholesterol panel. - Review cholesterol levels and adjust medication if necessary.  Encourage heart healthy diet such as MIND or DASH diet, increase exercise, avoid trans fats, simple carbohydrates and processed foods, consider a krill or fish or flaxseed oil cap daily.  Update lipid panel.

## 2024-03-28 NOTE — Assessment & Plan Note (Signed)
 On Mounjaro 0.5 mg and 25 mg daily.  Follows with endocrinology.   - Order A1c. -Foot exam performed today - Encourage hydration and fiber intake. - Recommend daily probiotic. - Schedule annual dilated eye exam.

## 2024-03-28 NOTE — Progress Notes (Signed)
 Subjective:     Patient ID: Sandra Bishop, female    DOB: August 21, 1970, 53 y.o.   MRN: [id number]  No chief complaint on file.   HPI   History of Present Illness Sandra Bishop is a 53 year old female with diabetes who presents for annual physical exam.  Overall feels well overall reports a general diet, admits needing to eat healthier diet does consume a lot of fried chicken, wanting to cut back.  She has cut back on her soda and trying to incorporate more water.She avoids beef and corn due to digestive issues. She plans to schedule a dilated eye exam for diabetic retinopathy. She quit smoking 12-14 years ago.  Denies EtOH.    Type 2 DM- Jardiance  25 mg, Mounjaro weekly injection Follows with endocrinology she was taken off glipizide  and was started on Mounjaro. Stopped glipizide  in June 2025. Due to side effects she does not want to take Mounjaro, she has upcoming appointment with endocrinology and wishes to discuss alternative options for diabetes management.  HLD-rosuvastatin  5 mg once per week  LMP: Has not has a cycle since 2022, when going through breast cancer treatment  Patient denies fever, chills, SOB, CP, palpitations, dyspnea, edema, HA, vision changes, N/V/D, abdominal pain, urinary symptoms, rash, weight changes, and recent illness or hospitalizations.   History of Present Illness              Health Maintenance Due  Topic Date Due   Hepatitis B Vaccines 19-59 Average Risk (1 of 3 - 19+ 3-dose series) Never done   OPHTHALMOLOGY EXAM  03/10/2020   Zoster Vaccines- Shingrix (2 of 2) 04/10/2022    Past Medical History:  Diagnosis Date   Allergy    Anemia    Arthritis    Breast cancer (HCC)    Right   Breast cancer, right (HCC)    Constipation    Depression June 13, 2023   Son passed away   Diabetes mellitus without complication (HCC)    Family history of adverse reaction to anesthesia    Aunt is hard to wake up from general anesthesia   Family  history of breast cancer    Family history of colon cancer    Family history of lung cancer    Family history of pancreatic cancer    History of migraine    during pregancy   HLD (hyperlipidemia)    Obesity    Palpitations     Past Surgical History:  Procedure Laterality Date   BREAST BIOPSY Right 02/11/2021   x2   BREAST BIOPSY Right 03/22/2021   x3   BREAST EXCISIONAL BIOPSY Left 02/2022   BREAST LUMPECTOMY Right 02/2022   BREAST LUMPECTOMY WITH RADIOACTIVE SEED AND SENTINEL LYMPH NODE BIOPSY Right 08/17/2021   Procedure: RIGHT BREAST LUMPECTOMY WITH RADIOACTIVE SEED AND SENTINEL LYMPH NODE BIOPSY;  Surgeon: Aron Shoulders, MD;  Location: [facility];  Service: General;  Laterality: Right;   EYE SURGERY Right    PORT-A-CATH REMOVAL N/A 08/17/2021   Procedure: REMOVAL PORT-A-CATH;  Surgeon: Aron Shoulders, MD;  Location: [facility];  Service: General;  Laterality: N/A;   PORTACATH PLACEMENT N/A 03/02/2021   Procedure: INSERTION PORT-A-CATH;  Surgeon: Aron Shoulders, MD;  Location: [facility];  Service: General;  Laterality: N/A;   RADIOACTIVE SEED GUIDED AXILLARY SENTINEL LYMPH NODE Right 08/17/2021   Procedure: RADIOACTIVE SEED GUIDED RIGHT SENTINEL LYMPH NODE EXCISION;  Surgeon: Aron Shoulders, MD;  Location: [facility];  Service: General;  Laterality: Right;  RADIOACTIVE SEED GUIDED EXCISIONAL BREAST BIOPSY Left 08/17/2021   Procedure: RADIOACTIVE SEED GUIDED EXCISIONAL LEFT BREAST BIOPSY;  Surgeon: Aron Shoulders, MD;  Location: [facility];  Service: General;  Laterality: Left;   TUBAL LIGATION      Family History  Problem Relation Age of Onset   Hypertension Mother    Pancreatic cancer Father 40   Breast cancer Half-Sister 51       negative genetic testing   Colon cancer Maternal Uncle        dx >50   Lung cancer Maternal Uncle        dx 18s, hx smoking   Cancer Paternal Aunt        unknown type, dx early 46s   Heart failure Maternal Grandmother    Breast cancer Other 78       mother's first  cousin   Cancer Sister     Social History   Socioeconomic History   Marital status: Divorced    Spouse name: Not on file   Number of children: 2   Years of education: Not on file   Highest education level: Associate degree: occupational, Scientist, product/process development, or vocational program  Occupational History   Not on file  Tobacco Use   Smoking status: Former    Current packs/day: 0.00    Types: Cigarettes    Quit date: 12/28/2010    Years since quitting: 13.2   Smokeless tobacco: Never   Tobacco comments:    quit in 2012, previously smoked 15 years  Vaping Use   Vaping status: Not on file  Substance and Sexual Activity   Alcohol use: Not Currently    Comment: occ   Drug use: No   Sexual activity: Yes    Birth control/protection: Surgical  Other Topics Concern   Not on file  Social History Narrative   Son passed 05/2023   Social Drivers of Health   Financial Resource Strain: Medium Risk (03/27/2024)   Overall Financial Resource Strain (CARDIA)    Difficulty of Paying Living Expenses: Somewhat hard  Food Insecurity: Food Insecurity Present (03/27/2024)   Hunger Vital Sign    Worried About Running Out of Food in the Last Year: Often true    Ran Out of Food in the Last Year: Often true  Transportation Needs: No Transportation Needs (03/27/2024)   PRAPARE - Administrator, Civil Service (Medical): No    Lack of Transportation (Non-Medical): No  Physical Activity: Insufficiently Active (03/27/2024)   Exercise Vital Sign    Days of Exercise per Week: 7 days    Minutes of Exercise per Session: 20 min  Stress: Stress Concern Present (03/27/2024)   [employer] of Occupational Health - Occupational Stress Questionnaire    Feeling of Stress: To some extent  Social Connections: Moderately Integrated (03/27/2024)   Social Connection and Isolation Panel    Frequency of Communication with Friends and Family: More than three times a week    Frequency of Social Gatherings with  Friends and Family: More than three times a week    Attends Religious Services: More than 4 times per year    Active Member of [employer] or Organizations: Yes    Attends Banker Meetings: More than 4 times per year    Marital Status: Widowed  Intimate Partner Violence: Not on file    Outpatient Medications Prior to Visit  Medication Sig Dispense Refill   ascorbic acid  (VITAMIN C) 500 MG tablet Take 1 tablet (500 mg total)  by mouth daily.     aspirin  EC 81 MG tablet Take 81 mg by mouth daily. Swallow whole.     blood glucose meter kit and supplies Dispense based on patient and insurance preference. Use up to four times daily as directed. (FOR ICD-10 E10.9, E11.9).  ( One-Touch Verio meter) 1 each 0   Cholecalciferol (VITAMIN D3) 50 MCG (2000 UT) TABS Take 2,000 Units by mouth daily.     empagliflozin  (JARDIANCE ) 25 MG TABS tablet Take 1 tablet (25 mg total) by mouth daily before breakfast. 30 tablet 2   Lancets (ONETOUCH ULTRASOFT) lancets Use to check blood glucose three times daily. Use as instructed. E11.9. 100 each 12   ONETOUCH VERIO test strip TEST FOUR TIMES DAILY AS DIRECTED 100 strip 2   rosuvastatin  (CRESTOR ) 5 MG tablet Take 5 mg by mouth every Tuesday.     tamoxifen  (NOLVADEX ) 20 MG tablet TAKE 1 TABLET(20 MG) BY MOUTH DAILY 90 tablet 3   glipiZIDE  (GLUCOTROL ) 10 MG tablet Take 1 tablet (10 mg total) by mouth daily before breakfast. 30 tablet 2   No facility-administered medications prior to visit.    No Known Allergies  ROS See HPI    Objective:    Physical Exam Constitutional:      General: She is not in acute distress.    Appearance: She is obese. She is not ill-appearing, toxic-appearing or diaphoretic.  HENT:     Head: Normocephalic and atraumatic.     Right Ear: Tympanic membrane, ear canal and external ear normal.     Left Ear: Tympanic membrane, ear canal and external ear normal.     Nose: Nose normal. No congestion.     Mouth/Throat:     Mouth:  Mucous membranes are moist.     Pharynx: Oropharynx is clear.  Eyes:     Extraocular Movements: Extraocular movements intact.     Right eye: Normal extraocular motion.     Left eye: Normal extraocular motion.     Conjunctiva/sclera: Conjunctivae normal.     Pupils: Pupils are equal, round, and reactive to light.  Neck:     Thyroid : No thyroid  mass or thyromegaly.     Vascular: No carotid bruit or JVD.  Cardiovascular:     Rate and Rhythm: Normal rate and regular rhythm.     Pulses: Normal pulses.          Radial pulses are 2+ on the right side and 2+ on the left side.       Dorsalis pedis pulses are 2+ on the right side and 2+ on the left side.     Heart sounds: Normal heart sounds, S1 normal and S2 normal. No murmur heard.    No friction rub. No gallop.  Pulmonary:     Effort: Pulmonary effort is normal. No respiratory distress.     Breath sounds: Normal breath sounds.  Abdominal:     General: Bowel sounds are normal. There is no distension.     Palpations: Abdomen is soft.     Tenderness: There is no abdominal tenderness. There is no guarding.  Musculoskeletal:        General: Normal range of motion.     Cervical back: Full passive range of motion without pain and normal range of motion. No edema or erythema.     Right lower leg: No edema.     Left lower leg: No edema.  Lymphadenopathy:     Cervical: No cervical adenopathy.  Skin:  General: Skin is warm and dry.     Capillary Refill: Capillary refill takes less than 2 seconds.  Neurological:     General: No focal deficit present.     Mental Status: She is alert and oriented to person, place, and time.     Cranial Nerves: No cranial nerve deficit.     Motor: No weakness.     Coordination: Coordination normal.     Gait: Gait normal.     Deep Tendon Reflexes: Reflexes normal.  Psychiatric:        Mood and Affect: Mood normal.        Behavior: Behavior normal.        Thought Content: Thought content normal.   DM Foot  Exam Color: normal Sensation Monofilament:normal right, left, and bilateral  Sensation Vibration:normal right, left, and bilateral  Circulation: Pulses normal right, left, and bilateral   No leasions  BP 121/83   Pulse 98   Ht 5' 4 (1.626 m)   Wt 208 lb 9.6 oz (94.6 kg)   BMI 35.81 kg/m  Wt Readings from Last 3 Encounters:  03/28/24 208 lb 9.6 oz (94.6 kg)  12/21/23 212 lb 6.4 oz (96.3 kg)  12/05/23 211 lb 3.2 oz (95.8 kg)       Assessment & Plan:   Problem List Items Addressed This Visit     Annual visit for general adult medical examination without abnormal findings - Primary   Routine wellness visit. Reviewed medications, health status, and lab work. Patient encouraged to maintain heart healthy diet, regular exercise, adequate sleep. Consider daily probiotics. Take medications as prescribed.          Relevant Orders   CBC with Differential/Platelet   Comp Met (CMET)   Hepatitis C Antibody   Constipation   Chronic. Discussed probiotics and fiber.  Ensure adequate hydration. - Recommend daily probiotic. - Encourage increased dietary fiber. Encouraged increased hydration and fiber in diet. Daily probiotics. If bowels not moving can use MOM 2 tbls po in 4 oz of warm prune juice by mouth every 2-3 days. If no results then repeat in 4 hours with  Dulcolax suppository pr, may repeat again in 4 more hours as needed. Seek care if symptoms worsen. Consider daily Miralax  and/or Dulcolax if symptoms persist.        Hyperglycemia due to type 2 diabetes mellitus (HCC)   On Mounjaro 0.5 mg and 25 mg daily.  Follows with endocrinology.   - Order A1c. -Foot exam performed today - Encourage hydration and fiber intake. - Recommend daily probiotic. - Schedule annual dilated eye exam.      Relevant Orders   HgB A1c   Urine Microalbumin w/creat. ratio   Obesity   Encouraged DASH or MIND diet, decrease po intake and increase exercise as tolerated. Needs 7-8 hours of sleep nightly.  Avoid trans fats, eat small, frequent meals every 4-5 hours with lean proteins, complex carbs and healthy fats. Minimize simple carbs, high fat foods and processed foods        Pure hypercholesterolemia   On rosuvastatin  5 mg weekly. Cholesterol levels to be re-evaluated. - Order cholesterol panel. - Review cholesterol levels and adjust medication if necessary.  Encourage heart healthy diet such as MIND or DASH diet, increase exercise, avoid trans fats, simple carbohydrates and processed foods, consider a krill or fish or flaxseed oil cap daily.  Update lipid panel.      Relevant Orders   Lipid panel   Vitamin D  deficiency  Patient taken at 1000 units vitamin D  daily OTC.  Supplement monitor.      Relevant Orders   Vitamin D  (25 hydroxy)   Other Visit Diagnoses       Leg cramp       Relevant Orders   Vitamin B12     HCM Pap- Last Pap-August 2025- Follows with OBGYN- Lauraine Barker- Requested records Immunizations: Declines pneumonia vaccine today.  Patient to get shingles vaccine second dose at pharmacy.  Follow-up 3 months  I have discontinued Yasmine C. Gallaher's glipiZIDE . I am also having her maintain her onetouch ultrasoft, blood glucose meter kit and supplies, OneTouch Verio, rosuvastatin , Vitamin D3, aspirin  EC, ascorbic acid , tamoxifen , and empagliflozin .  No orders of the defined types were placed in this encounter.

## 2024-03-28 NOTE — Assessment & Plan Note (Signed)
 Patient taken at 1000 units vitamin D  daily OTC.  Supplement monitor.

## 2024-03-28 NOTE — Assessment & Plan Note (Signed)
 Encouraged DASH or MIND diet, decrease po intake and increase exercise as tolerated. Needs 7-8 hours of sleep nightly. Avoid trans fats, eat small, frequent meals every 4-5 hours with lean proteins, complex carbs and healthy fats. Minimize simple carbs, high fat foods and processed foods

## 2024-03-29 LAB — COMPREHENSIVE METABOLIC PANEL WITH GFR
AG Ratio: 1.2 (calc) (ref 1.0–2.5)
ALT: 14 U/L (ref 6–29)
AST: 12 U/L (ref 10–35)
Albumin: 4.1 g/dL (ref 3.6–5.1)
Alkaline phosphatase (APISO): 54 U/L (ref 37–153)
BUN: 14 mg/dL (ref 7–25)
CO2: 24 mmol/L (ref 20–32)
Calcium: 9.1 mg/dL (ref 8.6–10.4)
Chloride: 104 mmol/L (ref 98–110)
Creat: 0.76 mg/dL (ref 0.50–1.03)
Globulin: 3.3 g/dL (ref 1.9–3.7)
Glucose, Bld: 99 mg/dL (ref 65–99)
Potassium: 4 mmol/L (ref 3.5–5.3)
Sodium: 137 mmol/L (ref 135–146)
Total Bilirubin: 0.3 mg/dL (ref 0.2–1.2)
Total Protein: 7.4 g/dL (ref 6.1–8.1)
eGFR: 94 mL/min/1.73m2 (ref >=60)

## 2024-03-29 LAB — MICROALBUMIN / CREATININE URINE RATIO
Creatinine, Urine: 107 mg/dL (ref 20–275)
Microalb Creat Ratio: 3 mg/g{creat} (ref <30)
Microalb, Ur: 0.3 mg/dL

## 2024-03-29 LAB — CBC WITH DIFFERENTIAL/PLATELET
Absolute Lymphocytes: 1958 {cells}/uL (ref 850–3900)
Absolute Monocytes: 795 {cells}/uL (ref 200–950)
Basophils Absolute: 23 {cells}/uL (ref 0–200)
Basophils Relative: 0.3 %
Eosinophils Absolute: 60 {cells}/uL (ref 15–500)
Eosinophils Relative: 0.8 %
HCT: 36.7 % (ref 35.0–45.0)
Hemoglobin: 11.8 g/dL (ref 11.7–15.5)
MCH: 27.3 pg (ref 27.0–33.0)
MCHC: 32.2 g/dL (ref 32.0–36.0)
MCV: 84.8 fL (ref 80.0–100.0)
MPV: 10.5 fL (ref 7.5–12.5)
Monocytes Relative: 10.6 %
Neutro Abs: 4665 {cells}/uL (ref 1500–7800)
Neutrophils Relative %: 62.2 %
Platelets: 238 Thousand/uL (ref 140–400)
RBC: 4.33 Million/uL (ref 3.80–5.10)
RDW: 13.6 % (ref 11.0–15.0)
Total Lymphocyte: 26.1 %
WBC: 7.5 Thousand/uL (ref 3.8–10.8)

## 2024-03-29 LAB — VITAMIN D 25 HYDROXY (VIT D DEFICIENCY, FRACTURES): Vit D, 25-Hydroxy: 40 ng/mL (ref 30–100)

## 2024-03-29 LAB — LIPID PANEL
Cholesterol: 122 mg/dL (ref <200)
HDL: 65 mg/dL (ref >=50)
LDL Cholesterol (Calc): 36 mg/dL
Non-HDL Cholesterol (Calc): 57 mg/dL (ref <130)
Total CHOL/HDL Ratio: 1.9 (calc) (ref <5.0)
Triglycerides: 119 mg/dL (ref <150)

## 2024-03-29 LAB — HEMOGLOBIN A1C
Hgb A1c MFr Bld: 7.1 % — ABNORMAL HIGH (ref <5.7)
Mean Plasma Glucose: 157 mg/dL
eAG (mmol/L): 8.7 mmol/L

## 2024-03-29 LAB — VITAMIN B12: Vitamin B-12: 240 pg/mL (ref 200–1100)

## 2024-03-29 LAB — HEPATITIS C ANTIBODY: Hepatitis C Ab: NONREACTIVE

## 2024-03-31 ENCOUNTER — Ambulatory Visit: Payer: Self-pay | Admitting: Student

## 2024-04-02 ENCOUNTER — Other Ambulatory Visit: Payer: Self-pay | Admitting: *Deleted

## 2024-04-02 DIAGNOSIS — E1165 Type 2 diabetes mellitus with hyperglycemia: Secondary | ICD-10-CM

## 2024-04-02 MED ORDER — EMPAGLIFLOZIN 25 MG PO TABS: 25.0000 mg | ORAL_TABLET | Freq: Every day | ORAL | 2 refills | Status: DC

## 2024-04-10 ENCOUNTER — Inpatient Hospital Stay: Payer: BC Managed Care – PPO | Attending: Hematology and Oncology | Admitting: Hematology and Oncology

## 2024-04-10 VITALS — BP 127/86 | HR 91 | Temp 98.0°F | Resp 17 | Wt 208.3 lb

## 2024-04-10 DIAGNOSIS — C50411 Malignant neoplasm of upper-outer quadrant of right female breast: Secondary | ICD-10-CM | POA: Insufficient documentation

## 2024-04-10 DIAGNOSIS — C773 Secondary and unspecified malignant neoplasm of axilla and upper limb lymph nodes: Secondary | ICD-10-CM | POA: Insufficient documentation

## 2024-04-10 DIAGNOSIS — Z170 Estrogen receptor positive status [ER+]: Secondary | ICD-10-CM | POA: Diagnosis not present

## 2024-04-10 NOTE — Assessment & Plan Note (Signed)
 02/15/2021:Screening mammogram: a possible mass in the right breast  Diagnostic mammogram and US : a persistent irregular mass 1.2 cm within the upper-outer right breast. 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    Recommendation based on multidisciplinary tumor board: 1. Neoadjuvant chemotherapy with Adriamycin  and Cytoxan  dose dense 4 followed by Taxol  weekly 9 (stopped early for AEs)  2. Followed by breast conserving surgery with  targeted axillary dissection: 08/05/2021: Pathologic complete response, 0/3 lymph nodes negative 3. Followed by adjuvant radiation therapy 09/30/2021-11/07/2021 4.  Followed by adjuvant antiestrogen therapy (patient was premenopausal prior to chemo)  ------------------------------------------------------------------------------------------------------------------------------------ Tamoxifen  toxicities: Other than hot flashes she is tolerating tamoxifen  extremely well. I encouraged her to take tamoxifen  on a consistent basis at the same time   Abemaciclib cannot be started because she is still premenopausal and requires tamoxifen .   Lab review: 10/09/22: FSH 12.8 (still premenopausal), estradiol  5.5   Breast cancer surveillance:  Mammogram right breast: Scheduled for 04/24/2024   Follow-up once the clinical trial is approved with Ribociclib (WIDER).  Then we will check if she is in menopause to see if she would qualify for the trial at that time.     Return to clinic in 1 year for follow-up

## 2024-04-10 NOTE — Progress Notes (Signed)
 Patient Care Team: Wheeler Harlene CROME, NP as PCP - General (Nurse Practitioner) Aron Shoulders, MD as Consulting Physician (General Surgery) Odean Potts, MD as Consulting Physician (Hematology and Oncology) Dewey Rush, MD as Consulting Physician (Radiation Oncology)  DIAGNOSIS:  Encounter Diagnosis  Name Primary?   Malignant neoplasm of upper-outer quadrant of right breast in female, estrogen receptor positive (HCC) Yes    SUMMARY OF ONCOLOGIC HISTORY: Oncology History  Malignant neoplasm of upper-outer quadrant of right breast in female, estrogen receptor positive (HCC)  02/15/2021 Initial Diagnosis   Screening mammogram: a possible mass in the right breast  Diagnostic mammogram and US : a persistent irregular mass 1.2 cm within the upper-outer right breast. 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    02/23/2021 Cancer Staging   Staging form: Breast, AJCC 8th Edition - Clinical stage from 02/23/2021: Stage IIB (cT1c, cN1(f), cM0, G3, ER+, PR-, HER2-) - Signed by Odean Potts, MD on 02/23/2021 Stage prefix: Initial diagnosis Method of lymph node assessment: Core biopsy Histologic grading system: 3 grade system   03/04/2021 - 07/01/2021 Neo-Adjuvant Chemotherapy   Received four cycles of neoadjuvant Adriamycin  and Cytoxan  from 03/04/2021-04/14/2021.  Followed by 9 cycles of Taxol  from 04/28/2021-07/01/2021.  Taxol  was dose reduced beginning with cycle 6 due to restless legs and stopped after 9 cycles due to peripheral neuropathy.    03/10/2021 Genetic Testing   Negative genetic testing. No pathogenic variants identified on the [facility] CancerNext-Expanded+RNA panel. The report date is 03/10/2021.  The CancerNext-Expanded + RNAinsight gene panel offered by [employer] and includes sequencing and rearrangement analysis for the following 77 genes: IP, ALK, APC*, ATM*, AXIN2, BAP1, BARD1, BLM, BMPR1A, BRCA1*, BRCA2*,  BRIP1*, CDC73, CDH1*,CDK4, CDKN1B, CDKN2A, CHEK2*, CTNNA1, DICER1, FANCC, FH, FLCN, GALNT12, KIF1B, LZTR1, MAX, MEN1, MET, MLH1*, MSH2*, MSH3, MSH6*, MUTYH*, NBN, NF1*, NF2, NTHL1, PALB2*, PHOX2B, PMS2*, POT1, PRKAR1A, PTCH1, PTEN*, RAD51C*, RAD51D*,RB1, RECQL, RET, SDHA, SDHAF2, SDHB, SDHC, SDHD, SMAD4, SMARCA4, SMARCB1, SMARCE1, STK11, SUFU, TMEM127, TP53*,TSC1, TSC2, VHL and XRCC2 (sequencing and deletion/duplication); EGFR, EGLN1, HOXB13, KIT, MITF, PDGFRA, POLD1 and POLE (sequencing only); EPCAM and GREM1 (deletion/duplication only).   09/20/2021 - 11/08/2021 Radiation Therapy   Site Technique Total Dose (Gy) Dose per Fx (Gy) Completed Fx Beam Energies  Breast, Right: Breast_R 3D 50.4/50.4 1.8 28/28 10X  Breast, Right: Breast_R_SCLV 3D 50.4/50.4 1.8 28/28 6X, 10X  Breast, Right: Breast_R_Bst 3D 10/10 2 5/5 6X, 10X     10/31/2021 -  Anti-estrogen oral therapy   Tamoxifen  x 2-5 years followed by switching to aromatase inhibitor once menopausal     CHIEF COMPLIANT: Follow-up on tamoxifen   HISTORY OF PRESENT ILLNESS:   History of Present Illness Sandra Bishop is a 53 year old female with diabetes and breast cancer who presents for follow-up regarding her diabetes management and breast cancer surveillance. She was referred by Manuelita, her PA, for diabetes management and breast cancer surveillance.  She continues tamoxifen  therapy for breast cancer and is scheduled for her annual mammogram on October 9th, following a previous mammogram in June. She experiences shooting pains and numbness, attributed to a nerve block and scar tissue from breast cancer treatment. She also manages breast lymphedema with supportive bras. Her diabetes management has resumed with improved blood glucose levels under endocrinology care.     ALLERGIES:  has no known allergies.  MEDICATIONS:  Current Outpatient Medications  Medication Sig Dispense Refill   ascorbic acid  (VITAMIN C) 500 MG tablet  Take 1 tablet (500  mg total) by mouth daily.     aspirin  EC 81 MG tablet Take 81 mg by mouth daily. Swallow whole.     blood glucose meter kit and supplies Dispense based on patient and insurance preference. Use up to four times daily as directed. (FOR ICD-10 E10.9, E11.9).  ( One-Touch Verio meter) 1 each 0   Cholecalciferol (VITAMIN D3) 50 MCG (2000 UT) TABS Take 2,000 Units by mouth daily.     empagliflozin  (JARDIANCE ) 25 MG TABS tablet Take 1 tablet (25 mg total) by mouth daily before breakfast. 30 tablet 2   Lancets (ONETOUCH ULTRASOFT) lancets Use to check blood glucose three times daily. Use as instructed. E11.9. 100 each 12   ONETOUCH VERIO test strip TEST FOUR TIMES DAILY AS DIRECTED 100 strip 2   rosuvastatin  (CRESTOR ) 5 MG tablet Take 5 mg by mouth every Tuesday.     tamoxifen  (NOLVADEX ) 20 MG tablet TAKE 1 TABLET(20 MG) BY MOUTH DAILY 90 tablet 3   No current facility-administered medications for this visit.    PHYSICAL EXAMINATION: ECOG PERFORMANCE STATUS: 1 - Symptomatic but completely ambulatory  Vitals:   04/10/24 0903  BP: 127/86  Pulse: 91  Resp: 17  Temp: 98 F (36.7 C)  SpO2: 99%   Filed Weights   04/10/24 0903  Weight: 208 lb 4.8 oz (94.5 kg)    Physical Exam   (exam performed in the presence of a chaperone)  LABORATORY DATA:  I have reviewed the data as listed    Latest Ref Rng & Units 03/28/2024    3:21 PM 12/05/2023   11:30 AM 03/28/2023    9:15 AM  CMP  Glucose 65 - 99 mg/dL 99  787  809   BUN 7 - 25 mg/dL 14  11  15    Creatinine 0.50 - 1.03 mg/dL 9.23  9.19  9.19   Sodium 135 - 146 mmol/L 137  137  138   Potassium 3.5 - 5.3 mmol/L 4.0  3.9  4.2   Chloride 98 - 110 mmol/L 104  106  105   CO2 20 - 32 mmol/L 24  26  26    Calcium  8.6 - 10.4 mg/dL 9.1  9.3  9.5   Total Protein 6.1 - 8.1 g/dL 7.4  8.1  7.6   Total Bilirubin 0.2 - 1.2 mg/dL 0.3  0.3  0.3   Alkaline Phos 38 - 126 U/L  68  61   AST 10 - 35 U/L 12  13  11    ALT 6 - 29 U/L 14  16  13      Lab  Results  Component Value Date   WBC 7.5 03/28/2024   HGB 11.8 03/28/2024   HCT 36.7 03/28/2024   MCV 84.8 03/28/2024   PLT 238 03/28/2024   NEUTROABS 4,665 03/28/2024    ASSESSMENT & PLAN:  Malignant neoplasm of upper-outer quadrant of right breast in female, estrogen receptor positive (HCC) 02/15/2021:Screening mammogram: a possible mass in the right breast  Diagnostic mammogram and US : a persistent irregular mass 1.2 cm within the upper-outer right breast. 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    Recommendation based on multidisciplinary tumor board: 1. Neoadjuvant chemotherapy with Adriamycin  and Cytoxan  dose dense 4 followed by Taxol  weekly 9 (stopped early for AEs)  2. Followed by breast conserving surgery with  targeted axillary dissection: 08/05/2021: Pathologic complete response, 0/3 lymph nodes negative 3.  Followed by adjuvant radiation therapy 09/30/2021-11/07/2021 4.  Followed by adjuvant antiestrogen therapy (patient was premenopausal prior to chemo)  ------------------------------------------------------------------------------------------------------------------------------------ Tamoxifen  toxicities: Other than hot flashes she is tolerating tamoxifen  extremely well. I encouraged her to take tamoxifen  on a consistent basis at the same time   Abemaciclib cannot be started because she is still premenopausal and requires tamoxifen .   Lab review: 10/09/22: FSH 12.8 (still premenopausal), estradiol  5.5   Breast cancer surveillance:  Mammogram right breast: Scheduled for 04/24/2024   Follow-up once the clinical trial is approved with Ribociclib (WIDER).  Then we will check if she is in menopause to see if she would qualify for the trial at that time.     Her son passed away from drug overdose in November 2024.  She is still grieving his loss and misses him dearly. Return to clinic in 1 year for  follow-up ------------------------------------- Assessment and Plan Assessment & Plan Estrogen receptor positive malignant neoplasm of right breast, upper-outer quadrant Continues tamoxifen  with reports of pain and numbness, likely due to scar tissue or nerve block. Mammogram scheduled for routine follow-up. - Renew tamoxifen  prescription. - Ensure mammogram is completed on October 9th.  Breast lymphedema Managed with supportive bras and a machine. Reports no perceived effectiveness after two months, though it is a slow process. - Encourage continued use of supportive bras and the machine.  Breast scar tissue Significant scar tissue causing occasional pain and numbness, common post-surgery symptom and not a cause for concern.  Type 2 diabetes mellitus Recent improvement in blood work. Resumed medications after stopping due to son's passing. Under endocrinologist care and committed to maintaining health.      No orders of the defined types were placed in this encounter.  The patient has a good understanding of the overall plan. she agrees with it. she will call with any problems that may develop before the next visit here. Total time spent: 30 mins including face to face time and time spent for planning, charting and co-ordination of care   Viinay K Maryori Weide, MD 04/10/24

## 2024-04-14 ENCOUNTER — Telehealth: Payer: Self-pay | Admitting: Student

## 2024-04-14 ENCOUNTER — Encounter: Payer: Self-pay | Admitting: *Deleted

## 2024-04-14 NOTE — Telephone Encounter (Signed)
 Copied from CRM #[id number]. Topic: General - Other >> Apr 14, 2024 11:59 AM Revonda D wrote: Reason for CRM: Pt is calling to confirm if the office is abel to send a copy of her labs to another office. Pt stated that she will callback once she has the fax number and name of office. >> Apr 14, 2024 12:06 PM Martinique E wrote: Patient called back and would like these faxed to [phone].

## 2024-04-14 NOTE — Telephone Encounter (Signed)
 Labs sent over to Harlene Bill, NP at [facility] and pt notified.

## 2024-04-15 NOTE — Telephone Encounter (Signed)
 Pt notified labs has been faxed again and confirmation as been received.

## 2024-04-15 NOTE — Telephone Encounter (Signed)
 Copied from CRM #[id number]. Topic: General - Other >> Apr 14, 2024  4:54 PM Chiquita SQUIBB wrote: Patient is calling in stating that the office did not receive the lab work. Please refax to [phone]

## 2024-04-15 NOTE — Telephone Encounter (Signed)
 Copied from CRM #[id number]. Topic: General - Other >> Apr 14, 2024  4:54 PM Sandra Bishop wrote: Patient is calling in stating that the office did not receive the lab work. Please refax to [phone]

## 2024-04-24 ENCOUNTER — Encounter

## 2024-04-28 ENCOUNTER — Ambulatory Visit
Admission: RE | Admit: 2024-04-28 | Discharge: 2024-04-28 | Disposition: A | Source: Ambulatory Visit | Attending: Adult Health | Admitting: Adult Health

## 2024-04-28 ENCOUNTER — Other Ambulatory Visit: Payer: Self-pay | Admitting: Adult Health

## 2024-04-28 DIAGNOSIS — Z1231 Encounter for screening mammogram for malignant neoplasm of breast: Secondary | ICD-10-CM

## 2024-04-28 DIAGNOSIS — Z9889 Other specified postprocedural states: Secondary | ICD-10-CM

## 2024-04-29 ENCOUNTER — Telehealth: Payer: Self-pay

## 2024-04-29 NOTE — Telephone Encounter (Signed)
 Copied from CRM #[id number]. Topic: Clinical - Request for Lab/Test Order >> Apr 29, 2024 12:52 PM Rea C wrote: Reason for CRM: Patient went to breast center yesterday- said they may have seen something in left breast.  They want patient to come back in six months. Patient is requesting an MRI to have done before going back in six months.    [phone] (M) or MyChart

## 2024-04-30 ENCOUNTER — Other Ambulatory Visit: Payer: Self-pay | Admitting: Adult Health

## 2024-04-30 DIAGNOSIS — N63 Unspecified lump in unspecified breast: Secondary | ICD-10-CM

## 2024-04-30 NOTE — Telephone Encounter (Signed)
 Sent pt message to discuss with Oncologist.

## 2024-05-05 ENCOUNTER — Telehealth: Payer: Self-pay

## 2024-05-05 NOTE — Telephone Encounter (Signed)
 S/w patient regarding concerns with recent mammogram and ultrasound results.  Patient requesting to have MRI done of L breast d/t recent findings. Patient aware that recommendation had been to have follow-up diagnostic mammogram and possible ultrasound in 6 months. Patient reports that she has had changes and would not be comfortable waiting that long. Case discussed with Morna Kendall, NP, who would recommend having a telephone visit with patient to review results and next steps. Confirmed that patient is agreeable with telephone visit. Scheduled patient for telephone visit with Causey on 10/21 at 9:40 AM.

## 2024-05-06 ENCOUNTER — Inpatient Hospital Stay: Attending: Hematology and Oncology | Admitting: Adult Health

## 2024-05-06 DIAGNOSIS — C50411 Malignant neoplasm of upper-outer quadrant of right female breast: Secondary | ICD-10-CM

## 2024-05-06 DIAGNOSIS — Z9189 Other specified personal risk factors, not elsewhere classified: Secondary | ICD-10-CM

## 2024-05-06 DIAGNOSIS — Z170 Estrogen receptor positive status [ER+]: Secondary | ICD-10-CM

## 2024-05-06 NOTE — Progress Notes (Unsigned)
 [facility] Cancer Follow up:    Sandra Harlene CROME, NP [address], [address] [place] [address]   DIAGNOSIS: Cancer Staging  Malignant neoplasm of upper-outer quadrant of right breast in female, estrogen receptor positive (HCC) Staging form: Breast, AJCC 8th Edition - Clinical stage from 02/23/2021: Stage IIB (cT1c, cN1(f), cM0, G3, ER+, PR-, HER2-) - Signed by Odean Potts, MD on 02/23/2021 Stage prefix: Initial diagnosis Method of lymph node assessment: Core biopsy Histologic grading system: 3 grade system  I connected with Sandra Bishop on 05/06/24 at  9:40 AM EDT by telephone and verified that I am speaking with the correct person using two identifiers.  I discussed the limitations, risks, security and privacy concerns of performing an evaluation and management service by telephone and the availability of in person appointments.  I also discussed with the patient that there may be a patient responsible charge related to this service. The patient expressed understanding and agreed to proceed.  Patient location: home Provider location: [facility] office  SUMMARY OF ONCOLOGIC HISTORY: Oncology History  Malignant neoplasm of upper-outer quadrant of right breast in female, estrogen receptor positive (HCC)  02/15/2021 Initial Diagnosis   Screening mammogram: a possible mass in the right breast  Diagnostic mammogram and US : a persistent irregular mass 1.2 cm within the upper-outer right breast. 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    02/23/2021 Cancer Staging   Staging form: Breast, AJCC 8th Edition - Clinical stage from 02/23/2021: Stage IIB (cT1c, cN1(f), cM0, G3, ER+, PR-, HER2-) - Signed by Odean Potts, MD on 02/23/2021 Stage prefix: Initial diagnosis Method of lymph node assessment: Core biopsy Histologic grading system: 3 grade system   03/04/2021 - 07/01/2021 Neo-Adjuvant  Chemotherapy   Received four cycles of neoadjuvant Adriamycin  and Cytoxan  from 03/04/2021-04/14/2021.  Followed by 9 cycles of Taxol  from 04/28/2021-07/01/2021.  Taxol  was dose reduced beginning with cycle 6 due to restless legs and stopped after 9 cycles due to peripheral neuropathy.    03/10/2021 Genetic Testing   Negative genetic testing. No pathogenic variants identified on the [facility] CancerNext-Expanded+RNA panel. The report date is 03/10/2021.  The CancerNext-Expanded + RNAinsight gene panel offered by [employer] and includes sequencing and rearrangement analysis for the following 77 genes: IP, ALK, APC*, ATM*, AXIN2, BAP1, BARD1, BLM, BMPR1A, BRCA1*, BRCA2*, BRIP1*, CDC73, CDH1*,CDK4, CDKN1B, CDKN2A, CHEK2*, CTNNA1, DICER1, FANCC, FH, FLCN, GALNT12, KIF1B, LZTR1, MAX, MEN1, MET, MLH1*, MSH2*, MSH3, MSH6*, MUTYH*, NBN, NF1*, NF2, NTHL1, PALB2*, PHOX2B, PMS2*, POT1, PRKAR1A, PTCH1, PTEN*, RAD51C*, RAD51D*,RB1, RECQL, RET, SDHA, SDHAF2, SDHB, SDHC, SDHD, SMAD4, SMARCA4, SMARCB1, SMARCE1, STK11, SUFU, TMEM127, TP53*,TSC1, TSC2, VHL and XRCC2 (sequencing and deletion/duplication); EGFR, EGLN1, HOXB13, KIT, MITF, PDGFRA, POLD1 and POLE (sequencing only); EPCAM and GREM1 (deletion/duplication only).   09/20/2021 - 11/08/2021 Radiation Therapy   Site Technique Total Dose (Gy) Dose per Fx (Gy) Completed Fx Beam Energies  Breast, Right: Breast_R 3D 50.4/50.4 1.8 28/28 10X  Breast, Right: Breast_R_SCLV 3D 50.4/50.4 1.8 28/28 6X, 10X  Breast, Right: Breast_R_Bst 3D 10/10 2 5/5 6X, 10X     10/31/2021 -  Anti-estrogen oral therapy   Tamoxifen  x 2-5 years followed by switching to aromatase inhibitor once menopausal     CURRENT THERAPY:  INTERVAL HISTORY:  Discussed the use of AI scribe software for clinical note transcription with the patient, who gave verbal consent to proceed.  History of Present Illness Sandra Bishop is a  53 year old female with a history of breast cancer who presents with  concerns about breast pain and recent mammogram findings.  She underwent a regular mammogram followed by a 3D mammogram, during which the radiology team expressed uncertainty about the findings. She experiences sharp, intermittent pain on both sides, primarily in the left breast and underarm area, with no worsening over time. Soreness by her armpit is present but has not increased. She underwent guardant reveal testing in June that returned negative results. She has not noticed any swelling or significant changes in her symptoms.     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 07/22/2021   Hyperglycemia due to type 2 diabetes mellitus (HCC) 07/08/2021   Pure hypercholesterolemia 07/08/2021   Morbid obesity (HCC) 07/08/2021   Chemotherapy-induced peripheral neuropathy 07/08/2021   Encounter to establish care with new provider 03/17/2021   Family history of breast cancer 02/24/2021   Family history of pancreatic cancer 02/24/2021   Family history of colon cancer 02/24/2021   Family history of lung cancer 02/24/2021   Malignant neoplasm of upper-outer quadrant of right breast in female, estrogen receptor positive (HCC) 02/15/2021   Obesity    Constipation 08/30/2011    has no known allergies.  MEDICAL HISTORY: Past Medical History:  Diagnosis Date   Allergy    Anemia    Arthritis    Breast cancer (HCC)    Right   Breast cancer, right (HCC)    Constipation    Depression 2023/06/16   Son passed away   Diabetes mellitus without complication (HCC)    Family history of adverse reaction to anesthesia    Aunt is hard to wake up from general anesthesia   Family history of breast cancer    Family history of colon cancer    Family history of lung cancer     Family history of pancreatic cancer    History of migraine    during pregancy   HLD (hyperlipidemia)    Obesity    Palpitations     SURGICAL HISTORY: Past Surgical History:  Procedure Laterality Date   BREAST BIOPSY Right 02/11/2021   x2   BREAST BIOPSY Right 03/22/2021   x3   BREAST EXCISIONAL BIOPSY Left 02/2022   BREAST LUMPECTOMY Right 02/2022   BREAST LUMPECTOMY WITH RADIOACTIVE SEED AND SENTINEL LYMPH NODE BIOPSY Right 08/17/2021   Procedure: RIGHT BREAST LUMPECTOMY WITH RADIOACTIVE SEED AND SENTINEL LYMPH NODE BIOPSY;  Surgeon: Aron Shoulders, MD;  Location: [facility] OR;  Service: General;  Laterality: Right;   EYE SURGERY Right    PORT-A-CATH REMOVAL N/A 08/17/2021   Procedure: REMOVAL PORT-A-CATH;  Surgeon: Aron Shoulders, MD;  Location: [facility] OR;  Service: General;  Laterality: N/A;   PORTACATH PLACEMENT N/A 03/02/2021   Procedure: INSERTION PORT-A-CATH;  Surgeon: Aron Shoulders, MD;  Location: [facility] ORS;  Service: General;  Laterality: N/A;   RADIOACTIVE SEED GUIDED AXILLARY SENTINEL LYMPH NODE Right 08/17/2021   Procedure: RADIOACTIVE SEED GUIDED RIGHT SENTINEL LYMPH NODE EXCISION;  Surgeon: Aron Shoulders, MD;  Location: [facility] OR;  Service: General;  Laterality: Right;   RADIOACTIVE SEED GUIDED EXCISIONAL BREAST BIOPSY Left 08/17/2021   Procedure: RADIOACTIVE SEED GUIDED EXCISIONAL LEFT BREAST BIOPSY;  Surgeon: Aron Shoulders, MD;  Location: [facility] OR;  Service: General;  Laterality: Left;  TUBAL LIGATION      SOCIAL HISTORY: Social History   Socioeconomic History   Marital status: Divorced    Spouse name: Not on file   Number of children: 2   Years of education: Not on file   Highest education level: Associate degree: occupational, Scientist, product/process development, or vocational program  Occupational History   Not on file  Tobacco Use   Smoking status: Former    Current packs/day: 0.00    Types: Cigarettes    Quit date: 12/28/2010    Years since quitting: 13.3   Smokeless tobacco: Never   Tobacco  comments:    quit in 2012, previously smoked 15 years  Vaping Use   Vaping status: Not on file  Substance and Sexual Activity   Alcohol use: Not Currently    Comment: occ   Drug use: No   Sexual activity: Yes    Birth control/protection: Surgical  Other Topics Concern   Not on file  Social History Narrative   Son passed 05/2023   Social Drivers of Health   Financial Resource Strain: Medium Risk (03/27/2024)   Overall Financial Resource Strain (CARDIA)    Difficulty of Paying Living Expenses: Somewhat hard  Food Insecurity: Food Insecurity Present (03/27/2024)   Hunger Vital Sign    Worried About Running Out of Food in the Last Year: Often true    Ran Out of Food in the Last Year: Often true  Transportation Needs: No Transportation Needs (03/27/2024)   PRAPARE - Administrator, Civil Service (Medical): No    Lack of Transportation (Non-Medical): No  Physical Activity: Insufficiently Active (03/27/2024)   Exercise Vital Sign    Days of Exercise per Week: 7 days    Minutes of Exercise per Session: 20 min  Stress: Stress Concern Present (03/27/2024)   [employer] of Occupational Health - Occupational Stress Questionnaire    Feeling of Stress: To some extent  Social Connections: Moderately Integrated (03/27/2024)   Social Connection and Isolation Panel    Frequency of Communication with Friends and Family: More than three times a week    Frequency of Social Gatherings with Friends and Family: More than three times a week    Attends Religious Services: More than 4 times per year    Active Member of [employer] or Organizations: Yes    Attends Banker Meetings: More than 4 times per year    Marital Status: Widowed  Catering manager Violence: Not on file    FAMILY HISTORY: Family History  Problem Relation Age of Onset   Hypertension Mother    Pancreatic cancer Father 59   Breast cancer Half-Sister 40       negative genetic testing   Colon cancer Maternal  Uncle        dx >50   Lung cancer Maternal Uncle        dx 33s, hx smoking   Cancer Paternal Aunt        unknown type, dx early 74s   Heart failure Maternal Grandmother    Breast cancer Other 74       mother's first cousin   Cancer Sister     Review of Systems  Constitutional:  Negative for appetite change, chills, fatigue, fever and unexpected weight change.  HENT:   Negative for hearing loss, lump/mass and trouble swallowing.   Eyes:  Negative for eye problems and icterus.  Respiratory:  Negative for chest tightness, cough and shortness of breath.   Cardiovascular:  Negative  for chest pain, leg swelling and palpitations.  Gastrointestinal:  Negative for abdominal distention, abdominal pain, constipation, diarrhea, nausea and vomiting.  Endocrine: Negative for hot flashes.  Genitourinary:  Negative for difficulty urinating.   Musculoskeletal:  Negative for arthralgias.  Skin:  Negative for itching and rash.  Neurological:  Negative for dizziness, extremity weakness, headaches and numbness.  Hematological:  Negative for adenopathy. Does not bruise/bleed easily.  Psychiatric/Behavioral:  Negative for depression. The patient is not nervous/anxious.       PHYSICAL EXAMINATION  Patient sounds well, in no apparent distress.  Mood and behavior are normal.  Speech is normal.  Breathing is non labored.   LABORATORY DATA:  CBC    Component Value Date/Time   WBC 7.5 03/28/2024 1521   RBC 4.33 03/28/2024 1521   HGB 11.8 03/28/2024 1521   HGB 11.9 (L) 12/05/2023 1130   HGB 11.5 08/11/2020 1654   HCT 36.7 03/28/2024 1521   HCT 35.9 08/11/2020 1654   PLT 238 03/28/2024 1521   PLT 229 12/05/2023 1130   PLT 330 10/06/2019 1516   MCV 84.8 03/28/2024 1521   MCV 81 08/11/2020 1654   MCH 27.3 03/28/2024 1521   MCHC 32.2 03/28/2024 1521   RDW 13.6 03/28/2024 1521   RDW 14.1 08/11/2020 1654   LYMPHSABS 1.5 12/05/2023 1130   LYMPHSABS 2.9 08/11/2020 1654   MONOABS 0.7 12/05/2023 1130    EOSABS 60 03/28/2024 1521   EOSABS 0.2 08/11/2020 1654   BASOSABS 23 03/28/2024 1521   BASOSABS 0.0 08/11/2020 1654    CMP     Component Value Date/Time   NA 137 03/28/2024 1521   NA 140 08/11/2020 1654   K 4.0 03/28/2024 1521   CL 104 03/28/2024 1521   CO2 24 03/28/2024 1521   GLUCOSE 99 03/28/2024 1521   BUN 14 03/28/2024 1521   BUN 12 08/11/2020 1654   CREATININE 0.76 03/28/2024 1521   CALCIUM  9.1 03/28/2024 1521   PROT 7.4 03/28/2024 1521   PROT 8.0 08/11/2020 1654   ALBUMIN 4.1 12/05/2023 1130   ALBUMIN 4.2 08/11/2020 1654   AST 12 03/28/2024 1521   AST 13 (L) 12/05/2023 1130   ALT 14 03/28/2024 1521   ALT 16 12/05/2023 1130   ALKPHOS 68 12/05/2023 1130   BILITOT 0.3 03/28/2024 1521   BILITOT 0.3 12/05/2023 1130   GFRNONAA >60 12/05/2023 1130   GFRAA 100 07/26/2023 0835     ASSESSMENT and THERAPY PLAN:   No problem-specific Assessment & Plan notes found for this encounter.  Assessment and Plan Assessment & Plan Left breast pain and axillary discomfort Persistent left breast and axillary soreness with intermittent sharp pain. Previous imaging showed calcifications; ultrasound was unremarkable. Further evaluation is warranted to determine the cause of symptoms. - Order breast MRI for further evaluation. - Coordinate insurance prior authorization for MRI. - Informed her MRI may lead to biopsy recommendations if findings are suspicious.  Right breast cancer Previous right breast cancer with interventions. Current left breast symptoms not directly related but require investigation due to history. - Discuss potential biopsy if MRI findings are suspicious.  RTC after MRI to review results.      The patient was provided an opportunity to ask questions and all were answered. The patient agreed with the plan and demonstrated an understanding of the instructions.   The patient was advised to call back or seek an in-person evaluation if the symptoms worsen or if  the condition fails to improve as anticipated.  I provided 10 minutes of non face-to-face telephone visit time during this encounter, and > 50% was spent counseling as documented under my assessment & plan.     Morna Kendall, NP 05/06/24 10:26 AM Medical Oncology and Hematology [facility] [address] [place], [place] [address] Tel. [phone]    Fax. [phone]  *Total Encounter Time as defined by the Centers for Medicare and Medicaid Services includes, in addition to the face-to-face time of a patient visit (documented in the note above) non-face-to-face time: obtaining and reviewing outside history, ordering and reviewing medications, tests or procedures, care coordination (communications with other health care professionals or caregivers) and documentation in the medical record.

## 2024-05-07 ENCOUNTER — Encounter: Payer: Self-pay | Admitting: Adult Health

## 2024-05-08 ENCOUNTER — Ambulatory Visit
Admission: RE | Admit: 2024-05-08 | Discharge: 2024-05-08 | Disposition: A | Discharge status: Home / Self Care | Source: Ambulatory Visit | Attending: Adult Health | Admitting: Adult Health

## 2024-05-08 DIAGNOSIS — Z170 Estrogen receptor positive status [ER+]: Secondary | ICD-10-CM

## 2024-05-08 DIAGNOSIS — Z9189 Other specified personal risk factors, not elsewhere classified: Secondary | ICD-10-CM

## 2024-05-08 MED ORDER — GADOPICLENOL 0.5 MMOL/ML IV SOLN
9.0000 mL | Freq: Once | INTRAVENOUS | Status: AC | PRN
Start: 2024-05-08 — End: 2024-05-08
  Administered 2024-05-08: 9 mL via INTRAVENOUS

## 2024-05-09 ENCOUNTER — Ambulatory Visit: Payer: Self-pay

## 2024-05-09 ENCOUNTER — Inpatient Hospital Stay: Admitting: Adult Health

## 2024-05-15 ENCOUNTER — Ambulatory Visit: Payer: Self-pay

## 2024-05-15 ENCOUNTER — Telehealth: Payer: Self-pay | Admitting: *Deleted

## 2024-05-15 NOTE — Telephone Encounter (Signed)
 FYI Only or Action Required?: FYI only for provider: appointment scheduled on 10/31.  Patient was last seen in primary care on 03/28/2024 by Wheeler Harlene CROME, NP.  Called Nurse Triage reporting Eye Problem.  Symptoms began yesterday.  Interventions attempted: OTC medications: Nyquil.  Symptoms are: stable.  Triage Disposition: See Physician Within 24 Hours  Patient/caregiver understands and will follow disposition?: Yes       Copied from CRM #[id number]. Topic: General - Other >> May 15, 2024  3:10 PM Aisha D wrote: Reason for CRM: Pt stated that she is seeing floaters in her eyes and wants to know if she needs an appt or what Harlene Wheeler CARROLYN blair. Pt stated that she was seeing a lot earlier but they seem faded now. Pt would like a callback with an update. >> May 15, 2024  3:52 PM China J wrote: Patient calling back to let us  know that she thinks this is because of a sinus infection. She started having the floaters when a sinus headache hit her. She is also congested and would like to come in for an appointment. As of now, she is still having the floaters occur along with head pain due to her headache. Reason for Disposition  Eye pain present > 24 hours  Answer Assessment - Initial Assessment Questions 1. ONSET: When did the pain start? (e.g., minutes, hours, days)     This AM 2. TIMING: Does the pain come and go, or has it been constant since it started? (e.g., constant, intermittent, fleeting)     Intermittent, no pain, eye floaters 3. SEVERITY: How bad is the pain?  (Scale 1-10; mild, moderate or severe)     HA, around eyes  4. LOCATION: Where does it hurt?  (e.g., eyelid, eye, cheekbone)     Eye, behind eyes 5. CAUSE: What do you think is causing the pain?     Related to HA pt believes 6. VISION: Do you have blurred vision or changes in your vision?      None 8. FEVER: Do you have a fever? If Yes, ask: What is it, how was it measured, and when did  it start?      None 9. OTHER SYMPTOMS: Do you have any other symptoms? (e.g., headache, nasal discharge, facial rash)     HA, nasal congestion, dry cough  Protocols used: Eye Pain and Other Symptoms-A-AH

## 2024-05-15 NOTE — Telephone Encounter (Signed)
 Appt scheduled

## 2024-05-15 NOTE — Telephone Encounter (Signed)
 Spoke with pt and advised that she reach out to her eye doctor and to schedule an appointment with them/

## 2024-05-15 NOTE — Telephone Encounter (Signed)
 Copied from CRM [id number]. Topic: General - Other >> May 15, 2024  3:10 PM Aisha D wrote: Reason for CRM: Pt stated that she is seeing floaters in her eyes and wants to know if she needs an appt or what Harlene Wheeler CARROLYN blair. Pt stated that she was seeing a lot earlier but they seem faded now. Pt would like a callback with an update.

## 2024-05-16 ENCOUNTER — Ambulatory Visit: Admitting: Student

## 2024-05-16 VITALS — BP 120/82 | HR 91 | Temp 98.2°F | Ht 64.0 in | Wt 208.6 lb

## 2024-05-16 DIAGNOSIS — K5900 Constipation, unspecified: Secondary | ICD-10-CM

## 2024-05-16 DIAGNOSIS — T7840XS Allergy, unspecified, sequela: Secondary | ICD-10-CM | POA: Diagnosis not present

## 2024-05-16 DIAGNOSIS — H43399 Other vitreous opacities, unspecified eye: Secondary | ICD-10-CM | POA: Diagnosis not present

## 2024-05-16 MED ORDER — LORATADINE 10 MG PO TABS: 10.0000 mg | ORAL_TABLET | Freq: Every day | ORAL | 0 refills | Status: AC

## 2024-05-16 MED ORDER — METFORMIN HCL 500 MG PO TABS: 500.0000 mg | ORAL_TABLET | Freq: Two times a day (BID) | ORAL | 3 refills | Status: AC

## 2024-05-16 MED ORDER — FLUTICASONE PROPIONATE 50 MCG/ACT NA SUSP: 2.0000 | Freq: Every day | NASAL | 6 refills | Status: AC

## 2024-05-16 NOTE — Progress Notes (Signed)
 Chief Complaint  Patient presents with   Sinus Problem    Onset 2 weeks  I have been taking OTC medication, it helped a little bit. I woke up this morning and noticed I was seeing floaters and I had a pressure headache for 3 days. I still have a slight headache.     Sandra Bishop Sar here for URI complaints.  History of Present Illness Sandra Bishop is a 53 year old female who presents with new onset of floaters and sinus congestion.  She experiences floaters in her vision since yesterday, more prominent in one eye, now reduced to a speck. No vision loss is present.  Nasal congestion has persisted for a couple of weeks. There is no fever or cough. She suspects her work environment, with temperature fluctuations and possible mold exposure, might contribute to her symptoms. She is not currently taking any allergy medications, has taken in the past. Denies sick contacts.   Her diabetes management includes Jardiance , glipizide , and metformin  500 mg ER, adjusted based on blood sugar levels. Follows with endocrinology. She uses a probiotic for constipation. She is no longer on Mounjaro. She denies shortness of breath and fever but feels tired.  Patient denies fever, chills, SOB, CP, palpitations, dyspnea, edema, HA, N/V/D, abdominal pain, urinary symptoms, rash, weight changes, and recent illness or hospitalizations.    Past Medical History:  Diagnosis Date   Allergy    Anemia    Arthritis    Breast cancer (HCC)    Right   Breast cancer, right (HCC)    Constipation    Depression June 14, 2023   Son passed away   Diabetes mellitus without complication (HCC)    Family history of adverse reaction to anesthesia    Aunt is hard to wake up from general anesthesia   Family history of breast cancer    Family history of colon cancer    Family history of lung cancer    Family history of pancreatic cancer    History of migraine    during pregancy   HLD (hyperlipidemia)    Obesity     Palpitations     Objective BP 120/82 (BP Location: Left Arm, Patient Position: Sitting, Cuff Size: Normal)   Pulse 91   Temp 98.2 F (36.8 C) (Oral)   Ht 5' 4 (1.626 m)   Wt 208 lb 9.6 oz (94.6 kg)   SpO2 98%   BMI 35.81 kg/m  General: Awake, alert,appears stated age HEENT: AT, [place], pharynx pink and without exudates, MMM Neck: No masses or asymmetry Heart: RRR Lungs: CTAB, no accessory muscle use Psych: Age appropriate judgment and insight, normal mood and affect  Vitreous floaters, unspecified laterality - Plan: Ambulatory referral to Ophthalmology  Allergy, sequela - Plan: loratadine (CLARITIN) 10 MG tablet, fluticasone  (FLONASE ) 50 MCG/ACT nasal spray  Constipation, unspecified constipation type   Eye floaters Recent onset of floaters, more prominent in one eye. Likely age-related or diabetes-related. Differential includes retinal issues or pressure changes. - Refer to ophthalmology for dilated eye exam to assess retina and intraocular pressure.  Nasal congestion, allergies Intermittent nasal congestion likely due to environmental factors. Differential includes allergies or viral infection. - Start Claritin for allergy management. - Consider Flonase  nasal spray, one spray in each nostril daily. - Consider Astelin nasal spray if Flonase  not tolerated.  Type 2 diabetes mellitus Well-controlled diabetes with good recent A1c. On Jardiance  and glipizide  as needed. Metformin  taken once daily based on blood sugar levels. - Continue current diabetes  medications as prescribed. - Monitor blood sugar levels and adjust glipizide  and metformin  intake based on readings. - Follow up with endocrinology  Constipation Chronic constipation.  - Continue probiotic supplementation. - Increase hydration, at least 64 oz daily - Consider fiber supplementation with Benefiber or fiber gummies.  Continue to push fluids, practice good hand hygiene, cover mouth when coughing. F/u prn. If  starting to experience fevers, shaking, or shortness of breath, seek immediate care. Pt voiced understanding and agreement to the plan.  Sandra LITTIE Jolly, DNP, AGNP-C 05/16/24 3:23 PM

## 2024-06-09 LAB — OPHTHALMOLOGY REPORT-SCANNED

## 2024-06-10 ENCOUNTER — Telehealth: Payer: Self-pay

## 2024-06-10 NOTE — Telephone Encounter (Signed)
 Patient called regarding the Guardant Reveal test. The significance of the testing was explained to her. Patient verbalized understanding and agreed to continue if this remains part of the treatment plan.

## 2024-06-17 ENCOUNTER — Ambulatory Visit: Payer: Self-pay

## 2024-06-17 NOTE — Telephone Encounter (Signed)
 FYI Only or Action Required?: Action required by provider: request for appointment.  Patient was last seen in primary care on 05/16/2024 by Sandra Harlene CROME, NP.  Called Nurse Triage reporting Dizziness.  Symptoms began several days ago.  Interventions attempted: Nothing.  Symptoms are: unchanged. Feels dizzy after eating. No nausea. I'm still having headaches. No other symptoms.  Triage Disposition: See Physician Within 24 Hours  Patient/caregiver understands and will follow disposition?: Yes     Copied from CRM #[id number]. Topic: Clinical - Red Word Triage >> Jun 17, 2024  2:35 PM Montie POUR wrote: Red Word that prompted transfer to Nurse Triage:  This has been going on for a few weeks. She saw primary doctor on 05/16/24. She has went to an eye doctor and everything is okay. She is still getting dizzy after she eats and getting worse. Reason for Disposition  [1] MODERATE dizziness (e.g., interferes with normal activities) AND [2] has NOT been evaluated by doctor (or NP/PA) for this  (Exception: Dizziness caused by heat exposure, sudden standing, or poor fluid intake.)  Answer Assessment - Initial Assessment Questions 1. DESCRIPTION: Describe your dizziness.     Dizzy, faint 2. LIGHTHEADED: Do you feel lightheaded? (e.g., somewhat faint, woozy, weak upon standing)     woozy 3. VERTIGO: Do you feel like either you or the room is spinning or tilting? (i.e., vertigo)     no 4. SEVERITY: How bad is it?  Do you feel like you are going to faint? Can you stand and walk?     yes 5. ONSET:  When did the dizziness begin?     2 days ago 6. AGGRAVATING FACTORS: Does anything make it worse? (e.g., standing, change in head position)     After eating 7. HEART RATE: Can you tell me your heart rate? How many beats in 15 seconds?  (Note: Not all patients can do this.)       no 8. CAUSE: What do you think is causing the dizziness? (e.g., decreased fluids or food,  diarrhea, emotional distress, heat exposure, new medicine, sudden standing, vomiting; unknown)     unsure 9. RECURRENT SYMPTOM: Have you had dizziness before? If Yes, ask: When was the last time? What happened that time?     no 10. OTHER SYMPTOMS: Do you have any other symptoms? (e.g., fever, chest pain, vomiting, diarrhea, bleeding)       Palpitations, eye floaters  11. PREGNANCY: Is there any chance you are pregnant? When was your last menstrual period?       no  Protocols used: Dizziness - Lightheadedness-A-AH

## 2024-06-20 ENCOUNTER — Ambulatory Visit: Admitting: Student

## 2024-06-26 NOTE — Assessment & Plan Note (Signed)
 Encouraged DASH or MIND diet, decrease po intake and increase exercise as tolerated. Needs 7-8 hours of sleep nightly. Avoid trans fats, eat small, frequent meals every 4-5 hours with lean proteins, complex carbs and healthy fats. Minimize simple carbs, high fat foods and processed foods

## 2024-06-26 NOTE — Assessment & Plan Note (Signed)
 Supplement and monitor

## 2024-06-26 NOTE — Progress Notes (Unsigned)
 Subjective:     Sandra Bishop ID: Sandra Bishop, female    DOB: October 26, 1970, 53 y.o.   MRN: [id number]  No chief complaint on file.   HPI  Discussed the use of AI scribe software for clinical note transcription with the Sandra Bishop, who gave verbal consent to proceed.  History of Present Illness      Follows with Oncology, Endocrinology  PMHx- Hx Breast Cancer  Recent MRI WNL     Type 2 DM- Jardiance  25 mg, Glipizide ?, Metformin  500 mg BID   HLD-rosuvastatin  5 mg once per week     Sandra Bishop denies fever, chills, SOB, CP, palpitations, dyspnea, edema, HA, vision changes, N/V/D, abdominal pain, urinary symptoms, rash, weight changes, and recent illness or hospitalizations.      Health Maintenance Due  Topic Date Due   Hepatitis B Vaccines 19-59 Average Risk (1 of 3 - 19+ 3-dose series) Never done   Zoster Vaccines- Shingrix (2 of 2) 04/10/2022    Past Medical History:  Diagnosis Date   Allergy    Anemia    Arthritis    Breast cancer (HCC)    Right   Breast cancer, right (HCC)    Constipation    Depression 06/20/2023   Son passed away   Diabetes mellitus without complication (HCC)    Family history of adverse reaction to anesthesia    Aunt is hard to wake up from general anesthesia   Family history of breast cancer    Family history of colon cancer    Family history of lung cancer    Family history of pancreatic cancer    History of migraine    during pregancy   HLD (hyperlipidemia)    Obesity    Palpitations     Past Surgical History:  Procedure Laterality Date   BREAST BIOPSY Right 02/11/2021   x2   BREAST BIOPSY Right 03/22/2021   x3   BREAST EXCISIONAL BIOPSY Left 02/2022   BREAST LUMPECTOMY Right 02/2022   BREAST LUMPECTOMY WITH RADIOACTIVE SEED AND SENTINEL LYMPH NODE BIOPSY Right 08/17/2021   Procedure: RIGHT BREAST LUMPECTOMY WITH RADIOACTIVE SEED AND SENTINEL LYMPH NODE BIOPSY;  Surgeon: Aron Shoulders, MD;  Location: [facility];  Service: General;   Laterality: Right;   EYE SURGERY Right    PORT-A-CATH REMOVAL N/A 08/17/2021   Procedure: REMOVAL PORT-A-CATH;  Surgeon: Aron Shoulders, MD;  Location: [facility] OR;  Service: General;  Laterality: N/A;   PORTACATH PLACEMENT N/A 03/02/2021   Procedure: INSERTION PORT-A-CATH;  Surgeon: Aron Shoulders, MD;  Location: [facility];  Service: General;  Laterality: N/A;   RADIOACTIVE SEED GUIDED AXILLARY SENTINEL LYMPH NODE Right 08/17/2021   Procedure: RADIOACTIVE SEED GUIDED RIGHT SENTINEL LYMPH NODE EXCISION;  Surgeon: Aron Shoulders, MD;  Location: [facility] OR;  Service: General;  Laterality: Right;   RADIOACTIVE SEED GUIDED EXCISIONAL BREAST BIOPSY Left 08/17/2021   Procedure: RADIOACTIVE SEED GUIDED EXCISIONAL LEFT BREAST BIOPSY;  Surgeon: Aron Shoulders, MD;  Location: [facility];  Service: General;  Laterality: Left;   TUBAL LIGATION      Family History  Problem Relation Age of Onset   Hypertension Mother    Pancreatic cancer Father 25   Breast cancer Half-Sister 50       negative genetic testing   Colon cancer Maternal Uncle        dx >50   Lung cancer Maternal Uncle        dx 60s, hx smoking   Cancer Paternal Aunt  unknown type, dx early 8s   Heart failure Maternal Grandmother    Breast cancer Other 96       mother's first cousin   Cancer Sister     Social History   Socioeconomic History   Marital status: Divorced    Spouse name: Not on file   Number of children: 2   Years of education: Not on file   Highest education level: GED or equivalent  Occupational History   Not on file  Tobacco Use   Smoking status: Former    Current packs/day: 0.00    Types: Cigarettes    Quit date: 12/28/2010    Years since quitting: 13.5   Smokeless tobacco: Never   Tobacco comments:    quit in 2012, previously smoked 15 years  Vaping Use   Vaping status: Not on file  Substance and Sexual Activity   Alcohol use: Not Currently    Comment: occ   Drug use: No   Sexual activity: Yes    Birth  control/protection: Surgical  Other Topics Concern   Not on file  Social History Narrative   Son passed 05/2023   Social Drivers of Health   Tobacco Use: Medium Risk (05/16/2024)   Sandra Bishop History    Smoking Tobacco Use: Former    Smokeless Tobacco Use: Never    Passive Exposure: Not on Actuary Strain: Low Risk (05/16/2024)   Overall Financial Resource Strain (CARDIA)    Difficulty of Paying Living Expenses: Not very hard  Recent Concern: Financial Resource Strain - Medium Risk (03/27/2024)   Overall Financial Resource Strain (CARDIA)    Difficulty of Paying Living Expenses: Somewhat hard  Food Insecurity: No Food Insecurity (05/16/2024)   Epic    Worried About Programme Researcher, Broadcasting/film/video in the Last Year: Never true    Ran Out of Food in the Last Year: Never true  Recent Concern: Food Insecurity - Food Insecurity Present (03/27/2024)   Epic    Worried About Programme Researcher, Broadcasting/film/video in the Last Year: Often true    Ran Out of Food in the Last Year: Often true  Transportation Needs: No Transportation Needs (05/16/2024)   Epic    Lack of Transportation (Medical): No    Lack of Transportation (Non-Medical): No  Physical Activity: Insufficiently Active (05/16/2024)   Exercise Vital Sign    Days of Exercise per Week: 5 days    Minutes of Exercise per Session: 20 min  Stress: Stress Concern Present (05/16/2024)   [employer] of Occupational Health - Occupational Stress Questionnaire    Feeling of Stress: Very much  Social Connections: Moderately Integrated (05/16/2024)   Social Connection and Isolation Panel    Frequency of Communication with Friends and Family: More than three times a week    Frequency of Social Gatherings with Friends and Family: More than three times a week    Attends Religious Services: More than 4 times per year    Active Member of Clubs or Organizations: Yes    Attends Banker Meetings: More than 4 times per year    Marital Status:  Divorced  Intimate Partner Violence: Not on file  Depression (PHQ2-9): Medium Risk (03/28/2024)   Depression (PHQ2-9)    PHQ-2 Score: 5  Alcohol Screen: Low Risk (05/16/2024)   Alcohol Screen    Last Alcohol Screening Score (AUDIT): 1  Housing: Unknown (05/16/2024)   Epic    Unable to Pay for Housing in the Last Year: No  Number of Times Moved in the Last Year: Not on file    Homeless in the Last Year: No  Utilities: Not on file  Health Literacy: Not on file    Outpatient Medications Prior to Visit  Medication Sig Dispense Refill   ascorbic acid  (VITAMIN C) 500 MG tablet Take 1 tablet (500 mg total) by mouth daily.     aspirin  EC 81 MG tablet Take 81 mg by mouth daily. Swallow whole.     blood glucose meter kit and supplies Dispense based on Sandra Bishop and insurance preference. Use up to four times daily as directed. (FOR ICD-10 E10.9, E11.9).  ( One-Touch Verio meter) 1 each 0   Cholecalciferol (VITAMIN D3) 50 MCG (2000 UT) TABS Take 2,000 Units by mouth daily.     empagliflozin  (JARDIANCE ) 25 MG TABS tablet Take 1 tablet (25 mg total) by mouth daily before breakfast. 30 tablet 2   fluticasone  (FLONASE ) 50 MCG/ACT nasal spray Place 2 sprays into both nostrils daily. 16 g 6   glipiZIDE  (GLUCOTROL  XL) 2.5 MG 24 hr tablet Take 2.5 mg by mouth as needed.     Lancets (ONETOUCH ULTRASOFT) lancets Use to check blood glucose three times daily. Use as instructed. E11.9. 100 each 12   loratadine  (CLARITIN ) 10 MG tablet Take 1 tablet (10 mg total) by mouth daily. 90 tablet 0   metFORMIN  (GLUCOPHAGE ) 500 MG tablet Take 1 tablet (500 mg total) by mouth 2 (two) times daily with a meal. 180 tablet 3   ONETOUCH VERIO test strip TEST FOUR TIMES DAILY AS DIRECTED 100 strip 2   rosuvastatin  (CRESTOR ) 5 MG tablet Take 5 mg by mouth every Tuesday.     tamoxifen  (NOLVADEX ) 20 MG tablet TAKE 1 TABLET(20 MG) BY MOUTH DAILY 90 tablet 3   No facility-administered medications prior to visit.     Allergies[1]  ROS     Objective:    Physical Exam Vitals reviewed.  Constitutional:      General: Sandra Bishop.    Appearance: Sandra Bishop. Sandra Bishop is not toxic-appearing.  HENT:     Head: Normocephalic and atraumatic.     Mouth/Throat:     Mouth: Mucous membranes are moist.     Pharynx: Oropharynx is clear.  Eyes:     Extraocular Movements: Extraocular movements intact.     Pupils: Pupils are equal, round, and reactive to light.  Cardiovascular:     Rate and Rhythm: Normal rate and regular rhythm.     Pulses: Normal pulses.     Heart sounds: Normal heart sounds. No murmur heard. Pulmonary:     Effort: Pulmonary effort is normal. No respiratory Bishop.     Breath sounds: Normal breath sounds. No wheezing.  Musculoskeletal:        General: No swelling.     Cervical back: Neck supple.  Skin:    General: Skin is warm and dry.  Neurological:     General: No focal deficit present.     Mental Status: Sandra Bishop is alert and oriented to person, place, and time.  Psychiatric:        Mood and Affect: Mood normal.        Behavior: Behavior normal.        Thought Content: Thought content normal.        Judgment: Judgment normal.      There were no vitals taken for this visit. Wt Readings from Last 3 Encounters:  05/16/24 208 lb 9.6 oz (  94.6 kg)  04/10/24 208 lb 4.8 oz (94.5 kg)  03/28/24 208 lb 9.6 oz (94.6 kg)       Assessment & Plan:   Problem List Items Addressed This Visit       Endocrine   Hyperglycemia due to type 2 diabetes mellitus (HCC) - Primary   hgba1c acceptable, minimize simple carbs. Increase exercise as tolerated. Continue current meds. Following with endocrinology On stain Check A1C today Fu 6 months        Other   Malignant neoplasm of upper-outer quadrant of right breast in female, estrogen receptor positive (HCC)   Morbid obesity (HCC)   Encouraged DASH or MIND diet, decrease po intake and increase exercise as tolerated.  Needs 7-8 hours of sleep nightly. Avoid trans fats, eat small, frequent meals every 4-5 hours with lean proteins, complex carbs and healthy fats. Minimize simple carbs, high fat foods and processed foods        Vitamin D  deficiency   Supplement and monitor       Breast Cancer Following with Dr. Odean Potts, MD. On Tamoxifen . Recent MRI WNL. Clincial trial?   Eye floaters Recent onset of floaters, more prominent in one eye. Likely age-related or diabetes-related. Differential includes retinal issues or pressure changes. - Refer to ophthalmology for dilated eye exam to assess retina and intraocular pressure.  Nasal congestion, allergies Intermittent nasal congestion likely due to environmental factors. Differential includes allergies or viral infection. - Start Claritin  for allergy management. - Consider Flonase  nasal spray, one spray in each nostril daily. - Consider Astelin nasal spray if Flonase  not tolerated.   I am having Taryne C. Rineer maintain Sandra Bishop onetouch ultrasoft, blood glucose meter kit and supplies, OneTouch Verio, rosuvastatin , Vitamin D3, aspirin  EC, ascorbic acid , tamoxifen , empagliflozin , glipiZIDE , loratadine , fluticasone , and metFORMIN .  No orders of the defined types were placed in this encounter.      [1] No Known Allergies

## 2024-06-26 NOTE — Assessment & Plan Note (Signed)
 hgba1c acceptable, minimize simple carbs. Increase exercise as tolerated. Continue current meds. Following with endocrinology On stain Check A1C today Fu 6 months

## 2024-06-27 ENCOUNTER — Encounter: Payer: Self-pay | Admitting: Student

## 2024-06-27 ENCOUNTER — Ambulatory Visit: Admitting: Student

## 2024-06-27 VITALS — BP 132/83 | HR 87 | Ht 64.0 in | Wt 212.0 lb

## 2024-06-27 DIAGNOSIS — C50411 Malignant neoplasm of upper-outer quadrant of right female breast: Secondary | ICD-10-CM

## 2024-06-27 DIAGNOSIS — E559 Vitamin D deficiency, unspecified: Secondary | ICD-10-CM

## 2024-06-27 DIAGNOSIS — Z23 Encounter for immunization: Secondary | ICD-10-CM

## 2024-06-27 DIAGNOSIS — E1165 Type 2 diabetes mellitus with hyperglycemia: Secondary | ICD-10-CM

## 2024-06-27 DIAGNOSIS — T7840XA Allergy, unspecified, initial encounter: Secondary | ICD-10-CM | POA: Insufficient documentation

## 2024-06-27 DIAGNOSIS — R42 Dizziness and giddiness: Secondary | ICD-10-CM | POA: Insufficient documentation

## 2024-06-27 DIAGNOSIS — T7840XS Allergy, unspecified, sequela: Secondary | ICD-10-CM

## 2024-06-27 NOTE — Assessment & Plan Note (Signed)
 Chronic sinus symptoms with dizziness possibly related to sinus issues. - Advised to resume loratadine  or try Zyrtec OTC - Continue Flonase  daily. - Consider ENT referral if symptoms persist.

## 2024-06-27 NOTE — Assessment & Plan Note (Addendum)
 Intermittent dizziness - Referred to cardiology for further evaluation  - Ordered EKG today. -Check TSH, CBC, CMP, - Advised to minimize caffeine intake.

## 2024-06-28 LAB — COMPREHENSIVE METABOLIC PANEL WITH GFR
AG Ratio: 1.1 (calc) (ref 1.0–2.5)
ALT: 13 U/L (ref 6–29)
AST: 11 U/L (ref 10–35)
Albumin: 3.9 g/dL (ref 3.6–5.1)
Alkaline phosphatase (APISO): 65 U/L (ref 37–153)
BUN: 15 mg/dL (ref 7–25)
CO2: 24 mmol/L (ref 20–32)
Calcium: 9.4 mg/dL (ref 8.6–10.4)
Chloride: 106 mmol/L (ref 98–110)
Creat: 0.77 mg/dL (ref 0.50–1.03)
Globulin: 3.6 g/dL (ref 1.9–3.7)
Glucose, Bld: 115 mg/dL — ABNORMAL HIGH (ref 65–99)
Potassium: 4 mmol/L (ref 3.5–5.3)
Sodium: 140 mmol/L (ref 135–146)
Total Bilirubin: 0.3 mg/dL (ref 0.2–1.2)
Total Protein: 7.5 g/dL (ref 6.1–8.1)
eGFR: 92 mL/min/1.73m2 (ref >=60)

## 2024-06-28 LAB — CBC
HCT: 36.4 % (ref 35.9–46.0)
Hemoglobin: 11.7 g/dL (ref 11.7–15.5)
MCH: 27.1 pg (ref 27.0–33.0)
MCHC: 32.1 g/dL (ref 31.6–35.4)
MCV: 84.5 fL (ref 81.4–101.7)
MPV: 10.7 fL (ref 7.5–12.5)
Platelets: 220 Thousand/uL (ref 140–400)
RBC: 4.31 Million/uL (ref 3.80–5.10)
RDW: 13.7 % (ref 11.0–15.0)
WBC: 7.1 Thousand/uL (ref 3.8–10.8)

## 2024-06-28 LAB — IRON,TIBC AND FERRITIN PANEL
%SAT: 17 % (ref 16–45)
Ferritin: 163 ng/mL (ref 16–232)
Iron: 57 ug/dL (ref 45–160)
TIBC: 332 ug/dL (ref 250–450)

## 2024-06-28 LAB — HEMOGLOBIN A1C
Hgb A1c MFr Bld: 7.8 % — ABNORMAL HIGH (ref <5.7)
Mean Plasma Glucose: 177 mg/dL
eAG (mmol/L): 9.8 mmol/L

## 2024-06-28 LAB — TSH: TSH: 0.58 m[IU]/L

## 2024-06-28 LAB — MAGNESIUM: Magnesium: 2.1 mg/dL (ref 1.5–2.5)

## 2024-06-30 ENCOUNTER — Ambulatory Visit: Payer: Self-pay | Admitting: Student

## 2024-07-01 ENCOUNTER — Encounter: Payer: Self-pay | Admitting: Adult Health

## 2024-07-15 NOTE — Progress Notes (Signed)
 " Cardiology Office Note:   Date:  07/15/2024  ID:  Sandra Bishop, DOB 01-19-1971, MRN [id number] PCP: Wheeler Harlene CROME, NP  [facility] Providers Cardiologist:  None { Chief Complaint: No chief complaint on file.     History of Present Illness:   Sandra Bishop is a 53 y.o. female with a PMH of DM2, HLD, R breast CA s/p chemotherapy who presents as a new patient referral by Harlene Wheeler for evaluation of dizziness and palpitations.  Previously seen by cardiology twice in the past for symptoms of palpitations most recently in 2020.  Underwent an ETT which was normal.  Additionally, multiple Holter monitors and complete echocardiograms were also unremarkable.  She has not been seen by cardiology since.   Past Medical History:  Diagnosis Date   Allergy    Anemia    Arthritis    Breast cancer (HCC)    Right   Breast cancer, right (HCC)    Constipation    Depression 2023-06-11   Son passed away   Diabetes mellitus without complication [facility])    Family history of adverse reaction to anesthesia    Aunt is hard to wake up from general anesthesia   Family history of breast cancer    Family history of colon cancer    Family history of lung cancer    Family history of pancreatic cancer    History of migraine    during pregancy   HLD (hyperlipidemia)    Obesity    Palpitations      Studies Reviewed:    EKG: ***       Cardiac Studies & Procedures   ______________________________________________________________________________________________   STRESS TESTS  EXERCISE TOLERANCE TEST (ETT) 10/06/2015  Interpretation Summary  There was no ST segment deviation noted during stress.  No T wave inversion was noted during stress.  Blood pressure demonstrated a hypertensive response to exercise.  Mildly impaired exercise tolerance and rapid heart rate increase with exercise. Hypertensive response to exercise. Normal ECG response to exercise.    ECHOCARDIOGRAM  ECHOCARDIOGRAM COMPLETE 03/02/2021  Narrative ECHOCARDIOGRAM REPORT    Patient Name:   Sandra Bishop Date of Exam: 03/02/2021 Medical Rec #:  [id number]        Height:       61.5 in Accession #:    [id number]       Weight:       235.0 lb Date of Birth:  07-23-1970       BSA:          2.035 m Patient Age:    49 years         BP:           154/83 mmHg Patient Gender: F                HR:           88 bpm. Exam Location:  Outpatient  Procedure: 2D Echo, Cardiac Doppler, Color Doppler and Strain Analysis  Indications:     Chemo Z09  History:         Patient has prior history of Echocardiogram examinations, most recent 06/26/2019. Risk Factors:Former Smoker and Diabetes.  Sonographer:     Recardo Hedge RDCS Referring Phys:  [id number] MACKEY CHAD Diagnosing Phys: Jerel Balding MD  IMPRESSIONS   1. Left ventricular ejection fraction, by estimation, is 60 to 65%. The left ventricle has normal function. The left ventricle has no regional wall motion abnormalities. Left ventricular diastolic parameters  are indeterminate. The average left ventricular global longitudinal strain is -22.4 %. The global longitudinal strain is normal. 2. Right ventricular systolic function is normal. The right ventricular size is normal. 3. Left atrial size was mildly dilated. 4. The mitral valve is normal in structure. No evidence of mitral valve regurgitation. No evidence of mitral stenosis. 5. The aortic valve is normal in structure. Aortic valve regurgitation is not visualized. No aortic stenosis is present. 6. The inferior vena cava is normal in size with greater than 50% respiratory variability, suggesting right atrial pressure of 3 mmHg.  Comparison(s): No significant change from prior study. Prior images reviewed side by side.  FINDINGS Left Ventricle: Left ventricular ejection fraction, by estimation, is 60 to 65%. The left ventricle has normal function. The left ventricle has no  regional wall motion abnormalities. The average left ventricular global longitudinal strain is -22.4 %. The global longitudinal strain is normal. The left ventricular internal cavity size was normal in size. There is no left ventricular hypertrophy. Left ventricular diastolic parameters are indeterminate.  Right Ventricle: The right ventricular size is normal. No increase in right ventricular wall thickness. Right ventricular systolic function is normal.  Left Atrium: Left atrial size was mildly dilated.  Right Atrium: Right atrial size was normal in size.  Pericardium: There is no evidence of pericardial effusion.  Mitral Valve: The mitral valve is normal in structure. No evidence of mitral valve regurgitation. No evidence of mitral valve stenosis.  Tricuspid Valve: The tricuspid valve is normal in structure. Tricuspid valve regurgitation is not demonstrated. No evidence of tricuspid stenosis.  Aortic Valve: The aortic valve is normal in structure. Aortic valve regurgitation is not visualized. No aortic stenosis is present.  Pulmonic Valve: The pulmonic valve was normal in structure. Pulmonic valve regurgitation is not visualized. No evidence of pulmonic stenosis.  Aorta: The aortic root is normal in size and structure.  Venous: The inferior vena cava is normal in size with greater than 50% respiratory variability, suggesting right atrial pressure of 3 mmHg.  IAS/Shunts: No atrial level shunt detected by color flow Doppler.   LEFT VENTRICLE PLAX 2D LVIDd:         4.50 cm      Diastology LVIDs:         2.80 cm      LV e' medial:    7.18 cm/s LV PW:         1.00 cm      LV E/e' medial:  10.7 LV IVS:        1.00 cm      LV e' lateral:   9.68 cm/s LVOT diam:     2.10 cm      LV E/e' lateral: 8.0 LV SV:         80 LV SV Index:   39           2D Longitudinal Strain LVOT Area:     3.46 cm     2D Strain GLS Avg:     -22.4 %  LV Volumes (MOD) LV vol d, MOD A2C: 84.9 ml LV vol d, MOD  A4C: 103.0 ml LV vol s, MOD A2C: 29.5 ml LV vol s, MOD A4C: 45.9 ml LV SV MOD A2C:     55.4 ml LV SV MOD A4C:     103.0 ml LV SV MOD BP:      56.0 ml  RIGHT VENTRICLE RV S prime:     13.80 cm/s TAPSE (M-mode): 2.4  cm  LEFT ATRIUM           Index       RIGHT ATRIUM           Index LA diam:      4.10 cm 2.01 cm/m  RA Area:     12.30 cm LA Vol (A2C): 46.1 ml 22.65 ml/m RA Volume:   25.40 ml  12.48 ml/m LA Vol (A4C): 70.2 ml 34.50 ml/m AORTIC VALVE LVOT Vmax:   124.00 cm/s LVOT Vmean:  76.600 cm/s LVOT VTI:    0.231 m  AORTA Ao Root diam: 3.10 cm Ao Asc diam:  3.40 cm  MITRAL VALVE MV Area (PHT): 7.99 cm    SHUNTS MV Decel Time: 95 msec     Systemic VTI:  0.23 m MV E velocity: 77.10 cm/s  Systemic Diam: 2.10 cm MV A velocity: 70.30 cm/s MV E/A ratio:  1.10  Mihai Croitoru MD Electronically signed by Jerel Balding MD Signature Date/Time: 03/02/2021/1:03:03 PM    Final (Updated)    MONITORS  LONG TERM MONITOR (3-14 DAYS) 07/31/2019  Narrative  No significant arrhythmias detected  3 days of data recorded on Zio monitor. Patient had a min HR of 73 bpm, max HR of 130 bpm, and avg HR of 96 bpm. Predominant underlying rhythm was Sinus Rhythm. No VT, SVT, atrial fibrillation, high degree block, or pauses noted. Isolated atrial and ventricular ectopy was rare (<1%). There were 17 triggered events, which corresponded to sinus rhythm +/- PACs. No significant arrhythmias detected.       ______________________________________________________________________________________________      Risk Assessment/Calculations:   {Does this patient have ATRIAL FIBRILLATION?[phone]} No BP recorded.  {Refresh Note OR Click here to enter BP  :1}***        Physical Exam:     VS:  There were no vitals taken for this visit. ***    Wt Readings from Last 3 Encounters:  06/27/24 212 lb (96.2 kg)  05/16/24 208 lb 9.6 oz (94.6 kg)  04/10/24 208 lb 4.8 oz (94.5 kg)     GEN:  Well nourished, well developed, in no acute distress NECK: No JVD; No carotid bruits CARDIAC: ***RRR, no murmurs, rubs, gallops RESPIRATORY:  Clear to auscultation without rales, wheezing or rhonchi  ABDOMEN: Soft, non-tender, non-distended, normal bowel sounds EXTREMITIES:  Warm and well perfused, no edema; No deformity, 2+ radial pulses PSYCH: Normal mood and affect   Assessment & Plan       {Are you ordering a CV Procedure (e.g. stress test, cath, DCCV, TEE, etc)?   Press F2        :[id number]}   This note was written with the assistance of a dictation microphone or AI dictation software. Please excuse any typos or grammatical errors.   Signed, Georganna Archer, MD 07/15/2024 10:36 PM    [facility]  "

## 2024-07-16 ENCOUNTER — Ambulatory Visit
Attending: Student in an Organized Health Care Education/Training Program | Admitting: Student in an Organized Health Care Education/Training Program

## 2024-07-16 ENCOUNTER — Encounter: Payer: Self-pay | Admitting: Student in an Organized Health Care Education/Training Program

## 2024-07-16 VITALS — BP 102/70 | HR 92 | Ht 64.0 in | Wt 211.0 lb

## 2024-07-16 DIAGNOSIS — R002 Palpitations: Secondary | ICD-10-CM | POA: Diagnosis not present

## 2024-07-16 DIAGNOSIS — E1165 Type 2 diabetes mellitus with hyperglycemia: Secondary | ICD-10-CM | POA: Diagnosis not present

## 2024-07-16 NOTE — Assessment & Plan Note (Signed)
-   I recommended referral for GLP-1, but the patient says that she has tried this in the past and did not tolerate it. - She will continue working with her PCP regarding her blood glucose.

## 2024-07-16 NOTE — Patient Instructions (Signed)
 Medication Instructions:  Please try over the counter magnesium  glycinate.   *If you need a refill on your cardiac medications before your next appointment, please call your pharmacy*  Follow-Up: At [facility], you and your health needs are our priority.  As part of our continuing mission to provide you with exceptional heart care, our providers are all part of one team.  This team includes your primary Cardiologist (physician) and Advanced Practice Providers or APPs (Physician Assistants and Nurse Practitioners) who all work together to provide you with the care you need, when you need it.  Your next appointment:   As needed   Provider:   Georganna Archer, MD

## 2024-07-24 ENCOUNTER — Other Ambulatory Visit: Payer: Self-pay

## 2024-07-24 ENCOUNTER — Emergency Department (HOSPITAL_COMMUNITY)

## 2024-07-24 ENCOUNTER — Emergency Department (HOSPITAL_COMMUNITY)
Admission: EM | Admit: 2024-07-24 | Discharge: 2024-07-24 | Disposition: A | Discharge status: Home / Self Care | Attending: Emergency Medicine | Admitting: Emergency Medicine

## 2024-07-24 DIAGNOSIS — Z853 Personal history of malignant neoplasm of breast: Secondary | ICD-10-CM | POA: Insufficient documentation

## 2024-07-24 DIAGNOSIS — E119 Type 2 diabetes mellitus without complications: Secondary | ICD-10-CM | POA: Diagnosis not present

## 2024-07-24 DIAGNOSIS — Z7982 Long term (current) use of aspirin: Secondary | ICD-10-CM | POA: Diagnosis not present

## 2024-07-24 DIAGNOSIS — Z7984 Long term (current) use of oral hypoglycemic drugs: Secondary | ICD-10-CM | POA: Insufficient documentation

## 2024-07-24 DIAGNOSIS — R079 Chest pain, unspecified: Secondary | ICD-10-CM

## 2024-07-24 DIAGNOSIS — R0789 Other chest pain: Secondary | ICD-10-CM | POA: Diagnosis present

## 2024-07-24 LAB — CBC
HCT: 36.3 % (ref 36.0–46.0)
Hemoglobin: 11.6 g/dL — ABNORMAL LOW (ref 12.0–15.0)
MCH: 27.4 pg (ref 26.0–34.0)
MCHC: 32 g/dL (ref 30.0–36.0)
MCV: 85.6 fL (ref 80.0–100.0)
Platelets: 206 K/uL (ref 150–400)
RBC: 4.24 MIL/uL (ref 3.87–5.11)
RDW: 14.4 % (ref 11.5–15.5)
WBC: 6.1 K/uL (ref 4.0–10.5)
nRBC: 0 % (ref 0.0–0.2)

## 2024-07-24 LAB — BASIC METABOLIC PANEL WITH GFR
Anion gap: 11 (ref 5–15)
BUN: 13 mg/dL (ref 6–20)
CO2: 21 mmol/L — ABNORMAL LOW (ref 22–32)
Calcium: 9.1 mg/dL (ref 8.9–10.3)
Chloride: 106 mmol/L (ref 98–111)
Creatinine, Ser: 0.74 mg/dL (ref 0.44–1.00)
GFR, Estimated: >60 mL/min
Glucose, Bld: 164 mg/dL — ABNORMAL HIGH (ref 70–99)
Potassium: 4.6 mmol/L (ref 3.5–5.1)
Sodium: 138 mmol/L (ref 135–145)

## 2024-07-24 LAB — TROPONIN T, HIGH SENSITIVITY
Troponin T High Sensitivity: <15 ng/L (ref 0–19)
Troponin T High Sensitivity: <15 ng/L (ref 0–19)

## 2024-07-24 NOTE — ED Triage Notes (Addendum)
 Pt has c/o left side chest discomfort that radiates to left arm that started 4 hours ago. Pt took aspirin  this AM

## 2024-07-24 NOTE — ED Provider Notes (Signed)
 " [facility] AT [facility] Provider Note   CSN: [id number] Arrival date & time: 07/24/24  1205     Patient presents with: Chest Pain   Sandra Bishop is a 54 y.o. female.  Past medical history significant for diabetes, palpitations, and right sided breast cancer presents today for left-sided chest discomfort that radiated into her left arm that began earlier this morning.  Patient took aspirin  without relief.  Patient describes the pain as sharp and like a charley horse.  Pain is intermittent in nature.  Patient denies nausea, vomiting, fever, chills, trauma, numbness, weakness, shortness of breath, any other complaints at this time.  Patient currently being evaluated by cardiology for palpitations.    Chest Pain      Prior to Admission medications  Medication Sig Start Date End Date Taking? Authorizing Provider  ascorbic acid  (VITAMIN C) 500 MG tablet Take 1 tablet (500 mg total) by mouth daily. 03/14/22   Gudena, Vinay, MD  aspirin  EC 81 MG tablet Take 81 mg by mouth daily. Swallow whole.    [provider]  blood glucose meter kit and supplies Dispense based on patient and insurance preference. Use up to four times daily as directed. (FOR ICD-10 E10.9, E11.9).  ( One-Touch Verio meter) 03/21/19   Stallings, Zoe A, MD  Cholecalciferol (VITAMIN D3) 50 MCG (2000 UT) TABS Take 2,000 Units by mouth daily.    [provider]  empagliflozin  (JARDIANCE ) 25 MG TABS tablet Take 1 tablet (25 mg total) by mouth daily before breakfast. 04/02/24   Wheeler Harlene CROME, NP  fluticasone  (FLONASE ) 50 MCG/ACT nasal spray Place 2 sprays into both nostrils daily. Patient not taking: Reported on 07/16/2024 05/16/24   Yacopino, Jessica L, NP  glipiZIDE  (GLUCOTROL  XL) 2.5 MG 24 hr tablet Take 2.5 mg by mouth as needed.    [provider]  Lancets [facility] ULTRASOFT) lancets Use to check blood glucose three times daily. Use as instructed. E11.9.  03/05/19   Stallings, Zoe A, MD  loratadine  (CLARITIN ) 10 MG tablet Take 1 tablet (10 mg total) by mouth daily. 05/16/24   Wheeler Harlene CROME, NP  metFORMIN  (GLUCOPHAGE ) 500 MG tablet Take 1 tablet (500 mg total) by mouth 2 (two) times daily with a meal. Patient not taking: Reported on 07/16/2024 05/16/24   Wheeler Harlene CROME, NP  [facility] VERIO test strip TEST FOUR TIMES DAILY AS DIRECTED 08/13/19   Stallings, Zoe A, MD  rosuvastatin  (CRESTOR ) 5 MG tablet Take 5 mg by mouth every Tuesday. Patient not taking: Reported on 07/16/2024    [provider]  tamoxifen  (NOLVADEX ) 20 MG tablet TAKE 1 TABLET(20 MG) BY MOUTH DAILY 07/30/23   Gudena, Vinay, MD    Allergies: Patient has no known allergies.    Review of Systems  Cardiovascular:  Positive for chest pain.    Updated Vital Signs BP (!) 132/94 (BP Location: Left Arm)   Pulse 86   Temp 98.3 F (36.8 C) (Oral)   Resp 17   SpO2 97%   Physical Exam Vitals and nursing note reviewed.  Constitutional:      General: She is not in acute distress.    Appearance: She is well-developed. She is not toxic-appearing.  HENT:     Head: Normocephalic and atraumatic.  Eyes:     Extraocular Movements: Extraocular movements intact.     Conjunctiva/sclera: Conjunctivae normal.  Cardiovascular:     Rate and Rhythm: Normal rate and regular rhythm.  Heart sounds: Normal heart sounds. No murmur heard. Pulmonary:     Effort: Pulmonary effort is normal. No respiratory distress.     Breath sounds: Normal breath sounds.  Abdominal:     Palpations: Abdomen is soft.     Tenderness: There is no abdominal tenderness.  Musculoskeletal:        General: No swelling.     Cervical back: Neck supple.     Right lower leg: No edema.     Left lower leg: No edema.  Skin:    General: Skin is warm and dry.     Capillary Refill: Capillary refill takes less than 2 seconds.  Neurological:     General: No focal deficit present.     Mental Status: She is  alert and oriented to person, place, and time.     Motor: No weakness.  Psychiatric:        Mood and Affect: Mood normal.     (all labs ordered are listed, but only abnormal results are displayed) Labs Reviewed  BASIC METABOLIC PANEL WITH GFR - Abnormal; Notable for the following components:      Result Value   CO2 21 (*)    Glucose, Bld 164 (*)    All other components within normal limits  CBC - Abnormal; Notable for the following components:   Hemoglobin 11.6 (*)    All other components within normal limits  TROPONIN T, HIGH SENSITIVITY  TROPONIN T, HIGH SENSITIVITY    EKG: EKG Interpretation Date/Time:  Thursday July 24 2024 12:19:41 EST Ventricular Rate:  85 PR Interval:  144 QRS Duration:  95 QT Interval:  346 QTC Calculation: 412 R Axis:   21  Text Interpretation: Sinus rhythm Low voltage, precordial leads Borderline T wave abnormalities Confirmed by Mannie Pac [phone]) on 07/24/2024 1:59:25 PM  Radiology: [facility] Chest 2 View Result Date: 07/24/2024 CLINICAL DATA:  Chest pain EXAM: CHEST - 2 VIEW COMPARISON:  July 08, 2021 FINDINGS: The heart size and mediastinal contours are within normal limits. Both lungs are clear. The visualized skeletal structures are unremarkable. IMPRESSION: No active cardiopulmonary disease. Electronically Signed   By: Lynwood Landy Raddle M.D.   On: 07/24/2024 13:18     Procedures   Medications Ordered in the ED - No data to display                                  Medical Decision Making Amount and/or Complexity of Data Reviewed Labs: ordered. Radiology: ordered.   This patient presents to the ED for concern of chest/arm pain, this involves an extensive number of treatment options, and is a complaint that carries with it a high risk of complications and morbidity.  The differential diagnosis includes musculoskeletal pain, STEMI, NSTEMI, arrhythmia, anemia, electrolyte abnormality   Co morbidities / Chronic conditions that  complicate the patient evaluation  Diabetes   Additional history obtained:  Additional history obtained from EMR External records from outside source obtained and reviewed including cardiology notes   Lab Tests:  I Ordered, and personally interpreted labs.  The pertinent results include: Mildly reduced CO2 at 21, delta troponin less than 15, mild anemia at 11.6,   Imaging Studies ordered:  I ordered imaging studies including chest x-ray I independently visualized and interpreted imaging which showed no active cardiopulmonary disease I agree with the radiologist interpretation   Cardiac Monitoring: / EKG:  The patient was maintained on a  cardiac monitor.  I personally viewed and interpreted the cardiac monitored which showed an underlying rhythm of: sinus rhythm, borderline T wave abnormalities  Test / Admission - Considered:  Considered for admission or further workup however patient's vital signs, physical exam, labs, and imaging are reassuring.  Patient's symptoms likely due to musculoskeletal pain given patient's description of symptoms.  Patient vies to follow-up with PCP and cardiology if her symptoms persist for further evaluation and workup.  Patient given return precautions.  I feel patient is safe for discharge at this time.     Final diagnoses:  Chest pain, unspecified type    ED Discharge Orders     None          Francis Ileana LOISE DEVONNA 07/24/24 1725    Tegeler, Lonni PARAS, MD 07/24/24 2346  "

## 2024-07-24 NOTE — Discharge Instructions (Addendum)
 Today you were seen for chest pain.  Your workup on the emergency department was reassuring.  These follow-up with cardiology in the next several days.  You may alternate Tylenol  and Motrin  as needed for pain.  Thank you for letting us  treat you today. After reviewing your labs and imaging, I feel you are safe to go home. Please follow up with your PCP in the next several days and provide them with your records from this visit. Return to the Emergency Room if pain becomes severe or symptoms worsen.

## 2024-08-04 ENCOUNTER — Other Ambulatory Visit: Payer: Self-pay | Admitting: *Deleted

## 2024-08-04 DIAGNOSIS — E1165 Type 2 diabetes mellitus with hyperglycemia: Secondary | ICD-10-CM

## 2024-08-04 MED ORDER — EMPAGLIFLOZIN 25 MG PO TABS: 25.0000 mg | ORAL_TABLET | Freq: Every day | ORAL | 2 refills | Status: AC

## 2024-08-14 ENCOUNTER — Other Ambulatory Visit: Payer: Self-pay | Admitting: Hematology and Oncology

## 2024-12-26 ENCOUNTER — Ambulatory Visit: Admitting: Student

## 2025-04-13 ENCOUNTER — Ambulatory Visit: Admitting: Hematology and Oncology
